# Patient Record
Sex: Female | Born: 1960 | Race: White | Hispanic: No | Marital: Married | Smoking: Never smoker
Health system: Southern US, Community
[De-identification: ages and names within clinical notes are randomized; demographics above are authoritative.]

## PROBLEM LIST (undated history)

## (undated) DIAGNOSIS — J349 Unspecified disorder of nose and nasal sinuses: Secondary | ICD-10-CM

## (undated) DIAGNOSIS — N6019 Diffuse cystic mastopathy of unspecified breast: Secondary | ICD-10-CM

## (undated) DIAGNOSIS — C801 Malignant (primary) neoplasm, unspecified: Secondary | ICD-10-CM

## (undated) DIAGNOSIS — E119 Type 2 diabetes mellitus without complications: Secondary | ICD-10-CM

## (undated) DIAGNOSIS — J45909 Unspecified asthma, uncomplicated: Secondary | ICD-10-CM

## (undated) DIAGNOSIS — Z9221 Personal history of antineoplastic chemotherapy: Secondary | ICD-10-CM

## (undated) DIAGNOSIS — Z464 Encounter for fitting and adjustment of orthodontic device: Secondary | ICD-10-CM

## (undated) DIAGNOSIS — Z803 Family history of malignant neoplasm of breast: Secondary | ICD-10-CM

## (undated) DIAGNOSIS — J9819 Other pulmonary collapse: Secondary | ICD-10-CM

## (undated) DIAGNOSIS — Z800 Family history of malignant neoplasm of digestive organs: Secondary | ICD-10-CM

## (undated) DIAGNOSIS — K7460 Unspecified cirrhosis of liver: Secondary | ICD-10-CM

## (undated) DIAGNOSIS — M1990 Unspecified osteoarthritis, unspecified site: Secondary | ICD-10-CM

## (undated) DIAGNOSIS — C50919 Malignant neoplasm of unspecified site of unspecified female breast: Secondary | ICD-10-CM

## (undated) DIAGNOSIS — E8801 Alpha-1-antitrypsin deficiency: Secondary | ICD-10-CM

## (undated) DIAGNOSIS — IMO0001 Reserved for inherently not codable concepts without codable children: Secondary | ICD-10-CM

## (undated) DIAGNOSIS — T753XXA Motion sickness, initial encounter: Secondary | ICD-10-CM

## (undated) HISTORY — PX: INCISION AND DRAINAGE: SHX5863

## (undated) HISTORY — DX: Malignant (primary) neoplasm, unspecified: C80.1

## (undated) HISTORY — PX: OTHER SURGICAL HISTORY: SHX169

## (undated) HISTORY — PX: BREAST SURGERY: SHX581

## (undated) HISTORY — DX: Diffuse cystic mastopathy of unspecified breast: N60.19

## (undated) HISTORY — DX: Unspecified asthma, uncomplicated: J45.909

## (undated) HISTORY — DX: Family history of malignant neoplasm of digestive organs: Z80.0

## (undated) HISTORY — DX: Other pulmonary collapse: J98.19

## (undated) HISTORY — DX: Type 2 diabetes mellitus without complications: E11.9

## (undated) HISTORY — DX: Family history of malignant neoplasm of breast: Z80.3

## (undated) HISTORY — DX: Malignant neoplasm of unspecified site of unspecified female breast: C50.919

## (undated) HISTORY — DX: Alpha-1-antitrypsin deficiency: E88.01

## (undated) HISTORY — DX: Unspecified osteoarthritis, unspecified site: M19.90

## (undated) HISTORY — DX: Unspecified disorder of nose and nasal sinuses: J34.9

## (undated) HISTORY — PX: CRYOABLATION: SHX1415

---

## 1988-05-12 HISTORY — PX: CHOLECYSTECTOMY: SHX55

## 2000-08-11 ENCOUNTER — Ambulatory Visit (HOSPITAL_COMMUNITY): Admission: RE | Admit: 2000-08-11 | Discharge: 2000-08-11 | Payer: Self-pay | Admitting: Neurology

## 2000-08-11 ENCOUNTER — Encounter: Payer: Self-pay | Admitting: Neurology

## 2001-05-12 HISTORY — PX: BREAST SURGERY: SHX581

## 2002-07-11 HISTORY — PX: BREAST BIOPSY: SHX20

## 2003-05-13 HISTORY — PX: BREAST BIOPSY: SHX20

## 2004-04-19 ENCOUNTER — Ambulatory Visit: Payer: Self-pay | Admitting: Unknown Physician Specialty

## 2004-04-22 ENCOUNTER — Ambulatory Visit: Payer: Self-pay

## 2004-05-02 ENCOUNTER — Ambulatory Visit: Payer: Self-pay | Admitting: Urology

## 2004-05-12 DIAGNOSIS — E119 Type 2 diabetes mellitus without complications: Secondary | ICD-10-CM

## 2004-05-12 DIAGNOSIS — C801 Malignant (primary) neoplasm, unspecified: Secondary | ICD-10-CM

## 2004-05-12 DIAGNOSIS — M1990 Unspecified osteoarthritis, unspecified site: Secondary | ICD-10-CM

## 2004-05-12 HISTORY — DX: Type 2 diabetes mellitus without complications: E11.9

## 2004-05-12 HISTORY — DX: Malignant (primary) neoplasm, unspecified: C80.1

## 2004-05-12 HISTORY — DX: Unspecified osteoarthritis, unspecified site: M19.90

## 2004-05-17 ENCOUNTER — Other Ambulatory Visit: Payer: Self-pay

## 2004-06-11 ENCOUNTER — Inpatient Hospital Stay: Payer: Self-pay | Admitting: Urology

## 2004-07-02 ENCOUNTER — Ambulatory Visit: Payer: Self-pay | Admitting: Urology

## 2004-08-16 ENCOUNTER — Ambulatory Visit: Payer: Self-pay | Admitting: Urology

## 2004-11-20 ENCOUNTER — Ambulatory Visit: Payer: Self-pay | Admitting: Unknown Physician Specialty

## 2005-05-12 DIAGNOSIS — J9819 Other pulmonary collapse: Secondary | ICD-10-CM

## 2005-05-12 HISTORY — DX: Other pulmonary collapse: J98.19

## 2005-06-09 ENCOUNTER — Ambulatory Visit: Payer: Self-pay | Admitting: Urology

## 2005-11-10 ENCOUNTER — Ambulatory Visit: Payer: Self-pay | Admitting: Urology

## 2005-12-04 ENCOUNTER — Ambulatory Visit: Payer: Self-pay

## 2006-04-17 ENCOUNTER — Ambulatory Visit: Payer: Self-pay | Admitting: Urology

## 2006-06-30 ENCOUNTER — Ambulatory Visit: Payer: Self-pay | Admitting: Internal Medicine

## 2006-10-16 ENCOUNTER — Ambulatory Visit: Payer: Self-pay | Admitting: Urology

## 2007-04-19 ENCOUNTER — Ambulatory Visit: Payer: Self-pay | Admitting: Urology

## 2007-05-13 HISTORY — PX: BREAST SURGERY: SHX581

## 2007-06-24 ENCOUNTER — Ambulatory Visit: Payer: Self-pay | Admitting: Internal Medicine

## 2007-07-20 ENCOUNTER — Ambulatory Visit: Payer: Self-pay

## 2007-07-22 ENCOUNTER — Ambulatory Visit: Payer: Self-pay

## 2007-08-23 HISTORY — PX: BREAST BIOPSY: SHX20

## 2008-02-18 ENCOUNTER — Ambulatory Visit: Payer: Self-pay | Admitting: Urology

## 2008-07-19 ENCOUNTER — Ambulatory Visit: Payer: Self-pay | Admitting: Family Medicine

## 2008-08-02 ENCOUNTER — Ambulatory Visit: Payer: Self-pay | Admitting: Family Medicine

## 2008-08-21 ENCOUNTER — Ambulatory Visit: Payer: Self-pay | Admitting: Urology

## 2009-02-21 ENCOUNTER — Ambulatory Visit: Payer: Self-pay | Admitting: Urology

## 2009-02-28 ENCOUNTER — Ambulatory Visit: Payer: Self-pay | Admitting: Urology

## 2009-04-03 ENCOUNTER — Ambulatory Visit: Payer: Self-pay | Admitting: Urology

## 2009-05-24 ENCOUNTER — Ambulatory Visit: Payer: Self-pay | Admitting: Urology

## 2009-07-25 ENCOUNTER — Ambulatory Visit: Payer: Self-pay | Admitting: Internal Medicine

## 2009-09-24 ENCOUNTER — Ambulatory Visit: Payer: Self-pay | Admitting: Urology

## 2010-04-10 ENCOUNTER — Ambulatory Visit: Payer: Self-pay | Admitting: Urology

## 2010-07-29 ENCOUNTER — Ambulatory Visit: Payer: Self-pay | Admitting: Internal Medicine

## 2010-10-14 ENCOUNTER — Ambulatory Visit: Payer: Self-pay | Admitting: Urology

## 2011-04-23 ENCOUNTER — Ambulatory Visit: Payer: Self-pay | Admitting: Urology

## 2011-10-11 HISTORY — PX: BREAST SURGERY: SHX581

## 2011-10-11 HISTORY — PX: CYST REMOVAL NECK: SHX6281

## 2011-10-21 ENCOUNTER — Ambulatory Visit: Payer: Self-pay

## 2011-10-23 ENCOUNTER — Ambulatory Visit: Payer: Self-pay

## 2011-10-29 ENCOUNTER — Ambulatory Visit: Payer: Self-pay | Admitting: Anesthesiology

## 2011-10-29 DIAGNOSIS — I10 Essential (primary) hypertension: Secondary | ICD-10-CM

## 2011-10-31 ENCOUNTER — Ambulatory Visit: Payer: Self-pay | Admitting: Unknown Physician Specialty

## 2011-11-05 ENCOUNTER — Ambulatory Visit: Payer: Self-pay | Admitting: Urology

## 2012-04-27 ENCOUNTER — Ambulatory Visit: Payer: Self-pay | Admitting: General Surgery

## 2012-06-25 DIAGNOSIS — C649 Malignant neoplasm of unspecified kidney, except renal pelvis: Secondary | ICD-10-CM | POA: Insufficient documentation

## 2012-06-25 DIAGNOSIS — N393 Stress incontinence (female) (male): Secondary | ICD-10-CM | POA: Insufficient documentation

## 2012-07-02 ENCOUNTER — Ambulatory Visit: Payer: Self-pay | Admitting: Urology

## 2012-12-29 ENCOUNTER — Ambulatory Visit: Payer: Self-pay | Admitting: Urology

## 2013-08-26 ENCOUNTER — Ambulatory Visit: Payer: Self-pay | Admitting: Gastroenterology

## 2013-08-26 HISTORY — PX: COLONOSCOPY: SHX174

## 2013-09-29 ENCOUNTER — Telehealth: Payer: Self-pay | Admitting: *Deleted

## 2013-09-29 NOTE — Telephone Encounter (Signed)
Pt called and had an appt at [facility] for a mammo and when she got there for her appt she was told she needs to have an order from her dr to do a diagnositc- she stated that Dr.Byrnett had ordered a mammo in the past (2013) she has had many cyst in the past and now has another one that has came up, and she is really worried about it. She was wondering can we order her another mammo and then come in to see Dr.Byrnett.

## 2013-10-11 ENCOUNTER — Encounter: Payer: Self-pay | Admitting: General Surgery

## 2013-10-11 ENCOUNTER — Ambulatory Visit: Payer: Self-pay | Admitting: General Surgery

## 2013-10-12 ENCOUNTER — Encounter: Payer: Self-pay | Admitting: *Deleted

## 2013-10-13 ENCOUNTER — Ambulatory Visit (INDEPENDENT_AMBULATORY_CARE_PROVIDER_SITE_OTHER): Payer: BC Managed Care – PPO | Admitting: General Surgery

## 2013-10-13 ENCOUNTER — Other Ambulatory Visit: Payer: Self-pay | Admitting: General Surgery

## 2013-10-13 ENCOUNTER — Encounter: Payer: Self-pay | Admitting: General Surgery

## 2013-10-13 ENCOUNTER — Ambulatory Visit: Payer: BC Managed Care – PPO

## 2013-10-13 VITALS — BP 130/78 | HR 76 | Resp 12 | Ht 66.0 in | Wt 198.0 lb

## 2013-10-13 DIAGNOSIS — N63 Unspecified lump in unspecified breast: Secondary | ICD-10-CM

## 2013-10-13 NOTE — Progress Notes (Signed)
Patient ID: Danielle Wallace, female   DOB: [birth year], 53 y.o.   MRN: [id number]  Chief Complaint  Patient presents with  . Other    mammogram    HPI Danielle Wallace is a 53 y.o. female who presents for a breast evaluation. The most recent mammogram was done on 10/11/13. Patient states she feels lump in her left breast.She states the she noticed the area about two months ago and thinks it is getting bigger. There area is tender to touch but no pain.Patient does perform regular self breast checks and does not  gets regular mammograms done.   The patient was last seen in this office in January 2014 having undergone aspiration of large symptomatic cyst of the left breast in June of the following year. Cytology of those samples have been negative.    HPI  Past Medical History  Diagnosis Date  . Asthma   . Collapsed lung 2007  . Diffuse cystic mastopathy   . Arthritis 2006  . Diabetes mellitus without complication 6440  . Sinus problem   . Cancer 2006    Renal cell carcinoma; cryosurgery treatment    Past Surgical History  Procedure Laterality Date  . Cesarean section  1991  . Cyst removal neck  10/2011    Dr. Tami Ribas  . Cholecystectomy  1990  . Cryoablation  2005, 2010  . Breast surgery Left 10/2011    Cyst Aspirationapocrine metaplasia, ductal cells and bone cells, hypo-cellular  . Breast surgery Left 2009    ADH on stereotactic biopsy.  . Breast surgery Left 2003    fibrocystic changes with ductal hyperplasia without atypia.    Family History  Problem Relation Age of Onset  . Breast cancer Paternal Aunt   . Ovarian cancer Maternal Grandmother   . Liver cancer Father   . Diabetes Mother   . Hypertension Mother     Social History History  Substance Use Topics  . Smoking status: Never Smoker   . Smokeless tobacco: Never Used  . Alcohol Use: Yes     Comment: occasionally    Allergies  Allergen Reactions  . Floxin [Ofloxacin] Other (See Comments)    systemic  .  Hydrocodone Itching  . Sulfa Antibiotics Other (See Comments)    blisters  . Penicillins Rash    Current Outpatient Prescriptions  Medication Sig Dispense Refill  . albuterol (PROVENTIL HFA;VENTOLIN HFA) 108 (90 BASE) MCG/ACT inhaler Inhale 2 puffs into the lungs every 6 (six) hours as needed for wheezing or shortness of breath.      . Canagliflozin (INVOKANA) 300 MG TABS Take 1 tablet by mouth daily.      . fluticasone (VERAMYST) 27.5 MCG/SPRAY nasal spray Place 2 sprays into the nose daily.      Marland Kitchen imipramine (TOFRANIL) 25 MG tablet Take 25 mg by mouth at bedtime.      . meloxicam (MOBIC) 7.5 MG tablet Take 7.5 mg by mouth 2 (two) times daily.      . sitaGLIPtin (JANUVIA) 50 MG tablet Take 50 mg by mouth daily.      . valsartan-hydrochlorothiazide (DIOVAN-HCT) 80-12.5 MG per tablet Take 1 tablet by mouth daily.       No current facility-administered medications for this visit.    Review of Systems Review of Systems  Constitutional: Negative.   Respiratory: Negative.   Cardiovascular: Negative.     Blood pressure 130/78, pulse 76, resp. rate 12, height 5' 6"  (1.676 m), weight 198 lb (89.812 kg).  Physical  Exam Physical Exam  Constitutional: She is oriented to person, place, and time. She appears well-developed and well-nourished.  Eyes: Conjunctivae are normal. No scleral icterus.  Neck: Neck supple.  Cardiovascular: Normal rate, regular rhythm and normal heart sounds.   Pulmonary/Chest: Breath sounds normal. Right breast exhibits no inverted nipple, no mass, no nipple discharge, no skin change and no tenderness. Left breast exhibits mass ( left breast 2 o'clcock 9 cm from nipple 1cm smooth nodular, 12 o'clcok 4cm thickening ). Left breast exhibits no inverted nipple, no nipple discharge, no skin change and no tenderness. Breasts are asymmetrical (left breast half cup sizes smaller than right ).  Lymphadenopathy:    She has no cervical adenopathy.    She has no axillary  adenopathy.  Neurological: She is alert and oriented to person, place, and time.  Skin: Skin is warm and dry.    Data Reviewed REASON FOR EXAM:    LT BR CYST AND YRLY COMMENTS:     PROCEDURE: MAM - MAM [place] LIMITED LEFT BREAST  - Oct 11 2013  3:35PM   CLINICAL DATA:  The patient questions palpable masses left breast 12 o'clock location and upper-outer quadrant, respectively. Previous left cyst aspiration.  EXAM: DIGITAL DIAGNOSTIC  bilateral MAMMOGRAM WITH CAD  ULTRASOUND left BREAST  COMPARISON:  Prior exams ACR Breast Density Category b: There are scattered areas of fibroglandular density.  FINDINGS: No new suspicious abnormality is identified in the left breast. The previous identified cyst in the left upper outer quadrant is no longer identified, there is new masslike distortion in the left breast 2 o'clock location at the site of the more posterior questioned palpable finding.  Mammographic images were processed with CAD.  On physical exam, I palpate masslike thickening in the left breast 2 o'clock location. I palpate a probable ridge of tissue in the left breast 12 o'clock location middle depth. Ultrasound is performed, showing :  Irregular shadowing hypoechoic mass left breast 2 o'clock location 6 cm measuring 1.1 x 1.1 x 1.0 cm.  In the left breast 12 o'clock location, no sonographic abnormality is identified at the site of the questioned palpable finding.  No left axillary lymphadenopathy is identified.   IMPRESSION: Suspicious mass left breast 2 o'clock location at the site of the patient's questioned palpable finding. The primary differential consideration includes invasive mammary carcinoma, although scar tissue could conceivably appear similar but is less likely in the setting of needle aspiration of a simple appearing cyst previously.  No corresponding suspicious abnormality in the left breast 12 o'clock location. Any further management of this area  should be dictated by clinical assessment.  No evidence for malignancy in the right breast.  RECOMMENDATION: Left ultrasound-guided core biopsy, 2 o'clock location.  I discussed these findings with the patient and provided them in written form and also called these findings to Dr. Bary Castilla on 10/11/2013 at 3:35 p.m.. His office will contact the patient to arrange for further followup. I have discussed the findings and recommendations with the patient. Results were also provided in writing at the conclusion of the visit. If applicable, a reminder letter will be sent to the patient regarding the next appointment.  BI-RADS CATEGORY  4: Suspicious.   Electronically Signed   By: Conchita Paris M.D.   On: 10/11/2013 16:24     Assessment     New left breast mass.     Plan    Considering the marked interval change both on mammogram and ultrasound vacuum-assisted biopsy  was recommended.  Ultrasound of the right breast in the 2 o'clock position 8 cm from the nipple confirmed a 1.0 x 1.04 x 1.11 cm hypoechoic mass with dense acoustic shadowing at the area of palpable thickening. 10 cc of 0.5% Xylocaine with 0.25% Marcaine with 1-200,000 units of epinephrine was instilled well-tolerated. ChloraPrep was applied to the skin. A 10-gauge Encor device was used with 9 core samples obtained. Scant bleeding was noted. A postbiopsy clip was placed. Skin defect was closed with benzoin and Steri-Strips followed by Telfa and Tegaderm dressing.  Written instructions were provided for postbiopsy wound care.  The patient will be contacted when biopsy results are available and further plans made from that point.     PCP: Clayborn Bigness Kirke Shaggy Kiara Mcdowell 10/14/2013, 8:34 PM

## 2013-10-13 NOTE — Patient Instructions (Addendum)
CARE AFTER BREAST BIOPSY  1. Leave the dressing on that your doctor applied after surgery. It is waterproof. You may bathe, shower and/or swim. The dressing will probably remain intact until your return office visit. If the dressing comes off, you will see small strips of tape against your skin on the incision. Do not remove these strips.  2. You may want to use a gauze,cloth or similar protection in your bra to prevent rubbing against your dressing and incision. This is not necessary, but you may feel more comfortable doing so.  3. It is recommended that you wear a bra day and night to give support to the breast. This will prevent the weight of the breast from pulling on the incision.  4. Your breast will feel hard and lumpy under the incision. Do not be alarmed. This is the underlying stitching of tissue. Softening of this tissue will occur in time.  5. Make sure you call the office and schedule an appointment in one week after your surgery. The office phone number is [phone]. The nurses at [facility] may have already done this for you.  6. You will notice about a week after your office visit that the strips of the tape on your incision will begin to loosen. These may then be removed.  7. Report to your doctor any of the following:  * Severe pain not relieved by your pain medication  *Redness of the incision  * Drainage from the incision  *Fever greater than 101 degrees 1 week nurse

## 2013-10-14 ENCOUNTER — Encounter: Payer: Self-pay | Admitting: General Surgery

## 2013-10-14 ENCOUNTER — Telehealth: Payer: Self-pay | Admitting: General Surgery

## 2013-10-14 ENCOUNTER — Other Ambulatory Visit: Payer: Self-pay

## 2013-10-14 DIAGNOSIS — C50919 Malignant neoplasm of unspecified site of unspecified female breast: Secondary | ICD-10-CM

## 2013-10-14 DIAGNOSIS — C50412 Malignant neoplasm of upper-outer quadrant of left female breast: Secondary | ICD-10-CM | POA: Insufficient documentation

## 2013-10-14 DIAGNOSIS — Z170 Estrogen receptor positive status [ER+]: Secondary | ICD-10-CM

## 2013-10-14 DIAGNOSIS — N63 Unspecified lump in unspecified breast: Secondary | ICD-10-CM | POA: Insufficient documentation

## 2013-10-14 HISTORY — DX: Malignant neoplasm of unspecified site of unspecified female breast: C50.919

## 2013-10-14 NOTE — Progress Notes (Signed)
Patient ID: Danielle Wallace, female   DOB: [birth year], 53 y.o.   MRN: [id number]   The patient returns today accompanied by her husband to review results of her biopsy completed yesterday. She had been notified by phone that the cancer had been identified.  She reports minimal discomfort from the biopsy site and no significant bruising.  She brought with her today copy of her recent laboratory studies. These were completed Sep 20, 2013.  Renal function is preserved with a creatinine of 0.7. Liver function studies are notable for a mild elevation of the SGPT at 45 (0-32). Remaining liver function studies are normal. Hemoglobin A1c was mildly elevated at 6.2%. Normal vitamin D, normal TSH. Normal cholesterol. CBC showed a hemoglobin of 15.7 with MCV of 89 and white blood cell count 7300.  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 Review of her medical record shows multiple previous cyst aspirations and in 2009 and isolated biopsy showing ADH. At that time the estimated wrist reduction with the use of chemoprevention was 1.4% which was not felt to be an adequate improvement to initiate tamoxifen.  Breast conservation and mastectomy were presented is equivalent procedures in terms of local control. The indications for adjuvant chemotherapy would be based on final tumor size, lymph node status and likely Oncotype DX testing.  The patient expressed an interest in mastectomy with immediate reconstruction, based on the number of previous cyst aspiration she is undergone and a concern that each time she felt a new lesion she will be worried about recurrent cancer.  Informational brochures were provided regarding breast reconstruction and she was encouraged to sit down with the plastic surgeon to review options that white be particularly appropriate for her in light of her diabetes and modest obesity.  A general  informational brochure regarding breast cancer treatment was provided.  The patient has a planned trip to [place] scheduled for June 21-24 of this year. I think is very reasonable for her to plan on taking this trip. Ideally, arrangements will made for plastic surgery evaluation before her travels and then surgical therapy shortly after her return.  She will obtain a CEA and CA 27-29 today.  She was encouraged to call with any questions.  She was offered the opportunity for second surgical opinion and to meet with medical oncology/radiation oncology if she felt this would be appropriate to her situation. At this time, she declines.  We'll work to arrange for plastic surgery consultation in the near future.

## 2013-10-14 NOTE — Telephone Encounter (Signed)
She was notified to yesterday's biopsy confirmed a breast cancer. Arrangements are in place to meet at 3 PM today to review options.

## 2013-10-15 LAB — CEA: CEA: 1.8 ng/mL (ref 0.0–4.7)

## 2013-10-15 LAB — CANCER ANTIGEN 27.29: CA 27.29: 29.7 U/mL (ref 0.0–38.6)

## 2013-10-16 ENCOUNTER — Encounter: Payer: Self-pay | Admitting: General Surgery

## 2013-10-16 ENCOUNTER — Telehealth: Payer: Self-pay | Admitting: General Surgery

## 2013-10-16 NOTE — Telephone Encounter (Signed)
Message left that CEA and CA 27-29 were normal.

## 2013-10-20 ENCOUNTER — Ambulatory Visit (INDEPENDENT_AMBULATORY_CARE_PROVIDER_SITE_OTHER): Payer: BC Managed Care – PPO | Admitting: *Deleted

## 2013-10-20 ENCOUNTER — Ambulatory Visit: Payer: BC Managed Care – PPO

## 2013-10-20 DIAGNOSIS — C50419 Malignant neoplasm of upper-outer quadrant of unspecified female breast: Secondary | ICD-10-CM

## 2013-10-20 DIAGNOSIS — C50412 Malignant neoplasm of upper-outer quadrant of left female breast: Secondary | ICD-10-CM

## 2013-10-20 NOTE — Patient Instructions (Signed)
The patient is aware to call back for any questions or concerns.  

## 2013-10-20 NOTE — Progress Notes (Signed)
Patient here today for follow up post left breast biopsy.  No dressing or steristrip.  Minimal bruising noted.  The patient is aware that a heating pad may be used for comfort as needed.  She had her appointment with Dr. Tula Nakayama today and wants to talk to Dr. Bary Castilla about surgical options (? bilateral mastectomy with reconstruction). She is possibly wanting to have surgery after her trip to [place] (21-24) so this would tentatively be the first part of July. I told her we would be in touch with her regarding an appointment with Dr Bary Castilla next week.

## 2013-10-21 LAB — PATHOLOGY

## 2013-10-24 ENCOUNTER — Telehealth: Payer: Self-pay | Admitting: General Surgery

## 2013-10-24 HISTORY — PX: BREAST BIOPSY: SHX20

## 2013-10-24 NOTE — Telephone Encounter (Signed)
Reviewed discussion at this evenings tumor board meeting. Because of her 2 + tumor, patient will likely benefit from adjuvant chemo.  She does not qualify for protocol treatment for her 2 + tumor as it is less than 2 cm, but she might be considered for treatment off protocol.  Will arrange an appt w/ Dr. Oliva Bustard from Med Onc this week, and an OV after her return from [facility] to discuss chemo and roll of prophylactic mastectomy (discouraged based on current data).

## 2013-10-25 ENCOUNTER — Telehealth: Payer: Self-pay

## 2013-10-25 NOTE — Telephone Encounter (Signed)
Message copied by Lesly Rubenstein on Tue Oct 25, 2013  8:37 AM ------      Message from: [place], Forest Gleason      Created: Mon Oct 24, 2013  7:07 PM       Please make arrangements for the patient to meet with Dr. Oliva Bustard this week re: HER 2 + breast cancer, and with me next week to discuss options for treatment and bilateral mastectomy. Thanks ------

## 2013-10-25 NOTE — Telephone Encounter (Signed)
Spoke with patient about seeing Dr Oliva Bustard this week, patient is amendable to this. Patient is schedule to see Dr Oliva Bustard at [facility] on 10/27/13 at 8:00 am. Patient is aware of date and time.

## 2013-10-27 ENCOUNTER — Ambulatory Visit: Payer: Self-pay | Admitting: Oncology

## 2013-10-27 ENCOUNTER — Telehealth: Payer: Self-pay | Admitting: *Deleted

## 2013-10-27 ENCOUNTER — Encounter: Payer: Self-pay | Admitting: *Deleted

## 2013-10-27 LAB — COMPREHENSIVE METABOLIC PANEL
ALBUMIN: 3.8 g/dL (ref 3.4–5.0)
ALT: 43 U/L (ref 12–78)
ANION GAP: 6 — AB (ref 7–16)
Alkaline Phosphatase: 78 U/L
BILIRUBIN TOTAL: 0.5 mg/dL (ref 0.2–1.0)
BUN: 21 mg/dL — AB (ref 7–18)
CHLORIDE: 101 mmol/L (ref 98–107)
Calcium, Total: 9.6 mg/dL (ref 8.5–10.1)
Co2: 35 mmol/L — ABNORMAL HIGH (ref 21–32)
Creatinine: 0.65 mg/dL (ref 0.60–1.30)
EGFR (Non-African Amer.): >60
GLUCOSE: 115 mg/dL — AB (ref 65–99)
Osmolality: 287 (ref 275–301)
Potassium: 4.1 mmol/L (ref 3.5–5.1)
SGOT(AST): 26 U/L (ref 15–37)
Sodium: 142 mmol/L (ref 136–145)
Total Protein: 7.6 g/dL (ref 6.4–8.2)

## 2013-10-27 LAB — CBC CANCER CENTER
Basophil #: 0 x10 3/mm (ref 0.0–0.1)
Basophil %: 0.6 %
EOS PCT: 2.2 %
Eosinophil #: 0.1 x10 3/mm (ref 0.0–0.7)
HCT: 46.3 % (ref 35.0–47.0)
HGB: 15.4 g/dL (ref 12.0–16.0)
LYMPHS ABS: 2 x10 3/mm (ref 1.0–3.6)
Lymphocyte %: 30.7 %
MCH: 29.7 pg (ref 26.0–34.0)
MCHC: 33.2 g/dL (ref 32.0–36.0)
MCV: 89 fL (ref 80–100)
Monocyte #: 0.5 x10 3/mm (ref 0.2–0.9)
Monocyte %: 7.9 %
NEUTROS ABS: 3.8 x10 3/mm (ref 1.4–6.5)
NEUTROS PCT: 58.6 %
PLATELETS: 256 x10 3/mm (ref 150–440)
RBC: 5.18 10*6/uL (ref 3.80–5.20)
RDW: 13.1 % (ref 11.5–14.5)
WBC: 6.5 x10 3/mm (ref 3.6–11.0)

## 2013-10-27 NOTE — Progress Notes (Signed)
Patient ID: Danielle Wallace, female   DOB: 1961/01/17, 53 y.o.   MRN: [id number] Patient came by today to let Dr. Bary Castilla known her surgery wishes/plans. She has talked with Dr Oliva Bustard and Dr Tula Nakayama and she wants a simple mastectomy. She states she will need a port placed as well. Her chemotherapy class is 11-03-13. She will need chemotherapy post surgery. Dr Tula Nakayama plans reconstructive surgery at time of mastectomy. Caryl-lyn will call Dr Tula Nakayama office to tentative set date for 11-24-13. Patient wishes it was sooner. Follow up appointment with Dr Bary Castilla is as scheduled 11-08-13.

## 2013-10-27 NOTE — Telephone Encounter (Signed)
She called wanting to follow up on patient status and if biopsy had been completed. Aware it had been completed on 10-13-13.

## 2013-11-02 ENCOUNTER — Encounter: Payer: Self-pay | Admitting: General Surgery

## 2013-11-03 ENCOUNTER — Telehealth: Payer: Self-pay | Admitting: *Deleted

## 2013-11-03 NOTE — Telephone Encounter (Signed)
Patient's left breast mastectomy with SLN biopsy and right power port placement has been scheduled for 11-24-13 at [facility].  Instructions will need to be reviewed with the patient at time of pre-op visit on Tuesday, 11-08-13.   Patient is aware of surgery date per phone conversation earlier today.   Message has been left for Roselyn Reef at Dr. Edwin Dada office to call back regarding surgery scheduling. We are coordinating breast reconstruction at time of mastectomy and port placement. Roselyn Reef is on vacation and will return next Wednesday, 11-09-13. The patient is okay for phone interview per Dr. Bary Castilla and we need to make sure this is okay with Dr. Tula Nakayama as well. Phone interview presently scheduled for 11-16-13.  Per Santiago Glad in scheduling, patient will require a Beta HCG serum pregnancy test per Nuc Med protocol even though patient reports last menstrual period was February 2014. This will need to be on Dr. Dwyane Luo surgical orders.

## 2013-11-08 ENCOUNTER — Encounter: Payer: Self-pay | Admitting: General Surgery

## 2013-11-08 ENCOUNTER — Ambulatory Visit (INDEPENDENT_AMBULATORY_CARE_PROVIDER_SITE_OTHER): Payer: BC Managed Care – PPO | Admitting: General Surgery

## 2013-11-08 VITALS — BP 120/68 | HR 70 | Resp 12 | Ht 66.0 in | Wt 196.0 lb

## 2013-11-08 DIAGNOSIS — C50419 Malignant neoplasm of upper-outer quadrant of unspecified female breast: Secondary | ICD-10-CM

## 2013-11-08 DIAGNOSIS — C50412 Malignant neoplasm of upper-outer quadrant of left female breast: Secondary | ICD-10-CM

## 2013-11-08 NOTE — Patient Instructions (Signed)
Patient's surgery has been scheduled for 11-24-13 at [facility].

## 2013-11-08 NOTE — Progress Notes (Signed)
Patient ID: Danielle Wallace, female   DOB: 02/11/1961, 53 y.o.   MRN: [id number]  Chief Complaint  Patient presents with  . Other    dicuss breast care treatment    HPI Danielle Wallace is a 53 y.o. female here today to discuss breast care treatment. The patient has had an opportunity to meet with both the plastic surgery service and medical oncology. She has elected to proceed with immediate reconstruction after a planned left nipple sparing mastectomy. As she has been identified as a HER-2 positive cancer, adjuvant chemotherapy post surgery has been recommended necessitating power port placement. The patient is accompanied today by her husband. HPI  Past Medical History  Diagnosis Date  . Asthma   . Collapsed lung 2007  . Diffuse cystic mastopathy   . Arthritis 2006  . Diabetes mellitus without complication 1025  . Sinus problem   . Cancer 2006    Renal cell carcinoma; cryosurgery treatment    Past Surgical History  Procedure Laterality Date  . Cesarean section  1991  . Cyst removal neck  10/2011    Dr. Tami Ribas  . Cholecystectomy  1990  . Cryoablation  2005, 2010  . Breast surgery Left 10/2011    Cyst Aspirationapocrine metaplasia, ductal cells and bone cells, hypo-cellular  . Breast surgery Left 2009    ADH on stereotactic biopsy, 1.5 mm focus.  . Breast surgery Left 2003    fibrocystic changes with ductal hyperplasia without atypia.    Family History  Problem Relation Age of Onset  . Breast cancer Paternal Aunt   . Ovarian cancer Maternal Grandmother   . Liver cancer Father   . Diabetes Mother   . Hypertension Mother     Social History History  Substance Use Topics  . Smoking status: Never Smoker   . Smokeless tobacco: Never Used  . Alcohol Use: Yes     Comment: occasionally    Allergies  Allergen Reactions  . Floxin [Ofloxacin] Other (See Comments)    systemic  . Hydrocodone Itching  . Sulfa Antibiotics Other (See Comments)    blisters  . Penicillins  Rash    Current Outpatient Prescriptions  Medication Sig Dispense Refill  . albuterol (PROVENTIL HFA;VENTOLIN HFA) 108 (90 BASE) MCG/ACT inhaler Inhale 2 puffs into the lungs every 6 (six) hours as needed for wheezing or shortness of breath.      . Canagliflozin (INVOKANA) 300 MG TABS Take 1 tablet by mouth daily.      . fluticasone (VERAMYST) 27.5 MCG/SPRAY nasal spray Place 2 sprays into the nose daily.      Marland Kitchen imipramine (TOFRANIL) 25 MG tablet Take 25 mg by mouth at bedtime.      Marland Kitchen levocetirizine (XYZAL) 5 MG tablet Take 5 mg by mouth every evening.      . meloxicam (MOBIC) 7.5 MG tablet Take 7.5 mg by mouth 2 (two) times daily.      . metFORMIN (GLUCOPHAGE) 500 MG tablet Take 500 mg by mouth QID.      Marland Kitchen omeprazole (PRILOSEC) 40 MG capsule Take 40 mg by mouth as needed.      . sitaGLIPtin (JANUVIA) 50 MG tablet Take 50 mg by mouth daily.      . valsartan-hydrochlorothiazide (DIOVAN-HCT) 80-12.5 MG per tablet Take 1 tablet by mouth daily.       No current facility-administered medications for this visit.    Review of Systems Review of Systems  Constitutional: Negative.   Respiratory: Negative.   Cardiovascular:  Negative.     Blood pressure 120/68, pulse 70, resp. rate 12, height 5' 6"  (1.676 m), weight 196 lb (88.905 kg).  Physical Exam Physical Exam  Constitutional: She is oriented to person, place, and time. She appears well-developed and well-nourished.  Eyes: Conjunctivae are normal. No scleral icterus.  Neck: Neck supple.  Cardiovascular: Normal rate, regular rhythm and normal heart sounds.   Pulmonary/Chest: Effort normal and breath sounds normal. Right breast exhibits no inverted nipple, no mass, no nipple discharge, no skin change and no tenderness. Left breast exhibits mass. Left breast exhibits no inverted nipple, no nipple discharge, no skin change and no tenderness.  Left breast 3 o'clock 2 cm mass  Abdominal: Soft. Bowel sounds are normal. There is no tenderness.   Neurological: She is alert and oriented to person, place, and time.  Skin: Skin is warm and dry.    Data Reviewed Medical oncology and plastic surgery notes were reviewed.  Assessment    Left breast cancer.    Plan    The plans for surgical intervention have been reviewed. The patient comfortable with the present treatment outlook. The risks associated with power port placement including those of bleeding and lung injury were reviewed.    Patient's surgery has been scheduled for 11-24-13 at [facility]. Dr. Nicholaus Bloom will be completing reconstruction at the time of left breast mastectomy and power port placement. Please see separate orders from Dr. Tula Nakayama for his part of the surgery.  PCP: Lonna Duval 11/09/2013, 7:32 AM

## 2013-11-09 ENCOUNTER — Other Ambulatory Visit: Payer: Self-pay | Admitting: General Surgery

## 2013-11-09 ENCOUNTER — Ambulatory Visit: Payer: Self-pay | Admitting: Oncology

## 2013-11-09 DIAGNOSIS — C50412 Malignant neoplasm of upper-outer quadrant of left female breast: Secondary | ICD-10-CM

## 2013-11-10 LAB — HER-2 / NEU, FISH
AVG NUM HER-2 SIGNALS/NUCLEUS: 3.8
Avg Num CEP17 probes/nucleus:: 1.7
HER-2/CEP17 RATIO: 2.19
Number of Observers:: 2
Number of Tumor Cells Counted:: 40

## 2013-11-10 LAB — IMMUNOHISTOCHEMICAL STAIN(S)

## 2013-11-14 ENCOUNTER — Encounter: Payer: Self-pay | Admitting: General Surgery

## 2013-11-24 ENCOUNTER — Encounter: Payer: Self-pay | Admitting: General Surgery

## 2013-11-24 ENCOUNTER — Ambulatory Visit: Payer: Self-pay | Admitting: General Surgery

## 2013-11-24 DIAGNOSIS — C50419 Malignant neoplasm of upper-outer quadrant of unspecified female breast: Secondary | ICD-10-CM

## 2013-11-24 HISTORY — PX: PORTACATH PLACEMENT: SHX2246

## 2013-11-24 HISTORY — PX: MASTECTOMY: SHX3

## 2013-11-24 LAB — URINALYSIS, COMPLETE
BLOOD: NEGATIVE
Bacteria: NONE SEEN
Bilirubin,UR: NEGATIVE
Glucose,UR: >=500 mg/dL (ref 0–75)
Ketone: NEGATIVE
Leukocyte Esterase: NEGATIVE
NITRITE: NEGATIVE
Ph: 5 (ref 4.5–8.0)
Protein: NEGATIVE
RBC,UR: 1 /HPF (ref 0–5)
Specific Gravity: 1.031 (ref 1.003–1.030)
Squamous Epithelial: 1

## 2013-11-25 LAB — PATHOLOGY REPORT

## 2013-11-28 ENCOUNTER — Telehealth: Payer: Self-pay | Admitting: General Surgery

## 2013-11-28 NOTE — Telephone Encounter (Signed)
Notified path report was fine. All margins clear. Sentinel node negative. Retroareolar tissue negative. Except for being tired she reports doing well.

## 2013-11-29 ENCOUNTER — Encounter: Payer: Self-pay | Admitting: General Surgery

## 2013-12-06 LAB — COMPREHENSIVE METABOLIC PANEL
ALK PHOS: 88 U/L
ANION GAP: 6 — AB (ref 7–16)
AST: 27 U/L (ref 15–37)
Albumin: 3.5 g/dL (ref 3.4–5.0)
BUN: 20 mg/dL — AB (ref 7–18)
Bilirubin,Total: 0.3 mg/dL (ref 0.2–1.0)
CALCIUM: 9.4 mg/dL (ref 8.5–10.1)
Chloride: 102 mmol/L (ref 98–107)
Co2: 31 mmol/L (ref 21–32)
Creatinine: 0.7 mg/dL (ref 0.60–1.30)
EGFR (Non-African Amer.): >60
GLUCOSE: 148 mg/dL — AB (ref 65–99)
Osmolality: 283 (ref 275–301)
Potassium: 3.7 mmol/L (ref 3.5–5.1)
SGPT (ALT): 42 U/L
Sodium: 139 mmol/L (ref 136–145)
TOTAL PROTEIN: 7.7 g/dL (ref 6.4–8.2)

## 2013-12-06 LAB — CBC CANCER CENTER
BASOS ABS: 0 x10 3/mm (ref 0.0–0.1)
Basophil %: 0.4 %
Eosinophil #: 0.4 x10 3/mm (ref 0.0–0.7)
Eosinophil %: 4 %
HCT: 44.2 % (ref 35.0–47.0)
HGB: 14.6 g/dL (ref 12.0–16.0)
LYMPHS PCT: 25.3 %
Lymphocyte #: 2.3 x10 3/mm (ref 1.0–3.6)
MCH: 30 pg (ref 26.0–34.0)
MCHC: 33 g/dL (ref 32.0–36.0)
MCV: 91 fL (ref 80–100)
Monocyte #: 0.7 x10 3/mm (ref 0.2–0.9)
Monocyte %: 7.4 %
Neutrophil #: 5.7 x10 3/mm (ref 1.4–6.5)
Neutrophil %: 62.9 %
Platelet: 309 x10 3/mm (ref 150–440)
RBC: 4.86 10*6/uL (ref 3.80–5.20)
RDW: 13.2 % (ref 11.5–14.5)
WBC: 9.1 x10 3/mm (ref 3.6–11.0)

## 2013-12-10 ENCOUNTER — Ambulatory Visit: Payer: Self-pay | Admitting: Oncology

## 2013-12-14 ENCOUNTER — Other Ambulatory Visit (INDEPENDENT_AMBULATORY_CARE_PROVIDER_SITE_OTHER): Payer: BC Managed Care – PPO

## 2013-12-14 ENCOUNTER — Encounter: Payer: Self-pay | Admitting: General Surgery

## 2013-12-14 ENCOUNTER — Ambulatory Visit: Payer: BC Managed Care – PPO | Admitting: General Surgery

## 2013-12-14 VITALS — BP 124/70 | HR 76 | Resp 12 | Ht 66.0 in | Wt 194.0 lb

## 2013-12-14 DIAGNOSIS — C50419 Malignant neoplasm of upper-outer quadrant of unspecified female breast: Secondary | ICD-10-CM

## 2013-12-14 DIAGNOSIS — C50412 Malignant neoplasm of upper-outer quadrant of left female breast: Secondary | ICD-10-CM

## 2013-12-14 NOTE — Progress Notes (Signed)
Patient ID: Danielle Wallace, female   DOB: 1961/04/04, 53 y.o.   MRN: [id number]  Chief Complaint  Patient presents with  . Follow-up    port placement    HPI Danielle Wallace is a 53 y.o. female here today for her follow up port placement and  Left sparing mastectomy done on 11/24/13. Patient states she is doing well. Patient starts chemo on 12/20/13. HPI  Past Medical History  Diagnosis Date  . Asthma   . Collapsed lung 2007  . Diffuse cystic mastopathy   . Arthritis 2006  . Diabetes mellitus without complication 3532  . Sinus problem   . Cancer 2006    Renal cell carcinoma; cryosurgery treatment    Past Surgical History  Procedure Laterality Date  . Cesarean section  1991  . Cyst removal neck  10/2011    Dr. Tami Ribas  . Cholecystectomy  1990  . Cryoablation  2005, 2010  . Portacath placement  11/24/13  . Breast surgery Left 10/2011    Cyst Aspirationapocrine metaplasia, ductal cells and bone cells, hypo-cellular  . Breast surgery Left 2009    ADH on stereotactic biopsy, 1.5 mm focus.  . Breast surgery Left 2003    fibrocystic changes with ductal hyperplasia without atypia.  . Breast surgery Left     mastectomy    Family History  Problem Relation Age of Onset  . Breast cancer Paternal Aunt   . Ovarian cancer Maternal Grandmother   . Liver cancer Father   . Diabetes Mother   . Hypertension Mother     Social History History  Substance Use Topics  . Smoking status: Never Smoker   . Smokeless tobacco: Never Used  . Alcohol Use: Yes     Comment: occasionally    Allergies  Allergen Reactions  . Floxin [Ofloxacin] Other (See Comments)    systemic  . Hydrocodone Itching  . Sulfa Antibiotics Other (See Comments)    blisters  . Penicillins Rash    Current Outpatient Prescriptions  Medication Sig Dispense Refill  . albuterol (PROVENTIL HFA;VENTOLIN HFA) 108 (90 BASE) MCG/ACT inhaler Inhale 2 puffs into the lungs every 6 (six) hours as needed for wheezing or  shortness of breath.      . Canagliflozin (INVOKANA) 300 MG TABS Take 1 tablet by mouth daily.      . fluticasone (VERAMYST) 27.5 MCG/SPRAY nasal spray Place 2 sprays into the nose daily.      Marland Kitchen imipramine (TOFRANIL) 25 MG tablet Take 25 mg by mouth at bedtime.      Marland Kitchen levocetirizine (XYZAL) 5 MG tablet Take 5 mg by mouth every evening.      . meloxicam (MOBIC) 7.5 MG tablet Take 7.5 mg by mouth 2 (two) times daily.      . metFORMIN (GLUCOPHAGE) 500 MG tablet Take 500 mg by mouth QID.      Marland Kitchen omeprazole (PRILOSEC) 40 MG capsule Take 40 mg by mouth as needed.      . sitaGLIPtin (JANUVIA) 50 MG tablet Take 50 mg by mouth daily.      . valsartan-hydrochlorothiazide (DIOVAN-HCT) 80-12.5 MG per tablet Take 1 tablet by mouth daily.       No current facility-administered medications for this visit.    Review of Systems Review of Systems  Constitutional: Negative.   Respiratory: Negative.   Cardiovascular: Negative.     Blood pressure 124/70, pulse 76, resp. rate 12, height 5' 6"  (1.676 m), weight 194 lb (87.998 kg).  Physical Exam Physical  Exam  Constitutional: She is oriented to person, place, and time. She appears well-developed and well-nourished.  Pulmonary/Chest:    Drainage 1 oz of fluid  Neurological: She is alert and oriented to person, place, and time.  Skin: Skin is warm and dry.    Data Reviewed Ultrasound examination showed a 6 mm deep but I 6 mm wide area of fluid between the breast flap and the underlying pectoralis muscle. It was elected to complete aspiration to improve flap adherence and to prevent further compromise of the nipple/areola blood flow. ChloraPrep was applied followed by 1 cc of 1% plain Xylocaine. 32 cc was aspirated. The procedure was well tolerated.  Assessment    Small seroma post skin sparing mastectomy with immediate reconstruction.    Plan    The patient will follow up as scheduled next week with plastic surgery. Followup here  in 3 months.      PCP: Lonna Duval 12/16/2013, 10:18 AM

## 2013-12-14 NOTE — Patient Instructions (Signed)
Patient to return in 2 months.

## 2013-12-23 LAB — CBC CANCER CENTER
Basophil #: 0 x10 3/mm (ref 0.0–0.1)
Basophil %: 0.2 %
Eosinophil #: 0.2 x10 3/mm (ref 0.0–0.7)
Eosinophil %: 0.9 %
HCT: 44.8 % (ref 35.0–47.0)
HGB: 14.8 g/dL (ref 12.0–16.0)
LYMPHS ABS: 1.9 x10 3/mm (ref 1.0–3.6)
Lymphocyte %: 10 %
MCH: 29.7 pg (ref 26.0–34.0)
MCHC: 33 g/dL (ref 32.0–36.0)
MCV: 90 fL (ref 80–100)
MONO ABS: 0.3 x10 3/mm (ref 0.2–0.9)
Monocyte %: 1.6 %
Neutrophil #: 16.5 x10 3/mm — ABNORMAL HIGH (ref 1.4–6.5)
Neutrophil %: 87.3 %
Platelet: 182 x10 3/mm (ref 150–440)
RBC: 4.98 10*6/uL (ref 3.80–5.20)
RDW: 12.9 % (ref 11.5–14.5)
WBC: 18.9 x10 3/mm — ABNORMAL HIGH (ref 3.6–11.0)

## 2013-12-23 LAB — COMPREHENSIVE METABOLIC PANEL
Albumin: 3.5 g/dL (ref 3.4–5.0)
Alkaline Phosphatase: 106 U/L
Anion Gap: 5 — ABNORMAL LOW (ref 7–16)
BUN: 14 mg/dL (ref 7–18)
Bilirubin,Total: 0.6 mg/dL (ref 0.2–1.0)
CALCIUM: 8.9 mg/dL (ref 8.5–10.1)
CREATININE: 0.66 mg/dL (ref 0.60–1.30)
Chloride: 98 mmol/L (ref 98–107)
Co2: 34 mmol/L — ABNORMAL HIGH (ref 21–32)
EGFR (African American): >60
Glucose: 146 mg/dL — ABNORMAL HIGH (ref 65–99)
Osmolality: 277 (ref 275–301)
Potassium: 4 mmol/L (ref 3.5–5.1)
SGOT(AST): 29 U/L (ref 15–37)
SGPT (ALT): 58 U/L
Sodium: 137 mmol/L (ref 136–145)
Total Protein: 7.6 g/dL (ref 6.4–8.2)

## 2013-12-23 LAB — MAGNESIUM: MAGNESIUM: 1.7 mg/dL — AB

## 2013-12-26 LAB — COMPREHENSIVE METABOLIC PANEL
ALT: 77 U/L — AB
ANION GAP: 9 (ref 7–16)
AST: 44 U/L — AB (ref 15–37)
Albumin: 3.8 g/dL (ref 3.4–5.0)
Alkaline Phosphatase: 123 U/L — ABNORMAL HIGH
BUN: 11 mg/dL (ref 7–18)
Bilirubin,Total: 0.4 mg/dL (ref 0.2–1.0)
CALCIUM: 8.9 mg/dL (ref 8.5–10.1)
CHLORIDE: 95 mmol/L — AB (ref 98–107)
Co2: 30 mmol/L (ref 21–32)
Creatinine: 0.72 mg/dL (ref 0.60–1.30)
EGFR (African American): >60
GLUCOSE: 157 mg/dL — AB (ref 65–99)
OSMOLALITY: 271 (ref 275–301)
Potassium: 3.5 mmol/L (ref 3.5–5.1)
Sodium: 134 mmol/L — ABNORMAL LOW (ref 136–145)
TOTAL PROTEIN: 7.9 g/dL (ref 6.4–8.2)

## 2013-12-26 LAB — CBC CANCER CENTER
BASOS PCT: 0.6 %
Basophil #: 0 x10 3/mm (ref 0.0–0.1)
EOS PCT: 2.8 %
Eosinophil #: 0.2 x10 3/mm (ref 0.0–0.7)
HCT: 47.6 % — AB (ref 35.0–47.0)
HGB: 15.8 g/dL (ref 12.0–16.0)
LYMPHS ABS: 1.9 x10 3/mm (ref 1.0–3.6)
LYMPHS PCT: 31.6 %
MCH: 29.7 pg (ref 26.0–34.0)
MCHC: 33.1 g/dL (ref 32.0–36.0)
MCV: 90 fL (ref 80–100)
Monocyte #: 0.5 x10 3/mm (ref 0.2–0.9)
Monocyte %: 7.6 %
NEUTROS ABS: 3.5 x10 3/mm (ref 1.4–6.5)
NEUTROS PCT: 57.4 %
Platelet: 189 x10 3/mm (ref 150–440)
RBC: 5.3 10*6/uL — ABNORMAL HIGH (ref 3.80–5.20)
RDW: 12.9 % (ref 11.5–14.5)
WBC: 6.1 x10 3/mm (ref 3.6–11.0)

## 2013-12-26 LAB — MAGNESIUM: Magnesium: 1.6 mg/dL — ABNORMAL LOW

## 2013-12-27 LAB — CLOSTRIDIUM DIFFICILE(ARMC)

## 2014-01-03 LAB — CBC CANCER CENTER
BASOS ABS: 0 x10 3/mm (ref 0.0–0.1)
Basophil %: 0.5 %
Eosinophil #: 0 x10 3/mm (ref 0.0–0.7)
Eosinophil %: 0.4 %
HCT: 42.7 % (ref 35.0–47.0)
HGB: 14 g/dL (ref 12.0–16.0)
Lymphocyte #: 1.8 x10 3/mm (ref 1.0–3.6)
Lymphocyte %: 19.1 %
MCH: 29.7 pg (ref 26.0–34.0)
MCHC: 32.8 g/dL (ref 32.0–36.0)
MCV: 91 fL (ref 80–100)
MONO ABS: 0.7 x10 3/mm (ref 0.2–0.9)
Monocyte %: 7.3 %
NEUTROS ABS: 6.8 x10 3/mm — AB (ref 1.4–6.5)
Neutrophil %: 72.7 %
Platelet: 162 x10 3/mm (ref 150–440)
RBC: 4.71 10*6/uL (ref 3.80–5.20)
RDW: 13.4 % (ref 11.5–14.5)
WBC: 9.4 x10 3/mm (ref 3.6–11.0)

## 2014-01-03 LAB — COMPREHENSIVE METABOLIC PANEL
ALK PHOS: 102 U/L
AST: 29 U/L (ref 15–37)
Albumin: 3.5 g/dL (ref 3.4–5.0)
Anion Gap: 9 (ref 7–16)
BUN: 14 mg/dL (ref 7–18)
Bilirubin,Total: 0.4 mg/dL (ref 0.2–1.0)
CHLORIDE: 100 mmol/L (ref 98–107)
CO2: 29 mmol/L (ref 21–32)
CREATININE: 0.8 mg/dL (ref 0.60–1.30)
Calcium, Total: 8.8 mg/dL (ref 8.5–10.1)
Glucose: 147 mg/dL — ABNORMAL HIGH (ref 65–99)
Osmolality: 279 (ref 275–301)
Potassium: 4.2 mmol/L (ref 3.5–5.1)
SGPT (ALT): 58 U/L
Sodium: 138 mmol/L (ref 136–145)
TOTAL PROTEIN: 7.3 g/dL (ref 6.4–8.2)

## 2014-01-04 ENCOUNTER — Inpatient Hospital Stay: Payer: Self-pay | Admitting: Oncology

## 2014-01-05 LAB — CBC WITH DIFFERENTIAL/PLATELET
Basophil #: 0.1 10*3/uL (ref 0.0–0.1)
Basophil %: 1 %
EOS ABS: 0 10*3/uL (ref 0.0–0.7)
EOS PCT: 0.3 %
HCT: 39.9 % (ref 35.0–47.0)
HGB: 13 g/dL (ref 12.0–16.0)
Lymphocyte #: 2.5 10*3/uL (ref 1.0–3.6)
Lymphocyte %: 29.2 %
MCH: 29.7 pg (ref 26.0–34.0)
MCHC: 32.4 g/dL (ref 32.0–36.0)
MCV: 92 fL (ref 80–100)
Monocyte #: 0.7 x10 3/mm (ref 0.2–0.9)
Monocyte %: 8.4 %
NEUTROS ABS: 5.3 10*3/uL (ref 1.4–6.5)
Neutrophil %: 61.1 %
PLATELETS: 185 10*3/uL (ref 150–440)
RBC: 4.36 10*6/uL (ref 3.80–5.20)
RDW: 13.4 % (ref 11.5–14.5)
WBC: 8.7 10*3/uL (ref 3.6–11.0)

## 2014-01-05 LAB — COMPREHENSIVE METABOLIC PANEL
ALBUMIN: 2.9 g/dL — AB (ref 3.4–5.0)
ALK PHOS: 76 U/L
ANION GAP: 7 (ref 7–16)
AST: 29 U/L (ref 15–37)
BILIRUBIN TOTAL: 0.3 mg/dL (ref 0.2–1.0)
BUN: 18 mg/dL (ref 7–18)
CALCIUM: 7.8 mg/dL — AB (ref 8.5–10.1)
CO2: 29 mmol/L (ref 21–32)
Chloride: 106 mmol/L (ref 98–107)
Creatinine: 0.55 mg/dL — ABNORMAL LOW (ref 0.60–1.30)
EGFR (African American): >60
EGFR (Non-African Amer.): >60
Glucose: 112 mg/dL — ABNORMAL HIGH (ref 65–99)
OSMOLALITY: 286 (ref 275–301)
POTASSIUM: 3.7 mmol/L (ref 3.5–5.1)
SGPT (ALT): 37 U/L
Sodium: 142 mmol/L (ref 136–145)
TOTAL PROTEIN: 5.9 g/dL — AB (ref 6.4–8.2)

## 2014-01-06 LAB — CREATININE, SERUM
Creatinine: 0.62 mg/dL (ref 0.60–1.30)
EGFR (African American): >60
EGFR (Non-African Amer.): >60

## 2014-01-06 LAB — VANCOMYCIN, TROUGH: VANCOMYCIN, TROUGH: 7 ug/mL — AB (ref 10–20)

## 2014-01-10 ENCOUNTER — Ambulatory Visit: Payer: Self-pay | Admitting: Oncology

## 2014-01-10 LAB — WOUND CULTURE

## 2014-01-24 LAB — COMPREHENSIVE METABOLIC PANEL
ALBUMIN: 3.8 g/dL (ref 3.4–5.0)
AST: 31 U/L (ref 15–37)
Alkaline Phosphatase: 91 U/L
Anion Gap: 9 (ref 7–16)
BILIRUBIN TOTAL: 0.4 mg/dL (ref 0.2–1.0)
BUN: 17 mg/dL (ref 7–18)
Calcium, Total: 8.8 mg/dL (ref 8.5–10.1)
Chloride: 101 mmol/L (ref 98–107)
Co2: 28 mmol/L (ref 21–32)
Creatinine: 0.8 mg/dL (ref 0.60–1.30)
EGFR (African American): >60
Glucose: 142 mg/dL — ABNORMAL HIGH (ref 65–99)
Osmolality: 280 (ref 275–301)
Potassium: 3.7 mmol/L (ref 3.5–5.1)
SGPT (ALT): 56 U/L
Sodium: 138 mmol/L (ref 136–145)
Total Protein: 7.4 g/dL (ref 6.4–8.2)

## 2014-01-24 LAB — CBC CANCER CENTER
BASOS ABS: 0.1 x10 3/mm (ref 0.0–0.1)
Basophil %: 1.1 %
EOS ABS: 0.7 x10 3/mm (ref 0.0–0.7)
Eosinophil %: 9.5 %
HCT: 43.9 % (ref 35.0–47.0)
HGB: 14.5 g/dL (ref 12.0–16.0)
LYMPHS ABS: 2.1 x10 3/mm (ref 1.0–3.6)
LYMPHS PCT: 29.4 %
MCH: 30.2 pg (ref 26.0–34.0)
MCHC: 32.9 g/dL (ref 32.0–36.0)
MCV: 92 fL (ref 80–100)
MONO ABS: 0.6 x10 3/mm (ref 0.2–0.9)
MONOS PCT: 8.3 %
NEUTROS PCT: 51.7 %
Neutrophil #: 3.7 x10 3/mm (ref 1.4–6.5)
Platelet: 215 x10 3/mm (ref 150–440)
RBC: 4.79 10*6/uL (ref 3.80–5.20)
RDW: 14.2 % (ref 11.5–14.5)
WBC: 7.2 x10 3/mm (ref 3.6–11.0)

## 2014-01-31 LAB — CBC CANCER CENTER
BASOS PCT: 0.7 %
Basophil #: 0.1 x10 3/mm (ref 0.0–0.1)
EOS ABS: 0.3 x10 3/mm (ref 0.0–0.7)
Eosinophil %: 3.3 %
HCT: 44.5 % (ref 35.0–47.0)
HGB: 14.7 g/dL (ref 12.0–16.0)
LYMPHS PCT: 26 %
Lymphocyte #: 2 x10 3/mm (ref 1.0–3.6)
MCH: 29.8 pg (ref 26.0–34.0)
MCHC: 33 g/dL (ref 32.0–36.0)
MCV: 90 fL (ref 80–100)
MONO ABS: 1 x10 3/mm — AB (ref 0.2–0.9)
MONOS PCT: 12.8 %
NEUTROS PCT: 57.2 %
Neutrophil #: 4.5 x10 3/mm (ref 1.4–6.5)
PLATELETS: 161 x10 3/mm (ref 150–440)
RBC: 4.94 10*6/uL (ref 3.80–5.20)
RDW: 14.2 % (ref 11.5–14.5)
WBC: 7.8 x10 3/mm (ref 3.6–11.0)

## 2014-02-07 LAB — CBC CANCER CENTER
BASOS ABS: 0 x10 3/mm (ref 0.0–0.1)
Basophil %: 0.6 %
EOS ABS: 0 x10 3/mm (ref 0.0–0.7)
Eosinophil %: 0.2 %
HCT: 43.9 % (ref 35.0–47.0)
HGB: 14.4 g/dL (ref 12.0–16.0)
Lymphocyte #: 2.2 x10 3/mm (ref 1.0–3.6)
Lymphocyte %: 25.9 %
MCH: 30 pg (ref 26.0–34.0)
MCHC: 32.7 g/dL (ref 32.0–36.0)
MCV: 92 fL (ref 80–100)
Monocyte #: 0.5 x10 3/mm (ref 0.2–0.9)
Monocyte %: 6.5 %
NEUTROS PCT: 66.8 %
Neutrophil #: 5.6 x10 3/mm (ref 1.4–6.5)
Platelet: 186 x10 3/mm (ref 150–440)
RBC: 4.8 10*6/uL (ref 3.80–5.20)
RDW: 14.5 % (ref 11.5–14.5)
WBC: 8.4 x10 3/mm (ref 3.6–11.0)

## 2014-02-09 ENCOUNTER — Ambulatory Visit: Payer: Self-pay | Admitting: Oncology

## 2014-02-14 ENCOUNTER — Ambulatory Visit: Payer: BC Managed Care – PPO | Admitting: General Surgery

## 2014-02-14 LAB — CBC CANCER CENTER
BASOS ABS: 0 x10 3/mm (ref 0.0–0.1)
BASOS PCT: 0.8 %
Eosinophil #: 0 x10 3/mm (ref 0.0–0.7)
Eosinophil %: 0.3 %
HCT: 44.4 % (ref 35.0–47.0)
HGB: 14.2 g/dL (ref 12.0–16.0)
Lymphocyte #: 2 x10 3/mm (ref 1.0–3.6)
Lymphocyte %: 31.2 %
MCH: 29.4 pg (ref 26.0–34.0)
MCHC: 32 g/dL (ref 32.0–36.0)
MCV: 92 fL (ref 80–100)
MONO ABS: 0.6 x10 3/mm (ref 0.2–0.9)
Monocyte %: 9.8 %
NEUTROS ABS: 3.8 x10 3/mm (ref 1.4–6.5)
Neutrophil %: 57.9 %
Platelet: 258 x10 3/mm (ref 150–440)
RBC: 4.83 10*6/uL (ref 3.80–5.20)
RDW: 14.5 % (ref 11.5–14.5)
WBC: 6.5 x10 3/mm (ref 3.6–11.0)

## 2014-02-14 LAB — COMPREHENSIVE METABOLIC PANEL
ALK PHOS: 101 U/L
Albumin: 3.9 g/dL (ref 3.4–5.0)
Anion Gap: 7 (ref 7–16)
BILIRUBIN TOTAL: 0.5 mg/dL (ref 0.2–1.0)
BUN: 17 mg/dL (ref 7–18)
CALCIUM: 9.1 mg/dL (ref 8.5–10.1)
Chloride: 102 mmol/L (ref 98–107)
Co2: 28 mmol/L (ref 21–32)
Creatinine: 0.76 mg/dL (ref 0.60–1.30)
EGFR (African American): >60
EGFR (Non-African Amer.): >60
GLUCOSE: 178 mg/dL — AB (ref 65–99)
Osmolality: 280 (ref 275–301)
Potassium: 4 mmol/L (ref 3.5–5.1)
SGOT(AST): 37 U/L (ref 15–37)
SGPT (ALT): 68 U/L — ABNORMAL HIGH
Sodium: 137 mmol/L (ref 136–145)
Total Protein: 7.2 g/dL (ref 6.4–8.2)

## 2014-02-20 ENCOUNTER — Ambulatory Visit: Payer: Self-pay | Admitting: Oncology

## 2014-02-20 DIAGNOSIS — F411 Generalized anxiety disorder: Secondary | ICD-10-CM

## 2014-02-20 LAB — COMPREHENSIVE METABOLIC PANEL
ALBUMIN: 3.8 g/dL (ref 3.4–5.0)
ALK PHOS: 144 U/L — AB
Anion Gap: 9 (ref 7–16)
BUN: 15 mg/dL (ref 7–18)
Bilirubin,Total: 0.5 mg/dL (ref 0.2–1.0)
CALCIUM: 9.2 mg/dL (ref 8.5–10.1)
CHLORIDE: 99 mmol/L (ref 98–107)
CREATININE: 0.79 mg/dL (ref 0.60–1.30)
Co2: 28 mmol/L (ref 21–32)
EGFR (African American): >60
Glucose: 196 mg/dL — ABNORMAL HIGH (ref 65–99)
Osmolality: 278 (ref 275–301)
Potassium: 3.2 mmol/L — ABNORMAL LOW (ref 3.5–5.1)
SGOT(AST): 47 U/L — ABNORMAL HIGH (ref 15–37)
SGPT (ALT): 82 U/L — ABNORMAL HIGH
Sodium: 136 mmol/L (ref 136–145)
Total Protein: 7.2 g/dL (ref 6.4–8.2)

## 2014-02-20 LAB — CBC CANCER CENTER
Basophil #: 0.1 x10 3/mm (ref 0.0–0.1)
Basophil %: 0.9 %
EOS ABS: 0 x10 3/mm (ref 0.0–0.7)
Eosinophil %: 0.5 %
HCT: 42.7 % (ref 35.0–47.0)
HGB: 14.1 g/dL (ref 12.0–16.0)
Lymphocyte #: 1.7 x10 3/mm (ref 1.0–3.6)
Lymphocyte %: 29.7 %
MCH: 30.2 pg (ref 26.0–34.0)
MCHC: 33 g/dL (ref 32.0–36.0)
MCV: 92 fL (ref 80–100)
Monocyte #: 0.4 x10 3/mm (ref 0.2–0.9)
Monocyte %: 6.4 %
NEUTROS PCT: 62.5 %
Neutrophil #: 3.7 x10 3/mm (ref 1.4–6.5)
Platelet: 163 x10 3/mm (ref 150–440)
RBC: 4.67 10*6/uL (ref 3.80–5.20)
RDW: 14.8 % — ABNORMAL HIGH (ref 11.5–14.5)
WBC: 5.8 x10 3/mm (ref 3.6–11.0)

## 2014-02-20 LAB — MAGNESIUM: Magnesium: 1.5 mg/dL — ABNORMAL LOW

## 2014-02-21 LAB — CBC CANCER CENTER
Basophil #: 0 x10 3/mm (ref 0.0–0.1)
Basophil %: 0.5 %
Eosinophil #: 0.1 x10 3/mm (ref 0.0–0.7)
Eosinophil %: 0.8 %
HCT: 40.6 % (ref 35.0–47.0)
HGB: 13.6 g/dL (ref 12.0–16.0)
LYMPHS ABS: 1 x10 3/mm (ref 1.0–3.6)
Lymphocyte %: 14.2 %
MCH: 30.5 pg (ref 26.0–34.0)
MCHC: 33.5 g/dL (ref 32.0–36.0)
MCV: 91 fL (ref 80–100)
MONOS PCT: 4.3 %
Monocyte #: 0.3 x10 3/mm (ref 0.2–0.9)
Neutrophil #: 5.7 x10 3/mm (ref 1.4–6.5)
Neutrophil %: 80.2 %
Platelet: 177 x10 3/mm (ref 150–440)
RBC: 4.46 10*6/uL (ref 3.80–5.20)
RDW: 15 % — ABNORMAL HIGH (ref 11.5–14.5)
WBC: 7.1 x10 3/mm (ref 3.6–11.0)

## 2014-02-22 ENCOUNTER — Encounter: Payer: Self-pay | Admitting: General Surgery

## 2014-02-22 ENCOUNTER — Ambulatory Visit (INDEPENDENT_AMBULATORY_CARE_PROVIDER_SITE_OTHER): Payer: BC Managed Care – PPO | Admitting: General Surgery

## 2014-02-22 VITALS — BP 130/76 | HR 78 | Resp 12 | Ht 66.0 in | Wt 192.0 lb

## 2014-02-22 DIAGNOSIS — C50412 Malignant neoplasm of upper-outer quadrant of left female breast: Secondary | ICD-10-CM

## 2014-02-22 NOTE — Progress Notes (Signed)
Patient ID: Danielle Wallace, female   DOB: 05/01/1961, 53 y.o.   MRN: [id number]  Chief Complaint  Patient presents with  . Follow-up    breast cancer    HPI Danielle Wallace is a 53 y.o. female here today for her follow up breast cancer. Patient states she is doing okay but having a lot of trouble with chemo drugs. By report she developed chest pain and shortness of breath. CT negative for pulmonary embolus. 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  The patient has been making use of antibiotic ointment to the scabs on the left reconstructed breast. She found that the area was sticky and the scabs were bleeding. Since this continually antibiotic cream the areas have dried completely and are asymptomatic. HPI  Past Medical History  Diagnosis Date  . Asthma   . Collapsed lung 2007  . Diffuse cystic mastopathy   . Arthritis 2006  . Diabetes mellitus without complication 9833  . Sinus problem   . Cancer 2006    Renal cell carcinoma; cryosurgery treatment    Past Surgical History  Procedure Laterality Date  . Cesarean section  1991  . Cyst removal neck  10/2011    Dr. Tami Ribas  . Cholecystectomy  1990  . Cryoablation  2005, 2010  . Portacath placement  11/24/13  . Breast surgery Left 10/2011    Cyst Aspirationapocrine metaplasia, ductal cells and bone cells, hypo-cellular  . Breast surgery Left 2009    ADH on stereotactic biopsy, 1.5 mm focus.  . Breast surgery Left 2003    fibrocystic changes with ductal hyperplasia without atypia.  . Breast surgery Left     mastectomy    Family History  Problem Relation Age of Onset  . Breast cancer Paternal Aunt   . Ovarian cancer Maternal Grandmother   . Liver cancer Father   . Diabetes Mother   . Hypertension Mother     Social History History  Substance Use Topics  . Smoking status: Never Smoker   . Smokeless tobacco: Never Used  . Alcohol Use: Yes     Comment: occasionally    Allergies  Allergen  Reactions  . Floxin [Ofloxacin] Other (See Comments)    systemic  . Hydrocodone Itching  . Sulfa Antibiotics Other (See Comments)    blisters  . Penicillins Rash    Current Outpatient Prescriptions  Medication Sig Dispense Refill  . albuterol (PROVENTIL HFA;VENTOLIN HFA) 108 (90 BASE) MCG/ACT inhaler Inhale 2 puffs into the lungs every 6 (six) hours as needed for wheezing or shortness of breath.      . Canagliflozin (INVOKANA) 300 MG TABS Take 1 tablet by mouth daily.      . fluticasone (VERAMYST) 27.5 MCG/SPRAY nasal spray Place 2 sprays into the nose daily.      Marland Kitchen imipramine (TOFRANIL) 25 MG tablet Take 25 mg by mouth at bedtime.      Marland Kitchen levocetirizine (XYZAL) 5 MG tablet Take 5 mg by mouth every evening.      . meloxicam (MOBIC) 7.5 MG tablet Take 7.5 mg by mouth 2 (two) times daily.      . metFORMIN (GLUCOPHAGE) 500 MG tablet Take 500 mg by mouth QID.      Marland Kitchen omeprazole (PRILOSEC) 40 MG capsule Take 40 mg by mouth as needed.      . sitaGLIPtin (JANUVIA) 50 MG tablet Take 50 mg by mouth daily.      . valsartan-hydrochlorothiazide (DIOVAN-HCT) 80-12.5 MG per tablet Take 1 tablet  by mouth daily.       No current facility-administered medications for this visit.    Review of Systems Review of Systems  Constitutional: Negative.   Respiratory: Negative.   Cardiovascular: Negative.     Blood pressure 130/76, pulse 78, resp. rate 12, height 5' 6"  (1.676 m), weight 192 lb (87.091 kg).  Physical Exam Physical Exam  Constitutional: She is oriented to person, place, and time. She appears well-developed and well-nourished.  Eyes: Conjunctivae are normal. No scleral icterus.  Neck: Neck supple.  Cardiovascular: Normal rate, regular rhythm and normal heart sounds.   Pulmonary/Chest: Effort normal and breath sounds normal. Right breast exhibits no inverted nipple, no mass, no nipple discharge, no skin change and no tenderness.    Left   Neurological: She is alert and oriented to person,  place, and time.  Skin: Skin is warm and dry.    Data Reviewed None.  Assessment    Doing well status post and sparing mastectomy with immediate reconstruction. Ongoing issues with adjuvant chemotherapy.     Plan    Patient will be asked to return to the office in eight months with a right diagnotic mammogram.    PCP: Vanetta Shawl 02/23/2014, 6:15 AM

## 2014-02-22 NOTE — Patient Instructions (Addendum)
Patient will be asked to return to the office in eight months with a right diagnotic mammogram.

## 2014-02-28 LAB — CBC CANCER CENTER
Basophil #: 0.1 x10 3/mm (ref 0.0–0.1)
Basophil %: 0.7 %
EOS ABS: 0 x10 3/mm (ref 0.0–0.7)
EOS PCT: 0.1 %
HCT: 42.8 % (ref 35.0–47.0)
HGB: 13.9 g/dL (ref 12.0–16.0)
Lymphocyte #: 2.7 x10 3/mm (ref 1.0–3.6)
Lymphocyte %: 24.5 %
MCH: 30.2 pg (ref 26.0–34.0)
MCHC: 32.5 g/dL (ref 32.0–36.0)
MCV: 93 fL (ref 80–100)
Monocyte #: 0.7 x10 3/mm (ref 0.2–0.9)
Monocyte %: 6.2 %
Neutrophil #: 7.7 x10 3/mm — ABNORMAL HIGH (ref 1.4–6.5)
Neutrophil %: 68.5 %
Platelet: 189 x10 3/mm (ref 150–440)
RBC: 4.61 10*6/uL (ref 3.80–5.20)
RDW: 15.7 % — ABNORMAL HIGH (ref 11.5–14.5)
WBC: 11.2 x10 3/mm — AB (ref 3.6–11.0)

## 2014-03-07 LAB — COMPREHENSIVE METABOLIC PANEL
ALBUMIN: 3.5 g/dL (ref 3.4–5.0)
Alkaline Phosphatase: 93 U/L
Anion Gap: 7 (ref 7–16)
BUN: 13 mg/dL (ref 7–18)
Bilirubin,Total: 0.4 mg/dL (ref 0.2–1.0)
CALCIUM: 8.9 mg/dL (ref 8.5–10.1)
CREATININE: 0.69 mg/dL (ref 0.60–1.30)
Chloride: 102 mmol/L (ref 98–107)
Co2: 30 mmol/L (ref 21–32)
EGFR (African American): >60
EGFR (Non-African Amer.): >60
Glucose: 213 mg/dL — ABNORMAL HIGH (ref 65–99)
Osmolality: 284 (ref 275–301)
POTASSIUM: 3.6 mmol/L (ref 3.5–5.1)
SGOT(AST): 36 U/L (ref 15–37)
SGPT (ALT): 81 U/L — ABNORMAL HIGH
Sodium: 139 mmol/L (ref 136–145)
Total Protein: 6.9 g/dL (ref 6.4–8.2)

## 2014-03-07 LAB — CBC CANCER CENTER
BASOS PCT: 0.6 %
Basophil #: 0 x10 3/mm (ref 0.0–0.1)
EOS ABS: 0 x10 3/mm (ref 0.0–0.7)
EOS PCT: 0.2 %
HCT: 40.9 % (ref 35.0–47.0)
HGB: 13.6 g/dL (ref 12.0–16.0)
Lymphocyte #: 1.8 x10 3/mm (ref 1.0–3.6)
Lymphocyte %: 29.4 %
MCH: 30.8 pg (ref 26.0–34.0)
MCHC: 33.2 g/dL (ref 32.0–36.0)
MCV: 93 fL (ref 80–100)
MONO ABS: 0.6 x10 3/mm (ref 0.2–0.9)
Monocyte %: 9.6 %
NEUTROS ABS: 3.8 x10 3/mm (ref 1.4–6.5)
Neutrophil %: 60.2 %
Platelet: 215 x10 3/mm (ref 150–440)
RBC: 4.4 10*6/uL (ref 3.80–5.20)
RDW: 16.2 % — ABNORMAL HIGH (ref 11.5–14.5)
WBC: 6.3 x10 3/mm (ref 3.6–11.0)

## 2014-03-12 ENCOUNTER — Ambulatory Visit: Payer: Self-pay | Admitting: Oncology

## 2014-03-13 ENCOUNTER — Encounter: Payer: Self-pay | Admitting: General Surgery

## 2014-04-10 LAB — COMPREHENSIVE METABOLIC PANEL
ALK PHOS: 110 U/L
ANION GAP: 7 (ref 7–16)
AST: 31 U/L (ref 15–37)
Albumin: 3.3 g/dL — ABNORMAL LOW (ref 3.4–5.0)
BUN: 9 mg/dL (ref 7–18)
Bilirubin,Total: 0.4 mg/dL (ref 0.2–1.0)
CALCIUM: 9.1 mg/dL (ref 8.5–10.1)
CHLORIDE: 100 mmol/L (ref 98–107)
CREATININE: 0.65 mg/dL (ref 0.60–1.30)
Co2: 31 mmol/L (ref 21–32)
EGFR (African American): >60
Glucose: 126 mg/dL — ABNORMAL HIGH (ref 65–99)
OSMOLALITY: 276 (ref 275–301)
POTASSIUM: 4 mmol/L (ref 3.5–5.1)
SGPT (ALT): 38 U/L
SODIUM: 138 mmol/L (ref 136–145)
Total Protein: 7.8 g/dL (ref 6.4–8.2)

## 2014-04-10 LAB — MAGNESIUM: MAGNESIUM: 1.8 mg/dL

## 2014-04-11 ENCOUNTER — Ambulatory Visit: Payer: Self-pay | Admitting: Oncology

## 2014-04-11 ENCOUNTER — Ambulatory Visit: Payer: Self-pay

## 2014-04-11 HISTORY — PX: BREAST SURGERY: SHX581

## 2014-04-14 LAB — VANCOMYCIN, TROUGH: VANCOMYCIN, TROUGH: 4 ug/mL — AB (ref 10–20)

## 2014-04-15 LAB — CULTURE, BLOOD (SINGLE)

## 2014-04-19 ENCOUNTER — Inpatient Hospital Stay: Payer: Self-pay | Admitting: Oncology

## 2014-04-19 LAB — COMPREHENSIVE METABOLIC PANEL
ALT: 97 U/L — AB
Albumin: 3.8 g/dL (ref 3.4–5.0)
Alkaline Phosphatase: 117 U/L — ABNORMAL HIGH
Anion Gap: 8 (ref 7–16)
BILIRUBIN TOTAL: 0.4 mg/dL (ref 0.2–1.0)
BUN: 13 mg/dL (ref 7–18)
Calcium, Total: 10 mg/dL (ref 8.5–10.1)
Chloride: 98 mmol/L (ref 98–107)
Co2: 31 mmol/L (ref 21–32)
Creatinine: 0.84 mg/dL (ref 0.60–1.30)
EGFR (Non-African Amer.): >60
Glucose: 137 mg/dL — ABNORMAL HIGH (ref 65–99)
Osmolality: 276 (ref 275–301)
Potassium: 4 mmol/L (ref 3.5–5.1)
SGOT(AST): 45 U/L — ABNORMAL HIGH (ref 15–37)
Sodium: 137 mmol/L (ref 136–145)
Total Protein: 8 g/dL (ref 6.4–8.2)

## 2014-04-19 LAB — CBC CANCER CENTER
BASOS PCT: 0.7 %
Basophil #: 0.1 x10 3/mm (ref 0.0–0.1)
Eosinophil #: 0 x10 3/mm (ref 0.0–0.7)
Eosinophil %: 0.4 %
HCT: 45.7 % (ref 35.0–47.0)
HGB: 15.1 g/dL (ref 12.0–16.0)
Lymphocyte #: 3 x10 3/mm (ref 1.0–3.6)
Lymphocyte %: 27.3 %
MCH: 30.4 pg (ref 26.0–34.0)
MCHC: 33 g/dL (ref 32.0–36.0)
MCV: 92 fL (ref 80–100)
Monocyte #: 0.7 x10 3/mm (ref 0.2–0.9)
Monocyte %: 6.4 %
Neutrophil #: 7.2 x10 3/mm — ABNORMAL HIGH (ref 1.4–6.5)
Neutrophil %: 65.2 %
Platelet: 444 x10 3/mm — ABNORMAL HIGH (ref 150–440)
RBC: 4.97 10*6/uL (ref 3.80–5.20)
RDW: 14.3 % (ref 11.5–14.5)
WBC: 11.1 x10 3/mm — ABNORMAL HIGH (ref 3.6–11.0)

## 2014-04-21 LAB — CREATININE, SERUM
CREATININE: 0.66 mg/dL (ref 0.60–1.30)
EGFR (Non-African Amer.): >60

## 2014-04-22 LAB — VANCOMYCIN, TROUGH: Vancomycin, Trough: 7 ug/mL — ABNORMAL LOW (ref 10–20)

## 2014-04-23 LAB — WOUND CULTURE

## 2014-04-24 LAB — CBC WITH DIFFERENTIAL/PLATELET
Basophil #: 0.1 10*3/uL (ref 0.0–0.1)
Basophil %: 0.6 %
Eosinophil #: 0 10*3/uL (ref 0.0–0.7)
Eosinophil %: 0.3 %
HCT: 42.1 % (ref 35.0–47.0)
HGB: 13.9 g/dL (ref 12.0–16.0)
LYMPHS ABS: 3.2 10*3/uL (ref 1.0–3.6)
LYMPHS PCT: 28.4 %
MCH: 31 pg (ref 26.0–34.0)
MCHC: 33.1 g/dL (ref 32.0–36.0)
MCV: 94 fL (ref 80–100)
MONOS PCT: 3.7 %
Monocyte #: 0.4 x10 3/mm (ref 0.2–0.9)
NEUTROS ABS: 7.5 10*3/uL — AB (ref 1.4–6.5)
NEUTROS PCT: 67 %
Platelet: 315 10*3/uL (ref 150–440)
RBC: 4.5 10*6/uL (ref 3.80–5.20)
RDW: 14.1 % (ref 11.5–14.5)
WBC: 11.2 10*3/uL — AB (ref 3.6–11.0)

## 2014-04-24 LAB — CREATININE, SERUM
Creatinine: 0.77 mg/dL (ref 0.60–1.30)
EGFR (African American): >60
EGFR (Non-African Amer.): >60

## 2014-04-24 LAB — CULTURE, BLOOD (SINGLE)

## 2014-04-24 LAB — WOUND CULTURE

## 2014-04-24 LAB — VANCOMYCIN, TROUGH: VANCOMYCIN, TROUGH: 10 ug/mL (ref 10–20)

## 2014-05-03 LAB — CBC CANCER CENTER
BASOS ABS: 0 x10 3/mm (ref 0.0–0.1)
Basophil %: 0.3 %
EOS ABS: 0 x10 3/mm (ref 0.0–0.7)
EOS PCT: 0.4 %
HCT: 45.8 % (ref 35.0–47.0)
HGB: 15.1 g/dL (ref 12.0–16.0)
LYMPHS PCT: 19 %
Lymphocyte #: 1.7 x10 3/mm (ref 1.0–3.6)
MCH: 30.4 pg (ref 26.0–34.0)
MCHC: 32.9 g/dL (ref 32.0–36.0)
MCV: 93 fL (ref 80–100)
MONO ABS: 0.3 x10 3/mm (ref 0.2–0.9)
MONOS PCT: 3 %
NEUTROS ABS: 6.8 x10 3/mm — AB (ref 1.4–6.5)
Neutrophil %: 77.3 %
PLATELETS: 256 x10 3/mm (ref 150–440)
RBC: 4.95 10*6/uL (ref 3.80–5.20)
RDW: 14.1 % (ref 11.5–14.5)
WBC: 8.8 x10 3/mm (ref 3.6–11.0)

## 2014-05-03 LAB — COMPREHENSIVE METABOLIC PANEL
ALBUMIN: 3.8 g/dL (ref 3.4–5.0)
ALK PHOS: 92 U/L
AST: 23 U/L (ref 15–37)
Anion Gap: 11 (ref 7–16)
BUN: 12 mg/dL (ref 7–18)
Bilirubin,Total: 0.4 mg/dL (ref 0.2–1.0)
Calcium, Total: 9.3 mg/dL (ref 8.5–10.1)
Chloride: 101 mmol/L (ref 98–107)
Co2: 28 mmol/L (ref 21–32)
Creatinine: 0.73 mg/dL (ref 0.60–1.30)
EGFR (African American): >60
EGFR (Non-African Amer.): >60
Glucose: 183 mg/dL — ABNORMAL HIGH (ref 65–99)
OSMOLALITY: 284 (ref 275–301)
Potassium: 3.9 mmol/L (ref 3.5–5.1)
SGPT (ALT): 65 U/L — ABNORMAL HIGH
Sodium: 140 mmol/L (ref 136–145)
TOTAL PROTEIN: 7.6 g/dL (ref 6.4–8.2)

## 2014-05-12 ENCOUNTER — Ambulatory Visit: Payer: Self-pay | Admitting: Oncology

## 2014-05-22 LAB — COMPREHENSIVE METABOLIC PANEL
ALT: 45 U/L
AST: 22 U/L (ref 15–37)
Albumin: 3.5 g/dL (ref 3.4–5.0)
Alkaline Phosphatase: 97 U/L
Anion Gap: 6 — ABNORMAL LOW (ref 7–16)
BUN: 21 mg/dL — AB (ref 7–18)
Bilirubin,Total: 0.3 mg/dL (ref 0.2–1.0)
CALCIUM: 9.1 mg/dL (ref 8.5–10.1)
CO2: 31 mmol/L (ref 21–32)
Chloride: 102 mmol/L (ref 98–107)
Creatinine: 0.69 mg/dL (ref 0.60–1.30)
Glucose: 157 mg/dL — ABNORMAL HIGH (ref 65–99)
Osmolality: 284 (ref 275–301)
POTASSIUM: 4 mmol/L (ref 3.5–5.1)
Sodium: 139 mmol/L (ref 136–145)
Total Protein: 7.2 g/dL (ref 6.4–8.2)

## 2014-05-22 LAB — CBC CANCER CENTER
BASOS PCT: 0.8 %
Basophil #: 0 x10 3/mm (ref 0.0–0.1)
EOS ABS: 0.1 x10 3/mm (ref 0.0–0.7)
Eosinophil %: 2.1 %
HCT: 44.4 % (ref 35.0–47.0)
HGB: 14.8 g/dL (ref 12.0–16.0)
Lymphocyte #: 2.4 x10 3/mm (ref 1.0–3.6)
Lymphocyte %: 37.8 %
MCH: 30.4 pg (ref 26.0–34.0)
MCHC: 33.2 g/dL (ref 32.0–36.0)
MCV: 92 fL (ref 80–100)
MONO ABS: 0.6 x10 3/mm (ref 0.2–0.9)
Monocyte %: 9.3 %
Neutrophil #: 3.2 x10 3/mm (ref 1.4–6.5)
Neutrophil %: 50 %
Platelet: 222 x10 3/mm (ref 150–440)
RBC: 4.85 10*6/uL (ref 3.80–5.20)
RDW: 13.8 % (ref 11.5–14.5)
WBC: 6.4 x10 3/mm (ref 3.6–11.0)

## 2014-05-23 DIAGNOSIS — Z85528 Personal history of other malignant neoplasm of kidney: Secondary | ICD-10-CM | POA: Insufficient documentation

## 2014-05-23 DIAGNOSIS — R109 Unspecified abdominal pain: Secondary | ICD-10-CM | POA: Insufficient documentation

## 2014-06-05 DIAGNOSIS — A319 Mycobacterial infection, unspecified: Secondary | ICD-10-CM | POA: Insufficient documentation

## 2014-06-12 ENCOUNTER — Ambulatory Visit: Payer: Self-pay | Admitting: Oncology

## 2014-06-12 LAB — CBC CANCER CENTER
BASOS ABS: 0 x10 3/mm (ref 0.0–0.1)
Basophil %: 0.7 %
EOS ABS: 0.2 x10 3/mm (ref 0.0–0.7)
Eosinophil %: 3.9 %
HCT: 43.2 % (ref 35.0–47.0)
HGB: 14.6 g/dL (ref 12.0–16.0)
Lymphocyte #: 2.3 x10 3/mm (ref 1.0–3.6)
Lymphocyte %: 36.2 %
MCH: 30.3 pg (ref 26.0–34.0)
MCHC: 33.8 g/dL (ref 32.0–36.0)
MCV: 90 fL (ref 80–100)
Monocyte #: 0.5 x10 3/mm (ref 0.2–0.9)
Monocyte %: 7.4 %
NEUTROS ABS: 3.2 x10 3/mm (ref 1.4–6.5)
Neutrophil %: 51.8 %
Platelet: 197 x10 3/mm (ref 150–440)
RBC: 4.82 10*6/uL (ref 3.80–5.20)
RDW: 13.2 % (ref 11.5–14.5)
WBC: 6.3 x10 3/mm (ref 3.6–11.0)

## 2014-06-12 LAB — COMPREHENSIVE METABOLIC PANEL
ANION GAP: 10 (ref 7–16)
Albumin: 3.6 g/dL (ref 3.4–5.0)
Alkaline Phosphatase: 83 U/L (ref 46–116)
BUN: 16 mg/dL (ref 7–18)
Bilirubin,Total: 0.4 mg/dL (ref 0.2–1.0)
CO2: 29 mmol/L (ref 21–32)
Calcium, Total: 8.9 mg/dL (ref 8.5–10.1)
Chloride: 101 mmol/L (ref 98–107)
Creatinine: 0.69 mg/dL (ref 0.60–1.30)
Glucose: 117 mg/dL — ABNORMAL HIGH (ref 65–99)
OSMOLALITY: 282 (ref 275–301)
Potassium: 4 mmol/L (ref 3.5–5.1)
SGOT(AST): 29 U/L (ref 15–37)
SGPT (ALT): 45 U/L (ref 14–63)
Sodium: 140 mmol/L (ref 136–145)
TOTAL PROTEIN: 7.1 g/dL (ref 6.4–8.2)

## 2014-07-11 ENCOUNTER — Ambulatory Visit
Admit: 2014-07-11 | Disposition: A | Discharge status: Home / Self Care | Payer: Self-pay | Attending: Oncology | Admitting: Oncology

## 2014-07-24 LAB — CREATININE, SERUM: Creatine, Serum: 0.61

## 2014-07-26 ENCOUNTER — Ambulatory Visit: Payer: Self-pay | Admitting: Infectious Diseases

## 2014-07-26 DIAGNOSIS — L52 Erythema nodosum: Secondary | ICD-10-CM | POA: Insufficient documentation

## 2014-08-09 ENCOUNTER — Encounter: Payer: Self-pay | Admitting: General Surgery

## 2014-08-09 ENCOUNTER — Ambulatory Visit (INDEPENDENT_AMBULATORY_CARE_PROVIDER_SITE_OTHER): Payer: BLUE CROSS/BLUE SHIELD | Admitting: General Surgery

## 2014-08-09 VITALS — BP 122/88 | HR 98 | Resp 16 | Ht 66.0 in | Wt 197.0 lb

## 2014-08-09 DIAGNOSIS — G609 Hereditary and idiopathic neuropathy, unspecified: Secondary | ICD-10-CM

## 2014-08-09 DIAGNOSIS — R2231 Localized swelling, mass and lump, right upper limb: Secondary | ICD-10-CM

## 2014-08-09 MED ORDER — PREGABALIN 75 MG PO CAPS
75.0000 mg | ORAL_CAPSULE | Freq: Two times a day (BID) | ORAL | Status: DC
Start: 2014-08-09 — End: 2014-08-10

## 2014-08-09 NOTE — Progress Notes (Signed)
Patient ID: Danielle Wallace, female   DOB: 06/05/1960, 54 y.o.   MRN: [id number]  Chief Complaint  Patient presents with  . Procedure    nodiule biopsy    HPI Danielle Wallace is a 54 y.o. female.  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 Or months of IV antibiotics are planned.   She has since then developed nodules at various locations on her body about 6-8 weeks ago. The nodules cause swelling and discomfort at times. The worst was 3 weeks ago in the right elbow and right wrist. She has seen infectious disease as well as rheumatology. A biopsy had been requested to help evaluate whether this was possible cutaneous gout or less likely cutaneous infection from the mycobacterium.   HPI  Past Medical History  Diagnosis Date  . Asthma   . Collapsed lung 2007  . Diffuse cystic mastopathy   . Arthritis 2006  . Diabetes mellitus without complication 7846  . Sinus problem   . Cancer 2006    Renal cell carcinoma; cryosurgery treatment    Past Surgical History  Procedure Laterality Date  . Cesarean section  1991  . Cyst removal neck  10/2011    Dr. Tami Ribas  . Cholecystectomy  1990  . Cryoablation  2005, 2010  . Portacath placement  11/24/13  . Incision and drainage Left Dec 2015, Feb 2016    Dr Tula Nakayama  . Breast surgery Left 10/2011    Cyst Aspirationapocrine metaplasia, ductal cells and bone cells, hypo-cellular  . Breast surgery Left 2009    ADH on stereotactic biopsy, 1.5 mm focus.  . Breast surgery Left 2003    fibrocystic changes with ductal hyperplasia without atypia.  . Breast surgery Left     mastectomy  . Breast surgery Left April 11, 2014    Removal of implant, debridement chest wall Dr.Coan    Family History  Problem Relation Age of Onset  . Breast cancer Paternal Aunt   . Ovarian cancer Maternal  Grandmother   . Liver cancer Father   . Diabetes Mother   . Hypertension Mother     Social History History  Substance Use Topics  . Smoking status: Never Smoker   . Smokeless tobacco: Never Used  . Alcohol Use: Yes     Comment: occasionally    Allergies  Allergen Reactions  . Floxin [Ofloxacin] Other (See Comments)    systemic  . Hydrocodone Itching  . Sulfa Antibiotics Other (See Comments)    blisters  . Codeine Rash  . Penicillins Rash    Current Outpatient Prescriptions  Medication Sig Dispense Refill  . Calcium Carb-Cholecalciferol (CALCIUM + D3 PO) Take by mouth daily.    . Canagliflozin (INVOKANA) 300 MG TABS Take 1 tablet by mouth daily.    . fluticasone (VERAMYST) 27.5 MCG/SPRAY nasal spray Place 2 sprays into the nose daily.    Marland Kitchen imipenem-cilastatin (PRIMAXIN) 500 MG injection Inject 500 mg into the vein 3 (three) times daily.    Marland Kitchen imipramine (TOFRANIL) 25 MG tablet Take 25 mg by mouth at bedtime.    Marland Kitchen letrozole (FEMARA) 2.5 MG tablet Take 2.5 mg by mouth daily.    Marland Kitchen levocetirizine (XYZAL) 5 MG tablet Take 5 mg by mouth every evening.    . meloxicam (MOBIC) 7.5 MG tablet Take 7.5 mg by mouth 2 (two) times  daily.    . metFORMIN (GLUCOPHAGE) 500 MG tablet Take 500 mg by mouth QID.    Marland Kitchen moxifloxacin (AVELOX) 400 MG tablet Take 400 mg by mouth daily at 8 pm.    . Probiotic Product (PROBIOTIC DAILY PO) Take by mouth.    . sitaGLIPtin (JANUVIA) 50 MG tablet Take 50 mg by mouth daily.    . valsartan-hydrochlorothiazide (DIOVAN-HCT) 80-12.5 MG per tablet Take 1 tablet by mouth daily.    Marland Kitchen gabapentin (NEURONTIN) 400 MG capsule Take 1 capsule (400 mg total) by mouth 3 (three) times daily. 90 capsule 4   No current facility-administered medications for this visit.    Review of Systems Review of Systems  Constitutional: Positive for fever and chills.  Respiratory: Negative.   Cardiovascular: Negative.   Gastrointestinal: Positive for nausea.  Neurological: Positive  for headaches.    Blood pressure 122/88, pulse 98, resp. rate 16, height 5' 6"  (1.676 m), weight 197 lb (89.359 kg).  Physical Exam Physical Exam  Constitutional: She is oriented to person, place, and time. She appears well-developed and well-nourished.  Pulmonary/Chest:    Musculoskeletal:       Arms: Neurological: She is alert and oriented to person, place, and time.  Skin: Skin is warm and dry.     The patient identified multiple areas of subdermal thickening. The majority disease had the texture of normal adipose tissue. The area on the right upper lateral arm appeared more distinct and was chosen for biopsy.    Data Reviewed Phone review with Dr. Richarda Overlie.  Assessment    Mycobacterium infection of the chest, new skin nodules.    Plan    The area was marked and prepped with alcohol. 10 mL of 0.5% Xylocaine with 0.25% Marcaine with 1-200,000 of epinephrine was utilized well tolerated. ChloraPrep was applied to the skin. Through a longitudinal incision the subtenon's fat was exposed and a multilobulated mass consistent with a lipoma removed. This extended down to the muscular fascia. He did not transgress the fascia. A small piece was sent fresh for AFB as requested by the referring physician via the patient. The remaining tissue was sent for routine histology. Hemostasis was with 3-0 Vicryl figure-of-eight sutures. The skin was closed with a running 3-0 Vicryl septic suture. Benzoin Steri-Strips followed by Telfa and Tegaderm dressing was applied.  The patient Danielle Wallace procedure well. She'll return in one week for wound evaluation with the staff.     PCP:  Clayborn Bigness  Ref: Dion Body Dr. Tula Nakayama Dr Oliva Bustard   Robert Bellow 08/10/2014, 3:09 PM

## 2014-08-09 NOTE — Patient Instructions (Signed)
Keep area clean

## 2014-08-10 ENCOUNTER — Telehealth: Payer: Self-pay

## 2014-08-10 ENCOUNTER — Encounter: Payer: Self-pay | Admitting: General Surgery

## 2014-08-10 DIAGNOSIS — R2230 Localized swelling, mass and lump, unspecified upper limb: Secondary | ICD-10-CM | POA: Insufficient documentation

## 2014-08-10 MED ORDER — GABAPENTIN 400 MG PO CAPS: 400.0000 mg | ORAL_CAPSULE | Freq: Three times a day (TID) | ORAL | Status: DC

## 2014-08-10 NOTE — Telephone Encounter (Signed)
-----   Message from Robert Bellow, MD sent at 08/10/2014  3:07 PM EDT ----- Please notify the patient that I have sent a prescription for Neurontin, used in place of Lyrica. 1- 400 mg tablet 3 times a day.

## 2014-08-10 NOTE — Telephone Encounter (Signed)
Notified patient as instructed, patient pleased. Discussed follow-up appointment on 08/16/14, patient agrees.

## 2014-08-11 ENCOUNTER — Ambulatory Visit
Admit: 2014-08-11 | Disposition: A | Discharge status: Home / Self Care | Payer: Self-pay | Attending: Oncology | Admitting: Oncology

## 2014-08-11 LAB — PATHOLOGY

## 2014-08-14 LAB — COMPREHENSIVE METABOLIC PANEL
ANION GAP: 8 (ref 7–16)
AST: 32 U/L
Albumin: 4.2 g/dL
Alkaline Phosphatase: 73 U/L
BUN: 16 mg/dL
Bilirubin,Total: 0.8 mg/dL
CALCIUM: 9.3 mg/dL
Chloride: 98 mmol/L — ABNORMAL LOW
Co2: 29 mmol/L
Creatinine: 0.57 mg/dL
Glucose: 189 mg/dL — ABNORMAL HIGH
POTASSIUM: 4 mmol/L
SGPT (ALT): 35 U/L
Sodium: 135 mmol/L
TOTAL PROTEIN: 7.8 g/dL

## 2014-08-14 LAB — CBC CANCER CENTER
BASOS ABS: 0.1 x10 3/mm (ref 0.0–0.1)
BASOS PCT: 0.8 %
EOS ABS: 0.3 x10 3/mm (ref 0.0–0.7)
EOS PCT: 4.7 %
HCT: 42.8 % (ref 35.0–47.0)
HGB: 14.4 g/dL (ref 12.0–16.0)
LYMPHS ABS: 1.9 x10 3/mm (ref 1.0–3.6)
LYMPHS PCT: 32.5 %
MCH: 29.9 pg (ref 26.0–34.0)
MCHC: 33.6 g/dL (ref 32.0–36.0)
MCV: 89 fL (ref 80–100)
MONO ABS: 0.4 x10 3/mm (ref 0.2–0.9)
MONOS PCT: 7.4 %
NEUTROS PCT: 54.6 %
Neutrophil #: 3.3 x10 3/mm (ref 1.4–6.5)
Platelet: 216 x10 3/mm (ref 150–440)
RBC: 4.81 10*6/uL (ref 3.80–5.20)
RDW: 13.2 % (ref 11.5–14.5)
WBC: 6 x10 3/mm (ref 3.6–11.0)

## 2014-08-16 ENCOUNTER — Ambulatory Visit (INDEPENDENT_AMBULATORY_CARE_PROVIDER_SITE_OTHER): Payer: BLUE CROSS/BLUE SHIELD | Admitting: *Deleted

## 2014-08-16 DIAGNOSIS — R2231 Localized swelling, mass and lump, right upper limb: Secondary | ICD-10-CM

## 2014-08-16 NOTE — Patient Instructions (Signed)
The patient is aware to call back for any questions or concerns.  

## 2014-08-16 NOTE — Progress Notes (Signed)
Patient came in today for a wound check right arm excision .  The wound is clean, with no signs of infection noted. Mild irritation and bruising noted from dressing. Aware of pathology, AFB pending. Follow up as scheduled.

## 2014-09-02 NOTE — Op Note (Signed)
PATIENT NAME:  Danielle Wallace, Danielle Wallace MR#:  [id number] DATE OF BIRTH:  1960-07-09  DATE OF PROCEDURE:  11/24/2013  PREOPERATIVE DIAGNOSIS:  Left breast cancer, need for central venous access.   POSTOPERATIVE DIAGNOSIS: Left breast cancer, need for central venous access.   OPERATIVE PROCEDURE: Right subclavian PowerPort placement with ultrasound and fluoroscopic guidance.   SURGEON: Hervey Ard, MD.   ANESTHESIA: General endotracheal    ESTIMATED BLOOD LOSS: Minimal.   CLINICAL NOTE: This 54 year old woman had been identified with left breast cancer and underwent a mastectomy and immediate reconstruction earlier today. At the completion of that procedure, it was planned to place a right subclavian PowerPort for planned adjuvant chemotherapy.   OPERATIVE NOTE: With the patient under adequate general endotracheal anesthesia, the chest was prepped with ChloraPrep and draped. Marcaine 0.5% plain was infiltrated for postoperative analgesia. Ultrasound was used to confirm patency of the subclavian vein. This was cannulated, followed by passage of a guidewire. The guidewire initially had curled upon itself and was positioned into the SVC under fluoroscopic guidance. The tract was dilated and the catheter positioned in the distal SVC just above the level of the right atrium. It was then tunneled to a pocket on the right anterior chest so that the port would overlay an underlying costochondral cartilage. Initially, there was poor aspiration flow and it was found that there was kinking at the puncture site. The catheter was brought back to the original puncture site and re-passed with much better flow. The catheter then easily irrigated and aspirated. The catheter was anchored with 3-point fixation (2 medial, 1 lateral) to minimize twisting. The wound was closed in layers with 3-0 Vicryl to the adipose tissue and a running 4-0 Vicryl subcuticular suture for the skin. Benzoin, Steri-Strips, Telfa and Tegaderm  dressing were then applied.   The patient tolerated the procedure well and was taken to the recovery room in stable condition.     ____________________________ Robert Bellow, MD jwb:ts D: 11/24/2013 15:41:04 ET T: 11/24/2013 16:05:18 ET JOB#: [id number]  cc: Robert Bellow, MD, <Dictator> Martie Lee. Oliva Bustard, MD Lavera Guise, MD Vidit Boissonneault Amedeo Kinsman MD ELECTRONICALLY SIGNED 11/24/2013 16:32

## 2014-09-02 NOTE — Consult Note (Signed)
PATIENT NAME:  Danielle Wallace, Danielle Wallace MR#:  [id number] DATE OF BIRTH:  09-05-1960  DATE OF CONSULTATION:  01/06/2014  REFERRING PHYSICIAN:  Dr. Oliva Bustard - Oncology CONSULTING PHYSICIAN:  Cheral Marker. Ola Spurr, MD  REASON FOR CONSULTATION: Breast cellulitis.   HISTORY OF PRESENT ILLNESS: This is a pleasant 54 year old female with a history of breast cancer who underwent mastectomy and breast reconstruction in July of 2015 by Dr. Patrice Paradise in plastic surgery. The patient had developed a seroma at the site apparently and had drainage of this by surgery several weeks ago. She then developed a scab over the incision site. Dr. Patrice Paradise apparently removed this last week. She was also having some subjective low-grade fever, feeling weak and mild chills. The patient was seen in the [facility] on Tuesday, August 25th, and received a dose of vancomycin to which she had a red man reaction. The patient's dose was slowed down and she has been tolerating it since admission. We are consulted for further evaluation.   PAST MEDICAL HISTORY: 1.  Breast cancer as above.  2.  Arthritis.  3.  Diabetes.  4.  Cholecystectomy.  5.  Hysterectomy.  6.  Kidney cancer.   FAMILY HISTORY: There is a history of hypertension, diabetes and breast cancer in the family.   SOCIAL HISTORY: The patient lives with her husband. Negative for tobacco or alcohol use.   ALLERGIES: The patient is allergic to multiple medicines including CODEINE, FLOXIN, NORCO, PENICILLIN AND SULFA DRUGS.   REVIEW OF SYSTEMS: Eleven systems reviewed and negative except as per HPI.  PHYSICAL EXAMINATION: VITAL SIGNS: Temperature 97.8, pulse 86, blood pressure 119/77, respirations 20, sat 93% on room air.  GENERAL: She is pleasant, interactive, in no acute distress.  HEENT: Pupils equal, round and reactive to light and accommodation. Extraocular movements are intact. Sclerae anicteric. Oropharynx clear.  NECK: Supple.  HEART: Regular.  LUNGS: Clear to  auscultation bilaterally.  ABDOMEN: Soft, nontender, nondistended. No hepatosplenomegaly.  EXTREMITIES: No clubbing, cyanosis or edema.  SKIN AND BREASTS: She has an incision on her left breast. There is an area around the nipple of some mild serosanguineous drainage. There is mild surrounding erythema. There is no direct purulence or drainable abscess noted.   DIAGNOSTIC DATA: Microbiology: C. diff was negative. No other blood cultures or other cultures are available at this time. I did call Dr. Towanda Malkin office and there were no cultures sent. We have swabbed the area for culture. Renal function is normal. White count 9.4, hemoglobin 14, platelets 162,000. Sugars have been well controlled. Vancomycin trough today was 7.   Imaging: None.   IMPRESSION: A 54 year old with breast cancer status post left mastectomy and implant reconstruction admitted with what appears to be a cellulitis of her left incision site. This is most likely staphylococcus or streptococcus infection, although we have no cultures pending. Clinically, she has responded somewhat to vancomycin. She did have a red man reaction but has been tolerating at a slower dose infusion since then.   RECOMMENDATIONS: 1.  I have asked the nurse to culture the lesion to see if there is any other resistant or gram-negative organisms.  2.  Continue vancomycin. Her dose will have to be increased to 1500 q. 12. We will check the trough level with a goal trough of 15 to 20. She will need weekly CBC and C-met as well as vancomycin trough levels.  3.  I will plan on a 10 to 14 day course and recheck her as an outpatient in  1 week. She can also follow up with Dr. Patrice Paradise.  4.  She will receive her infusion through her Port-A-Cath. I will check with Dr. Oliva Bustard regarding care for this as an outpatient.   Thank you for the consult. I will be glad to follow with you.   ____________________________ Cheral Marker. Ola Spurr, MD dpf:sb D: 01/06/2014 13:51:35  ET T: 01/06/2014 14:08:47 ET JOB#: [id number]  cc: Cheral Marker. Ola Spurr, MD, <Dictator> Oktober Glazer Ola Spurr MD ELECTRONICALLY SIGNED 01/08/2014 22:24

## 2014-09-02 NOTE — Discharge Summary (Signed)
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 Progesterone receptor positive.  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   Chief Complaint/History of Present Illness cont'd 54 year old lady with history of carcinoma of breast T1 C. N0 M0 tumor status post astectomy and lymph nodeevaluation followed by reconstructive surgery.    Patient came as an add-on continues to have low-grade fever feeling weak tired increasing shortness of breath cough. Patient had loss chemotherapy with carboplatinum and Taxotere in October 6 Had a implant removed and all the cultures were negative. Vancomycin was discontinued on Sunday is all the cultures were negative.  However patient continues to have low-grade fever and feeling weak and tired. Patient was seen by Dr. Adrian Prows, infectious disease specialist and he felt like patient is to be admitted in the hospital for IV antibiotics   Allergies:  Floxin: Anaphylaxis, Other  PCN: Rash, Other  Codeine: Rash  Hydrocodone/ APAP: Itching, Rash  vancomycin: Rash  Sulfa drugs: Unknown  TDMs:  14-Dec-15 09:19   Vancomycin, Trough LAB 10  Routine Chem:  11-Dec-15 08:10    Creatinine (comp) 0.66  eGFR (African American) >60  eGFR (Non-African American) >60 (eGFR values <70m/min/1.73 m2 may be an indication of chronic kidney disease (CKD). Calculated eGFR, using the MRDR Study equation, is useful in  patients with stable renal function. The eGFR calculation will not be reliable in acutely ill patients when serum creatinine is changing rapidly. It is not useful in patients on dialysis. The eGFR calculation may not be applicable to patients at the low and high extremes of body sizes, pregnant women, and vegetarians.)  Result Comment LABS - This specimen was collected through an   - indwelling catheter or arterial line.  - A minimum of 557m of blood was wasted prior    - to collecting the sample.  Interpret  - results with caution.  Result(s) reported on 21 Apr 2014 at 08:39AM.  14-Dec-15 05:04   Result Comment LABS - This specimen was collected through an   - indwelling catheter or arterial line.  - A minimum of 73m70mof blood was wasted prior    - to collecting the sample.  Interpret  - results with caution.  Result(s) reported on 24 Apr 2014 at 05:44AM.    09:19   Creatinine (comp) 0.77  eGFR (African American) >60  eGFR (Non-African American) >60 (eGFR values <8m33mn/1.73 m2 may be an indication of chronic kidney disease (CKD). Calculated eGFR, using the MRDR Study equation, is useful in  patients with stable renal function. The eGFR calculation will not be reliable in acutely ill patients when serum creatinine is changing rapidly. It is not useful in patients on dialysis. The eGFR calculation may not be applicable to patients at the low and high extremes  of body sizes, pregnant women, and vegetarians.)  Result Comment LABS - This specimen was collected through an   - indwelling catheter or arterial line.  - A minimum of 5ms of blood was wasted prior    - to collecting the sample.  Interpret  - results with caution.  Result(s) reported on  24 Apr 2014 at 10:08AM.  Routine Hem:  14-Dec-15 05:04   WBC (CBC)  11.2  RBC (CBC) 4.50  Hemoglobin (CBC) 13.9  Hematocrit (CBC) 42.1  Platelet Count (CBC) 315  MCV 94  MCH 31.0  MCHC 33.1  RDW 14.1  Neutrophil % 67.0  Lymphocyte % 28.4  Monocyte % 3.7  Eosinophil % 0.3  Basophil % 0.6  Neutrophil #  7.5  Lymphocyte # 3.2  Monocyte # 0.4  Eosinophil # 0.0  Basophil # 0.1   PERTINENT RADIOLOGY STUDIES: LabUnknown:    10-Dec-15 08:34, CT ANGIOGRAPHY Chest with for PE  PACS Image   CT:    02-Nov-15 09:38, CT Chest Without Contrast  CT Chest Without Contrast   REASON FOR EXAM:    Pulmonary Infiltrate  COMMENTS:       PROCEDURE: KCT - KCT CHEST WITHOUT CONTRAST  - Mar 13 2014  9:38AM     CLINICAL DATA:  Followup ground-glass opacities, subsequent  encounter. Fatigue. History of left breast cancer.    EXAM:  CT CHEST WITHOUT CONTRAST    TECHNIQUE:  Multidetector CT imaging of the chest was performed following the  standard protocol without IV contrast..  COMPARISON:  02/20/2014.    FINDINGS:  Right subclavian Port-A-Cath terminates in the high right atrium. No  pathologically enlarged mediastinal or axillary lymph nodes. Hilar  regions are difficult to definitively evaluate without IV contrast.  Heart is at the upper limits of normal in size. No pericardial  effusion.    Lungs are clear.  No pleural fluid.  Airway is unremarkable.    Incidental imaging of the upper abdomen shows the visualized  portions of the liver, adrenal glands, spleen, pancreas, stomach and  bowel to be grossly unremarkable. No upper abdominal adenopathy.  No worrisome lyticor sclerotic lesions. Degenerative changes are  seen in the spine.     IMPRESSION:  Lungs are clear. Pulmonary parenchymal ground-glass/mosaic  attenuation, seen on 02/20/2014, may have been due to expiratory  phase imaging on that study.      Electronically Signed    By: MLorin PicketM.D.    On: 03/13/2014  10:12         Verified By: MLuretha Rued M.D.,   Pertinent Past History:  Pertinent Past History Significant History/PMH: ??  Breast Cancer:  ??  Arthritis:  ??  cryoablation:  ??  kidney cancer:  ??  Diabetes Mellitus, Type II (NIDD):  ??  Cholecystectomy:  ??  Hysterectomy - Partial:   Hospital Course:  Hospital Course During hospital stay patient's has been started on intravenous vancomycin and then Menepenam was added.  All the cultures were negative.  Patient was seen by surgeon and decided to continue drainage.  Gradually patient's condition improved.  Dr. FOla Spurrdecided to continue antibiotic as outpatient and arrangements were made with home health services to deliver vancomycin and all other antibiotic as outpatient.  Patient would have Benadryl and steroid to prevent reaction to vancomycin.   Condition on Discharge Guarded   Code Status:  Code Status Full Code   DISCHARGE INSTRUCTIONS HOME MEDS:  Medication Reconciliation: Patient's Home Medications at Discharge:  Medication Instructions  invokana 300 mg oral tablet  1 tab(s) orally once a day   hydrochlorothiazide-valsartan 12.5 mg-80 mg oral tablet  1 tab(s) orally once a day   imipramine 25 mg oral tablet  1 tab(s) orally once a day (at bedtime)   metformin  2000 milligram(s) orally once a day at dinner   januvia 50 mg oral tablet  1 tab(s) orally once a day in am   hydrocortisone  25 milligram(s) injectable every 12 hours   acetaminophen 325 mg oral tablet  2 tab(s) orally every 4 hours, As needed, mild pain (1-3/10)   diphenhydramine  12.5 milligram(s) injectable every 12 hours    STOP TAKING THE FOLLOWING MEDICATION(S):    albuterol cfc free 90 mcg/inh inhalation aerosol:  inhaled  alprazolam 0.25 mg oral tablet: 1 tab(s) orally every 8 hours, As Needed - for Anxiety, Nervousness  Physician's Instructions:  Home Health? Yes   Home Oxygen? No   Diet Regular   Dietary Supplements None    Diet Consistency Regular Consistency   Activity Limitations None   Return to Work after follow up visit with MD   Time frame for Follow Up Appointment 1-2 weeks   Electronic Signatures: Madaleine Simmon, Martie Lee (MD)  (Signed 25-Dec-15 22:26)  Authored: ADMISSION DATE AND DIAGNOSIS, CHIEF COMPLAINT/HPI, Allergies, PERTINENT LABS, PERTINENT RADIOLOGY STUDIES, PERTINENT PAST [place] MEDS, PATIENT INSTRUCTIONS   Last Updated: 25-Dec-15 22:26 by Jobe Gibbon (MD)

## 2014-09-02 NOTE — Discharge Summary (Signed)
Dates of Admission and Diagnosis:  Date of Admission 04-Jan-2014   Date of Discharge 06-Jan-2014   Admitting Diagnosis cellulitis of left breast.   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 Progesterone receptor positive.  HER-2/neu amplified by FISH (3.8)  status pos  tnipple sparing mastectomy on the left side with reconstruction (July, 2016) Final staging is  yP1C yNOsn M0  stage 1 c  HPI:  I received a phone call from Dr. Mardene Celeste and that patient will had reconstructive surgery of the left breast has done well redness around the scar area.  MRSA was suspected and I was asked to consider patient for vancomycin.  Particularly because patient had   breast implant. .  Patient has low-grade fever.  Feeling weak and tired. 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   Allergies:  Floxin: Anaphylaxis, Other  PCN: Rash, Other  Codeine: Rash  Hydrocodone/ APAP: Itching, Rash  vancomycin: Rash  Sulfa drugs: Unknown  Hepatic:  27-Aug-15 04:18   Bilirubin, Total 0.3  Alkaline Phosphatase 76 (46-116 NOTE: New Reference Range 11/29/13)  SGPT (ALT) 37 (14-63 NOTE: New Reference Range 11/29/13)  SGOT (AST) 29  Total Protein, Serum  5.9  Albumin, Serum  2.9  Routine Chem:  27-Aug-15 04:18   Creatinine (comp)  0.55  eGFR (African American) >60  eGFR (Non-African American) >60 (eGFR values <52m/min/1.73 m2 may be an indication  of chronic kidney disease (CKD). Calculated eGFR is useful in patients with stable renal function. The eGFR calculation will not be reliable in acutely ill patients when serum creatinine is changing rapidly. It is not useful in  patients on dialysis. The eGFR calculation may not be applicable to patients at the low and high extremes of body sizes, pregnant women, and vegetarians.)  Glucose, Serum  112  BUN 18  Sodium, Serum 142  Potassium, Serum 3.7  Chloride, Serum 106  CO2, Serum 29  Calcium (Total), Serum  7.8  Osmolality (calc) 286  Anion Gap 7  Routine Hem:  27-Aug-15 04:18   WBC (CBC) 8.7  RBC (CBC) 4.36  Hemoglobin (CBC) 13.0  Hematocrit (CBC) 39.9  Platelet Count (CBC) 185  MCV 92  MCH 29.7  MCHC 32.4  RDW 13.4  Neutrophil % 61.1  Lymphocyte % 29.2  Monocyte % 8.4  Eosinophil % 0.3  Basophil % 1.0  Neutrophil # 5.3  Lymphocyte # 2.5  Monocyte # 0.7  Eosinophil # 0.0  Basophil # 0.1 (Result(s) reported on 05 Jan 2014 at 05:06AM.)   Pertinent Past History:  Pertinent Past History Significant History/PMH: ??  Breast Cancer:  ??  Arthritis:  ??  cryoablation:  ??  kidney cancer:  ??  Diabetes Mellitus, Type II (NIDD):  ??  Cholecystectomy:  ??  Hysterectomy - Partial:   Hospital Course:  Hospital Course Patient was started vancomycin 1250 mg intravenously twice a day.  Infectious disease consult was obtained.  Vancomycin levels were therapeutic Arrangements were made for patient to  receive vancomycin as outpatient by home health services. Patient has an appointment to see plastic surgeon next week   Condition on Discharge Satisfactory   Code Status:  Code Status Full Code   DISCHARGE INSTRUCTIONS HOME MEDS:  Medication Reconciliation: Patient's Home Medications at Discharge:     Medication Instructions  invokana 300 mg oral tablet  1 tab(s) orally once a day   hydrochlorothiazide-valsartan 12.5 mg-80 mg oral tablet  1 tab(s) orally once a day    imipramine 25 mg oral tablet  1 tab(s) orally once a day (at bedtime)   prilosec 40 mg oral delayed release capsule  1 cap(s) orally once a day, As Needed   albuterol cfc free 90 mcg/inh inhalation aerosol   inhaled    metformin  2000 milligram(s) orally once a day at dinner   januvia 50 mg oral tablet  1 tab(s) orally once a day in am   vancomycin  1500 milligram(s) IV every 12 hours    PRESCRIPTIONS: PRINTED AND GIVEN TO PATIENT/FAMILY  STOP TAKING THE FOLLOWING MEDICATION(S):    fluticasone nasal 50 mcg/inh nasal spray: 1 spray(s) nasal once a day, As Needed diphenhydramine 25 mg oral tablet: 1 tab(s) orally every 4 hours, As needed, itching, allergic reaction  Physician's Instructions:  Home Health? Yes   Treatments iV vancomycin   Home Oxygen? No   Diet Regular   Dietary Supplements None   Diet Consistency Regular Consistency   Activity Limitations None   Return to Work 1 week   Time frame for Follow Up Appointment 1-2 weeks   Electronic Signatures: Twania Bujak, Martie Lee (MD)  (Signed 11-Sep-15 12:26)  Authored: ADMISSION DATE AND DIAGNOSIS, CHIEF COMPLAINT/HPI, Allergies, PERTINENT LABS, PERTINENT PAST HISTORY, HOSPITAL COURSE, DISCHARGE INSTRUCTIONS HOME MEDS, PATIENT INSTRUCTIONS   Last Updated: 11-Sep-15 12:26 by Jobe Gibbon (MD)

## 2014-09-02 NOTE — Op Note (Signed)
PATIENT NAME:  Danielle Wallace, Danielle Wallace MR#:  [id number] DATE OF BIRTH:  [birth year]  DATE OF PROCEDURE:  11/24/2013  PREOPERATIVE DIAGNOSIS: Left breast cancer, desire for immediate reconstruction.   POSTOPERATIVE DIAGNOSIS: Left breast cancer, desire for immediate reconstruction.   OPERATIVE PROCEDURE: Left breast skin sparing mastectomy.   SURGEON: Hervey Ard, MD.   ASSISTANT: Nicholaus Bloom, MD.  ANESTHESIA: General endotracheal under Dr. Carolin Sicks.   ESTIMATED BLOOD LOSS: Less than 50 mL.   CLINICAL NOTE: This 54 year old woman was recently diagnosed with left breast cancer and desired to have a mastectomy followed by immediate reconstruction. She received vancomycin prior to the procedure.   OPERATIVE NOTE: After the induction of general endotracheal anesthesia, the breast, chest, and axilla were prepped with chlorhexidine and draped.   The patient had previously undergone injection with technetium sulfur colloid. Two mL of normal saline diluted 2:1 with methylene blue was injected in the subareolar plexus to assist with sentinel node identification. A curvilinear incision in the lower outer quadrant of the left breast was carried down through the skin and subcutaneous tissue with hemostasis achieved by electrocautery. The breast was then dissected free from the overlying skin making use of cautery dissection. Frozen section biopsy of the retroareolar ductal tissue showed no evidence of malignancy, allowing preservation of the nipple/areolar complex. The margins of the dissection were the costochondral margin medially, the inframammary fold inferiorly, the serratus muscle laterally, and an area approximately 3 fingerbreadths below the clavicle superiorly. The breast was orientated and sent to pathology fresh for routine processing.   Examination of the axilla through the mastectomy incision identified a single hot blue node. This was excised with hemostasis achieved by electrocautery and a 3-0  Vicryl tie. Touch print report by Delorse Lek, MD from pathology showed no evidence of macro metastatic disease.   At this point the procedure was turned over to Nicholaus Bloom, M.D., for immediate breast reconstruction.    ____________________________ Robert Bellow, MD jwb:lt D: 11/24/2013 12:19:41 ET T: 11/24/2013 12:51:38 ET JOB#: [id number]  cc: Robert Bellow, MD, <Dictator> Cleda Daub, MD Martie Lee. Oliva Bustard, MD Tiyon Sanor Amedeo Kinsman MD ELECTRONICALLY SIGNED 11/24/2013 16:32

## 2014-09-02 NOTE — Op Note (Signed)
PATIENT NAME:  Danielle Wallace, Danielle Wallace MR#:  [id number] DATE OF BIRTH:  01/03/1961  DATE OF PROCEDURE:  11/24/2013  PREOPERATIVE DIAGNOSIS: Left breast cancer and desire for immediate breast reconstruction.   POSTOPERATIVE DIAGNOSIS: Left breast cancer and desire for immediate breast reconstruction.  PROCEDURE:  1.  Assistant to Dr. Bary Castilla for left breast skin-sparing mastectomy.  2.  Immediate breast reconstruction with acellular dermal matrix and tissue expander.   STAFF SURGEON: Nicholaus Bloom, M.D.   ASSISTANT: Hervey Ard, M.D.   ANESTHESIA: General endotracheal.   ESTIMATED BLOOD LOSS: Less than 10 mL.   COMPLICATIONS: None.   DISPOSITION: Extubated, stable to recovery.   STATEMENT OF NECESSITY: The patient is a 54 year old female with a recent diagnosis of left breast carcinoma. She has sought immediate breast reconstruction and is an appropriate candidate. We have discussed the risks, benefits, and alternatives, including but not limited to, asymmetry, incorrect size, recurrent cancer, and need for secondary procedures. She has heard these and has reviewed the ASPS consent forms with me, and has requested the procedure as described.   DESCRIPTION OF PROCEDURE: The patient was brought to the Operating Room, awake, alert and comfortable, and placed on the OR table in the supine position with both arms outstretched. Endotracheal anesthesia was introduced without apparent complications. A Foley catheter was not needed for this procedure, and she was prepped and draped in the usual sterile fashion.   At this point, I directed my attention towards the left breast, and assisted Dr. Bary Castilla in performing a nipple-sparing mastectomy through a lateral, radial S-shaped incision. At the completion of the procedure, he performed a sentinel node biopsy and then turned the case over to me. The touch prep was negative. At this point, examining the tissue flaps, they had excellent viability. Nipple  dissection and core biopsy had been completed without trauma to the nipple areola complex. Perforators had been preserved as well as the inferior and medial borders of the breast. The wound was irrigated with triple antibiotic irrigation.   Next, the pectoralis major was elevated up off of the costal margin. The patient had a very short nipple-to-IMF distance and, as such, the inframammary fold was released. A small amount of dissection superiorly released the pectoralis major from the chest wall, and the lateral pennate fibers were released as well. Next, a piece of shaped AlloDerm was irrigated and washed for 2 minutes, and then secured to the inframammary fold using interrupted 2-0 PDS sutures. The new IMF was re-created 1.5 cm below the previous IMF. The AlloDerm was tucked underneath the pectoralis and then, with a continuous 2-0 Vicryl, the medial portion was placed underneath the pectoralis. This was done in running continuous fashion.   A 15 [place] Blake drain was passed through a stab incision posteriorly, and then this went into the deep space and then anteriorly on the lateral chest wall, into the superficial space over the pectoralis major. These were secured. The lateral chest border was then re-created, using several interrupted mattress sutures. Finally, the implant was selected as a style 133MX-13, 500 mL implant. Air was removed and 2 mL of methylene blue were placed internal to this. It was then placed in the subpectoral space and snugged up against the inframammary fold and the medial breast border.   The completion of the closure between pectoralis major and the AlloDerm was then performed with a second 2-0 Vicryl suture. The skin was then loosely redraped over top of the implant, and it was accessed percutaneously and  filled with 350 mL of saline. There was no tissue compromise and no tension on the overlying skin. Next, the incision was closed with buried 3-0 Vicryl and 4-0 Monocryl. The  tissue envelope was still fairly loose over top of the underlying implant. As such, the tissue was redraped, with the nipple in a medialized position. This was taped with Medifoam tape. The nipple was pink and viable at the completion of the procedure. There were no apparent complications. At this point, the procedure was turned back over to Dr. Bary Castilla, who reprepped and draped for the Mediport, and this will be dictated as a separate note.   ____________________________ Cleda Daub, MD bsc:cg D: 11/28/2013 18:44:57 ET T: 11/29/2013 02:53:16 ET JOB#: [id number]  cc: Cleda Daub, MD, <Dictator> Cleda Daub MD ELECTRONICALLY SIGNED 12/31/2013 15:19

## 2014-09-02 NOTE — Consult Note (Signed)
Brief Consult Note: Diagnosis: Breast cellulitis, s/p mastectomy and reconstruction.   Patient was seen by consultant.   Recommend further assessment or treatment.   Orders entered.   Discussed with Attending MD.   Comments: I called Dr Avelina Laine office and unfortunately no cultures were sent from her recent drainage of a seroma and removal of a scab Per her report she has clinically improved with the vancomycin and is tolerating it. I would rec 10-14 days of vancomycin at 1250 bid with weekly cbc cmet and vanco trough.  Goal is trough 15-20 I have asked RN to culture site in case there is any Gram negative involvement or resistant organisms.  Electronic Signatures: Angelena Form (MD)  (Signed 28-Aug-15 11:54)  Authored: Brief Consult Note   Last Updated: 28-Aug-15 11:54 by Angelena Form (MD)

## 2014-09-04 LAB — COMPREHENSIVE METABOLIC PANEL
ALK PHOS: 73 U/L
ALT: 33 U/L
ANION GAP: 8 (ref 7–16)
Albumin: 4.1 g/dL
BUN: 18 mg/dL
Bilirubin,Total: 0.6 mg/dL
CREATININE: 0.53 mg/dL
Calcium, Total: 9.7 mg/dL
Chloride: 99 mmol/L — ABNORMAL LOW
Co2: 31 mmol/L
EGFR (African American): >60
EGFR (Non-African Amer.): >60
Glucose: 136 mg/dL — ABNORMAL HIGH
POTASSIUM: 4 mmol/L
SGOT(AST): 32 U/L
Sodium: 138 mmol/L
Total Protein: 7.8 g/dL

## 2014-09-04 LAB — CBC CANCER CENTER
BASOS PCT: 0.8 %
Basophil #: 0.1 x10 3/mm (ref 0.0–0.1)
EOS ABS: 0.2 x10 3/mm (ref 0.0–0.7)
EOS PCT: 3.5 %
HCT: 41.2 % (ref 35.0–47.0)
HGB: 14 g/dL (ref 12.0–16.0)
LYMPHS ABS: 2.2 x10 3/mm (ref 1.0–3.6)
Lymphocyte %: 32.8 %
MCH: 29.9 pg (ref 26.0–34.0)
MCHC: 33.9 g/dL (ref 32.0–36.0)
MCV: 88 fL (ref 80–100)
MONO ABS: 0.6 x10 3/mm (ref 0.2–0.9)
Monocyte %: 8.3 %
NEUTROS PCT: 54.6 %
Neutrophil #: 3.7 x10 3/mm (ref 1.4–6.5)
PLATELETS: 224 x10 3/mm (ref 150–440)
RBC: 4.67 10*6/uL (ref 3.80–5.20)
RDW: 12.6 % (ref 11.5–14.5)
WBC: 6.7 x10 3/mm (ref 3.6–11.0)

## 2014-09-18 ENCOUNTER — Other Ambulatory Visit: Payer: Self-pay

## 2014-09-18 DIAGNOSIS — Z1231 Encounter for screening mammogram for malignant neoplasm of breast: Secondary | ICD-10-CM

## 2014-09-21 LAB — AFB CULTURE WITH SMEAR (NOT AT ARMC)
ACID FAST CULTURE: NEGATIVE
ACID FAST SMEAR: NEGATIVE

## 2014-09-22 DIAGNOSIS — R110 Nausea: Secondary | ICD-10-CM | POA: Insufficient documentation

## 2014-09-25 ENCOUNTER — Inpatient Hospital Stay: Payer: BLUE CROSS/BLUE SHIELD

## 2014-09-25 ENCOUNTER — Inpatient Hospital Stay (HOSPITAL_BASED_OUTPATIENT_CLINIC_OR_DEPARTMENT_OTHER): Payer: BLUE CROSS/BLUE SHIELD | Admitting: Oncology

## 2014-09-25 ENCOUNTER — Other Ambulatory Visit: Payer: Self-pay | Admitting: *Deleted

## 2014-09-25 ENCOUNTER — Inpatient Hospital Stay: Payer: BLUE CROSS/BLUE SHIELD | Attending: Oncology

## 2014-09-25 VITALS — BP 100/67 | HR 90 | Temp 96.1°F | Wt 194.9 lb

## 2014-09-25 DIAGNOSIS — R21 Rash and other nonspecific skin eruption: Secondary | ICD-10-CM

## 2014-09-25 DIAGNOSIS — A4902 Methicillin resistant Staphylococcus aureus infection, unspecified site: Secondary | ICD-10-CM | POA: Diagnosis not present

## 2014-09-25 DIAGNOSIS — Z79811 Long term (current) use of aromatase inhibitors: Secondary | ICD-10-CM

## 2014-09-25 DIAGNOSIS — R197 Diarrhea, unspecified: Secondary | ICD-10-CM

## 2014-09-25 DIAGNOSIS — Z170 Estrogen receptor positive status [ER+]: Secondary | ICD-10-CM | POA: Diagnosis not present

## 2014-09-25 DIAGNOSIS — E119 Type 2 diabetes mellitus without complications: Secondary | ICD-10-CM | POA: Diagnosis not present

## 2014-09-25 DIAGNOSIS — Z9012 Acquired absence of left breast and nipple: Secondary | ICD-10-CM

## 2014-09-25 DIAGNOSIS — R6883 Chills (without fever): Secondary | ICD-10-CM | POA: Insufficient documentation

## 2014-09-25 DIAGNOSIS — Z9221 Personal history of antineoplastic chemotherapy: Secondary | ICD-10-CM | POA: Diagnosis not present

## 2014-09-25 DIAGNOSIS — C50412 Malignant neoplasm of upper-outer quadrant of left female breast: Secondary | ICD-10-CM

## 2014-09-25 DIAGNOSIS — C50912 Malignant neoplasm of unspecified site of left female breast: Secondary | ICD-10-CM

## 2014-09-25 DIAGNOSIS — L02213 Cutaneous abscess of chest wall: Secondary | ICD-10-CM | POA: Diagnosis not present

## 2014-09-25 DIAGNOSIS — R630 Anorexia: Secondary | ICD-10-CM

## 2014-09-25 LAB — CBC WITH DIFFERENTIAL/PLATELET
BASOS ABS: 0 10*3/uL (ref 0–0.1)
Basophils Relative: 1 %
Eosinophils Absolute: 0.3 10*3/uL (ref 0–0.7)
Eosinophils Relative: 4 %
HCT: 41.8 % (ref 35.0–47.0)
Hemoglobin: 13.8 g/dL (ref 12.0–16.0)
LYMPHS ABS: 2.9 10*3/uL (ref 1.0–3.6)
LYMPHS PCT: 34 %
MCH: 29.2 pg (ref 26.0–34.0)
MCHC: 32.9 g/dL (ref 32.0–36.0)
MCV: 88.6 fL (ref 80.0–100.0)
Monocytes Absolute: 0.7 10*3/uL (ref 0.2–0.9)
Monocytes Relative: 8 %
Neutro Abs: 4.7 10*3/uL (ref 1.4–6.5)
Neutrophils Relative %: 53 %
PLATELETS: 150 10*3/uL (ref 150–440)
RBC: 4.72 MIL/uL (ref 3.80–5.20)
RDW: 12.8 % (ref 11.5–14.5)
WBC: 8.7 10*3/uL (ref 3.6–11.0)

## 2014-09-25 LAB — COMPREHENSIVE METABOLIC PANEL
ALBUMIN: 4.4 g/dL (ref 3.5–5.0)
ALK PHOS: 58 U/L (ref 38–126)
ALT: 38 U/L (ref 14–54)
AST: 30 U/L (ref 15–41)
Anion gap: 8 (ref 5–15)
BUN: 18 mg/dL (ref 6–20)
CHLORIDE: 100 mmol/L — AB (ref 101–111)
CO2: 30 mmol/L (ref 22–32)
CREATININE: 0.61 mg/dL (ref 0.44–1.00)
Calcium: 9.3 mg/dL (ref 8.9–10.3)
Glucose, Bld: 103 mg/dL — ABNORMAL HIGH (ref 65–99)
Potassium: 4 mmol/L (ref 3.5–5.1)
Sodium: 138 mmol/L (ref 135–145)
Total Bilirubin: 0.6 mg/dL (ref 0.3–1.2)
Total Protein: 7.5 g/dL (ref 6.5–8.1)

## 2014-09-25 LAB — C DIFFICILE QUICK SCREEN W PCR REFLEX
C DIFFICILE (CDIFF) INTERP: NEGATIVE
C Diff antigen: NEGATIVE
C Diff toxin: NEGATIVE

## 2014-09-25 MED ORDER — HEPARIN SOD (PORK) LOCK FLUSH 100 UNIT/ML IV SOLN
INTRAVENOUS | Status: AC
  Filled 2014-09-25: qty 5

## 2014-09-25 MED ORDER — HEPARIN SOD (PORK) LOCK FLUSH 100 UNIT/ML IV SOLN
500.0000 [IU] | Freq: Once | INTRAVENOUS | Status: AC
  Administered 2014-09-25: 500 [IU] via INTRAVENOUS

## 2014-09-25 MED ORDER — SODIUM CHLORIDE 0.9 % IJ SOLN
10.0000 mL | INTRAMUSCULAR | Status: DC | PRN
  Administered 2014-09-25: 10 mL via INTRAVENOUS
  Filled 2014-09-25: qty 10

## 2014-09-25 NOTE — Progress Notes (Signed)
Pt c/o vaginal yeast infection due to antibiotic therapy.

## 2014-09-30 ENCOUNTER — Encounter: Payer: Self-pay | Admitting: Oncology

## 2014-09-30 DIAGNOSIS — C50919 Malignant neoplasm of unspecified site of unspecified female breast: Secondary | ICD-10-CM | POA: Insufficient documentation

## 2014-09-30 NOTE — Progress Notes (Signed)
[place] @ [facility] [phone]  [phone]     TIONNE CARELLI OB: 12/12/1960  MR#: [id number]  JQB#:[id number]  Patient Care Team: Lavera Guise, MD as PCP - General (Internal Medicine) Robert Bellow, MD (General Surgery) Dion Body, MD as Referring Physician (Family Medicine)  CHIEF COMPLAINT:  Chief Complaint  Patient presents with  . Follow-up    Oncology History   1.carcinoma of breast(left) T1 C. N0 M0 status post ultrasound-guided biopsy Estrogen receptor positive. Progesterone receptor positive.  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 chemotherapy on hold since November because of cellulitis in the chest wall. 4.started on Herceptin May 22, 2014.  Cause of recurrent chest wall  infection chemotherapy was put on hold. 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  Patient is on multiple antibiotic and as per infectious disease specialist similar to stay on antibiotic for 4 months. So chemotherapy has been discontinued. Maintenance Herceptin as well as patient was started on letrozole from June 12, 2014     Breast cancer of upper-outer quadrant of left female breast   10/14/2013 Initial Diagnosis Breast cancer of upper-outer quadrant of left female breast    No flowsheet data found.  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  On chronic antibiotic therapy.  Recently patient has developed diarrhea for last few days.  Rash and feeling poorly.  Patient has contacted down Dr. Adrian Prows   ID specialist monitoring  patient's IV antibiotic. no chills.  No fever    REVIEW OF SYSTEMS: And general status: Patient is feeling weak and tired.  GI: Has frequent diarrhea.  Poor appetite.  Patient does not have any fever but feels chills.  HEENT: Face is flushed.  No soreness in the mouth.  Lungs: No shortness of breath.  Cardiac: Palpitations.  No chest pain.  No shortness of breath.  GI: Diarrhea as mentioned above.  Skin: Rash.  Lower extremity no swelling.  Neurological system no headache no dizziness.  Musculoskeletal system no bony pain and GU no hematuria or dysuria As per HPI. Otherwise, a complete review of systems is negatve.  PAST MEDICAL HISTORY: Past Medical History  Diagnosis Date  . Asthma   . Collapsed lung 2007  . Diffuse cystic mastopathy   . Arthritis 2006  . Diabetes mellitus without complication 4097  . Sinus problem   . Cancer 2006    Renal cell carcinoma; cryosurgery treatment  . Breast cancer     PAST SURGICAL HISTORY: Past Surgical History  Procedure Laterality Date  . Cesarean section  1991  . Cyst removal neck  10/2011    Dr. Tami Ribas  . Cholecystectomy  1990  . Cryoablation  2005, 2010  . Portacath placement  11/24/13  . Incision and drainage Left Dec 2015, Feb 2016    Dr Tula Nakayama  . Breast surgery Left 10/2011    Cyst Aspirationapocrine metaplasia, ductal cells and bone cells, hypo-cellular  . Breast surgery Left 2009    ADH on stereotactic biopsy, 1.5 mm focus.  . Breast surgery Left 2003    fibrocystic changes with ductal hyperplasia without atypia.  Marland Kitchen  Breast surgery Left     mastectomy  . Breast surgery Left April 11, 2014    Removal of implant, debridement chest wall Dr.Coan    FAMILY HISTORY Family History  Problem Relation Age of Onset  . Breast cancer Paternal Aunt   . Ovarian cancer Maternal Grandmother   . Liver cancer Father   . Diabetes Mother   . Hypertension Mother          ADVANCED DIRECTIVES:  patient does have advance care directive     HEALTH MAINTENANCE: History  Substance Use Topics  . Smoking status: Never Smoker   . Smokeless tobacco: Never Used  . Alcohol Use: Yes     Comment: occasionally    :  Allergies  Allergen Reactions  . Linezolid Anaphylaxis  . Taxotere [Docetaxel] Anaphylaxis  . Floxin [Ofloxacin] Other (See Comments)    systemic  . Hydrocodone Itching  . Sulfa Antibiotics Other (See Comments)    blisters  . Codeine Rash  . Penicillins Rash  . Vancomycin Rash    Current Outpatient Prescriptions  Medication Sig Dispense Refill  . acetaminophen (TYLENOL) 325 MG tablet Take 650 mg by mouth every 6 (six) hours as needed for mild pain.    . Calcium Carb-Cholecalciferol (CALCIUM + D3 PO) Take by mouth daily.    . Canagliflozin (INVOKANA) 300 MG TABS Take 1 tablet by mouth daily.    . fluticasone (VERAMYST) 27.5 MCG/SPRAY nasal spray Place 2 sprays into the nose daily.    Marland Kitchen gabapentin (NEURONTIN) 400 MG capsule Take 1 capsule (400 mg total) by mouth 3 (three) times daily. 90 capsule 4  . imipramine (TOFRANIL) 25 MG tablet Take 25 mg by mouth at bedtime.    Marland Kitchen letrozole (FEMARA) 2.5 MG tablet Take 2.5 mg by mouth daily.    Marland Kitchen levocetirizine (XYZAL) 5 MG tablet Take 5 mg by mouth every evening.    . meloxicam (MOBIC) 7.5 MG tablet Take 7.5 mg by mouth 2 (two) times daily.    . metFORMIN (GLUCOPHAGE) 500 MG tablet Take 500 mg by mouth QID.    Marland Kitchen moxifloxacin (AVELOX) 400 MG tablet Take 400 mg by mouth daily at 8 pm.    . Probiotic Product (PROBIOTIC DAILY PO) Take by mouth.    . sitaGLIPtin (JANUVIA) 50 MG tablet Take 50 mg by mouth daily.    . valsartan-hydrochlorothiazide (DIOVAN-HCT) 80-12.5 MG per tablet Take 1 tablet by mouth daily.    . hydrochlorothiazide (MICROZIDE) 12.5 MG capsule Take 12.5 mg by mouth daily.    Marland Kitchen imipenem-cilastatin (PRIMAXIN) 500 MG injection Inject 500 mg into the vein 3 (three) times daily.     No current facility-administered medications for this visit.     OBJECTIVE:  Filed Vitals:   09/25/14 1523  BP: 100/67  Pulse: 90  Temp: 96.1 F (35.6 C)     Body mass index is 31.47 kg/(m^2).    ECOG FS:1 - Symptomatic but completely ambulatory  PHYSICAL EXAM: general status: Patient is alert oriented feeling weak and tired.  Marland Kitchen HEENT: Face is flushed.  No evidence of stomatitis   lungs: At entry on both sides.  No crepitations. Cardiac: Tachycardia Examination of breasts: Right breast free of masses.  left breast wound is healed well.  No redness. Abdomen: Soft liver and spleen not palpable no ascites Skin: Rash on the face. Neurological system: Higher functions within normal limit.  No other localizing sign All other systems have been examined   LAB RESULTS:  Infusion  on 09/25/2014  Component Date Value Ref Range Status  . WBC 09/25/2014 8.7  3.6 - 11.0 K/uL Final   A-LINE DRAW  . RBC 09/25/2014 4.72  3.80 - 5.20 MIL/uL Final  . Hemoglobin 09/25/2014 13.8  12.0 - 16.0 g/dL Final  . HCT 09/25/2014 41.8  35.0 - 47.0 % Final  . MCV 09/25/2014 88.6  80.0 - 100.0 fL Final  . MCH 09/25/2014 29.2  26.0 - 34.0 pg Final  . MCHC 09/25/2014 32.9  32.0 - 36.0 g/dL Final  . RDW 09/25/2014 12.8  11.5 - 14.5 % Final  . Platelets 09/25/2014 150  150 - 440 K/uL Final  . Neutrophils Relative % 09/25/2014 53   Final  . Neutro Abs 09/25/2014 4.7  1.4 - 6.5 K/uL Final  . Lymphocytes Relative 09/25/2014 34   Final  . Lymphs Abs 09/25/2014 2.9  1.0 - 3.6 K/uL Final  . Monocytes Relative 09/25/2014 8   Final  . Monocytes Absolute 09/25/2014 0.7  0.2 - 0.9 K/uL Final  . Eosinophils Relative 09/25/2014 4   Final  . Eosinophils Absolute 09/25/2014 0.3  0 - 0.7 K/uL Final  . Basophils Relative 09/25/2014 1   Final  . Basophils Absolute 09/25/2014 0.0  0 - 0.1 K/uL Final  . Sodium 09/25/2014 138  135 - 145 mmol/L Final  . Potassium 09/25/2014 4.0  3.5 - 5.1 mmol/L Final  . Chloride 09/25/2014 100* 101 - 111 mmol/L Final  . CO2 09/25/2014 30  22 - 32  mmol/L Final  . Glucose, Bld 09/25/2014 103* 65 - 99 mg/dL Final  . BUN 09/25/2014 18  6 - 20 mg/dL Final  . Creatinine, Ser 09/25/2014 0.61  0.44 - 1.00 mg/dL Final  . Calcium 09/25/2014 9.3  8.9 - 10.3 mg/dL Final  . Total Protein 09/25/2014 7.5  6.5 - 8.1 g/dL Final  . Albumin 09/25/2014 4.4  3.5 - 5.0 g/dL Final  . AST 09/25/2014 30  15 - 41 U/L Final  . ALT 09/25/2014 38  14 - 54 U/L Final  . Alkaline Phosphatase 09/25/2014 58  38 - 126 U/L Final  . Total Bilirubin 09/25/2014 0.6  0.3 - 1.2 mg/dL Final  . GFR calc non Af Amer 09/25/2014 >60  >60 mL/min Final  . GFR calc Af Amer 09/25/2014 >60  >60 mL/min Final   Comment: (NOTE) The eGFR has been calculated using the CKD EPI equation. This calculation has not been validated in all clinical situations. eGFR's persistently <60 mL/min signify possible Chronic Kidney Disease.   . Anion gap 09/25/2014 8  5 - 15 Final  Office Visit on 09/25/2014  Component Date Value Ref Range Status  . C Diff antigen 09/25/2014 NEGATIVE   Final  . C Diff toxin 09/25/2014 NEGATIVE   Final  . C Diff interpretation 09/25/2014 Negative for C. difficile   Final    Lab Results  Component Value Date   LABCA2 29.7 10/14/2013   No results found for: CA199 Lab Results  Component Value Date   CEA 1.8 10/14/2013   No results found for: PSA No results found for: CA125   STUDIES: No results found.  ASSESSMENTcarcinoma breast patient is presently on maintenance Herceptin    Diarrhea which is negative for C. difficile most likely secondary 20 body therapy  MEDICAL DECISION MAKING:   HOLD CHEMOTHERAPY WITH HERCEPTIN.  PATIENT WAS ADVISED TO CONTACT ID SPECIALIST REGARDING diarrhea and adjusting  antibiotic therapy. If problem continues to get in touch with me  Continue letrozole Breast cancer of upper-outer quadrant of left female breast   Staging form: Breast, AJCC 7th Edition     Clinical: Stage IA (T1c, N0, M0) - Marni Griffon, MD    09/30/2014 4:04 PM

## 2014-10-11 ENCOUNTER — Inpatient Hospital Stay: Payer: BLUE CROSS/BLUE SHIELD | Attending: Oncology

## 2014-10-11 ENCOUNTER — Other Ambulatory Visit: Payer: Self-pay | Admitting: Oncology

## 2014-10-11 VITALS — BP 98/67 | HR 84 | Temp 97.0°F

## 2014-10-11 DIAGNOSIS — Z85528 Personal history of other malignant neoplasm of kidney: Secondary | ICD-10-CM | POA: Insufficient documentation

## 2014-10-11 DIAGNOSIS — Z791 Long term (current) use of non-steroidal anti-inflammatories (NSAID): Secondary | ICD-10-CM | POA: Insufficient documentation

## 2014-10-11 DIAGNOSIS — M1990 Unspecified osteoarthritis, unspecified site: Secondary | ICD-10-CM | POA: Diagnosis not present

## 2014-10-11 DIAGNOSIS — Z170 Estrogen receptor positive status [ER+]: Secondary | ICD-10-CM | POA: Diagnosis not present

## 2014-10-11 DIAGNOSIS — Z8041 Family history of malignant neoplasm of ovary: Secondary | ICD-10-CM | POA: Diagnosis not present

## 2014-10-11 DIAGNOSIS — R531 Weakness: Secondary | ICD-10-CM | POA: Insufficient documentation

## 2014-10-11 DIAGNOSIS — Z79811 Long term (current) use of aromatase inhibitors: Secondary | ICD-10-CM | POA: Insufficient documentation

## 2014-10-11 DIAGNOSIS — E119 Type 2 diabetes mellitus without complications: Secondary | ICD-10-CM | POA: Diagnosis not present

## 2014-10-11 DIAGNOSIS — R05 Cough: Secondary | ICD-10-CM | POA: Insufficient documentation

## 2014-10-11 DIAGNOSIS — Z9012 Acquired absence of left breast and nipple: Secondary | ICD-10-CM | POA: Diagnosis not present

## 2014-10-11 DIAGNOSIS — Z808 Family history of malignant neoplasm of other organs or systems: Secondary | ICD-10-CM | POA: Insufficient documentation

## 2014-10-11 DIAGNOSIS — R0602 Shortness of breath: Secondary | ICD-10-CM | POA: Diagnosis not present

## 2014-10-11 DIAGNOSIS — R000 Tachycardia, unspecified: Secondary | ICD-10-CM | POA: Insufficient documentation

## 2014-10-11 DIAGNOSIS — R21 Rash and other nonspecific skin eruption: Secondary | ICD-10-CM | POA: Diagnosis not present

## 2014-10-11 DIAGNOSIS — Z79899 Other long term (current) drug therapy: Secondary | ICD-10-CM | POA: Insufficient documentation

## 2014-10-11 DIAGNOSIS — R509 Fever, unspecified: Secondary | ICD-10-CM | POA: Diagnosis not present

## 2014-10-11 DIAGNOSIS — Z8614 Personal history of Methicillin resistant Staphylococcus aureus infection: Secondary | ICD-10-CM | POA: Diagnosis not present

## 2014-10-11 DIAGNOSIS — J45909 Unspecified asthma, uncomplicated: Secondary | ICD-10-CM | POA: Diagnosis not present

## 2014-10-11 DIAGNOSIS — C50412 Malignant neoplasm of upper-outer quadrant of left female breast: Secondary | ICD-10-CM | POA: Diagnosis present

## 2014-10-11 DIAGNOSIS — Z5111 Encounter for antineoplastic chemotherapy: Secondary | ICD-10-CM | POA: Diagnosis not present

## 2014-10-11 MED ORDER — DIPHENHYDRAMINE HCL 25 MG PO CAPS
50.0000 mg | ORAL_CAPSULE | Freq: Once | ORAL | Status: AC
  Administered 2014-10-11: 25 mg via ORAL
  Filled 2014-10-11: qty 2

## 2014-10-11 MED ORDER — SODIUM CHLORIDE 0.9 % IV SOLN
Freq: Once | INTRAVENOUS | Status: AC
  Administered 2014-10-11: 14:00:00 via INTRAVENOUS
  Filled 2014-10-11: qty 1000

## 2014-10-11 MED ORDER — HEPARIN SOD (PORK) LOCK FLUSH 100 UNIT/ML IV SOLN
500.0000 [IU] | Freq: Once | INTRAVENOUS | Status: AC | PRN
  Administered 2014-10-11: 500 [IU]
  Filled 2014-10-11: qty 5

## 2014-10-11 MED ORDER — TRASTUZUMAB CHEMO INJECTION 440 MG
6.0000 mg/kg | Freq: Once | INTRAVENOUS | Status: AC
  Administered 2014-10-11: 525 mg via INTRAVENOUS
  Filled 2014-10-11: qty 25

## 2014-10-11 MED ORDER — ACETAMINOPHEN 325 MG PO TABS
650.0000 mg | ORAL_TABLET | Freq: Once | ORAL | Status: AC
  Administered 2014-10-11: 650 mg via ORAL
  Filled 2014-10-11: qty 2

## 2014-10-13 ENCOUNTER — Ambulatory Visit
Admission: RE | Admit: 2014-10-13 | Discharge: 2014-10-13 | Disposition: A | Discharge status: Home / Self Care | Payer: BLUE CROSS/BLUE SHIELD | Source: Ambulatory Visit | Attending: General Surgery | Admitting: General Surgery

## 2014-10-13 DIAGNOSIS — Z1231 Encounter for screening mammogram for malignant neoplasm of breast: Secondary | ICD-10-CM

## 2014-10-24 ENCOUNTER — Ambulatory Visit (INDEPENDENT_AMBULATORY_CARE_PROVIDER_SITE_OTHER): Payer: BLUE CROSS/BLUE SHIELD | Admitting: General Surgery

## 2014-10-24 ENCOUNTER — Encounter: Payer: Self-pay | Admitting: General Surgery

## 2014-10-24 VITALS — BP 124/68 | HR 64 | Resp 12 | Ht 65.0 in | Wt 196.0 lb

## 2014-10-24 DIAGNOSIS — C50412 Malignant neoplasm of upper-outer quadrant of left female breast: Secondary | ICD-10-CM

## 2014-10-24 NOTE — Progress Notes (Signed)
Patient ID: Danielle Wallace, female   DOB: [birth year], 54 y.o.   MRN: [id number]  No chief complaint on file.   HPI Danielle Wallace is a 54 y.o. female who presents for a breast evaluation. The most recent right breast mammogram was done on 10/13/14.  Patient does perform regular self breast checks and gets regular mammograms done. No breast problems. She has been having some chest tightness, and shortness of breath that has been present for about one month. She has seen her primary care doctor for this tightness. She had an EKG done. She has chemo treatments every 3rd week she has an appointment with Dr. Oliva Bustard next week. She is planned to finish treatments in April 2017.   HPI  Past Medical History  Diagnosis Date  . Asthma   . Collapsed lung 2007  . Diffuse cystic mastopathy   . Arthritis 2006  . Diabetes mellitus without complication 3716  . Sinus problem   . Cancer 2006    Renal cell carcinoma; cryosurgery treatment  . Breast cancer 2015    chemo, herception    Past Surgical History  Procedure Laterality Date  . Cesarean section  1991  . Cyst removal neck  10/2011    Dr. Tami Ribas  . Cholecystectomy  1990  . Cryoablation  2005, 2010  . Portacath placement  11/24/13  . Incision and drainage Left Dec 2015, Feb 2016    Dr Tula Nakayama  . Breast surgery Left 10/2011    Cyst Aspirationapocrine metaplasia, ductal cells and bone cells, hypo-cellular  . Breast surgery Left 2009    ADH on stereotactic biopsy, 1.5 mm focus.  . Breast surgery Left 2003    fibrocystic changes with ductal hyperplasia without atypia.  . Breast surgery Left     mastectomy  . Breast surgery Left April 11, 2014    Removal of implant, debridement chest wall Dr.Coan  . Breast biopsy Right 2005    negative  . Breast biopsy Left 2009    negative  . Breast biopsy Left 2015    positive  . Mastectomy Left 11/24/2013    positive    Family History  Problem Relation Age of Onset  . Breast cancer Paternal Aunt  82  . Ovarian cancer Maternal Grandmother   . Liver cancer Father   . Diabetes Mother   . Hypertension Mother     Social History History  Substance Use Topics  . Smoking status: Never Smoker   . Smokeless tobacco: Never Used  . Alcohol Use: Yes     Comment: occasionally    Allergies  Allergen Reactions  . Linezolid Other (See Comments)  . Taxotere [Docetaxel] Anaphylaxis  . Floxin [Ofloxacin] Other (See Comments)    systemic  . Hydrocodone Itching  . Sulfa Antibiotics Other (See Comments)    blisters  . Codeine Rash  . Penicillins Rash  . Vancomycin Rash    Current Outpatient Prescriptions  Medication Sig Dispense Refill  . acetaminophen (TYLENOL) 325 MG tablet Take 650 mg by mouth every 6 (six) hours as needed for mild pain.    . Calcium Carb-Cholecalciferol (CALCIUM + D3 PO) Take by mouth daily.    . Canagliflozin (INVOKANA) 300 MG TABS Take 1 tablet by mouth daily.    . fluticasone (VERAMYST) 27.5 MCG/SPRAY nasal spray Place 2 sprays into the nose daily.    Marland Kitchen gabapentin (NEURONTIN) 400 MG capsule Take 1 capsule (400 mg total) by mouth 3 (three) times daily. 90 capsule 4  .  imipramine (TOFRANIL) 25 MG tablet Take 25 mg by mouth at bedtime.    Marland Kitchen letrozole (FEMARA) 2.5 MG tablet Take 2.5 mg by mouth daily.    Marland Kitchen levocetirizine (XYZAL) 5 MG tablet Take 5 mg by mouth every evening.    . meloxicam (MOBIC) 7.5 MG tablet Take 7.5 mg by mouth 2 (two) times daily.    . metFORMIN (GLUCOPHAGE) 500 MG tablet Take 500 mg by mouth QID.    Marland Kitchen Probiotic Product (PROBIOTIC DAILY PO) Take by mouth.    . sitaGLIPtin (JANUVIA) 50 MG tablet Take 50 mg by mouth daily.    . valsartan-hydrochlorothiazide (DIOVAN-HCT) 80-12.5 MG per tablet Take 1 tablet by mouth daily.     No current facility-administered medications for this visit.    Review of Systems Review of Systems  Constitutional: Negative.   Respiratory: Positive for shortness of breath.   Cardiovascular: Positive for  palpitations.    Blood pressure 124/68, pulse 64, resp. rate 12, height 5' 5"  (1.651 m), weight 196 lb (88.905 kg).  Physical Exam Physical Exam  Constitutional: She is oriented to person, place, and time. She appears well-developed and well-nourished.  Neck: No thyromegaly present.  Cardiovascular: Normal rate, regular rhythm and normal heart sounds.   No friction rub noted either in the supine, seated or leaning forward position.  Pulmonary/Chest: Effort normal and breath sounds normal. Right breast exhibits no inverted nipple, no mass, no nipple discharge, no skin change and no tenderness.    Left mastectomy site well healed   Lymphadenopathy:    She has no cervical adenopathy.    She has no axillary adenopathy.  Neurological: She is alert and oriented to person, place, and time.  Skin: Skin is warm and dry.    Data Reviewed Right breast mammogram report dated 10/13/2014 and accompanying images reviewed. BIRAD 1.  Assessment    Doing well after a long recovery from extensive infection post breast conservation.  Scheduled to complete her septum therapy in April 2017.  Symptoms suggestive of pericarditis with positional chest discomfort. Presently on anti-inflammatory is with minimal improvement.    Plan    The patient has had an echocardiogram at her PCP office. Those results are not available. She is scheduled for follow-up therefore review the results in the near future. No clinical evidence of recurrent chest wall disease or abnormal cardiopulmonary exam.     Follow up in one year with right diagnostic mammogram.  Robert Bellow 10/24/2014, 2:03 PM

## 2014-10-25 ENCOUNTER — Ambulatory Visit: Payer: BLUE CROSS/BLUE SHIELD | Admitting: Family Medicine

## 2014-10-25 ENCOUNTER — Other Ambulatory Visit: Payer: BLUE CROSS/BLUE SHIELD

## 2014-10-25 ENCOUNTER — Ambulatory Visit: Payer: BLUE CROSS/BLUE SHIELD

## 2014-11-01 ENCOUNTER — Inpatient Hospital Stay: Payer: BLUE CROSS/BLUE SHIELD

## 2014-11-01 ENCOUNTER — Other Ambulatory Visit: Payer: Self-pay | Admitting: Nurse Practitioner

## 2014-11-01 ENCOUNTER — Ambulatory Visit
Admission: RE | Admit: 2014-11-01 | Discharge: 2014-11-01 | Disposition: A | Discharge status: Home / Self Care | Payer: BLUE CROSS/BLUE SHIELD | Source: Ambulatory Visit | Attending: Nurse Practitioner | Admitting: Nurse Practitioner

## 2014-11-01 ENCOUNTER — Inpatient Hospital Stay (HOSPITAL_BASED_OUTPATIENT_CLINIC_OR_DEPARTMENT_OTHER): Payer: BLUE CROSS/BLUE SHIELD | Admitting: Oncology

## 2014-11-01 VITALS — BP 118/79 | HR 92 | Temp 97.7°F | Wt 196.7 lb

## 2014-11-01 DIAGNOSIS — R0602 Shortness of breath: Secondary | ICD-10-CM | POA: Insufficient documentation

## 2014-11-01 DIAGNOSIS — Z170 Estrogen receptor positive status [ER+]: Secondary | ICD-10-CM

## 2014-11-01 DIAGNOSIS — R000 Tachycardia, unspecified: Secondary | ICD-10-CM

## 2014-11-01 DIAGNOSIS — Z85528 Personal history of other malignant neoplasm of kidney: Secondary | ICD-10-CM

## 2014-11-01 DIAGNOSIS — C50912 Malignant neoplasm of unspecified site of left female breast: Secondary | ICD-10-CM

## 2014-11-01 DIAGNOSIS — Z9012 Acquired absence of left breast and nipple: Secondary | ICD-10-CM

## 2014-11-01 DIAGNOSIS — R05 Cough: Secondary | ICD-10-CM | POA: Diagnosis not present

## 2014-11-01 DIAGNOSIS — R21 Rash and other nonspecific skin eruption: Secondary | ICD-10-CM

## 2014-11-01 DIAGNOSIS — C50412 Malignant neoplasm of upper-outer quadrant of left female breast: Secondary | ICD-10-CM

## 2014-11-01 DIAGNOSIS — E119 Type 2 diabetes mellitus without complications: Secondary | ICD-10-CM

## 2014-11-01 DIAGNOSIS — Z79811 Long term (current) use of aromatase inhibitors: Secondary | ICD-10-CM

## 2014-11-01 DIAGNOSIS — M1990 Unspecified osteoarthritis, unspecified site: Secondary | ICD-10-CM

## 2014-11-01 DIAGNOSIS — R509 Fever, unspecified: Secondary | ICD-10-CM

## 2014-11-01 DIAGNOSIS — R531 Weakness: Secondary | ICD-10-CM

## 2014-11-01 LAB — COMPREHENSIVE METABOLIC PANEL
ALT: 52 U/L (ref 14–54)
AST: 40 U/L (ref 15–41)
Albumin: 4.3 g/dL (ref 3.5–5.0)
Alkaline Phosphatase: 70 U/L (ref 38–126)
Anion gap: 6 (ref 5–15)
BUN: 20 mg/dL (ref 6–20)
CO2: 32 mmol/L (ref 22–32)
Calcium: 9.2 mg/dL (ref 8.9–10.3)
Chloride: 97 mmol/L — ABNORMAL LOW (ref 101–111)
Creatinine, Ser: 0.63 mg/dL (ref 0.44–1.00)
Glucose, Bld: 127 mg/dL — ABNORMAL HIGH (ref 65–99)
POTASSIUM: 3.8 mmol/L (ref 3.5–5.1)
SODIUM: 135 mmol/L (ref 135–145)
TOTAL PROTEIN: 7.6 g/dL (ref 6.5–8.1)
Total Bilirubin: 0.7 mg/dL (ref 0.3–1.2)

## 2014-11-01 LAB — CBC WITH DIFFERENTIAL/PLATELET
Basophils Absolute: 0 10*3/uL (ref 0–0.1)
Basophils Relative: 1 %
Eosinophils Absolute: 0.3 10*3/uL (ref 0–0.7)
Eosinophils Relative: 4 %
HEMATOCRIT: 43.8 % (ref 35.0–47.0)
HEMOGLOBIN: 14.3 g/dL (ref 12.0–16.0)
LYMPHS ABS: 2.5 10*3/uL (ref 1.0–3.6)
Lymphocytes Relative: 34 %
MCH: 29 pg (ref 26.0–34.0)
MCHC: 32.6 g/dL (ref 32.0–36.0)
MCV: 89.2 fL (ref 80.0–100.0)
Monocytes Absolute: 0.6 10*3/uL (ref 0.2–0.9)
Monocytes Relative: 8 %
NEUTROS PCT: 53 %
Neutro Abs: 4 10*3/uL (ref 1.4–6.5)
Platelets: 219 10*3/uL (ref 150–440)
RBC: 4.91 MIL/uL (ref 3.80–5.20)
RDW: 14.8 % — ABNORMAL HIGH (ref 11.5–14.5)
WBC: 7.4 10*3/uL (ref 3.6–11.0)

## 2014-11-01 NOTE — Progress Notes (Signed)
Patient does have living will.  Never smoked. About a month ago patient begin having problems with chest pain and being short of breath especially with exertion or bending over.. Followed up with PCP.  Had CXR today. On abx for possible pneumatitis.

## 2014-11-02 ENCOUNTER — Ambulatory Visit
Admission: RE | Admit: 2014-11-02 | Discharge: 2014-11-02 | Disposition: A | Discharge status: Home / Self Care | Payer: BLUE CROSS/BLUE SHIELD | Source: Ambulatory Visit | Attending: Oncology | Admitting: Oncology

## 2014-11-02 DIAGNOSIS — C50412 Malignant neoplasm of upper-outer quadrant of left female breast: Secondary | ICD-10-CM

## 2014-11-02 MED ORDER — IOHEXOL 350 MG/ML SOLN
100.0000 mL | Freq: Once | INTRAVENOUS | Status: AC | PRN
  Administered 2014-11-02: 75 mL via INTRAVENOUS

## 2014-11-03 ENCOUNTER — Ambulatory Visit
Admission: RE | Admit: 2014-11-03 | Discharge: 2014-11-03 | Disposition: A | Discharge status: Home / Self Care | Payer: BLUE CROSS/BLUE SHIELD | Source: Ambulatory Visit | Attending: Nurse Practitioner | Admitting: Nurse Practitioner

## 2014-11-03 DIAGNOSIS — R0602 Shortness of breath: Secondary | ICD-10-CM

## 2014-11-03 MED ORDER — TECHNETIUM TC 99M SESTAMIBI - CARDIOLITE
32.7110 | Freq: Once | INTRAVENOUS | Status: AC | PRN
  Administered 2014-11-03: 09:00:00 32.711 via INTRAVENOUS

## 2014-11-03 MED ORDER — REGADENOSON 0.4 MG/5ML IV SOLN
0.4000 mg | Freq: Once | INTRAVENOUS | Status: AC
  Administered 2014-11-03: 0.4 mg via INTRAVENOUS

## 2014-11-03 MED ORDER — TECHNETIUM TC 99M SESTAMIBI - CARDIOLITE
10.0000 | Freq: Once | INTRAVENOUS | Status: AC | PRN
  Administered 2014-11-03: 08:00:00 13.01 via INTRAVENOUS

## 2014-11-06 LAB — NM MYOCAR MULTI W/SPECT W/WALL MOTION / EF
LV dias vol: 67 mL
LVSYSVOL: 32 mL
NUC STRESS TID: 1.22
SDS: 0
SRS: 10
SSS: 3

## 2014-11-08 ENCOUNTER — Encounter: Payer: Self-pay | Admitting: Oncology

## 2014-11-08 ENCOUNTER — Inpatient Hospital Stay: Payer: BLUE CROSS/BLUE SHIELD

## 2014-11-08 ENCOUNTER — Inpatient Hospital Stay (HOSPITAL_BASED_OUTPATIENT_CLINIC_OR_DEPARTMENT_OTHER): Payer: BLUE CROSS/BLUE SHIELD | Admitting: Oncology

## 2014-11-08 VITALS — BP 120/75 | HR 83 | Temp 97.2°F | Wt 198.6 lb

## 2014-11-08 DIAGNOSIS — C50412 Malignant neoplasm of upper-outer quadrant of left female breast: Secondary | ICD-10-CM

## 2014-11-08 DIAGNOSIS — Z79811 Long term (current) use of aromatase inhibitors: Secondary | ICD-10-CM

## 2014-11-08 DIAGNOSIS — J45909 Unspecified asthma, uncomplicated: Secondary | ICD-10-CM

## 2014-11-08 DIAGNOSIS — Z8041 Family history of malignant neoplasm of ovary: Secondary | ICD-10-CM

## 2014-11-08 DIAGNOSIS — Z791 Long term (current) use of non-steroidal anti-inflammatories (NSAID): Secondary | ICD-10-CM

## 2014-11-08 DIAGNOSIS — Z808 Family history of malignant neoplasm of other organs or systems: Secondary | ICD-10-CM

## 2014-11-08 DIAGNOSIS — Z170 Estrogen receptor positive status [ER+]: Secondary | ICD-10-CM | POA: Diagnosis not present

## 2014-11-08 DIAGNOSIS — Z9012 Acquired absence of left breast and nipple: Secondary | ICD-10-CM

## 2014-11-08 DIAGNOSIS — Z8614 Personal history of Methicillin resistant Staphylococcus aureus infection: Secondary | ICD-10-CM

## 2014-11-08 DIAGNOSIS — Z85528 Personal history of other malignant neoplasm of kidney: Secondary | ICD-10-CM

## 2014-11-08 DIAGNOSIS — M1990 Unspecified osteoarthritis, unspecified site: Secondary | ICD-10-CM

## 2014-11-08 DIAGNOSIS — E119 Type 2 diabetes mellitus without complications: Secondary | ICD-10-CM

## 2014-11-08 LAB — CBC WITH DIFFERENTIAL/PLATELET
BASOS PCT: 0 %
Basophils Absolute: 0 10*3/uL (ref 0–0.1)
Eosinophils Absolute: 0.2 10*3/uL (ref 0–0.7)
Eosinophils Relative: 2 %
HCT: 41.1 % (ref 35.0–47.0)
Hemoglobin: 13.5 g/dL (ref 12.0–16.0)
LYMPHS PCT: 45 %
Lymphs Abs: 4.4 10*3/uL — ABNORMAL HIGH (ref 1.0–3.6)
MCH: 29.6 pg (ref 26.0–34.0)
MCHC: 32.9 g/dL (ref 32.0–36.0)
MCV: 89.8 fL (ref 80.0–100.0)
MONO ABS: 0.8 10*3/uL (ref 0.2–0.9)
Monocytes Relative: 8 %
NEUTROS PCT: 45 %
Neutro Abs: 4.3 10*3/uL (ref 1.4–6.5)
PLATELETS: 246 10*3/uL (ref 150–440)
RBC: 4.58 MIL/uL (ref 3.80–5.20)
RDW: 15.7 % — ABNORMAL HIGH (ref 11.5–14.5)
WBC: 9.7 10*3/uL (ref 3.6–11.0)

## 2014-11-08 LAB — COMPREHENSIVE METABOLIC PANEL
ALT: 39 U/L (ref 14–54)
AST: 27 U/L (ref 15–41)
Albumin: 4.2 g/dL (ref 3.5–5.0)
Alkaline Phosphatase: 72 U/L (ref 38–126)
Anion gap: 9 (ref 5–15)
BUN: 24 mg/dL — ABNORMAL HIGH (ref 6–20)
CHLORIDE: 99 mmol/L — AB (ref 101–111)
CO2: 26 mmol/L (ref 22–32)
Calcium: 8.7 mg/dL — ABNORMAL LOW (ref 8.9–10.3)
Creatinine, Ser: 0.63 mg/dL (ref 0.44–1.00)
GFR calc Af Amer: >60 mL/min (ref >60)
Glucose, Bld: 143 mg/dL — ABNORMAL HIGH (ref 65–99)
Potassium: 3.6 mmol/L (ref 3.5–5.1)
SODIUM: 134 mmol/L — AB (ref 135–145)
Total Bilirubin: 0.7 mg/dL (ref 0.3–1.2)
Total Protein: 7.2 g/dL (ref 6.5–8.1)

## 2014-11-08 MED ORDER — SODIUM CHLORIDE 0.9 % IV SOLN
Freq: Once | INTRAVENOUS | Status: AC
  Administered 2014-11-08: 15:00:00 via INTRAVENOUS
  Filled 2014-11-08: qty 1000

## 2014-11-08 MED ORDER — SODIUM CHLORIDE 0.9 % IJ SOLN
10.0000 mL | INTRAMUSCULAR | Status: DC | PRN
  Administered 2014-11-08: 10 mL via INTRAVENOUS
  Filled 2014-11-08: qty 10

## 2014-11-08 MED ORDER — HEPARIN SOD (PORK) LOCK FLUSH 100 UNIT/ML IV SOLN
500.0000 [IU] | Freq: Once | INTRAVENOUS | Status: AC
Start: 2014-11-08 — End: 2014-11-08
  Administered 2014-11-08: 500 [IU] via INTRAVENOUS
  Filled 2014-11-08: qty 5

## 2014-11-08 MED ORDER — DIPHENHYDRAMINE HCL 50 MG/ML IJ SOLN
25.0000 mg | Freq: Once | INTRAMUSCULAR | Status: AC
  Administered 2014-11-08: 25 mg via INTRAVENOUS
  Filled 2014-11-08: qty 1

## 2014-11-08 MED ORDER — DIPHENHYDRAMINE HCL 25 MG PO CAPS: 50.0000 mg | ORAL_CAPSULE | Freq: Once | ORAL | Status: DC

## 2014-11-08 MED ORDER — TRASTUZUMAB CHEMO INJECTION 440 MG
6.0000 mg/kg | Freq: Once | INTRAVENOUS | Status: AC
  Administered 2014-11-08: 525 mg via INTRAVENOUS
  Filled 2014-11-08: qty 25

## 2014-11-08 MED ORDER — ACETAMINOPHEN 325 MG PO TABS
650.0000 mg | ORAL_TABLET | Freq: Once | ORAL | Status: AC
  Administered 2014-11-08: 650 mg via ORAL
  Filled 2014-11-08: qty 2

## 2014-11-08 NOTE — Progress Notes (Signed)
Patient does have living will.  Never smoked.

## 2014-11-11 ENCOUNTER — Encounter: Payer: Self-pay | Admitting: Oncology

## 2014-11-11 NOTE — Progress Notes (Signed)
[place] @ [facility] [phone]  [phone]     AOIFE BOLD OB: 15-Jan-1961  MR#: [id number]  EVO#:[id number]  Patient Care Team: Lavera Guise, MD as PCP - General (Internal Medicine) Robert Bellow, MD (General Surgery) Dion Body, MD as Referring Physician (Family Medicine)  CHIEF COMPLAINT:  Chief Complaint  Patient presents with  . Follow-up    Oncology History   1.carcinoma of breast(left) T1 C. N0 M0 status post ultrasound-guided biopsy Estrogen receptor positive. Progesterone receptor positive.  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 chemotherapy on hold since November because of cellulitis in the chest wall. 4.started on Herceptin May 22, 2014.  Cause of recurrent chest wall  infection chemotherapy was put on hold. 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  Patient is on multiple antibiotic and as per infectious disease specialist similar to stay on antibiotic for 4 months. So chemotherapy has been discontinued. 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   November 02, 2014 Patient is here for ongoing evaluation and treatment consideration.  Patient  is feeling weak and tired has low-grade fever.  Patient was seen by primary care physician because of increasing shortness of breath stress nuclear medicine echocardiogram is being planned.  Patient is cough shortness of breath patient was started on prednisone.  No chest pain.   REVIEW OF SYSTEMS:  Gen. status: Patient is feeling weak and tired has low-grade fever. HEENT: No headache no dizziness Lungs: Dry hacking cough.  Low-grade fever.  Shortness of breath Cardiac: No chest pain or paroxysmal nocturnal dyspnea GI: No nausea no vomiting no diarrhea GU: No dysuria hematuria Musculoskeletal system no bony pain Skin: No rash Neurological system: No tingling numbness Past Medical History  Diagnosis Date  . Asthma   . Collapsed lung 2007  . Diffuse cystic mastopathy   . Arthritis 2006  . Sinus problem   . Cancer 2006    Renal cell carcinoma; cryosurgery treatment  . Breast cancer 2015    chemo, herception  . Diabetes mellitus without complication 2993    Metformin    PAST SURGICAL HISTORY: Past Surgical History  Procedure Laterality Date  . Cesarean section  1991  . Cyst removal neck  10/2011    Dr. Tami Ribas  . Cholecystectomy  1990  . Cryoablation  2005, 2010  . Portacath placement  11/24/13  . Incision and drainage Left Dec 2015, Feb 2016    Dr Tula Nakayama  . Breast surgery Left 10/2011    Cyst Aspirationapocrine metaplasia, ductal cells and bone cells, hypo-cellular  . Breast surgery Left 2009    ADH on stereotactic biopsy, 1.5 mm focus.  . Breast surgery Left 2003    fibrocystic changes with ductal hyperplasia without atypia.  Marland Kitchen  Breast surgery Left     mastectomy  . Breast surgery Left April 11, 2014    Removal of implant, debridement chest wall Dr.Coan  . Breast biopsy Right 2005    negative  . Breast biopsy Left 2009    negative  . Breast biopsy Left 2015    positive  . Mastectomy Left 11/24/2013    positive    FAMILY HISTORY Family History  Problem Relation  Age of Onset  . Breast cancer Paternal Aunt 4  . Ovarian cancer Maternal Grandmother   . Liver cancer Father   . Diabetes Mother   . Hypertension Mother          ADVANCED DIRECTIVES:  patient does have advance care directive    HEALTH MAINTENANCE: History  Substance Use Topics  . Smoking status: Never Smoker   . Smokeless tobacco: Never Used  . Alcohol Use: Yes     Comment: occasionally    :  Allergies  Allergen Reactions  . Linezolid Other (See Comments)  . Taxotere [Docetaxel] Anaphylaxis  . Floxin [Ofloxacin] Other (See Comments)    systemic  . Hydrocodone Itching  . Sulfa Antibiotics Other (See Comments)    blisters  . Codeine Rash  . Penicillins Rash  . Vancomycin Rash    Current Outpatient Prescriptions  Medication Sig Dispense Refill  . acetaminophen (TYLENOL) 325 MG tablet Take 650 mg by mouth every 6 (six) hours as needed for mild pain.    . Calcium Carb-Cholecalciferol (CALCIUM + D3 PO) Take by mouth daily.    . Canagliflozin (INVOKANA) 300 MG TABS Take 1 tablet by mouth daily.    . fluticasone (VERAMYST) 27.5 MCG/SPRAY nasal spray Place 2 sprays into the nose daily.    Marland Kitchen gabapentin (NEURONTIN) 400 MG capsule Take 1 capsule (400 mg total) by mouth 3 (three) times daily. 90 capsule 4  . letrozole (FEMARA) 2.5 MG tablet Take 2.5 mg by mouth daily.    Marland Kitchen levocetirizine (XYZAL) 5 MG tablet Take 5 mg by mouth every evening.    . meloxicam (MOBIC) 7.5 MG tablet Take 7.5 mg by mouth 2 (two) times daily.    . metFORMIN (GLUCOPHAGE) 500 MG tablet Take 500 mg by mouth QID.    Marland Kitchen Probiotic Product (PROBIOTIC DAILY PO) Take by mouth.    . sitaGLIPtin (JANUVIA) 50 MG tablet Take 50 mg by mouth daily.    . valsartan-hydrochlorothiazide (DIOVAN-HCT) 80-12.5 MG per tablet Take 1 tablet by mouth daily.    Marland Kitchen imipramine (TOFRANIL) 25 MG tablet Take 25 mg by mouth at bedtime.    . predniSONE (STERAPRED UNI-PAK 48 TAB) 10 MG (48) TBPK tablet TAKE BY MOUTH AS DIRECTED FOR 12  DAYS  0   No current facility-administered medications for this visit.    OBJECTIVE:  Filed Vitals:   11/01/14 1349  BP: 118/79  Pulse: 92  Temp: 97.7 F (36.5 C)     Body mass index is 32.72 kg/(m^2).    ECOG FS:1 - Symptomatic but completely ambulatory  PHYSICAL EXAM: general status: Patient is alert oriented feeling weak and tired.  Marland Kitchen HEENT: Face is flushed.  No evidence of stomatitis   lungs: At entry on both sides.  No crepitations. Cardiac: Tachycardia Examination of breasts: Right breast free of masses.  left breast wound is healed well.  No redness. Abdomen: Soft liver and spleen not palpable no ascites Skin: Rash on the face. Neurological system: Higher functions within normal limit.  No other localizing sign All  other systems have been examined   LAB RESULTS:  Appointment on 11/01/2014  Component Date Value Ref Range Status  . WBC 11/01/2014 7.4  3.6 - 11.0 K/uL Final  . RBC 11/01/2014 4.91  3.80 - 5.20 MIL/uL Final  . Hemoglobin 11/01/2014 14.3  12.0 - 16.0 g/dL Final  . HCT 11/01/2014 43.8  35.0 - 47.0 % Final  . MCV 11/01/2014 89.2  80.0 - 100.0 fL Final  . MCH 11/01/2014 29.0  26.0 - 34.0 pg Final  . MCHC 11/01/2014 32.6  32.0 - 36.0 g/dL Final  . RDW 11/01/2014 14.8* 11.5 - 14.5 % Final  . Platelets 11/01/2014 219  150 - 440 K/uL Final  . Neutrophils Relative % 11/01/2014 53   Final  . Neutro Abs 11/01/2014 4.0  1.4 - 6.5 K/uL Final  . Lymphocytes Relative 11/01/2014 34   Final  . Lymphs Abs 11/01/2014 2.5  1.0 - 3.6 K/uL Final  . Monocytes Relative 11/01/2014 8   Final  . Monocytes Absolute 11/01/2014 0.6  0.2 - 0.9 K/uL Final  . Eosinophils Relative 11/01/2014 4   Final  . Eosinophils Absolute 11/01/2014 0.3  0 - 0.7 K/uL Final  . Basophils Relative 11/01/2014 1   Final  . Basophils Absolute 11/01/2014 0.0  0 - 0.1 K/uL Final  . Sodium 11/01/2014 135  135 - 145 mmol/L Final  . Potassium 11/01/2014 3.8  3.5 - 5.1 mmol/L Final  . Chloride 11/01/2014  97* 101 - 111 mmol/L Final  . CO2 11/01/2014 32  22 - 32 mmol/L Final  . Glucose, Bld 11/01/2014 127* 65 - 99 mg/dL Final  . BUN 11/01/2014 20  6 - 20 mg/dL Final  . Creatinine, Ser 11/01/2014 0.63  0.44 - 1.00 mg/dL Final  . Calcium 11/01/2014 9.2  8.9 - 10.3 mg/dL Final  . Total Protein 11/01/2014 7.6  6.5 - 8.1 g/dL Final  . Albumin 11/01/2014 4.3  3.5 - 5.0 g/dL Final  . AST 11/01/2014 40  15 - 41 U/L Final  . ALT 11/01/2014 52  14 - 54 U/L Final  . Alkaline Phosphatase 11/01/2014 70  38 - 126 U/L Final  . Total Bilirubin 11/01/2014 0.7  0.3 - 1.2 mg/dL Final  . GFR calc non Af Amer 11/01/2014 >60  >60 mL/min Final  . GFR calc Af Amer 11/01/2014 >60  >60 mL/min Final   Comment: (NOTE) The eGFR has been calculated using the CKD EPI equation. This calculation has not been validated in all clinical situations. eGFR's persistently <60 mL/min signify possible Chronic Kidney Disease.   . Anion gap 11/01/2014 6  5 - 15 Final    Lab Results  Component Value Date   LABCA2 29.7 10/14/2013   No results found for: CA199 Lab Results  Component Value Date   CEA 1.8 10/14/2013   No results found for: PSA No results found for: CA125   STUDIES: Dg Chest 2 View  11/01/2014   CLINICAL DATA:  Shortness of breath and productive cough for 1 month. Midsternal chest pain.  EXAM: CHEST  2 VIEW  COMPARISON:  Chest x-ray dated 04/19/2014 and CT scan dated 04/20/2014  FINDINGS: Power port in place with the tip in the right atrium, unchanged. Previous left mastectomy.  Heart size and pulmonary vascularity are normal. The lungs are clear. No acute osseous abnormality.  IMPRESSION: No acute abnormalities.   Electronically Signed   By: Lorriane Shire M.D.   On: 11/01/2014 13:39   Ct Angio Chest Pe  W/cm &/or Wo Cm  11/02/2014   CLINICAL DATA:  Short of breath, history of left breast carcinoma, now with some mid sternal chest pain  EXAM: CT ANGIOGRAPHY CHEST WITH CONTRAST  TECHNIQUE: Multidetector CT  imaging of the chest was performed using the standard protocol during bolus administration of intravenous contrast. Multiplanar CT image reconstructions and MIPs were obtained to evaluate the vascular anatomy.  CONTRAST:  7m OMNIPAQUE IOHEXOL 350 MG/ML SOLN  COMPARISON:  Chest x-ray of 11/01/2014 and CT chest of 04/20/2014  FINDINGS: The pulmonary arteries are well opacified. There is no evidence of pulmonary embolism. The thoracic aorta also opacifies with no significant abnormality noted. The origins of the great vessels appear patent. No mediastinal or hilar adenopathy is seen. The heart is mildly enlarged. No abnormality of the upper abdomen is seen. The thyroid gland is unremarkable.  On lung window images, no parenchymal infiltrate is seen. There is no evidence of pleural effusion. No suspicious lung nodule or mass is seen. The central airway is patent. There are degenerative changes in the mid to lower thoracic spine. Changes of a left mastectomy are noted.  Review of the MIP images confirms the above findings.  IMPRESSION: 1. No evidence of pulmonary embolism. 2. No infiltrate, effusion, or adenopathy is seen.   Electronically Signed   By: PIvar DrapeM.D.   On: 11/02/2014 15:29   Nm Myocar Multi W/spect W/wall Motion / Ef  11/06/2014    There was no ST segment deviation noted during stress.   Gated imaging revealed normal left ventricular function. SPECT imaging did  not reveal fixed or reversible defects. No evidence for scar or ischemia.   Mm Digital Screening Unilat R  10/13/2014   CLINICAL DATA:  Screening.  EXAM: DIGITAL SCREENING UNILATERAL RIGHT MAMMOGRAM WITH CAD  COMPARISON:  Previous exam(s).  ACR Breast Density Category c: The breast tissue is heterogeneously dense, which may obscure small masses.  FINDINGS: The patient has had a left mastectomy. There are no findings suspicious for malignancy. <CAD Text>  IMPRESSION: No mammographic evidence of malignancy. A result letter of this  screening mammogram will be mailed directly to the patient.  RECOMMENDATION: Screening mammogram in one year.  ([id number])  BI-RADS CATEGORY  1: Negative.   Electronically Signed   By: GConchita ParisM.D.   On: 10/13/2014 14:10    ASSESSMENT  Fever and shortness of breath Stress echocardiogram pending Because of shortness of breath we will proceed with CT scan of the chest to rule out pulmonary embolism Hold chemotherapy  MEDICAL DECISION MAKING:  All lab data has been reviewed. CT scan of the chest did not show any evidence of pulmonary embolism or pulmonary infiltrate.  CT scan has been reviewed independently We will await stress echocardiogram before starting Herceptin Continue letrozole Breast cancer of upper-outer quadrant of left female breast   Staging form: Breast, AJCC 7th Edition     Clinical: Stage IA (T1c, N0, M0) - UMarni Griffon MD   11/11/2014 8:55 AM

## 2014-11-14 ENCOUNTER — Encounter: Payer: Self-pay | Admitting: Oncology

## 2014-11-14 NOTE — Progress Notes (Signed)
[place] @ [facility] [phone]  Fax:([phone]) [phone]     MAKENLI DERSTINE OB: [birth year]  MR#: [id number]  OFB#:[id number]  Patient Care Team: Lavera Guise, MD as PCP - General (Internal Medicine) Robert Bellow, MD (General Surgery) Dion Body, MD as Referring Physician (Family Medicine)  CHIEF COMPLAINT:  Chief Complaint  Patient presents with  . Follow-up    Oncology History   1.carcinoma of breast(left) T1 C. N0 M0 status post ultrasound-guided biopsy Estrogen receptor positive. Progesterone receptor positive.  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 chemotherapy on hold since November because of cellulitis in the chest wall. 4.started on Herceptin May 22, 2014.  Cause of recurrent chest wall  infection chemotherapy was put on hold. 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  Patient is on multiple antibiotic and as per infectious disease specialist similar to stay on antibiotic for 4 months. So chemotherapy has been discontinued. 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   November 02, 2014 Patient is here for ongoing evaluation and treatment consideration.  Patient  is feeling weak and tired has low-grade fever.  Patient was seen by primary care physician because of increasing shortness of breath stress nuclear medicine echocardiogram is being planned.  Patient is cough shortness of breath patient was started on prednisone.  No chest pain. November 08, 2014 Patient is here for ongoing evaluation and treatment consideration.  Cough and shortness of breath is improved.  Patient had a CT scan of the chest which did not reveal any evidence of pulmonary embolism or pulmonary infiltrate. Nuclear stress MUGA scan also was within acceptable range.  Patient has improved on prednisone which is being tapered off Here for further evaluation and continuation of chemotherapy with maintenance Herceptin    REVIEW OF SYSTEMS:  Gen. status: Patient is feeling weak and tired has low-grade fever. HEENT: No headache no dizziness Lungs: Shortness of breath has improved.  Cough is improved.  No fever.   Cardiac: No chest pain or paroxysmal nocturnal dyspnea GI: No nausea no vomiting no diarrhea GU: No dysuria hematuria Musculoskeletal system no bony pain Skin: No rash Neurological system: No tingling numbness Past Medical History  Diagnosis Date  . Asthma   . Collapsed lung 2007  . Diffuse cystic mastopathy   . Arthritis 2006  . Sinus problem   . Cancer 2006    Renal cell carcinoma; cryosurgery treatment  . Breast cancer 2015    chemo, herception  . Diabetes mellitus without complication 7824    Metformin    PAST SURGICAL HISTORY: Past Surgical History  Procedure Laterality Date  . Cesarean section  1991  . Cyst removal neck  10/2011    Dr. Tami Ribas  . Cholecystectomy  1990  .  Cryoablation  2005, 2010  . Portacath placement  11/24/13  . Incision and drainage Left Dec 2015, Feb 2016    Dr Tula Nakayama  . Breast surgery Left 10/2011    Cyst Aspirationapocrine metaplasia, ductal cells and bone cells, hypo-cellular  . Breast surgery Left 2009    ADH on stereotactic biopsy,  1.5 mm focus.  . Breast surgery Left 2003    fibrocystic changes with ductal hyperplasia without atypia.  . Breast surgery Left     mastectomy  . Breast surgery Left April 11, 2014    Removal of implant, debridement chest wall Dr.Coan  . Breast biopsy Right 2005    negative  . Breast biopsy Left 2009    negative  . Breast biopsy Left 2015    positive  . Mastectomy Left 11/24/2013    positive    FAMILY HISTORY Family History  Problem Relation Age of Onset  . Breast cancer Paternal Aunt 43  . Ovarian cancer Maternal Grandmother   . Liver cancer Father   . Diabetes Mother   . Hypertension Mother          ADVANCED DIRECTIVES:  patient does have advance care directive    HEALTH MAINTENANCE: History  Substance Use Topics  . Smoking status: Never Smoker   . Smokeless tobacco: Never Used  . Alcohol Use: Yes     Comment: occasionally    :  Allergies  Allergen Reactions  . Linezolid Other (See Comments)  . Taxotere [Docetaxel] Anaphylaxis  . Floxin [Ofloxacin] Other (See Comments)    systemic  . Hydrocodone Itching  . Sulfa Antibiotics Other (See Comments)    blisters  . Codeine Rash  . Penicillins Rash  . Vancomycin Rash    Current Outpatient Prescriptions  Medication Sig Dispense Refill  . acetaminophen (TYLENOL) 325 MG tablet Take 650 mg by mouth every 6 (six) hours as needed for mild pain.    . Calcium Carb-Cholecalciferol (CALCIUM + D3 PO) Take by mouth daily.    . Canagliflozin (INVOKANA) 300 MG TABS Take 1 tablet by mouth daily.    . fluticasone (VERAMYST) 27.5 MCG/SPRAY nasal spray Place 2 sprays into the nose daily.    Marland Kitchen gabapentin (NEURONTIN) 400 MG capsule Take 1 capsule (400 mg total) by mouth 3 (three) times daily. 90 capsule 4  . imipramine (TOFRANIL) 25 MG tablet Take 25 mg by mouth at bedtime.    Marland Kitchen letrozole (FEMARA) 2.5 MG tablet Take 2.5 mg by mouth daily.    Marland Kitchen levocetirizine (XYZAL) 5 MG tablet Take 5 mg by mouth every evening.    .  meloxicam (MOBIC) 7.5 MG tablet Take 7.5 mg by mouth 2 (two) times daily.    . metFORMIN (GLUCOPHAGE) 500 MG tablet Take 500 mg by mouth QID.    Marland Kitchen predniSONE (STERAPRED UNI-PAK 48 TAB) 10 MG (48) TBPK tablet TAKE BY MOUTH AS DIRECTED FOR 12 DAYS  0  . Probiotic Product (PROBIOTIC DAILY PO) Take by mouth.    . sitaGLIPtin (JANUVIA) 50 MG tablet Take 50 mg by mouth daily.    . valsartan-hydrochlorothiazide (DIOVAN-HCT) 80-12.5 MG per tablet Take 1 tablet by mouth daily.     No current facility-administered medications for this visit.    OBJECTIVE:  Filed Vitals:   11/08/14 1407  BP: 120/75  Pulse: 83  Temp: 97.2 F (36.2 C)     Body mass index is 33.05 kg/(m^2).    ECOG FS:1 - Symptomatic but completely ambulatory  PHYSICAL EXAM: general status:  Patient is alert oriented feeling weak and tired.  Marland Kitchen HEENT: Face is flushed.  No evidence of stomatitis   lExamination of the chest was unremarkable. There were no bony deformities, no asymmetry, and no other abnormalities.  Cardiac exam revealed the PMI to be normally situated and sized. The rhythm was regular and no extrasystoles were noted during several minutes of auscultation. The first and second heart sounds were normal and physiologic splitting of the second heart sound was noted. There were no murmurs, rubs, clicks, or gallops. Examination of breasts: Right breast free of masses.  left breast wound is healed well.  No redness. Abdomen: Soft liver and spleen not palpable no ascites Examination of the skin revealed no evidence of significant rashes, suspicious appearing nevi or other concerning lesions.. Lymphatic system: Supraclavicular, cervical, axillary, inguinal lymph nodes are not palpable Left chest wall area and no evidence of recurrent disease.  Wound is healed completely.  Right breast free of masses  Neurological system: Higher functions within normal limit.  No other localizing sign All other systems have been examined   LAB  RESULTS:  Infusion on 11/08/2014  Component Date Value Ref Range Status  . WBC 11/08/2014 9.7  3.6 - 11.0 K/uL Final   A-LINE DRAW  . RBC 11/08/2014 4.58  3.80 - 5.20 MIL/uL Final  . Hemoglobin 11/08/2014 13.5  12.0 - 16.0 g/dL Final  . HCT 11/08/2014 41.1  35.0 - 47.0 % Final  . MCV 11/08/2014 89.8  80.0 - 100.0 fL Final  . MCH 11/08/2014 29.6  26.0 - 34.0 pg Final  . MCHC 11/08/2014 32.9  32.0 - 36.0 g/dL Final  . RDW 11/08/2014 15.7* 11.5 - 14.5 % Final  . Platelets 11/08/2014 246  150 - 440 K/uL Final  . Neutrophils Relative % 11/08/2014 45   Final  . Neutro Abs 11/08/2014 4.3  1.4 - 6.5 K/uL Final  . Lymphocytes Relative 11/08/2014 45   Final  . Lymphs Abs 11/08/2014 4.4* 1.0 - 3.6 K/uL Final  . Monocytes Relative 11/08/2014 8   Final  . Monocytes Absolute 11/08/2014 0.8  0.2 - 0.9 K/uL Final  . Eosinophils Relative 11/08/2014 2   Final  . Eosinophils Absolute 11/08/2014 0.2  0 - 0.7 K/uL Final  . Basophils Relative 11/08/2014 0   Final  . Basophils Absolute 11/08/2014 0.0  0 - 0.1 K/uL Final  . Sodium 11/08/2014 134* 135 - 145 mmol/L Final  . Potassium 11/08/2014 3.6  3.5 - 5.1 mmol/L Final  . Chloride 11/08/2014 99* 101 - 111 mmol/L Final  . CO2 11/08/2014 26  22 - 32 mmol/L Final  . Glucose, Bld 11/08/2014 143* 65 - 99 mg/dL Final  . BUN 11/08/2014 24* 6 - 20 mg/dL Final  . Creatinine, Ser 11/08/2014 0.63  0.44 - 1.00 mg/dL Final  . Calcium 11/08/2014 8.7* 8.9 - 10.3 mg/dL Final  . Total Protein 11/08/2014 7.2  6.5 - 8.1 g/dL Final  . Albumin 11/08/2014 4.2  3.5 - 5.0 g/dL Final  . AST 11/08/2014 27  15 - 41 U/L Final  . ALT 11/08/2014 39  14 - 54 U/L Final  . Alkaline Phosphatase 11/08/2014 72  38 - 126 U/L Final  . Total Bilirubin 11/08/2014 0.7  0.3 - 1.2 mg/dL Final  . GFR calc non Af Amer 11/08/2014 >60  >60 mL/min Final  . GFR calc Af Amer 11/08/2014 >60  >60 mL/min Final   Comment: (NOTE) The eGFR has been calculated using the CKD EPI  equation. This  calculation has not been validated in all clinical situations. eGFR's persistently <60 mL/min signify possible Chronic Kidney Disease.   . Anion gap 11/08/2014 9  5 - 15 Final    Lab Results  Component Value Date   LABCA2 29.7 10/14/2013   No results found for: CA199 Lab Results  Component Value Date   CEA 1.8 10/14/2013   No results found for: PSA No results found for: CA125   STUDIES: Dg Chest 2 View  11/01/2014   CLINICAL DATA:  Shortness of breath and productive cough for 1 month. Midsternal chest pain.  EXAM: CHEST  2 VIEW  COMPARISON:  Chest x-ray dated 04/19/2014 and CT scan dated 04/20/2014  FINDINGS: Power port in place with the tip in the right atrium, unchanged. Previous left mastectomy.  Heart size and pulmonary vascularity are normal. The lungs are clear. No acute osseous abnormality.  IMPRESSION: No acute abnormalities.   Electronically Signed   By: Lorriane Shire M.D.   On: 11/01/2014 13:39   Ct Angio Chest Pe W/cm &/or Wo Cm  11/02/2014   CLINICAL DATA:  Short of breath, history of left breast carcinoma, now with some mid sternal chest pain  EXAM: CT ANGIOGRAPHY CHEST WITH CONTRAST  TECHNIQUE: Multidetector CT imaging of the chest was performed using the standard protocol during bolus administration of intravenous contrast. Multiplanar CT image reconstructions and MIPs were obtained to evaluate the vascular anatomy.  CONTRAST:  45m OMNIPAQUE IOHEXOL 350 MG/ML SOLN  COMPARISON:  Chest x-ray of 11/01/2014 and CT chest of 04/20/2014  FINDINGS: The pulmonary arteries are well opacified. There is no evidence of pulmonary embolism. The thoracic aorta also opacifies with no significant abnormality noted. The origins of the great vessels appear patent. No mediastinal or hilar adenopathy is seen. The heart is mildly enlarged. No abnormality of the upper abdomen is seen. The thyroid gland is unremarkable.  On lung window images, no parenchymal infiltrate is seen. There is no evidence  of pleural effusion. No suspicious lung nodule or mass is seen. The central airway is patent. There are degenerative changes in the mid to lower thoracic spine. Changes of a left mastectomy are noted.  Review of the MIP images confirms the above findings.  IMPRESSION: 1. No evidence of pulmonary embolism. 2. No infiltrate, effusion, or adenopathy is seen.   Electronically Signed   By: PIvar DrapeM.D.   On: 11/02/2014 15:29   Nm Myocar Multi W/spect W/wall Motion / Ef  11/06/2014    There was no ST segment deviation noted during stress.   Gated imaging revealed normal left ventricular function. SPECT imaging did  not reveal fixed or reversible defects. No evidence for scar or ischemia.    ASSESSMENT  Cancer of breast stage IC Continue Herceptin therapy  MEDICAL DECISION MAKING:  All lab data has been reviewed. CT scan of the chest did not show any evidence of pulmonary embolism or pulmonary infiltrate.  CT scan has been reviewed independently Stress echocardiogram was reported to be normal.  Continue Herceptin  Continue letrozole Breast cancer of upper-outer quadrant of left female breast   Staging form: Breast, AJCC 7th Edition     Clinical: Stage IA (T1c, N0, M0) - UMarni Griffon MD   11/14/2014 8:28 PM

## 2014-11-29 ENCOUNTER — Inpatient Hospital Stay: Payer: BLUE CROSS/BLUE SHIELD

## 2014-11-29 ENCOUNTER — Inpatient Hospital Stay: Payer: BLUE CROSS/BLUE SHIELD | Attending: Oncology | Admitting: Oncology

## 2014-11-29 VITALS — BP 127/80 | HR 92 | Temp 97.3°F | Wt 198.4 lb

## 2014-11-29 DIAGNOSIS — Z9012 Acquired absence of left breast and nipple: Secondary | ICD-10-CM | POA: Diagnosis not present

## 2014-11-29 DIAGNOSIS — B9689 Other specified bacterial agents as the cause of diseases classified elsewhere: Secondary | ICD-10-CM | POA: Insufficient documentation

## 2014-11-29 DIAGNOSIS — M1990 Unspecified osteoarthritis, unspecified site: Secondary | ICD-10-CM | POA: Diagnosis not present

## 2014-11-29 DIAGNOSIS — L03313 Cellulitis of chest wall: Secondary | ICD-10-CM | POA: Insufficient documentation

## 2014-11-29 DIAGNOSIS — Z85528 Personal history of other malignant neoplasm of kidney: Secondary | ICD-10-CM | POA: Diagnosis not present

## 2014-11-29 DIAGNOSIS — Z79811 Long term (current) use of aromatase inhibitors: Secondary | ICD-10-CM | POA: Diagnosis not present

## 2014-11-29 DIAGNOSIS — Z170 Estrogen receptor positive status [ER+]: Secondary | ICD-10-CM | POA: Diagnosis not present

## 2014-11-29 DIAGNOSIS — C50412 Malignant neoplasm of upper-outer quadrant of left female breast: Secondary | ICD-10-CM

## 2014-11-29 DIAGNOSIS — Z5111 Encounter for antineoplastic chemotherapy: Secondary | ICD-10-CM | POA: Diagnosis not present

## 2014-11-29 DIAGNOSIS — Z79899 Other long term (current) drug therapy: Secondary | ICD-10-CM

## 2014-11-29 DIAGNOSIS — E119 Type 2 diabetes mellitus without complications: Secondary | ICD-10-CM | POA: Diagnosis not present

## 2014-11-29 LAB — COMPREHENSIVE METABOLIC PANEL
ALBUMIN: 4.1 g/dL (ref 3.5–5.0)
ALT: 46 U/L (ref 14–54)
AST: 38 U/L (ref 15–41)
Alkaline Phosphatase: 68 U/L (ref 38–126)
Anion gap: 6 (ref 5–15)
BILIRUBIN TOTAL: 0.7 mg/dL (ref 0.3–1.2)
BUN: 13 mg/dL (ref 6–20)
CHLORIDE: 101 mmol/L (ref 101–111)
CO2: 29 mmol/L (ref 22–32)
Calcium: 8.9 mg/dL (ref 8.9–10.3)
Creatinine, Ser: 0.61 mg/dL (ref 0.44–1.00)
GFR calc non Af Amer: >60 mL/min (ref >60)
GLUCOSE: 176 mg/dL — AB (ref 65–99)
Potassium: 3.5 mmol/L (ref 3.5–5.1)
SODIUM: 136 mmol/L (ref 135–145)
Total Protein: 7.3 g/dL (ref 6.5–8.1)

## 2014-11-29 LAB — CBC WITH DIFFERENTIAL/PLATELET
BASOS ABS: 0 10*3/uL (ref 0–0.1)
Basophils Relative: 0 %
EOS PCT: 3 %
Eosinophils Absolute: 0.2 10*3/uL (ref 0–0.7)
HCT: 43.2 % (ref 35.0–47.0)
Hemoglobin: 14.2 g/dL (ref 12.0–16.0)
LYMPHS PCT: 38 %
Lymphs Abs: 2.2 10*3/uL (ref 1.0–3.6)
MCH: 29.8 pg (ref 26.0–34.0)
MCHC: 32.9 g/dL (ref 32.0–36.0)
MCV: 90.5 fL (ref 80.0–100.0)
Monocytes Absolute: 0.5 10*3/uL (ref 0.2–0.9)
Monocytes Relative: 8 %
Neutro Abs: 3 10*3/uL (ref 1.4–6.5)
Neutrophils Relative %: 51 %
Platelets: 208 10*3/uL (ref 150–440)
RBC: 4.78 MIL/uL (ref 3.80–5.20)
RDW: 14.5 % (ref 11.5–14.5)
WBC: 5.9 10*3/uL (ref 3.6–11.0)

## 2014-11-29 MED ORDER — DIPHENHYDRAMINE HCL 25 MG PO CAPS
50.0000 mg | ORAL_CAPSULE | Freq: Once | ORAL | Status: AC
  Administered 2014-11-29: 25 mg via ORAL
  Filled 2014-11-29: qty 2

## 2014-11-29 MED ORDER — SODIUM CHLORIDE 0.9 % IV SOLN
6.0000 mg/kg | Freq: Once | INTRAVENOUS | Status: AC
  Administered 2014-11-29: 525 mg via INTRAVENOUS
  Filled 2014-11-29: qty 25

## 2014-11-29 MED ORDER — ACETAMINOPHEN 325 MG PO TABS
650.0000 mg | ORAL_TABLET | Freq: Once | ORAL | Status: AC
  Administered 2014-11-29: 650 mg via ORAL
  Filled 2014-11-29: qty 2

## 2014-11-29 MED ORDER — SODIUM CHLORIDE 0.9 % IV SOLN
Freq: Once | INTRAVENOUS | Status: AC
  Administered 2014-11-29: 15:00:00 via INTRAVENOUS
  Filled 2014-11-29: qty 1000

## 2014-11-29 MED ORDER — HEPARIN SOD (PORK) LOCK FLUSH 100 UNIT/ML IV SOLN
500.0000 [IU] | Freq: Once | INTRAVENOUS | Status: AC | PRN
  Administered 2014-11-29: 500 [IU]
  Filled 2014-11-29: qty 5

## 2014-11-29 MED ORDER — SODIUM CHLORIDE 0.9 % IJ SOLN
10.0000 mL | INTRAMUSCULAR | Status: DC | PRN
  Administered 2014-11-29: 10 mL
  Filled 2014-11-29: qty 10

## 2014-11-29 NOTE — Progress Notes (Signed)
Patient does have living will. Never smoked.

## 2014-11-30 ENCOUNTER — Encounter: Payer: Self-pay | Admitting: Oncology

## 2014-11-30 NOTE — Progress Notes (Signed)
[place] @ [facility] Telephone:([phone]) [phone]  [phone]) [phone]     SOLENNE MANWARREN OB: 01-06-1961  MR#: [id number]  VZD#:[id number]  Patient Care Team: Lavera Guise, MD as PCP - General (Internal Medicine) Robert Bellow, MD (General Surgery) Dion Body, MD as Referring Physician (Family Medicine)  CHIEF COMPLAINT:  Chief Complaint  Patient presents with  . Follow-up    Oncology History   1.carcinoma of breast(left) T1 C. N0 M0 status post ultrasound-guided biopsy Estrogen receptor positive. Progesterone receptor positive.  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 chemotherapy on hold since November because of cellulitis in the chest wall. 4.started on Herceptin May 22, 2014.  Cause of recurrent chest wall  infection chemotherapy was put on hold. 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  Patient is on multiple antibiotic and as per infectious disease specialist similar to stay on antibiotic for 4 months. So chemotherapy has been discontinued. 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   November 02, 2014 Patient is here for ongoing evaluation and treatment  consideration.  Patient is feeling weak and tired has low-grade fever.  Patient was seen by primary care physician because of increasing shortness of breath stress nuclear medicine echocardiogram is being planned.  Patient is cough shortness of breath patient was started on prednisone.  No chest pain. November 08, 2014 Patient is here for ongoing evaluation and treatment consideration.  Cough and shortness of breath is improved.  Patient had a CT scan of the chest which did not reveal any evidence of pulmonary embolism or pulmonary infiltrate. Nuclear stress MUGA scan also was within acceptable range.  Patient has improved on prednisone which is being tapered off Here for further evaluation and continuation of chemotherapy with maintenance Herceptin  November 29, 2014 Patient is here for ongoing evaluation and continuation of Herceptin therapy. No chills or fever.  Appetite has been stable.  No shortness of breath.  Patient is off all prednisone therapy no cough.  Here for continuation of treatment   REVIEW OF SYSTEMS:  Gen. status: General status is improved.  No chills or fever. HEENT: No headache no dizziness Lungs: Shortness of breath has improved.  Cough is improved.  No fever.   Cardiac: No chest pain or paroxysmal nocturnal dyspnea GI: No nausea no vomiting no diarrhea GU: No dysuria hematuria Musculoskeletal system no bony pain Skin: No rash Neurological system: No tingling numbness Past Medical History  Diagnosis Date  . Asthma   . Collapsed lung 2007  . Diffuse cystic mastopathy   . Arthritis 2006  . Sinus problem   . Cancer 2006    Renal cell carcinoma; cryosurgery treatment  . Breast cancer 2015    chemo,  herception  . Diabetes mellitus without complication 3532    Metformin    PAST SURGICAL HISTORY: Past Surgical History  Procedure Laterality Date  . Cesarean section  1991  . Cyst removal neck  10/2011    Dr. Tami Ribas  . Cholecystectomy  1990  . Cryoablation  2005, 2010    . Portacath placement  11/24/13  . Incision and drainage Left Dec 2015, Feb 2016    Dr Tula Nakayama  . Breast surgery Left 10/2011    Cyst Aspirationapocrine metaplasia, ductal cells and bone cells, hypo-cellular  . Breast surgery Left 2009    ADH on stereotactic biopsy, 1.5 mm focus.  . Breast surgery Left 2003    fibrocystic changes with ductal hyperplasia without atypia.  . Breast surgery Left     mastectomy  . Breast surgery Left April 11, 2014    Removal of implant, debridement chest wall Dr.Coan  . Breast biopsy Right 2005    negative  . Breast biopsy Left 2009    negative  . Breast biopsy Left 2015    positive  . Mastectomy Left 11/24/2013    positive    FAMILY HISTORY Family History  Problem Relation Age of Onset  . Breast cancer Paternal Aunt 71  . Ovarian cancer Maternal Grandmother   . Liver cancer Father   . Diabetes Mother   . Hypertension Mother          ADVANCED DIRECTIVES:  patient does have advance care directive    HEALTH MAINTENANCE: History  Substance Use Topics  . Smoking status: Never Smoker   . Smokeless tobacco: Never Used  . Alcohol Use: Yes     Comment: occasionally    :  Allergies  Allergen Reactions  . Linezolid Other (See Comments)  . Taxotere [Docetaxel] Anaphylaxis  . Floxin [Ofloxacin] Other (See Comments)    systemic  . Hydrocodone Itching  . Sulfa Antibiotics Other (See Comments)    blisters  . Codeine Rash  . Penicillins Rash  . Vancomycin Rash    Current Outpatient Prescriptions  Medication Sig Dispense Refill  . acetaminophen (TYLENOL) 325 MG tablet Take 650 mg by mouth every 6 (six) hours as needed for mild pain.    . Calcium Carb-Cholecalciferol (CALCIUM + D3 PO) Take by mouth daily.    . Canagliflozin (INVOKANA) 300 MG TABS Take 1 tablet by mouth daily.    . fluticasone (VERAMYST) 27.5 MCG/SPRAY nasal spray Place 2 sprays into the nose daily.    Marland Kitchen gabapentin (NEURONTIN) 400 MG capsule Take 1 capsule (400 mg total)  by mouth 3 (three) times daily. 90 capsule 4  . imipramine (TOFRANIL) 25 MG tablet Take 25 mg by mouth at bedtime.    Marland Kitchen letrozole (FEMARA) 2.5 MG tablet Take 2.5 mg by mouth daily.    Marland Kitchen levocetirizine (XYZAL) 5 MG tablet Take 5 mg by mouth every evening.    . meloxicam (MOBIC) 7.5 MG tablet Take 7.5 mg by mouth 2 (two) times daily.    . metFORMIN (GLUCOPHAGE) 500 MG tablet Take 500 mg by mouth QID.    Marland Kitchen Probiotic Product (PROBIOTIC DAILY PO) Take by mouth.    . sitaGLIPtin (JANUVIA) 50 MG tablet Take 50 mg by mouth daily.    . valsartan-hydrochlorothiazide (DIOVAN-HCT) 80-12.5 MG per tablet Take 1 tablet by mouth daily.    . canagliflozin (INVOKANA) 300 MG TABS tablet TAKE 1 TABLET BY MOUTH EVERY DAY    . predniSONE (STERAPRED UNI-PAK 48 TAB) 10 MG (48) TBPK  tablet TAKE BY MOUTH AS DIRECTED FOR 12 DAYS  0   No current facility-administered medications for this visit.    OBJECTIVE:  Filed Vitals:   11/29/14 1417  BP: 127/80  Pulse: 92  Temp: 97.3 F (36.3 C)     Body mass index is 33.02 kg/(m^2).    ECOG FS:1 - Symptomatic but completely ambulatory  PHYSICAL EXAM:general status: Patient is feeling stronger.  Fully active.  Not any acute distress HEENT:.  No evidence of stomatitis   lExamination of the chest was unremarkable. There were no bony deformities, no asymmetry, and no other abnormalities.  Cardiac exam revealed the PMI to be normally situated and sized. The rhythm was regular and no extrasystoles were noted during several minutes of auscultation. The first and second heart sounds were normal and physiologic splitting of the second heart sound was noted. There were no murmurs, rubs, clicks, or gallops. Examination of breasts: Right breast free of masses.  left breast wound is healed well.  No redness. Abdomen: Soft liver and spleen not palpable no ascites Examination of the skin revealed no evidence of significant rashes, suspicious appearing nevi or other concerning  lesions.. Lymphatic system: Supraclavicular, cervical, axillary, inguinal lymph nodes are not palpable Left chest wall area and no evidence of recurrent disease.  Wound is healed completely.  Right breast free of masses  Neurological system: Higher functions within normal limit.  No other localizing sign All other systems have been examined   LAB RESULTS:  Infusion on 11/29/2014  Component Date Value Ref Range Status  . WBC 11/29/2014 5.9  3.6 - 11.0 K/uL Final   A-LINE DRAW  . RBC 11/29/2014 4.78  3.80 - 5.20 MIL/uL Final  . Hemoglobin 11/29/2014 14.2  12.0 - 16.0 g/dL Final  . HCT 11/29/2014 43.2  35.0 - 47.0 % Final  . MCV 11/29/2014 90.5  80.0 - 100.0 fL Final  . MCH 11/29/2014 29.8  26.0 - 34.0 pg Final  . MCHC 11/29/2014 32.9  32.0 - 36.0 g/dL Final  . RDW 11/29/2014 14.5  11.5 - 14.5 % Final  . Platelets 11/29/2014 208  150 - 440 K/uL Final  . Neutrophils Relative % 11/29/2014 51   Final  . Neutro Abs 11/29/2014 3.0  1.4 - 6.5 K/uL Final  . Lymphocytes Relative 11/29/2014 38   Final  . Lymphs Abs 11/29/2014 2.2  1.0 - 3.6 K/uL Final  . Monocytes Relative 11/29/2014 8   Final  . Monocytes Absolute 11/29/2014 0.5  0.2 - 0.9 K/uL Final  . Eosinophils Relative 11/29/2014 3   Final  . Eosinophils Absolute 11/29/2014 0.2  0 - 0.7 K/uL Final  . Basophils Relative 11/29/2014 0   Final  . Basophils Absolute 11/29/2014 0.0  0 - 0.1 K/uL Final  . Sodium 11/29/2014 136  135 - 145 mmol/L Final  . Potassium 11/29/2014 3.5  3.5 - 5.1 mmol/L Final  . Chloride 11/29/2014 101  101 - 111 mmol/L Final  . CO2 11/29/2014 29  22 - 32 mmol/L Final  . Glucose, Bld 11/29/2014 176* 65 - 99 mg/dL Final  . BUN 11/29/2014 13  6 - 20 mg/dL Final  . Creatinine, Ser 11/29/2014 0.61  0.44 - 1.00 mg/dL Final  . Calcium 11/29/2014 8.9  8.9 - 10.3 mg/dL Final  . Total Protein 11/29/2014 7.3  6.5 - 8.1 g/dL Final  . Albumin 11/29/2014 4.1  3.5 - 5.0 g/dL Final  . AST 11/29/2014 38  15 - 41 U/L Final  .  ALT 11/29/2014 46  14 - 54 U/L Final  . Alkaline Phosphatase 11/29/2014 68  38 - 126 U/L Final  . Total Bilirubin 11/29/2014 0.7  0.3 - 1.2 mg/dL Final  . GFR calc non Af Amer 11/29/2014 >60  >60 mL/min Final  . GFR calc Af Amer 11/29/2014 >60  >60 mL/min Final   Comment: (NOTE) The eGFR has been calculated using the CKD EPI equation. This calculation has not been validated in all clinical situations. eGFR's persistently <60 mL/min signify possible Chronic Kidney Disease.   . Anion gap 11/29/2014 6  5 - 15 Final    Lab Results  Component Value Date   LABCA2 29.7 10/14/2013   No results found for: CA199 Lab Results  Component Value Date   CEA 1.8 10/14/2013     STUDIES: Dg Chest 2 View  11/01/2014   CLINICAL DATA:  Shortness of breath and productive cough for 1 month. Midsternal chest pain.  EXAM: CHEST  2 VIEW  COMPARISON:  Chest x-ray dated 04/19/2014 and CT scan dated 04/20/2014  FINDINGS: Power port in place with the tip in the right atrium, unchanged. Previous left mastectomy.  Heart size and pulmonary vascularity are normal. The lungs are clear. No acute osseous abnormality.  IMPRESSION: No acute abnormalities.   Electronically Signed   By: Lorriane Shire M.D.   On: 11/01/2014 13:39   Ct Angio Chest Pe W/cm &/or Wo Cm  11/02/2014   CLINICAL DATA:  Short of breath, history of left breast carcinoma, now with some mid sternal chest pain  EXAM: CT ANGIOGRAPHY CHEST WITH CONTRAST  TECHNIQUE: Multidetector CT imaging of the chest was performed using the standard protocol during bolus administration of intravenous contrast. Multiplanar CT image reconstructions and MIPs were obtained to evaluate the vascular anatomy.  CONTRAST:  77m OMNIPAQUE IOHEXOL 350 MG/ML SOLN  COMPARISON:  Chest x-ray of 11/01/2014 and CT chest of 04/20/2014  FINDINGS: The pulmonary arteries are well opacified. There is no evidence of pulmonary embolism. The thoracic aorta also opacifies with no significant  abnormality noted. The origins of the great vessels appear patent. No mediastinal or hilar adenopathy is seen. The heart is mildly enlarged. No abnormality of the upper abdomen is seen. The thyroid gland is unremarkable.  On lung window images, no parenchymal infiltrate is seen. There is no evidence of pleural effusion. No suspicious lung nodule or mass is seen. The central airway is patent. There are degenerative changes in the mid to lower thoracic spine. Changes of a left mastectomy are noted.  Review of the MIP images confirms the above findings.  IMPRESSION: 1. No evidence of pulmonary embolism. 2. No infiltrate, effusion, or adenopathy is seen.   Electronically Signed   By: PIvar DrapeM.D.   On: 11/02/2014 15:29   Nm Myocar Multi W/spect W/wall Motion / Ef  11/06/2014    There was no ST segment deviation noted during stress.   Gated imaging revealed normal left ventricular function. SPECT imaging did  not reveal fixed or reversible defects. No evidence for scar or ischemia.    ASSESSMENT  Cancer of breast stage IC Continue Herceptin therapy Next treatment would be the last Herceptin therapy  MEDICA [place] DECISION MAKING:  Last Herceptin therapy would be the next treatment.  Patient will continue letrozole for total [place]  guidelines for follow-up in breast cancer patient History and physical 1-4 times per year as clinically indicated for first 5 years Periodic screening for changes in the  family history and referable to genetic counseling is necessary. Mammographic every 12 months Educated monitor and refer patient for lymphedema management if needed Routine imaging of reconstructed breasts is not indicated In absence of clinical signs and symptoms suggestive of recurrent disease there is no indication for lab or imaging studies for metastatic screening Recommend on tamoxifen needs annual GYN evaluation if uterus is present.  Patient on aromatase inhibitor needs bone density study  for evaluation regarding osteoporosis Evidence suggests that active lifestyle healthy diet limited alcohol intake and achieving i and maintaining ideal body weight may lead to optimal breast cancer outcome Breast cancer of upper-outer quadrant of left female breast   Staging form: Breast, AJCC 7th Edition     Clinical: Stage IA (T1c, N0, M0) - Marni Griffon, MD   11/30/2014 7:59 AM

## 2014-12-02 ENCOUNTER — Other Ambulatory Visit: Payer: Self-pay | Admitting: Oncology

## 2014-12-07 ENCOUNTER — Telehealth: Payer: Self-pay | Admitting: *Deleted

## 2014-12-07 ENCOUNTER — Inpatient Hospital Stay (HOSPITAL_BASED_OUTPATIENT_CLINIC_OR_DEPARTMENT_OTHER): Payer: BLUE CROSS/BLUE SHIELD | Admitting: Oncology

## 2014-12-07 VITALS — BP 124/78 | HR 98 | Temp 97.4°F | Wt 196.9 lb

## 2014-12-07 DIAGNOSIS — B9689 Other specified bacterial agents as the cause of diseases classified elsewhere: Secondary | ICD-10-CM | POA: Diagnosis not present

## 2014-12-07 DIAGNOSIS — Z79899 Other long term (current) drug therapy: Secondary | ICD-10-CM

## 2014-12-07 DIAGNOSIS — Z170 Estrogen receptor positive status [ER+]: Secondary | ICD-10-CM

## 2014-12-07 DIAGNOSIS — Z9012 Acquired absence of left breast and nipple: Secondary | ICD-10-CM

## 2014-12-07 DIAGNOSIS — C50412 Malignant neoplasm of upper-outer quadrant of left female breast: Secondary | ICD-10-CM | POA: Diagnosis not present

## 2014-12-07 DIAGNOSIS — L03313 Cellulitis of chest wall: Secondary | ICD-10-CM | POA: Diagnosis not present

## 2014-12-07 DIAGNOSIS — Z85528 Personal history of other malignant neoplasm of kidney: Secondary | ICD-10-CM

## 2014-12-07 DIAGNOSIS — E119 Type 2 diabetes mellitus without complications: Secondary | ICD-10-CM

## 2014-12-07 DIAGNOSIS — Z79811 Long term (current) use of aromatase inhibitors: Secondary | ICD-10-CM

## 2014-12-07 DIAGNOSIS — M1990 Unspecified osteoarthritis, unspecified site: Secondary | ICD-10-CM

## 2014-12-07 MED ORDER — CEPHALEXIN 500 MG PO CAPS: 500.0000 mg | ORAL_CAPSULE | Freq: Three times a day (TID) | ORAL | Status: DC

## 2014-12-07 NOTE — Telephone Encounter (Signed)
Pt on her way for appt

## 2014-12-07 NOTE — Progress Notes (Signed)
Patient does  have living will.  Never smoked.  Patient acute add on for knot above port site and tenderness.  Noticed it a week ago.  Today it looked red to her and states it has become more tender.

## 2014-12-07 NOTE — Telephone Encounter (Signed)
Called to report that she has developed a blister on the incision line of her port site, She has had the port for a year and her last chemo was on the 20th. After discussing with Dr Oliva Bustard, I called patient back and left VM that he wants to see her this afternoon and to call back to schedule an appt

## 2014-12-08 ENCOUNTER — Encounter: Payer: Self-pay | Admitting: Oncology

## 2014-12-08 NOTE — Progress Notes (Signed)
[place] @ [facility] Telephone:(336) [phone]  Fax:(336) [phone]     MALAKA RUFFNER OB: July 31, 1960  MR#: [id number]  QIH#:[id number]  Patient Care Team: Lavera Guise, MD as PCP - General (Internal Medicine) Robert Bellow, MD (General Surgery) Dion Body, MD as Referring Physician (Family Medicine)  CHIEF COMPLAINT:  Chief Complaint  Patient presents with  . Acute Visit    Oncology History   1.carcinoma of breast(left) T1 C. N0 M0 status post ultrasound-guided biopsy Estrogen receptor positive. Progesterone receptor positive.  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 chemotherapy on hold since November because of cellulitis in the chest wall. 4.started on Herceptin May 22, 2014.  Cause of recurrent chest wall  infection chemotherapy was put on hold. 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  Patient is on multiple antibiotic and as per infectious disease specialist similar to stay on antibiotic for 4 months. So chemotherapy has been discontinued. 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   November 02, 2014 Patient is here for ongoing evaluation and treatment  consideration.  Patient is feeling weak and tired has low-grade fever.  Patient was seen by primary care physician because of increasing shortness of breath stress nuclear medicine echocardiogram is being planned.  Patient is cough shortness of breath patient was started on prednisone.  No chest pain. November 08, 2014 Patient is here for ongoing evaluation and treatment consideration.  Cough and shortness of breath is improved.  Patient had a CT scan of the chest which did not reveal any evidence of pulmonary embolism or pulmonary infiltrate. Nuclear stress MUGA scan also was within acceptable range.  Patient has improved on prednisone which is being tapered off Here for further evaluation and continuation of chemotherapy with maintenance Herceptin  November 29, 2014 Patient is here for ongoing evaluation and continuation of Herceptin therapy. No chills or fever.  Appetite has been stable.  No shortness of breath.  Patient is off all prednisone therapy no cough.  Here for continuation of treatment  December 07, 2014 Patient came today as an acute abdominal complaining of left upper chest wall tenderness.  According to her tenderness started about the port site.  No chills.  No fever.  REVIEW OF SYSTEMS:  Gen. status: General status is improved.  No chills or fever. HEENT: No headache no dizziness Lungs: Shortness of breath has improved.  Cough is improved.  No fever.   Cardiac: No chest pain or paroxysmal nocturnal dyspnea GI: No nausea no vomiting no diarrhea GU: No dysuria hematuria Musculoskeletal system no bony pain  Skin: No rash Neurological system: No tingling numbness Chest wall tenderness at the port site about port site.  Slight redness at the previous  incision site Past Medical History  Diagnosis Date  . Asthma   . Collapsed lung 2007  . Diffuse cystic mastopathy   . Arthritis 2006  . Sinus problem   . Cancer 2006    Renal cell carcinoma; cryosurgery treatment  . Breast cancer 2015      chemo, herception  . Diabetes mellitus without complication 6270    Metformin    PAST SURGICAL HISTORY: Past Surgical History  Procedure Laterality Date  . Cesarean section  1991  . Cyst removal neck  10/2011    Dr. Tami Ribas  . Cholecystectomy  1990  . Cryoablation  2005, 2010  . Portacath placement  11/24/13  . Incision and drainage Left Dec 2015, Feb 2016    Dr Tula Nakayama  . Breast surgery Left 10/2011    Cyst Aspirationapocrine metaplasia, ductal cells and bone cells, hypo-cellular  . Breast surgery Left 2009    ADH on stereotactic biopsy, 1.5 mm focus.  . Breast surgery Left 2003    fibrocystic changes with ductal hyperplasia without atypia.  . Breast surgery Left     mastectomy  . Breast surgery Left April 11, 2014    Removal of implant, debridement chest wall Dr.Coan  . Breast biopsy Right 2005    negative  . Breast biopsy Left 2009    negative  . Breast biopsy Left 2015    positive  . Mastectomy Left 11/24/2013    positive    FAMILY HISTORY Family History  Problem Relation Age of Onset  . Breast cancer Paternal Aunt 77  . Ovarian cancer Maternal Grandmother   . Liver cancer Father   . Diabetes Mother   . Hypertension Mother          ADVANCED DIRECTIVES:  patient does have advance care directive    HEALTH MAINTENANCE: History  Substance Use Topics  . Smoking status: Never Smoker   . Smokeless tobacco: Never Used  . Alcohol Use: Yes     Comment: occasionally    :  Allergies  Allergen Reactions  . Linezolid Other (See Comments)  . Taxotere [Docetaxel] Anaphylaxis  . Floxin [Ofloxacin] Other (See Comments)    systemic  . Hydrocodone Itching  . Sulfa Antibiotics Other (See Comments)    blisters  . Codeine Rash  . Penicillins Rash  . Vancomycin Rash    Current Outpatient Prescriptions  Medication Sig Dispense Refill  . acetaminophen (TYLENOL) 325 MG tablet Take 650 mg by mouth every 6 (six) hours as needed for mild pain.    . Calcium  Carb-Cholecalciferol (CALCIUM + D3 PO) Take by mouth daily.    . Calcium Carb-Cholecalciferol (CALCIUM + D3) 600-200 MG-UNIT TABS TAKE 1 TABLET BY MOUTH TWICE A DAY 60 tablet 5  . canagliflozin (INVOKANA) 300 MG TABS tablet TAKE 1 TABLET BY MOUTH EVERY DAY    . Canagliflozin (INVOKANA) 300 MG TABS Take 1 tablet by mouth daily.    . fluticasone (VERAMYST) 27.5 MCG/SPRAY nasal spray Place 2 sprays into the nose daily.    Marland Kitchen gabapentin (NEURONTIN) 400 MG capsule Take 1 capsule (400 mg total) by mouth 3 (three) times daily. 90 capsule 4  . imipramine (TOFRANIL) 25 MG tablet Take 25 mg by mouth at bedtime.    Marland Kitchen letrozole (FEMARA) 2.5 MG tablet TAKE 1 TABLET BY MOUTH EVERY DAY 30 tablet 5  . levocetirizine (XYZAL) 5 MG tablet Take 5 mg by mouth every evening.    . meloxicam (MOBIC) 7.5 MG tablet Take 7.5 mg by  mouth 2 (two) times daily.    . metFORMIN (GLUCOPHAGE) 500 MG tablet Take 500 mg by mouth QID.    Marland Kitchen Probiotic Product (PROBIOTIC DAILY PO) Take by mouth.    . sitaGLIPtin (JANUVIA) 50 MG tablet Take 50 mg by mouth daily.    . valsartan-hydrochlorothiazide (DIOVAN-HCT) 80-12.5 MG per tablet Take 1 tablet by mouth daily.    . cephALEXin (KEFLEX) 500 MG capsule Take 1 capsule (500 mg total) by mouth 3 (three) times daily. 21 capsule 0   No current facility-administered medications for this visit.    OBJECTIVE:  Filed Vitals:   12/07/14 1505  BP: 124/78  Pulse: 98  Temp: 97.4 F (36.3 C)     Body mass index is 32.76 kg/(m^2).    ECOG FS:1 - Symptomatic but completely ambulatory  PHYSICAL EXAM:general status: Patient is feeling stronger.  Fully active.  Not any acute distress HEENT:.  No evidence of stomatitis   lExamination of the chest was unremarkable. There were no bony deformities, no asymmetry, and no other abnormalities.  Cardiac exam revealed the PMI to be normally situated and sized. The rhythm was regular and no extrasystoles were noted during several minutes of auscultation. The  first and second heart sounds were normal and physiologic splitting of the second heart sound was noted. There were no murmurs, rubs, clicks, or gallops. Examination of breasts: Right breast free of masses.  left breast wound is healed well.  No redness. Abdomen: Soft liver and spleen not palpable no ascites Examination of the skin revealed no evidence of significant rashes, suspicious appearing nevi or other concerning lesions.. Lymphatic system: Supraclavicular, cervical, axillary, inguinal lymph nodes are not palpable Left chest wall area and no evidence of recurrent disease.  Wound is healed completely.  Right breast free of masses  Redness and tenderness in the chest wall area Neurological system: Higher functions within normal limit.  No other localizing sign All other systems have been examined   LAB RESULTS:  No visits with results within 3 Day(s) from this visit. Latest known visit with results is:  Infusion on 11/29/2014  Component Date Value Ref Range Status  . WBC 11/29/2014 5.9  3.6 - 11.0 K/uL Final   A-LINE DRAW  . RBC 11/29/2014 4.78  3.80 - 5.20 MIL/uL Final  . Hemoglobin 11/29/2014 14.2  12.0 - 16.0 g/dL Final  . HCT 11/29/2014 43.2  35.0 - 47.0 % Final  . MCV 11/29/2014 90.5  80.0 - 100.0 fL Final  . MCH 11/29/2014 29.8  26.0 - 34.0 pg Final  . MCHC 11/29/2014 32.9  32.0 - 36.0 g/dL Final  . RDW 11/29/2014 14.5  11.5 - 14.5 % Final  . Platelets 11/29/2014 208  150 - 440 K/uL Final  . Neutrophils Relative % 11/29/2014 51   Final  . Neutro Abs 11/29/2014 3.0  1.4 - 6.5 K/uL Final  . Lymphocytes Relative 11/29/2014 38   Final  . Lymphs Abs 11/29/2014 2.2  1.0 - 3.6 K/uL Final  . Monocytes Relative 11/29/2014 8   Final  . Monocytes Absolute 11/29/2014 0.5  0.2 - 0.9 K/uL Final  . Eosinophils Relative 11/29/2014 3   Final  . Eosinophils Absolute 11/29/2014 0.2  0 - 0.7 K/uL Final  . Basophils Relative 11/29/2014 0   Final  . Basophils Absolute 11/29/2014 0.0  0 -  0.1 K/uL Final  . Sodium 11/29/2014 136  135 - 145 mmol/L Final  . Potassium 11/29/2014 3.5  3.5 - 5.1 mmol/L Final  .  Chloride 11/29/2014 101  101 - 111 mmol/L Final  . CO2 11/29/2014 29  22 - 32 mmol/L Final  . Glucose, Bld 11/29/2014 176* 65 - 99 mg/dL Final  . BUN 11/29/2014 13  6 - 20 mg/dL Final  . Creatinine, Ser 11/29/2014 0.61  0.44 - 1.00 mg/dL Final  . Calcium 11/29/2014 8.9  8.9 - 10.3 mg/dL Final  . Total Protein 11/29/2014 7.3  6.5 - 8.1 g/dL Final  . Albumin 11/29/2014 4.1  3.5 - 5.0 g/dL Final  . AST 11/29/2014 38  15 - 41 U/L Final  . ALT 11/29/2014 46  14 - 54 U/L Final  . Alkaline Phosphatase 11/29/2014 68  38 - 126 U/L Final  . Total Bilirubin 11/29/2014 0.7  0.3 - 1.2 mg/dL Final  . GFR calc non Af Amer 11/29/2014 >60  >60 mL/min Final  . GFR calc Af Amer 11/29/2014 >60  >60 mL/min Final   Comment: (NOTE) The eGFR has been calculated using the CKD EPI equation. This calculation has not been validated in all clinical situations. eGFR's persistently <60 mL/min signify possible Chronic Kidney Disease.   . Anion gap 11/29/2014 6  5 - 15 Final    Lab Results  Component Value Date   LABCA2 29.7 10/14/2013   No results found for: CA199 Lab Results  Component Value Date   CEA 1.8 10/14/2013     STUDIES: No results found.  ASSESSMENT December 07, 2014 Most likely infection at the incision site at the previous port placement.  Flex 500 mg 3 times a day for 7 days if that is no improvement or patient's start spiking fever port could be removed.  Cancer of breast stage IC Continue Herceptin therapy Next treatment would be the last Herceptin therapy  MEDICA L DECISION MAKING:  Last Herceptin therapy would be the next treatment.  Patient will continue letrozole for total 5 years Daisy N  guidelines for follow-up in breast cancer patient History and physical 1-4 times per year as clinically indicated for first 5 years Periodic screening for changes in the family  history and referable to genetic counseling is necessary. Mammographic every 12 months Educated monitor and refer patient for lymphedema management if needed Routine imaging of reconstructed breasts is not indicated In absence of clinical signs and symptoms suggestive of recurrent disease there is no indication for lab or imaging studies for metastatic screening Recommend on tamoxifen needs annual GYN evaluation if uterus is present.  Patient on aromatase inhibitor needs bone density study for evaluation regarding osteoporosis Evidence suggests that active lifestyle healthy diet limited alcohol intake and achieving i and maintaining ideal body weight may lead to optimal breast cancer outcome Breast cancer of upper-outer quadrant of left female breast   Staging form: Breast, AJCC 7th Edition     Clinical: Stage IA (T1c, N0, M0) - Marni Griffon, MD   12/08/2014 5:09 PM

## 2014-12-11 ENCOUNTER — Telehealth: Payer: Self-pay | Admitting: *Deleted

## 2014-12-11 NOTE — Telephone Encounter (Signed)
reports she is to have port removed wed, but thinks it needs to come on out now, as she is "feverish" She reports she has a subnormal temp and it has been ruuning 98.5 when her norm is 96. She then told me not to worry about it because she is seeing Dr Ola Spurr in the morning

## 2014-12-12 ENCOUNTER — Telehealth: Payer: Self-pay | Admitting: *Deleted

## 2014-12-12 DIAGNOSIS — T80219A Unspecified infection due to central venous catheter, initial encounter: Secondary | ICD-10-CM | POA: Insufficient documentation

## 2014-12-12 NOTE — Telephone Encounter (Signed)
Dr Bary Castilla office called about needs order to remove port for pt.  Faxed request to their office.

## 2014-12-13 ENCOUNTER — Ambulatory Visit (INDEPENDENT_AMBULATORY_CARE_PROVIDER_SITE_OTHER): Payer: BLUE CROSS/BLUE SHIELD | Admitting: General Surgery

## 2014-12-13 ENCOUNTER — Encounter: Payer: Self-pay | Admitting: General Surgery

## 2014-12-13 DIAGNOSIS — T80219A Unspecified infection due to central venous catheter, initial encounter: Secondary | ICD-10-CM

## 2014-12-13 DIAGNOSIS — C50412 Malignant neoplasm of upper-outer quadrant of left female breast: Secondary | ICD-10-CM | POA: Diagnosis not present

## 2014-12-13 NOTE — Patient Instructions (Addendum)
The patient is aware to call back for any questions or concerns. Keep area clean

## 2014-12-13 NOTE — Progress Notes (Signed)
Patient ID: Danielle Wallace, female   DOB: [birth year], 54 y.o.   MRN: [id number]  Chief Complaint  Patient presents with  . Other    eval infected port    HPI Danielle Wallace is a 54 y.o. female here today for a port removal. The patient reports that she noted some faint changes of coloration around the port proximally a week ago. She was placed on Keflex by Dr. Oliva Bustard. This weekend she had fever and chills in spite of the antibiotic.   She did see Dr. Ola Spurr from infectious disease yesterday and she started doxycycline after he had obtained blood cultures as well as culture from the port site. She states it started turning red last week, and subsequently developed an opening along the original incision.   HPI  Past Medical History  Diagnosis Date  . Asthma   . Collapsed lung 2007  . Diffuse cystic mastopathy   . Arthritis 2006  . Sinus problem   . Cancer 2006    Renal cell carcinoma; cryosurgery treatment  . Breast cancer 2015    chemo, herception  . Diabetes mellitus without complication 0947    Metformin    Past Surgical History  Procedure Laterality Date  . Cesarean section  1991  . Cyst removal neck  10/2011    Dr. Tami Ribas  . Cholecystectomy  1990  . Cryoablation  2005, 2010  . Portacath placement  11/24/13  . Incision and drainage Left Dec 2015, Feb 2016    Dr Tula Nakayama  . Breast surgery Left 10/2011    Cyst Aspirationapocrine metaplasia, ductal cells and bone cells, hypo-cellular  . Breast surgery Left 2009    ADH on stereotactic biopsy, 1.5 mm focus.  . Breast surgery Left 2003    fibrocystic changes with ductal hyperplasia without atypia.  . Breast surgery Left     mastectomy  . Breast surgery Left April 11, 2014    Removal of implant, debridement chest wall Dr.Coan  . Breast biopsy Right 2005    negative  . Breast biopsy Left 2009    negative  . Breast biopsy Left 2015    positive  . Mastectomy Left 11/24/2013    positive    Family History  Problem  Relation Age of Onset  . Breast cancer Paternal Aunt 45  . Ovarian cancer Maternal Grandmother   . Liver cancer Father   . Diabetes Mother   . Hypertension Mother     Social History History  Substance Use Topics  . Smoking status: Never Smoker   . Smokeless tobacco: Never Used  . Alcohol Use: Yes     Comment: occasionally    Allergies  Allergen Reactions  . Linezolid Other (See Comments)  . Taxotere [Docetaxel] Anaphylaxis  . Floxin [Ofloxacin] Other (See Comments)    systemic  . Hydrocodone Itching  . Sulfa Antibiotics Other (See Comments)    blisters  . Codeine Rash  . Penicillins Rash  . Vancomycin Rash    Current Outpatient Prescriptions  Medication Sig Dispense Refill  . doxycycline (VIBRAMYCIN) 100 MG capsule Take by mouth 2 (two) times daily.    . Trastuzumab (HERCEPTIN IV) Inject into the vein.    Marland Kitchen acetaminophen (TYLENOL) 325 MG tablet Take 650 mg by mouth every 6 (six) hours as needed for mild pain.    . Calcium Carb-Cholecalciferol (CALCIUM + D3) 600-200 MG-UNIT TABS TAKE 1 TABLET BY MOUTH TWICE A DAY 60 tablet 5  . canagliflozin (INVOKANA) 300 MG TABS tablet  TAKE 1 TABLET BY MOUTH EVERY DAY    . cephALEXin (KEFLEX) 500 MG capsule Take 1 capsule (500 mg total) by mouth 3 (three) times daily. 21 capsule 0  . fluticasone (VERAMYST) 27.5 MCG/SPRAY nasal spray Place 2 sprays into the nose daily.    Marland Kitchen gabapentin (NEURONTIN) 400 MG capsule Take 1 capsule (400 mg total) by mouth 3 (three) times daily. 90 capsule 4  . imipramine (TOFRANIL) 25 MG tablet Take 25 mg by mouth at bedtime.    Marland Kitchen letrozole (FEMARA) 2.5 MG tablet TAKE 1 TABLET BY MOUTH EVERY DAY 30 tablet 5  . levocetirizine (XYZAL) 5 MG tablet Take 5 mg by mouth every evening.    . meloxicam (MOBIC) 7.5 MG tablet Take 7.5 mg by mouth 2 (two) times daily.    . metFORMIN (GLUCOPHAGE) 500 MG tablet Take 500 mg by mouth QID.    Marland Kitchen Probiotic Product (PROBIOTIC DAILY PO) Take by mouth.    . sitaGLIPtin (JANUVIA) 50  MG tablet Take 50 mg by mouth daily.    . valsartan-hydrochlorothiazide (DIOVAN-HCT) 80-12.5 MG per tablet Take 1 tablet by mouth daily.     No current facility-administered medications for this visit.    Review of Systems Review of Systems  Constitutional: Positive for fever.  Respiratory: Negative.   Cardiovascular: Negative.     Blood pressure 142/88, pulse 86, temperature 97.1 F (36.2 C), temperature source Oral, resp. rate 14, height 5' 5"  (1.651 m), weight 198 lb (89.812 kg).  Physical Exam Physical Exam  Constitutional: She is oriented to person, place, and time. She appears well-developed and well-nourished.  Pulmonary/Chest:    Neurological: She is alert and oriented to person, place, and time.  Skin: Skin is warm and dry.  Psychiatric: Her behavior is normal.      Assessment    Exposed venous access port.    Plan    It was elected to remove the port. The area was cleansed with Betadine solution. 10 mL of 0.5% Xylocaine with 0.25% Marcaine with 1-200,000 units of epinephrine was utilized well tolerated. The area was reprepped with Betadine solution. The original incision was opened and the port extracted without incident. Cultures were obtained from the cavity. Gauze debridement was undertaken. The entire catheter tip was removed intact. No bleeding was noted. The skin defect was approximated medially and laterally with 4-0 nylon horizontal mattress sutures. The central aspect of the wound was left open. Telfa followed by a Tegaderm dressing was applied.  The patient will leave the present dressing on until Friday when she'll return for dressing change at the office. She may shower. She'll continue her previously prescribed antibiotic. Physician exam in one week.      PCP:  Vanetta Shawl 12/14/2014, 6:09 AM

## 2014-12-14 DIAGNOSIS — T80219A Unspecified infection due to central venous catheter, initial encounter: Secondary | ICD-10-CM | POA: Insufficient documentation

## 2014-12-15 ENCOUNTER — Ambulatory Visit (INDEPENDENT_AMBULATORY_CARE_PROVIDER_SITE_OTHER): Payer: BLUE CROSS/BLUE SHIELD

## 2014-12-15 DIAGNOSIS — T80219A Unspecified infection due to central venous catheter, initial encounter: Secondary | ICD-10-CM

## 2014-12-15 NOTE — Patient Instructions (Signed)
You may shower. Call with any questions. Return on 12/19/14 for MD follow up.

## 2014-12-15 NOTE — Progress Notes (Signed)
Patient ID: Danielle Wallace, female   DOB: [birth year], 54 y.o.   MRN: [id number] Patient here for a check of her port removal site. Port removal completed on 12/13/14. Dressing removed and moderate amount of dried blood noted on underlying Telfa. No active bleeding or drainage noted. Wound edges clear. Dressing applied. Patient to return on 12/19/14 for MD follow up.

## 2014-12-19 ENCOUNTER — Ambulatory Visit: Payer: BLUE CROSS/BLUE SHIELD | Admitting: General Surgery

## 2014-12-19 ENCOUNTER — Encounter: Payer: Self-pay | Admitting: General Surgery

## 2014-12-19 VITALS — BP 128/72 | HR 88 | Temp 97.9°F | Resp 14 | Ht 65.0 in

## 2014-12-19 DIAGNOSIS — T80219A Unspecified infection due to central venous catheter, initial encounter: Secondary | ICD-10-CM

## 2014-12-19 LAB — ANAEROBIC AND AEROBIC CULTURE

## 2014-12-19 NOTE — Progress Notes (Signed)
.Patient ID: Danielle Wallace, female   DOB: 01-Mar-1961, 54 y.o.   MRN: [id number]  Chief Complaint  Patient presents with  . Follow-up    port removal    HPI Danielle Wallace is a 54 y.o. female here today for her follow up port removal done on 12/13/14. She is now in Cipro per Dr Ola Spurr, the culture showed Pseudomonas aeruginosa. She states she has still had a few chills. Presently on Cipro under the care of Dr. Ola Spurr from infectious disease.  HPI  Past Medical History  Diagnosis Date  . Asthma   . Collapsed lung 2007  . Diffuse cystic mastopathy   . Arthritis 2006  . Sinus problem   . Cancer 2006    Renal cell carcinoma; cryosurgery treatment  . Breast cancer 2015    chemo, herception  . Diabetes mellitus without complication 5681    Metformin    Past Surgical History  Procedure Laterality Date  . Cesarean section  1991  . Cyst removal neck  10/2011    Dr. Tami Ribas  . Cholecystectomy  1990  . Cryoablation  2005, 2010  . Portacath placement  11/24/13  . Incision and drainage Left Dec 2015, Feb 2016    Dr Tula Nakayama  . Breast surgery Left 10/2011    Cyst Aspirationapocrine metaplasia, ductal cells and bone cells, hypo-cellular  . Breast surgery Left 2009    ADH on stereotactic biopsy, 1.5 mm focus.  . Breast surgery Left 2003    fibrocystic changes with ductal hyperplasia without atypia.  . Breast surgery Left     mastectomy  . Breast surgery Left April 11, 2014    Removal of implant, debridement chest wall Dr.Coan  . Breast biopsy Right 2005    negative  . Breast biopsy Left 2009    negative  . Breast biopsy Left 2015    positive  . Mastectomy Left 11/24/2013    positive    Family History  Problem Relation Age of Onset  . Breast cancer Paternal Aunt 20  . Ovarian cancer Maternal Grandmother   . Liver cancer Father   . Diabetes Mother   . Hypertension Mother     Social History Social History  Substance Use Topics  . Smoking status: Never Smoker   .  Smokeless tobacco: Never Used  . Alcohol Use: Yes     Comment: occasionally    Allergies  Allergen Reactions  . Linezolid Other (See Comments)  . Taxotere [Docetaxel] Anaphylaxis  . Floxin [Ofloxacin] Other (See Comments)    systemic  . Hydrocodone Itching  . Sulfa Antibiotics Other (See Comments)    blisters  . Codeine Rash  . Penicillins Rash  . Vancomycin Rash    Current Outpatient Prescriptions  Medication Sig Dispense Refill  . acetaminophen (TYLENOL) 325 MG tablet Take 650 mg by mouth every 6 (six) hours as needed for mild pain.    . Calcium Carb-Cholecalciferol (CALCIUM + D3) 600-200 MG-UNIT TABS TAKE 1 TABLET BY MOUTH TWICE A DAY 60 tablet 5  . canagliflozin (INVOKANA) 300 MG TABS tablet TAKE 1 TABLET BY MOUTH EVERY DAY    . ciprofloxacin (CIPRO) 500 MG tablet Take 500 mg by mouth 2 (two) times daily.    . fluticasone (VERAMYST) 27.5 MCG/SPRAY nasal spray Place 2 sprays into the nose daily.    Marland Kitchen gabapentin (NEURONTIN) 400 MG capsule Take 1 capsule (400 mg total) by mouth 3 (three) times daily. 90 capsule 4  . imipramine (TOFRANIL) 25 MG tablet  Take 25 mg by mouth at bedtime.    Marland Kitchen letrozole (FEMARA) 2.5 MG tablet TAKE 1 TABLET BY MOUTH EVERY DAY 30 tablet 5  . levocetirizine (XYZAL) 5 MG tablet Take 5 mg by mouth every evening.    . meloxicam (MOBIC) 7.5 MG tablet Take 7.5 mg by mouth 2 (two) times daily.    . metFORMIN (GLUCOPHAGE) 500 MG tablet Take 500 mg by mouth QID.    Marland Kitchen Probiotic Product (PROBIOTIC DAILY PO) Take by mouth.    . sitaGLIPtin (JANUVIA) 50 MG tablet Take 50 mg by mouth daily.    . Trastuzumab (HERCEPTIN IV) Inject into the vein.    . valsartan-hydrochlorothiazide (DIOVAN-HCT) 80-12.5 MG per tablet Take 1 tablet by mouth daily.     No current facility-administered medications for this visit.    Review of Systems Review of Systems  Constitutional: Negative.   Respiratory: Negative.   Cardiovascular: Negative.   Neurological: Positive for  headaches.    Blood pressure 128/72, pulse 88, temperature 97.9 F (36.6 C), temperature source Oral, resp. rate 14, height 5' 5"  (1.651 m).  Physical Exam Physical Exam  Constitutional: She appears well-developed.  Neurological: She is alert.  Skin: Skin is warm.  Port site sutures was removed and 2cc  old blood removed.  No significant erythema or tenderness. The central portion of the port excision site wound remains open for drainage. Data Reviewed Notes Recorded by Robert Bellow, MD on 12/20/2014 at 12:23 PM Patient is on Cipro under Dr. Ola Spurr.     9d ago    Anaerobic Culture Final report   Result 1 Comment   Comments: No anaerobic growth in 72 hours.   Aerobic Culture Final report (A)   Result 1 Comment (A)   Comments: Pseudomonas aeruginosa  Heavy growth     Result 2 Comment (A)   Comments: Pseudomonas aeruginosa  Heavy growth     Antimicrobial Susceptibility Comment   Comments:   ** S = Susceptible; I = Intermediate; R = Resistant **           P = Positive; N = Negative        MICS are expressed in micrograms per mL   Antibiotic         RSLT#1  RSLT#2  RSLT#3  RSLT#4  Amikacin            S     S  Cefepime            S     S  Ceftazidime          S     S  Ciprofloxacin         S     S            Assessment    Improvement post port removal.    Plan    Reassessment in one week.    Patient to return in one week. PCP:  Vanetta Shawl 12/22/2014, 6:26 AM

## 2014-12-19 NOTE — Patient Instructions (Signed)
Patient to return in one week. 

## 2014-12-20 ENCOUNTER — Inpatient Hospital Stay: Payer: BLUE CROSS/BLUE SHIELD | Attending: Oncology

## 2014-12-20 ENCOUNTER — Inpatient Hospital Stay (HOSPITAL_BASED_OUTPATIENT_CLINIC_OR_DEPARTMENT_OTHER): Payer: BLUE CROSS/BLUE SHIELD | Admitting: Oncology

## 2014-12-20 ENCOUNTER — Inpatient Hospital Stay: Payer: BLUE CROSS/BLUE SHIELD

## 2014-12-20 ENCOUNTER — Encounter: Payer: Self-pay | Admitting: Oncology

## 2014-12-20 VITALS — BP 128/82 | HR 91 | Temp 97.1°F | Wt 197.0 lb

## 2014-12-20 DIAGNOSIS — T814XXS Infection following a procedure, sequela: Secondary | ICD-10-CM

## 2014-12-20 DIAGNOSIS — C50412 Malignant neoplasm of upper-outer quadrant of left female breast: Secondary | ICD-10-CM | POA: Diagnosis not present

## 2014-12-20 DIAGNOSIS — Z170 Estrogen receptor positive status [ER+]: Secondary | ICD-10-CM | POA: Insufficient documentation

## 2014-12-20 DIAGNOSIS — L03313 Cellulitis of chest wall: Secondary | ICD-10-CM

## 2014-12-20 DIAGNOSIS — B965 Pseudomonas (aeruginosa) (mallei) (pseudomallei) as the cause of diseases classified elsewhere: Secondary | ICD-10-CM | POA: Insufficient documentation

## 2014-12-20 DIAGNOSIS — Z79811 Long term (current) use of aromatase inhibitors: Secondary | ICD-10-CM | POA: Diagnosis not present

## 2014-12-20 DIAGNOSIS — Z85528 Personal history of other malignant neoplasm of kidney: Secondary | ICD-10-CM | POA: Diagnosis not present

## 2014-12-20 DIAGNOSIS — Z79899 Other long term (current) drug therapy: Secondary | ICD-10-CM | POA: Diagnosis not present

## 2014-12-20 DIAGNOSIS — Z5111 Encounter for antineoplastic chemotherapy: Secondary | ICD-10-CM | POA: Diagnosis not present

## 2014-12-20 DIAGNOSIS — Z9012 Acquired absence of left breast and nipple: Secondary | ICD-10-CM | POA: Insufficient documentation

## 2014-12-20 LAB — CBC WITH DIFFERENTIAL/PLATELET
BASOS ABS: 0 10*3/uL (ref 0–0.1)
BASOS PCT: 1 %
EOS ABS: 0.1 10*3/uL (ref 0–0.7)
Eosinophils Relative: 2 %
HCT: 43.4 % (ref 35.0–47.0)
HEMOGLOBIN: 14.8 g/dL (ref 12.0–16.0)
Lymphocytes Relative: 35 %
Lymphs Abs: 2.3 10*3/uL (ref 1.0–3.6)
MCH: 30.3 pg (ref 26.0–34.0)
MCHC: 34.1 g/dL (ref 32.0–36.0)
MCV: 88.9 fL (ref 80.0–100.0)
Monocytes Absolute: 0.5 10*3/uL (ref 0.2–0.9)
Monocytes Relative: 8 %
Neutro Abs: 3.5 10*3/uL (ref 1.4–6.5)
Neutrophils Relative %: 54 %
Platelets: 236 10*3/uL (ref 150–440)
RBC: 4.89 MIL/uL (ref 3.80–5.20)
RDW: 14 % (ref 11.5–14.5)
WBC: 6.5 10*3/uL (ref 3.6–11.0)

## 2014-12-20 LAB — COMPREHENSIVE METABOLIC PANEL
ALT: 41 U/L (ref 14–54)
AST: 35 U/L (ref 15–41)
Albumin: 4 g/dL (ref 3.5–5.0)
Alkaline Phosphatase: 83 U/L (ref 38–126)
Anion gap: 5 (ref 5–15)
BILIRUBIN TOTAL: 0.6 mg/dL (ref 0.3–1.2)
BUN: 14 mg/dL (ref 6–20)
CO2: 30 mmol/L (ref 22–32)
CREATININE: 0.67 mg/dL (ref 0.44–1.00)
Calcium: 9 mg/dL (ref 8.9–10.3)
Chloride: 101 mmol/L (ref 101–111)
GFR calc Af Amer: >60 mL/min (ref >60)
Glucose, Bld: 139 mg/dL — ABNORMAL HIGH (ref 65–99)
Potassium: 3.7 mmol/L (ref 3.5–5.1)
Sodium: 136 mmol/L (ref 135–145)
Total Protein: 7.3 g/dL (ref 6.5–8.1)

## 2014-12-20 MED ORDER — SODIUM CHLORIDE 0.9 % IV SOLN
6.0000 mg/kg | Freq: Once | INTRAVENOUS | Status: AC
  Administered 2014-12-20: 525 mg via INTRAVENOUS
  Filled 2014-12-20: qty 25

## 2014-12-20 MED ORDER — SODIUM CHLORIDE 0.9 % IJ SOLN
10.0000 mL | INTRAMUSCULAR | Status: DC | PRN
  Filled 2014-12-20: qty 10

## 2014-12-20 MED ORDER — DIPHENHYDRAMINE HCL 25 MG PO CAPS
50.0000 mg | ORAL_CAPSULE | Freq: Once | ORAL | Status: AC
  Administered 2014-12-20: 25 mg via ORAL
  Filled 2014-12-20: qty 2

## 2014-12-20 MED ORDER — SODIUM CHLORIDE 0.9 % IV SOLN
Freq: Once | INTRAVENOUS | Status: DC
Start: 2014-12-20 — End: 2014-12-20
  Filled 2014-12-20: qty 1000

## 2014-12-20 MED ORDER — ACETAMINOPHEN 325 MG PO TABS
650.0000 mg | ORAL_TABLET | Freq: Once | ORAL | Status: AC
Start: 2014-12-20 — End: 2014-12-20
  Administered 2014-12-20: 650 mg via ORAL
  Filled 2014-12-20: qty 2

## 2014-12-20 NOTE — Progress Notes (Signed)
[place] @ [facility] [phone]  Fax:([phone]     Danielle Wallace OB: 11-Dec-1960  MR#: [id number]  VZD#:[id number]  Patient Care Team: Lavera Guise, MD as PCP - General (Internal Medicine) Robert Bellow, MD (General Surgery) Dion Body, MD as Referring Physician (Family Medicine) Forest Gleason, MD (Oncology)  CHIEF COMPLAINT:  Chief Complaint  Patient presents with  . Follow-up    Oncology History   1.carcinoma of breast(left) T1 C. N0 M0 status post ultrasound-guided biopsy Estrogen receptor positive. Progesterone receptor positive.  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 chemotherapy on hold since November because of cellulitis in the chest wall. 4.started on Herceptin May 22, 2014.  Cause of recurrent chest wall  infection chemotherapy was put on hold. 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  Patient is on multiple antibiotic and as per infectious disease specialist similar to stay on antibiotic for 4 months. So chemotherapy has been discontinued. Maintenance Herceptin as well as patient was started on letrozole from June 12, 2014 6.  Port was removed because of Pseudomonas infection of the pocket (August, 2016) 7.  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   November 02, 2014 Patient is here for ongoing evaluation and treatment consideration.  Patient is feeling weak and tired has low-grade fever.  Patient was seen by primary care physician because of increasing shortness of breath stress nuclear medicine echocardiogram is being planned.  Patient is cough shortness of breath patient was started on prednisone.  No chest pain. November 08, 2014 Patient is here for ongoing evaluation and treatment consideration.  Cough and shortness of breath is improved.  Patient had a CT scan of the chest which did not reveal any evidence of pulmonary embolism or pulmonary infiltrate. Nuclear stress MUGA scan also was within acceptable range.  Patient has improved on prednisone which is being tapered off Here for further evaluation and continuation of chemotherapy with maintenance Herceptin  November 29, 2014 Patient is here for ongoing evaluation and continuation of Herceptin therapy. No chills or fever.  Appetite has been stable.  No shortness of breath.  Patient is off all prednisone therapy no cough.  Here for continuation of treatment  December 07, 2014 Patient came today as an acute abdominal complaining of left upper chest wall tenderness.  According to her tenderness started about the port site.  No chills.  No fever. December 20, 2014 Report has been removed because of pocket infection with Pseudomonas on Cipro at present time. No chills fever.  No nausea no vomiting here for continuation of Herceptin therapy today the last Herceptin treatment  REVIEW OF SYSTEMS:  Gen. status: General status is improved.  No chills or fever. HEENT:  No headache no dizziness Lungs: Shortness of breath has improved.  Cough is improved.  No fever.   Cardiac: No chest pain or paroxysmal nocturnal dyspnea GI: No nausea no vomiting no diarrhea GU: No dysuria  hematuria Musculoskeletal system no bony pain  Skin: No rash Neurological system: No tingling numbness Chest wall tenderness at the port site about port site.  Slight redness at the previous incision site Past Medical History  Diagnosis Date  . Asthma   . Collapsed lung 2007  . Diffuse cystic mastopathy   . Arthritis 2006  . Sinus problem   . Cancer 2006    Renal cell carcinoma; cryosurgery treatment  . Breast cancer 2015    chemo, herception  . Diabetes mellitus without complication 4034    Metformin    PAST SURGICAL HISTORY: Past Surgical History  Procedure Laterality Date  . Cesarean section  1991  . Cyst removal neck  10/2011    Dr. Tami Ribas  . Cholecystectomy  1990  . Cryoablation  2005, 2010  . Portacath placement  11/24/13  . Incision and drainage Left Dec 2015, Feb 2016    Dr Tula Nakayama  . Breast surgery Left 10/2011    Cyst Aspirationapocrine metaplasia, ductal cells and bone cells, hypo-cellular  . Breast surgery Left 2009    ADH on stereotactic biopsy, 1.5 mm focus.  . Breast surgery Left 2003    fibrocystic changes with ductal hyperplasia without atypia.  . Breast surgery Left     mastectomy  . Breast surgery Left April 11, 2014    Removal of implant, debridement chest wall Dr.Coan  . Breast biopsy Right 2005    negative  . Breast biopsy Left 2009    negative  . Breast biopsy Left 2015    positive  . Mastectomy Left 11/24/2013    positive    FAMILY HISTORY Family History  Problem Relation Age of Onset  . Breast cancer Paternal Aunt 52  . Ovarian cancer Maternal Grandmother   . Liver cancer Father   . Diabetes Mother   . Hypertension Mother          ADVANCED DIRECTIVES:  patient does have advance care directive    HEALTH MAINTENANCE: Social History  Substance Use Topics  . Smoking status: Never Smoker   . Smokeless tobacco: Never Used  . Alcohol Use: Yes     Comment: occasionally    :  Allergies  Allergen Reactions  . Linezolid Other  (See Comments)  . Taxotere [Docetaxel] Anaphylaxis  . Floxin [Ofloxacin] Other (See Comments)    systemic  . Hydrocodone Itching  . Sulfa Antibiotics Other (See Comments)    blisters  . Codeine Rash  . Penicillins Rash  . Vancomycin Rash    Current Outpatient Prescriptions  Medication Sig Dispense Refill  . acetaminophen (TYLENOL) 325 MG tablet Take 650 mg by mouth every 6 (six) hours as needed for mild pain.    . Calcium Carb-Cholecalciferol (CALCIUM + D3) 600-200 MG-UNIT TABS TAKE 1 TABLET BY MOUTH TWICE A DAY 60 tablet 5  . canagliflozin (INVOKANA) 300 MG TABS tablet TAKE 1 TABLET BY MOUTH EVERY DAY    . ciprofloxacin (CIPRO) 500 MG tablet Take 500 mg by mouth 2 (two) times daily.    . fluticasone (VERAMYST) 27.5 MCG/SPRAY nasal spray Place 2 sprays into the nose daily.    Marland Kitchen gabapentin (NEURONTIN) 400 MG capsule Take 1 capsule (400 mg total) by mouth 3 (three) times daily. 90 capsule 4  . imipramine (  TOFRANIL) 25 MG tablet Take 25 mg by mouth at bedtime.    Marland Kitchen letrozole (FEMARA) 2.5 MG tablet TAKE 1 TABLET BY MOUTH EVERY DAY 30 tablet 5  . levocetirizine (XYZAL) 5 MG tablet Take 5 mg by mouth every evening.    . meloxicam (MOBIC) 7.5 MG tablet Take 7.5 mg by mouth 2 (two) times daily.    . metFORMIN (GLUCOPHAGE) 500 MG tablet Take 500 mg by mouth QID.    Marland Kitchen Probiotic Product (PROBIOTIC DAILY PO) Take by mouth.    . sitaGLIPtin (JANUVIA) 50 MG tablet Take 50 mg by mouth daily.    . Trastuzumab (HERCEPTIN IV) Inject into the vein.    . valsartan-hydrochlorothiazide (DIOVAN-HCT) 80-12.5 MG per tablet Take 1 tablet by mouth daily.     No current facility-administered medications for this visit.    OBJECTIVE:  Filed Vitals:   12/20/14 1349  BP: 128/82  Pulse: 91  Temp: 97.1 F (36.2 C)     Body mass index is 32.78 kg/(m^2).    ECOG FS:1 - Symptomatic but completely ambulatory  PHYSICAL EXAM:general status: Patient is feeling stronger.  Fully active.  Not any acute  distress HEENT:.  No evidence of stomatitis   lExamination of the chest was unremarkable. There were no bony deformities, no asymmetry, and no other abnormalities.  Cardiac exam revealed the PMI to be normally situated and sized. The rhythm was regular and no extrasystoles were noted during several minutes of auscultation. The first and second heart sounds were normal and physiologic splitting of the second heart sound was noted. There were no murmurs, rubs, clicks, or gallops. Examination of breasts: Right breast free of masses.  left breast wound is healed well.  No redness. Abdomen: Soft liver and spleen not palpable no ascites Examination of the skin revealed no evidence of significant rashes, suspicious appearing nevi or other concerning lesions.. Lymphatic system: Supraclavicular, cervical, axillary, inguinal lymph nodes are not palpable Left chest wall area and no evidence of recurrent disease.  Wound is healed completely.  Right breast free of masses  Redness and tenderness in the chest wall area Neurological system: Higher functions within normal limit.  No other localizing sign All other systems have been examined   LAB RESULTS:  Appointment on 12/20/2014  Component Date Value Ref Range Status  . Sodium 12/20/2014 136  135 - 145 mmol/L Final  . Potassium 12/20/2014 3.7  3.5 - 5.1 mmol/L Final  . Chloride 12/20/2014 101  101 - 111 mmol/L Final  . CO2 12/20/2014 30  22 - 32 mmol/L Final  . Glucose, Bld 12/20/2014 139* 65 - 99 mg/dL Final  . BUN 12/20/2014 14  6 - 20 mg/dL Final  . Creatinine, Ser 12/20/2014 0.67  0.44 - 1.00 mg/dL Final  . Calcium 12/20/2014 9.0  8.9 - 10.3 mg/dL Final  . Total Protein 12/20/2014 7.3  6.5 - 8.1 g/dL Final  . Albumin 12/20/2014 4.0  3.5 - 5.0 g/dL Final  . AST 12/20/2014 35  15 - 41 U/L Final  . ALT 12/20/2014 41  14 - 54 U/L Final  . Alkaline Phosphatase 12/20/2014 83  38 - 126 U/L Final  . Total Bilirubin 12/20/2014 0.6  0.3 - 1.2 mg/dL Final   . GFR calc non Af Amer 12/20/2014 >60  >60 mL/min Final  . GFR calc Af Amer 12/20/2014 >60  >60 mL/min Final   Comment: (NOTE) The eGFR has been calculated using the CKD EPI equation. This calculation has not been  validated in all clinical situations. eGFR's persistently <60 mL/min signify possible Chronic Kidney Disease.   . Anion gap 12/20/2014 5  5 - 15 Final  . WBC 12/20/2014 6.5  3.6 - 11.0 K/uL Final  . RBC 12/20/2014 4.89  3.80 - 5.20 MIL/uL Final  . Hemoglobin 12/20/2014 14.8  12.0 - 16.0 g/dL Final  . HCT 12/20/2014 43.4  35.0 - 47.0 % Final  . MCV 12/20/2014 88.9  80.0 - 100.0 fL Final  . MCH 12/20/2014 30.3  26.0 - 34.0 pg Final  . MCHC 12/20/2014 34.1  32.0 - 36.0 g/dL Final  . RDW 12/20/2014 14.0  11.5 - 14.5 % Final  . Platelets 12/20/2014 236  150 - 440 K/uL Final  . Neutrophils Relative % 12/20/2014 54   Final  . Neutro Abs 12/20/2014 3.5  1.4 - 6.5 K/uL Final  . Lymphocytes Relative 12/20/2014 35   Final  . Lymphs Abs 12/20/2014 2.3  1.0 - 3.6 K/uL Final  . Monocytes Relative 12/20/2014 8   Final  . Monocytes Absolute 12/20/2014 0.5  0.2 - 0.9 K/uL Final  . Eosinophils Relative 12/20/2014 2   Final  . Eosinophils Absolute 12/20/2014 0.1  0 - 0.7 K/uL Final  . Basophils Relative 12/20/2014 1   Final  . Basophils Absolute 12/20/2014 0.0  0 - 0.1 K/uL Final    Lab Results  Component Value Date   LABCA2 29.7 10/14/2013   No results found for: CA199 Lab Results  Component Value Date   CEA 1.8 10/14/2013     STUDIES: No results found.  ASSESSMENT   Cancer of breast stage IC will finish up Herceptin treatment today. Port has been removed there was a Pseudomonas infection Ace and is on Cipro being monitored by Dr. Ola Spurr, infectious disease specialist  [place]iss:  Last Herceptin therapy would be the next treatment.  Patient will continue letrozole for total 5 years [place]  guidelines for follow-up in breast cancer patient History and  physical 1-4 times per year as clinically indicated for first 5 years Periodic screening for changes in the family history and referable to genetic counseling is necessary. Mammographic every 12 months Educated monitor and refer patient for lymphedema management if needed Routine imaging of reconstructed breasts is not indicated In absence of clinical signs and symptoms suggestive of recurrent disease there is no indication for lab or imaging studies for metastatic screening Recommend on tamoxifen needs annual GYN evaluation if uterus is present.  Patient on aromatase inhibitor needs bone density study for evaluation regarding osteoporosis Evidence suggests that active lifestyle healthy diet limited alcohol intake and achieving i and maintaining ideal body weight may lead to optimal breast cancer outcome Breast cancer of upper-outer quadrant of left female breast   Staging form: Breast, AJCC 7th Edition     Clinical: Stage IA (T1c, N0, M0) - Marni Griffon, MD   12/20/2014 2:08 PM

## 2014-12-20 NOTE — Progress Notes (Signed)
Patient does have living will.  Never smoked.

## 2014-12-26 ENCOUNTER — Ambulatory Visit: Payer: BLUE CROSS/BLUE SHIELD | Admitting: General Surgery

## 2014-12-26 ENCOUNTER — Encounter: Payer: Self-pay | Admitting: General Surgery

## 2014-12-26 VITALS — BP 110/60 | HR 88 | Resp 14 | Ht 65.0 in | Wt 198.0 lb

## 2014-12-26 DIAGNOSIS — T80219D Unspecified infection due to central venous catheter, subsequent encounter: Secondary | ICD-10-CM

## 2014-12-26 NOTE — Patient Instructions (Signed)
Follow up as scheduled in June 2017. The patient is aware to call back for any questions or concerns.

## 2014-12-26 NOTE — Progress Notes (Signed)
Patient ID: Danielle Wallace, female   DOB: [birth year], 54 y.o.   MRN: [id number]  Chief Complaint  Patient presents with  . Routine Post Op    port removal    HPI Danielle Wallace is a 54 y.o. female here for follow up from port removal and port site infection. She reports that the area is doing much better. She takes her last dose of antibiotics tomorrow.   She thinks she had her colonoscopy within the past 2 -3 years.   HPI  Past Medical History  Diagnosis Date  . Asthma   . Collapsed lung 2007  . Diffuse cystic mastopathy   . Arthritis 2006  . Sinus problem   . Cancer 2006    Renal cell carcinoma; cryosurgery treatment  . Breast cancer 2015    chemo, herception  . Diabetes mellitus without complication 8185    Metformin    Past Surgical History  Procedure Laterality Date  . Cesarean section  1991  . Cyst removal neck  10/2011    Dr. Tami Ribas  . Cholecystectomy  1990  . Cryoablation  2005, 2010  . Portacath placement  11/24/13  . Incision and drainage Left Dec 2015, Feb 2016    Dr Tula Nakayama  . Breast surgery Left 10/2011    Cyst Aspirationapocrine metaplasia, ductal cells and bone cells, hypo-cellular  . Breast surgery Left 2009    ADH on stereotactic biopsy, 1.5 mm focus.  . Breast surgery Left 2003    fibrocystic changes with ductal hyperplasia without atypia.  . Breast surgery Left     mastectomy  . Breast surgery Left April 11, 2014    Removal of implant, debridement chest wall Dr.Coan  . Breast biopsy Right 2005    negative  . Breast biopsy Left 2009    negative  . Breast biopsy Left 2015    positive  . Mastectomy Left 11/24/2013    positive  . Colonoscopy  08/26/2013    Verdie Shire, M.D. normal.    Family History  Problem Relation Age of Onset  . Breast cancer Paternal Aunt 33  . Ovarian cancer Maternal Grandmother   . Liver cancer Father   . Diabetes Mother   . Hypertension Mother     Social History Social History  Substance Use Topics  . Smoking  status: Never Smoker   . Smokeless tobacco: Never Used  . Alcohol Use: Yes     Comment: occasionally    Allergies  Allergen Reactions  . Linezolid Other (See Comments)  . Taxotere [Docetaxel] Anaphylaxis  . Floxin [Ofloxacin] Other (See Comments)    systemic  . Hydrocodone Itching  . Sulfa Antibiotics Other (See Comments)    blisters  . Codeine Rash  . Penicillins Rash  . Vancomycin Rash    Current Outpatient Prescriptions  Medication Sig Dispense Refill  . acetaminophen (TYLENOL) 325 MG tablet Take 650 mg by mouth every 6 (six) hours as needed for mild pain.    . Calcium Carb-Cholecalciferol (CALCIUM + D3) 600-200 MG-UNIT TABS TAKE 1 TABLET BY MOUTH TWICE A DAY 60 tablet 5  . canagliflozin (INVOKANA) 300 MG TABS tablet TAKE 1 TABLET BY MOUTH EVERY DAY    . ciprofloxacin (CIPRO) 500 MG tablet Take 500 mg by mouth 2 (two) times daily.    . fluticasone (VERAMYST) 27.5 MCG/SPRAY nasal spray Place 2 sprays into the nose daily.    Marland Kitchen gabapentin (NEURONTIN) 400 MG capsule Take 1 capsule (400 mg total) by mouth 3 (three)  times daily. 90 capsule 4  . imipramine (TOFRANIL) 25 MG tablet Take 25 mg by mouth at bedtime.    Marland Kitchen letrozole (FEMARA) 2.5 MG tablet TAKE 1 TABLET BY MOUTH EVERY DAY 30 tablet 5  . levocetirizine (XYZAL) 5 MG tablet Take 5 mg by mouth every evening.    . meloxicam (MOBIC) 7.5 MG tablet Take 7.5 mg by mouth 2 (two) times daily.    . metFORMIN (GLUCOPHAGE) 500 MG tablet Take 500 mg by mouth QID.    Marland Kitchen Probiotic Product (PROBIOTIC DAILY PO) Take by mouth.    . sitaGLIPtin (JANUVIA) 50 MG tablet Take 50 mg by mouth daily.    . Trastuzumab (HERCEPTIN IV) Inject into the vein.    . valsartan-hydrochlorothiazide (DIOVAN-HCT) 80-12.5 MG per tablet Take 1 tablet by mouth daily.     No current facility-administered medications for this visit.    Review of Systems Review of Systems  Constitutional: Negative.   Respiratory: Negative.   Cardiovascular: Negative.     Blood  pressure 110/60, pulse 88, resp. rate 14, height 5' 5"  (1.651 m), weight 198 lb (89.812 kg).  Physical Exam Physical Exam  Constitutional: She is oriented to person, place, and time. She appears well-developed and well-nourished.  HENT:  Mouth/Throat: Oropharynx is clear and moist.  Pulmonary/Chest:  Right chest incision well healed.  Neurological: She is alert and oriented to person, place, and time.  Skin: Skin is warm and dry.  Psychiatric: Her behavior is normal.    Data Reviewed April 2015 colonoscopy report was unremarkable. Five-year follow-up recommended.  Assessment    Doing well status post explantation of right subclavian PowerPort.    Plan   Follow up as scheduled in June 2017 with a right breast mammogram. (Screening). .  PCP: Clayborn Bigness    Robert Bellow 12/26/2014, 9:55 PM

## 2015-01-05 ENCOUNTER — Other Ambulatory Visit: Payer: Self-pay | Admitting: General Surgery

## 2015-03-08 ENCOUNTER — Ambulatory Visit
Admission: RE | Admit: 2015-03-08 | Discharge: 2015-03-08 | Disposition: A | Discharge status: Home / Self Care | Payer: BLUE CROSS/BLUE SHIELD | Source: Ambulatory Visit | Attending: Nurse Practitioner | Admitting: Nurse Practitioner

## 2015-03-08 ENCOUNTER — Other Ambulatory Visit: Payer: Self-pay | Admitting: Nurse Practitioner

## 2015-03-08 DIAGNOSIS — M25532 Pain in left wrist: Secondary | ICD-10-CM | POA: Insufficient documentation

## 2015-03-22 ENCOUNTER — Inpatient Hospital Stay: Payer: BLUE CROSS/BLUE SHIELD | Attending: Oncology | Admitting: Oncology

## 2015-03-22 ENCOUNTER — Inpatient Hospital Stay: Payer: BLUE CROSS/BLUE SHIELD

## 2015-03-22 ENCOUNTER — Encounter: Payer: Self-pay | Admitting: Oncology

## 2015-03-22 VITALS — BP 122/79 | HR 96 | Temp 97.0°F | Wt 201.5 lb

## 2015-03-22 DIAGNOSIS — C50412 Malignant neoplasm of upper-outer quadrant of left female breast: Secondary | ICD-10-CM

## 2015-03-22 DIAGNOSIS — Z7984 Long term (current) use of oral hypoglycemic drugs: Secondary | ICD-10-CM

## 2015-03-22 DIAGNOSIS — Z170 Estrogen receptor positive status [ER+]: Secondary | ICD-10-CM | POA: Diagnosis not present

## 2015-03-22 DIAGNOSIS — Z79899 Other long term (current) drug therapy: Secondary | ICD-10-CM | POA: Insufficient documentation

## 2015-03-22 DIAGNOSIS — Z9012 Acquired absence of left breast and nipple: Secondary | ICD-10-CM | POA: Diagnosis not present

## 2015-03-22 DIAGNOSIS — C50919 Malignant neoplasm of unspecified site of unspecified female breast: Secondary | ICD-10-CM | POA: Insufficient documentation

## 2015-03-22 DIAGNOSIS — Z9221 Personal history of antineoplastic chemotherapy: Secondary | ICD-10-CM | POA: Insufficient documentation

## 2015-03-22 DIAGNOSIS — E119 Type 2 diabetes mellitus without complications: Secondary | ICD-10-CM | POA: Diagnosis not present

## 2015-03-22 DIAGNOSIS — Z85528 Personal history of other malignant neoplasm of kidney: Secondary | ICD-10-CM | POA: Diagnosis not present

## 2015-03-22 DIAGNOSIS — L03313 Cellulitis of chest wall: Secondary | ICD-10-CM

## 2015-03-22 DIAGNOSIS — Z79811 Long term (current) use of aromatase inhibitors: Secondary | ICD-10-CM | POA: Diagnosis not present

## 2015-03-22 LAB — COMPREHENSIVE METABOLIC PANEL
ALT: 39 U/L (ref 14–54)
ANION GAP: 8 (ref 5–15)
AST: 38 U/L (ref 15–41)
Albumin: 3.9 g/dL (ref 3.5–5.0)
Alkaline Phosphatase: 78 U/L (ref 38–126)
BUN: 19 mg/dL (ref 6–20)
CALCIUM: 9.3 mg/dL (ref 8.9–10.3)
CO2: 32 mmol/L (ref 22–32)
CREATININE: 0.79 mg/dL (ref 0.44–1.00)
Chloride: 98 mmol/L — ABNORMAL LOW (ref 101–111)
GFR calc non Af Amer: >60 mL/min (ref >60)
GLUCOSE: 131 mg/dL — AB (ref 65–99)
Potassium: 3.9 mmol/L (ref 3.5–5.1)
SODIUM: 138 mmol/L (ref 135–145)
TOTAL PROTEIN: 6.9 g/dL (ref 6.5–8.1)
Total Bilirubin: 0.8 mg/dL (ref 0.3–1.2)

## 2015-03-22 LAB — CBC WITH DIFFERENTIAL/PLATELET
Basophils Absolute: 0 10*3/uL (ref 0–0.1)
Basophils Relative: 1 %
EOS ABS: 0.2 10*3/uL (ref 0–0.7)
EOS PCT: 4 %
HCT: 42.7 % (ref 35.0–47.0)
Hemoglobin: 14.2 g/dL (ref 12.0–16.0)
LYMPHS ABS: 1.9 10*3/uL (ref 1.0–3.6)
Lymphocytes Relative: 34 %
MCH: 29.9 pg (ref 26.0–34.0)
MCHC: 33.3 g/dL (ref 32.0–36.0)
MCV: 89.8 fL (ref 80.0–100.0)
Monocytes Absolute: 0.5 10*3/uL (ref 0.2–0.9)
Monocytes Relative: 8 %
Neutro Abs: 3 10*3/uL (ref 1.4–6.5)
Neutrophils Relative %: 53 %
PLATELETS: 216 10*3/uL (ref 150–440)
RBC: 4.75 MIL/uL (ref 3.80–5.20)
RDW: 13.7 % (ref 11.5–14.5)
WBC: 5.6 10*3/uL (ref 3.6–11.0)

## 2015-03-22 NOTE — Progress Notes (Signed)
Danielle @ [facility] [phone]  [phone]     Danielle Wallace OB: December 26, 1960  MR#: [id number]  MCN#:[id number]  Patient Care Team: Lavera Guise, MD as PCP - General (Internal Medicine) Robert Bellow, MD (General Surgery) Dion Body, MD as Referring Physician (Family Medicine) Forest Gleason, MD (Oncology)  CHIEF COMPLAINT:  Chief Complaint  Patient presents with  . OTHER    Oncology History   1.carcinoma of breast(left) T1 C. N0 M0 status post ultrasound-guided biopsy Estrogen receptor positive. Progesterone receptor positive.  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 chemotherapy on hold since November because of cellulitis in the chest wall. 4.started on Herceptin May 22, 2014.  Cause of recurrent chest wall  infection chemotherapy was put on hold. 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  Patient is on multiple antibiotic and as per infectious disease specialist similar to stay on antibiotic for 4 months. So chemotherapy has been discontinued. Maintenance Herceptin as well as patient was started on letrozole from June 12, 2014 6.  Port was removed because of Pseudomonas infection of the pocket (August, 2016) 7.  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    He should not is here for further follow-up regarding carcinoma breast.  Patient has been off antibiotics.  Taking letrozole had a mammogram done and reported to be negative was seen by Dr. Bary Castilla. Patient complains of joint pain mainly in both hands.  And some other joints.  Here for further follow-up and treatment consideration   REVIEW OF SYSTEMS:  Gen. status: General status is improved.  No chills or fever. HEENT: No headache no dizziness Lungs: Shortness of breath has improved.  Cough is improved.  No fever.   Cardiac: No chest pain or paroxysmal nocturnal dyspnea GI: No nausea no vomiting no diarrhea GU: No dysuria hematuria Musculoskeletal system no bony pain  Skin: No rash Neurological system: No tingling numbness Chest wall tenderness at the port site about port site.  Slight redness at the previous incision site Past Medical History  Diagnosis Date  . Asthma   . Collapsed lung 2007  . Diffuse cystic mastopathy   . Arthritis 2006  . Sinus problem   . Cancer [facility]) 2006    Renal cell carcinoma; cryosurgery treatment  . Breast cancer ([place]) 2015    chemo, herception  . Diabetes mellitus without complication ([place]) 6294    Metformin    PAST SURGICAL HISTORY: Past Surgical History  Procedure Laterality Date  . Cesarean section  1991  . Cyst removal neck  10/2011    Dr. Tami Ribas  . Cholecystectomy  1990  . Cryoablation  2005, 2010  . Portacath placement  11/24/13  . Incision and drainage Left Dec 2015, Feb 2016    Dr Tula Nakayama  . Breast surgery Left 10/2011    Cyst Aspirationapocrine metaplasia, ductal cells and bone cells, hypo-cellular  . Breast surgery Left 2009    ADH on stereotactic biopsy, 1.5 mm focus.  . Breast surgery Left 2003    fibrocystic changes with ductal hyperplasia without atypia.  . Breast surgery Left      mastectomy  . Breast surgery Left April 11, 2014    Removal of implant, debridement chest wall Dr.Coan  . Breast biopsy Right 2005    negative  . Breast biopsy Left 2009    negative  . Breast biopsy Left 2015    positive  . Mastectomy Left 11/24/2013    positive  . Colonoscopy  08/26/2013    Verdie Shire, M.D. normal.    FAMILY HISTORY Family History  Problem Relation Age of Onset  . Breast cancer Paternal Aunt 31  . Ovarian cancer Maternal Grandmother   . Liver cancer Father   . Diabetes Mother   . Hypertension Mother          ADVANCED DIRECTIVES:  patient does have advance care directive    HEALTH MAINTENANCE: Social History  Substance Use Topics  . Smoking status: Never Smoker   . Smokeless tobacco: Never Used  . Alcohol Use: Yes     Comment: occasionally    :  Allergies  Allergen Reactions  . Linezolid Other (See Comments)  . Taxotere [Docetaxel] Anaphylaxis  . Floxin [Ofloxacin] Other (See Comments)    systemic  . Hydrocodone Itching  . Sulfa Antibiotics Other (See Comments)    blisters  . Codeine Rash  . Penicillins Rash  . Vancomycin Rash    Current Outpatient Prescriptions  Medication Sig Dispense Refill  . acetaminophen (TYLENOL) 325 MG tablet Take 650 mg by mouth every 6 (six) hours as needed for mild pain.    . Calcium Carb-Cholecalciferol (CALCIUM + D3) 600-200 MG-UNIT TABS TAKE 1 TABLET BY MOUTH TWICE A DAY 60 tablet 5  . canagliflozin (INVOKANA) 300 MG TABS tablet TAKE 1 TABLET BY MOUTH EVERY DAY    . doxepin (SINEQUAN) 10 MG capsule TAKE ONE CAPSULE BY MOUTH EVERY EVENING FOR INSOMNIA    . etodolac (LODINE) 400 MG tablet TAKE 1 TABLET BY MOUTH 3 TIMES A DAY AS NEEDED FOR PAIN    . gabapentin (NEURONTIN) 600 MG tablet Take 600 mg by mouth 3 (three) times daily.  2  . imipramine (TOFRANIL) 25 MG tablet Take 25 mg by mouth at bedtime.    Marland Kitchen letrozole (FEMARA) 2.5 MG tablet TAKE 1 TABLET BY MOUTH EVERY DAY 30 tablet 5  . levocetirizine (XYZAL) 5  MG tablet Take 5 mg by mouth every evening.    . metFORMIN (GLUCOPHAGE) 500 MG tablet Take 500 mg by mouth QID.    Marland Kitchen Probiotic Product (PROBIOTIC DAILY PO) Take by mouth.    . sitaGLIPtin (JANUVIA) 50 MG tablet Take 50 mg by mouth daily.    . valsartan-hydrochlorothiazide (DIOVAN-HCT) 80-12.5 MG per tablet Take 1 tablet by mouth daily.     No current facility-administered medications for this visit.    OBJECTIVE:  Filed Vitals:   03/22/15 1439  BP: 122/79  Pulse: 96  Temp: 97 F (36.1 C)     Body mass index is 33.53 kg/(m^2).    ECOG FS:1 - Symptomatic but completely ambulatory  PHYSICAL EXAM:general status: Patient is feeling stronger.  Fully active.  Not any acute distress HEENT:.  No evidence of stomatitis   lExamination of the chest was unremarkable. There were no bony deformities, no asymmetry, and no other abnormalities.  Cardiac exam revealed the PMI to be normally situated and sized. The rhythm was regular and no extrasystoles were noted during several minutes of auscultation. The first and second heart sounds were normal and physiologic splitting of the second heart sound was noted. There were no murmurs, rubs, clicks, or gallops. Examination of breasts: Right breast free of masses.  left breast wound is healed well.  No redness. Abdomen: Soft liver and spleen not palpable no ascites Examination of the skin revealed no evidence of significant rashes, suspicious appearing nevi or other concerning lesions.. Lymphatic system: Supraclavicular, cervical, axillary, inguinal lymph nodes are not palpable Left chest wall area no evidence of redness or recurrent disease right breast free of masses.  Joint pains may be secondary to letrozole and patient was instructed to stop for a few weeks and report to [place]  LAB RESULTS:  Appointment on 03/22/2015  Component Date Value Ref Range Status  . WBC 03/22/2015 5.6  3.6 - 11.0 K/uL Final  . RBC 03/22/2015 4.75  3.80 - 5.20 MIL/uL Final  .  Hemoglobin 03/22/2015 14.2  12.0 - 16.0 g/dL Final  . HCT 03/22/2015 42.7  35.0 - 47.0 % Final  . MCV 03/22/2015 89.8  80.0 - 100.0 fL Final  . MCH 03/22/2015 29.9  26.0 - 34.0 pg Final  . MCHC 03/22/2015 33.3  32.0 - 36.0 g/dL Final  . RDW 03/22/2015 13.7  11.5 - 14.5 % Final  . Platelets 03/22/2015 216  150 - 440 K/uL Final  . Neutrophils Relative % 03/22/2015 53   Final  . Neutro Abs 03/22/2015 3.0  1.4 - 6.5 K/uL Final  . Lymphocytes Relative 03/22/2015 34   Final  . Lymphs Abs 03/22/2015 1.9  1.0 - 3.6 K/uL Final  . Monocytes Relative 03/22/2015 8   Final  . Monocytes Absolute 03/22/2015 0.5  0.2 - 0.9 K/uL Final  . Eosinophils Relative 03/22/2015 4   Final  . Eosinophils Absolute 03/22/2015 0.2  0 - 0.7 K/uL Final  . Basophils Relative 03/22/2015 1   Final  . Basophils Absolute 03/22/2015 0.0  0 - 0.1 K/uL Final  . Sodium 03/22/2015 138  135 - 145 mmol/L Final  . Potassium 03/22/2015 3.9  3.5 - 5.1 mmol/L Final  . Chloride 03/22/2015 98* 101 - 111 mmol/L Final  . CO2 03/22/2015 32  22 - 32 mmol/L Final  . Glucose, Bld 03/22/2015 131* 65 - 99 mg/dL Final  . BUN 03/22/2015 19  6 - 20 mg/dL Final  . Creatinine, Ser 03/22/2015 0.79  0.44 - 1.00 mg/dL Final  . Calcium 03/22/2015 9.3  8.9 - 10.3 mg/dL Final  . Total Protein 03/22/2015 6.9  6.5 - 8.1 g/dL Final  . Albumin 03/22/2015 3.9  3.5 - 5.0 g/dL Final  . AST 03/22/2015 38  15 - 41 U/L Final  . ALT 03/22/2015 39  14 - 54 U/L Final  . Alkaline Phosphatase 03/22/2015 78  38 - 126 U/L Final  . Total Bilirubin 03/22/2015 0.8  0.3 - 1.2 mg/dL Final  . GFR calc non Af Amer 03/22/2015 >60  >60 mL/min Final  . GFR calc Af Amer 03/22/2015 >60  >60 mL/min Final   Comment: (NOTE) The eGFR has been calculated using the CKD EPI equation. This calculation has not been validated in all clinical situations.  eGFR's persistently <60 mL/min signify possible Chronic Kidney Disease.   . Anion gap 03/22/2015 8  5 - 15 Final    Lab Results    Component Value Date   LABCA2 29.7 10/14/2013   No results found for: CA199 Lab Results  Component Value Date   CEA 1.8 10/14/2013     STUDIES: Dg Wrist Complete Left  19-Mar-2015  CLINICAL DATA:  Left wrist pain centered over the radial styloid EXAM: LEFT WRIST - COMPLETE 3+ VIEW COMPARISON:  None in PACs FINDINGS: The bones of the wrist are adequately mineralized. There is a tiny bony density adjacent to the base of the radial styloid. This could reflect a tiny avulsion. The radiocarpal and ulnocarpal joints are unremarkable. The intercarpal joints are normal. 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 The other carpometacarpal joints appear normal. The carpal bones and metacarpals appear intact. The soft tissues exhibit no significant swelling. IMPRESSION: There is an approximately 1 mm bony density adjacent to the base of the radial styloid. In the absence of trauma this is likely due to arthritic change. 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 There is no acute bony abnormality of the left wrist. Electronically Signed   By: David  Martinique M.D.   On: 03/19/2015 16:03    ASSESSMENT   Cancer of breast stage IC On clinical ground there is no evidence of recurrent disease. Patient has joint pain which very well could be secondary to letrozole.  X-rays of the wrist has been reviewed. She was advised to stop letrozole for 3 weeks if pain is better  then   switched over to Aromasin A bone density study has been planned. Evaluate patient in 6 month or before if patient continues to have problem      Clinical: Stage IA (T1c, N0, M0) - Marni Griffon, MD   03/22/2015 2:48 PM

## 2015-04-23 ENCOUNTER — Telehealth: Payer: Self-pay | Admitting: *Deleted

## 2015-04-23 MED ORDER — EXEMESTANE 25 MG PO TABS: 25.0000 mg | ORAL_TABLET | Freq: Every day | ORAL | Status: DC

## 2015-04-23 MED ORDER — CALCIUM + D3 600-200 MG-UNIT PO TABS: 1.0000 | ORAL_TABLET | Freq: Two times a day (BID) | ORAL | Status: DC

## 2015-04-23 NOTE — Telephone Encounter (Signed)
Per Dr Oliva Bustard Aromasin 25 mg daily and Ca+ + Vit D. Pt informed of med being e scribed

## 2015-04-23 NOTE — Telephone Encounter (Signed)
Called to report that her joint aches have improved being off the Letrozole, so she requests to be put on something different. Also asking if she needs to continue taking Vitamin C and D and if so needs that called in as well

## 2015-05-09 ENCOUNTER — Other Ambulatory Visit: Payer: Self-pay | Admitting: Nurse Practitioner

## 2015-05-31 ENCOUNTER — Other Ambulatory Visit: Payer: Self-pay | Admitting: Family Medicine

## 2015-05-31 DIAGNOSIS — R1010 Upper abdominal pain, unspecified: Secondary | ICD-10-CM

## 2015-05-31 DIAGNOSIS — R110 Nausea: Secondary | ICD-10-CM

## 2015-06-05 ENCOUNTER — Ambulatory Visit
Admission: RE | Admit: 2015-06-05 | Discharge: 2015-06-05 | Disposition: A | Discharge status: Home / Self Care | Payer: BLUE CROSS/BLUE SHIELD | Source: Ambulatory Visit | Attending: Family Medicine | Admitting: Family Medicine

## 2015-06-05 DIAGNOSIS — R935 Abnormal findings on diagnostic imaging of other abdominal regions, including retroperitoneum: Secondary | ICD-10-CM | POA: Diagnosis not present

## 2015-06-05 DIAGNOSIS — R1010 Upper abdominal pain, unspecified: Secondary | ICD-10-CM | POA: Insufficient documentation

## 2015-06-05 DIAGNOSIS — Z9049 Acquired absence of other specified parts of digestive tract: Secondary | ICD-10-CM | POA: Insufficient documentation

## 2015-06-05 DIAGNOSIS — R110 Nausea: Secondary | ICD-10-CM | POA: Diagnosis not present

## 2015-06-05 DIAGNOSIS — K760 Fatty (change of) liver, not elsewhere classified: Secondary | ICD-10-CM | POA: Insufficient documentation

## 2015-06-07 ENCOUNTER — Other Ambulatory Visit: Payer: Self-pay | Admitting: Nurse Practitioner

## 2015-06-07 DIAGNOSIS — R55 Syncope and collapse: Secondary | ICD-10-CM

## 2015-06-07 DIAGNOSIS — R10817 Generalized abdominal tenderness: Secondary | ICD-10-CM

## 2015-06-11 ENCOUNTER — Encounter: Payer: Self-pay | Admitting: Oncology

## 2015-06-11 DIAGNOSIS — C50412 Malignant neoplasm of upper-outer quadrant of left female breast: Secondary | ICD-10-CM

## 2015-06-15 ENCOUNTER — Other Ambulatory Visit: Payer: BLUE CROSS/BLUE SHIELD

## 2015-06-15 ENCOUNTER — Ambulatory Visit: Payer: BLUE CROSS/BLUE SHIELD

## 2015-06-18 ENCOUNTER — Other Ambulatory Visit: Payer: Self-pay | Admitting: Nurse Practitioner

## 2015-06-18 ENCOUNTER — Ambulatory Visit
Admission: RE | Admit: 2015-06-18 | Discharge: 2015-06-18 | Disposition: A | Discharge status: Home / Self Care | Payer: BLUE CROSS/BLUE SHIELD | Source: Ambulatory Visit | Attending: Nurse Practitioner | Admitting: Nurse Practitioner

## 2015-06-18 DIAGNOSIS — R10817 Generalized abdominal tenderness: Secondary | ICD-10-CM

## 2015-06-18 DIAGNOSIS — Z8553 Personal history of malignant neoplasm of renal pelvis: Secondary | ICD-10-CM | POA: Insufficient documentation

## 2015-06-18 DIAGNOSIS — R1084 Generalized abdominal pain: Secondary | ICD-10-CM | POA: Insufficient documentation

## 2015-06-18 DIAGNOSIS — I700 Atherosclerosis of aorta: Secondary | ICD-10-CM | POA: Diagnosis not present

## 2015-06-18 LAB — POCT I-STAT CREATININE: Creatinine, Ser: 0.6 mg/dL (ref 0.44–1.00)

## 2015-06-18 MED ORDER — IOHEXOL 300 MG/ML  SOLN
85.0000 mL | Freq: Once | INTRAMUSCULAR | Status: AC | PRN
  Administered 2015-06-18: 85 mL via INTRAVENOUS

## 2015-06-19 ENCOUNTER — Ambulatory Visit
Admission: RE | Admit: 2015-06-19 | Discharge: 2015-06-19 | Disposition: A | Discharge status: Home / Self Care | Payer: BLUE CROSS/BLUE SHIELD | Source: Ambulatory Visit | Attending: Oncology | Admitting: Oncology

## 2015-06-19 DIAGNOSIS — C50412 Malignant neoplasm of upper-outer quadrant of left female breast: Secondary | ICD-10-CM

## 2015-06-20 ENCOUNTER — Encounter: Payer: Self-pay | Admitting: Oncology

## 2015-06-20 ENCOUNTER — Inpatient Hospital Stay: Payer: BLUE CROSS/BLUE SHIELD | Attending: Oncology

## 2015-06-20 ENCOUNTER — Inpatient Hospital Stay (HOSPITAL_BASED_OUTPATIENT_CLINIC_OR_DEPARTMENT_OTHER): Payer: BLUE CROSS/BLUE SHIELD | Admitting: Oncology

## 2015-06-20 VITALS — BP 126/84 | HR 98 | Temp 96.7°F | Resp 18 | Wt 201.1 lb

## 2015-06-20 DIAGNOSIS — Z170 Estrogen receptor positive status [ER+]: Secondary | ICD-10-CM | POA: Insufficient documentation

## 2015-06-20 DIAGNOSIS — Z8614 Personal history of Methicillin resistant Staphylococcus aureus infection: Secondary | ICD-10-CM | POA: Insufficient documentation

## 2015-06-20 DIAGNOSIS — E119 Type 2 diabetes mellitus without complications: Secondary | ICD-10-CM | POA: Insufficient documentation

## 2015-06-20 DIAGNOSIS — R509 Fever, unspecified: Secondary | ICD-10-CM | POA: Diagnosis not present

## 2015-06-20 DIAGNOSIS — Z7984 Long term (current) use of oral hypoglycemic drugs: Secondary | ICD-10-CM | POA: Insufficient documentation

## 2015-06-20 DIAGNOSIS — Z9012 Acquired absence of left breast and nipple: Secondary | ICD-10-CM | POA: Insufficient documentation

## 2015-06-20 DIAGNOSIS — R109 Unspecified abdominal pain: Secondary | ICD-10-CM

## 2015-06-20 DIAGNOSIS — Z9221 Personal history of antineoplastic chemotherapy: Secondary | ICD-10-CM

## 2015-06-20 DIAGNOSIS — C50412 Malignant neoplasm of upper-outer quadrant of left female breast: Secondary | ICD-10-CM

## 2015-06-20 DIAGNOSIS — Z9882 Breast implant status: Secondary | ICD-10-CM | POA: Insufficient documentation

## 2015-06-20 DIAGNOSIS — R5383 Other fatigue: Secondary | ICD-10-CM | POA: Insufficient documentation

## 2015-06-20 DIAGNOSIS — R200 Anesthesia of skin: Secondary | ICD-10-CM | POA: Insufficient documentation

## 2015-06-20 DIAGNOSIS — Z79811 Long term (current) use of aromatase inhibitors: Secondary | ICD-10-CM | POA: Diagnosis not present

## 2015-06-20 DIAGNOSIS — R531 Weakness: Secondary | ICD-10-CM | POA: Diagnosis not present

## 2015-06-20 DIAGNOSIS — Z8041 Family history of malignant neoplasm of ovary: Secondary | ICD-10-CM | POA: Diagnosis not present

## 2015-06-20 DIAGNOSIS — M25541 Pain in joints of right hand: Secondary | ICD-10-CM | POA: Diagnosis not present

## 2015-06-20 DIAGNOSIS — R51 Headache: Secondary | ICD-10-CM | POA: Diagnosis not present

## 2015-06-20 DIAGNOSIS — M25542 Pain in joints of left hand: Secondary | ICD-10-CM

## 2015-06-20 DIAGNOSIS — K5900 Constipation, unspecified: Secondary | ICD-10-CM | POA: Insufficient documentation

## 2015-06-20 LAB — COMPREHENSIVE METABOLIC PANEL
ALT: 56 U/L — ABNORMAL HIGH (ref 14–54)
ANION GAP: 10 (ref 5–15)
AST: 41 U/L (ref 15–41)
Albumin: 4.2 g/dL (ref 3.5–5.0)
Alkaline Phosphatase: 107 U/L (ref 38–126)
BUN: 23 mg/dL — ABNORMAL HIGH (ref 6–20)
CHLORIDE: 100 mmol/L — AB (ref 101–111)
CO2: 24 mmol/L (ref 22–32)
Calcium: 9.4 mg/dL (ref 8.9–10.3)
Creatinine, Ser: 0.56 mg/dL (ref 0.44–1.00)
Glucose, Bld: 149 mg/dL — ABNORMAL HIGH (ref 65–99)
Potassium: 3.8 mmol/L (ref 3.5–5.1)
SODIUM: 134 mmol/L — AB (ref 135–145)
Total Bilirubin: 0.7 mg/dL (ref 0.3–1.2)
Total Protein: 7.6 g/dL (ref 6.5–8.1)

## 2015-06-20 LAB — CBC WITH DIFFERENTIAL/PLATELET
Basophils Absolute: 0 10*3/uL (ref 0–0.1)
Basophils Relative: 0 %
EOS ABS: 0.1 10*3/uL (ref 0–0.7)
EOS PCT: 2 %
HCT: 47.5 % — ABNORMAL HIGH (ref 35.0–47.0)
Hemoglobin: 16.4 g/dL — ABNORMAL HIGH (ref 12.0–16.0)
LYMPHS ABS: 2.2 10*3/uL (ref 1.0–3.6)
Lymphocytes Relative: 31 %
MCH: 30.9 pg (ref 26.0–34.0)
MCHC: 34.4 g/dL (ref 32.0–36.0)
MCV: 89.7 fL (ref 80.0–100.0)
MONO ABS: 0.4 10*3/uL (ref 0.2–0.9)
MONOS PCT: 6 %
Neutro Abs: 4.3 10*3/uL (ref 1.4–6.5)
Neutrophils Relative %: 61 %
PLATELETS: 216 10*3/uL (ref 150–440)
RBC: 5.3 MIL/uL — ABNORMAL HIGH (ref 3.80–5.20)
RDW: 12.6 % (ref 11.5–14.5)
WBC: 7.1 10*3/uL (ref 3.6–11.0)

## 2015-06-20 NOTE — Progress Notes (Signed)
Patient here today for CT results.  State she is also having pain in right flank area that radiates into right upper quadrant.  States when the pain hits she gets nauseated.  States she had a reaction to her Aromosin.  Patient brought list of symptoms with her today.

## 2015-06-20 NOTE — Progress Notes (Signed)
[place] @ [facility] Telephone:([phone]) [phone]  Fax:(336) [phone]     NINI CAVAN OB: [birth year]  MR#: [id number]  BSJ#:[id number]  Patient Care Team: Lavera Guise, MD as PCP - General (Internal Medicine) Robert Bellow, MD (General Surgery) Dion Body, MD as Referring Physician (Family Medicine) Forest Gleason, MD (Oncology)  CHIEF COMPLAINT:  Chief Complaint  Patient presents with  . Breast Cancer    Oncology History   1.carcinoma of breast(left) T1 C. N0 M0 status post ultrasound-guided biopsy Estrogen receptor positive. Progesterone receptor positive.  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 chemotherapy on hold since November because of cellulitis in the chest wall. 4.started on Herceptin May 22, 2014.  Cause of recurrent chest wall  infection chemotherapy was put on hold. 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  Patient is on multiple antibiotic and as per infectious disease specialist similar to stay on antibiotic for 4 months. So chemotherapy has been discontinued. Maintenance Herceptin as well as patient was started on letrozole from June 12, 2014 6.  Port was removed because of Pseudomonas infection of the pocket (August, 2016) 7.  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    He should not is here for further follow-up regarding carcinoma breast.  Patient has been off antibiotics.  Taking letrozole had a mammogram done and reported to be negative was seen by Dr. Bary Castilla. Patient complains of joint pain mainly in both hands.  And some other joints.  Here for further follow-up and treatment consideration Patient is having increasing headache abdominal discomfort right and left side.  Bloating.  Feeling weak tired.  Low-grade fever. He should not add a CT scan of abdomen done also MRI scan of brain has been ordered by primary care physician. Patient had some numbness around the lips and patient has been taken off from Aromasin following that according to her numbness improved. She is here with her husband to discuss all the symptoms and further planning of treatment   REVIEW OF SYSTEMS:  Gen. status: General status is improved.  No chills or fever. HEENT: No headache no dizziness Lungs: Shortness of breath has improved.  Cough is improved.  No fever.   Cardiac: No chest pain or paroxysmal nocturnal dyspnea GI: No nausea no vomiting no diarrhea As described about persistent pain and bloating GU: No dysuria hematuria Musculoskeletal system no bony pain  Skin: No rash Neurological system: : As described about numbness surrounding mouth which is intermittent Chest wall tenderness at the port site about port site.  Slight redness at the previous incision site  Multiple medical complaint as described about Past Medical History  Diagnosis Date  . Asthma   .  Collapsed lung 2007  . Diffuse cystic mastopathy   . Arthritis 2006  . Sinus problem   . Cancer [facility]) 2006    Renal cell carcinoma; cryosurgery treatment  . Breast cancer ([place]) 2015    chemo, herception  . Diabetes mellitus without complication ([place])  0093    Metformin    PAST SURGICAL HISTORY: Past Surgical History  Procedure Laterality Date  . Cesarean section  1991  . Cyst removal neck  10/2011    Dr. Tami Ribas  . Cholecystectomy  1990  . Cryoablation  2005, 2010  . Portacath placement  11/24/13  . Incision and drainage Left Dec 2015, Feb 2016    Dr Tula Nakayama  . Breast surgery Left 10/2011    Cyst Aspirationapocrine metaplasia, ductal cells and bone cells, hypo-cellular  . Breast surgery Left 2009    ADH on stereotactic biopsy, 1.5 mm focus.  . Breast surgery Left 2003    fibrocystic changes with ductal hyperplasia without atypia.  . Breast surgery Left     mastectomy  . Breast surgery Left April 11, 2014    Removal of implant, debridement chest wall Dr.Coan  . Breast biopsy Right 2005    negative  . Breast biopsy Left 2009    negative  . Breast biopsy Left 2015    positive  . Mastectomy Left 11/24/2013    positive  . Colonoscopy  08/26/2013    Verdie Shire, M.D. normal.    FAMILY HISTORY Family History  Problem Relation Age of Onset  . Breast cancer Paternal Aunt 22  . Ovarian cancer Maternal Grandmother   . Liver cancer Father   . Diabetes Mother   . Hypertension Mother          ADVANCED DIRECTIVES:  patient does have advance care directive    HEALTH MAINTENANCE: Social History  Substance Use Topics  . Smoking status: Never Smoker   . Smokeless tobacco: Never Used  . Alcohol Use: Yes     Comment: occasionally    :  Allergies  Allergen Reactions  . Exemestane Anaphylaxis  . Linezolid Other (See Comments)  . Taxotere [Docetaxel] Anaphylaxis  . Floxin [Ofloxacin] Other (See Comments)    systemic  . Hydrocodone Itching  . Sulfa Antibiotics Other (See Comments)    blisters  . Codeine Rash  . Penicillins Rash  . Vancomycin Rash    Current Outpatient Prescriptions  Medication Sig Dispense Refill  . acetaminophen (TYLENOL) 325 MG tablet Take 650 mg by mouth every 6 (six) hours as needed for mild  pain.    . Calcium Carb-Cholecalciferol (CALCIUM + D3) 600-200 MG-UNIT TABS Take 1 tablet by mouth 2 (two) times daily. 180 tablet 3  . canagliflozin (INVOKANA) 300 MG TABS tablet TAKE 1 TABLET BY MOUTH EVERY DAY    . doxepin (SINEQUAN) 10 MG capsule TAKE ONE CAPSULE BY MOUTH EVERY EVENING FOR INSOMNIA    . etodolac (LODINE) 400 MG tablet TAKE 1 TABLET BY MOUTH 3 TIMES A DAY AS NEEDED FOR PAIN    . gabapentin (NEURONTIN) 600 MG tablet Take 600 mg by mouth 3 (three) times daily.  2  . imipramine (TOFRANIL) 25 MG tablet Take 25 mg by mouth at bedtime.    Marland Kitchen letrozole (FEMARA) 2.5 MG tablet TAKE 1 TABLET BY MOUTH EVERY DAY 30 tablet 5  . levocetirizine (XYZAL) 5 MG tablet Take 5 mg by mouth every evening.    . metFORMIN (GLUCOPHAGE) 500 MG tablet Take 500 mg by mouth QID.    Marland Kitchen  Probiotic Product (PROBIOTIC DAILY PO) Take by mouth.    . sitaGLIPtin (JANUVIA) 50 MG tablet Take 50 mg by mouth daily.    . valsartan-hydrochlorothiazide (DIOVAN-HCT) 80-12.5 MG per tablet Take 1 tablet by mouth daily.     No current facility-administered medications for this visit.    OBJECTIVE:  Filed Vitals:   06/20/15 1006  BP: 126/84  Pulse: 98  Temp: 96.7 F (35.9 C)  Resp: 18     Body mass index is 33.46 kg/(m^2).    ECOG FS:1 - Symptomatic but completely ambulatory  PHYSICAL EXAM:general status: Patient is feeling stronger.  Fully active.  Not any acute distress HEENT:.  No evidence of stomatitis   lExamination of the chest was unremarkable. There were no bony deformities, no asymmetry, and no other abnormalities.  Cardiac exam revealed the PMI to be normally situated and sized. The rhythm was regular and no extrasystoles were noted during several minutes of auscultation. The first and second heart sounds were normal and physiologic splitting of the second heart sound was noted. There were no murmurs, rubs, clicks, or gallops. Examination of breasts: Right breast free of masses.  left breast wound is healed  well.  No redness. Abdomen: Soft liver and spleen not palpable no ascites Examination of the skin revealed no evidence of significant rashes, suspicious appearing nevi or other concerning lesions.. Lymphatic system: Supraclavicular, cervical, axillary, inguinal lymph nodes are not palpable Left chest wall area no evidence of redness or recurrent disease right breast free of masses.  Joint pains may be secondary to letrozole and patient was instructed to stop for a few weeks and report to [place]  LAB RESULTS:  Appointment on 06/20/2015  Component Date Value Ref Range Status  . WBC 06/20/2015 7.1  3.6 - 11.0 K/uL Final  . RBC 06/20/2015 5.30* 3.80 - 5.20 MIL/uL Final  . Hemoglobin 06/20/2015 16.4* 12.0 - 16.0 g/dL Final  . HCT 06/20/2015 47.5* 35.0 - 47.0 % Final  . MCV 06/20/2015 89.7  80.0 - 100.0 fL Final  . MCH 06/20/2015 30.9  26.0 - 34.0 pg Final  . MCHC 06/20/2015 34.4  32.0 - 36.0 g/dL Final  . RDW 06/20/2015 12.6  11.5 - 14.5 % Final  . Platelets 06/20/2015 216  150 - 440 K/uL Final  . Neutrophils Relative % 06/20/2015 61   Final  . Neutro Abs 06/20/2015 4.3  1.4 - 6.5 K/uL Final  . Lymphocytes Relative 06/20/2015 31   Final  . Lymphs Abs 06/20/2015 2.2  1.0 - 3.6 K/uL Final  . Monocytes Relative 06/20/2015 6   Final  . Monocytes Absolute 06/20/2015 0.4  0.2 - 0.9 K/uL Final  . Eosinophils Relative 06/20/2015 2   Final  . Eosinophils Absolute 06/20/2015 0.1  0 - 0.7 K/uL Final  . Basophils Relative 06/20/2015 0   Final  . Basophils Absolute 06/20/2015 0.0  0 - 0.1 K/uL Final  . Sodium 06/20/2015 134* 135 - 145 mmol/L Final  . Potassium 06/20/2015 3.8  3.5 - 5.1 mmol/L Final  . Chloride 06/20/2015 100* 101 - 111 mmol/L Final  . CO2 06/20/2015 24  22 - 32 mmol/L Final  . Glucose, Bld 06/20/2015 149* 65 - 99 mg/dL Final  . BUN 06/20/2015 23* 6 - 20 mg/dL Final  . Creatinine, Ser 06/20/2015 0.56  0.44 - 1.00 mg/dL Final  . Calcium 06/20/2015 9.4  8.9 - 10.3 mg/dL Final  . Total  Protein 06/20/2015 7.6  6.5 - 8.1 g/dL Final  .  Albumin 06/20/2015 4.2  3.5 - 5.0 g/dL Final  . AST 06/20/2015 41  15 - 41 U/L Final  . ALT 06/20/2015 56* 14 - 54 U/L Final  . Alkaline Phosphatase 06/20/2015 107  38 - 126 U/L Final  . Total Bilirubin 06/20/2015 0.7  0.3 - 1.2 mg/dL Final  . GFR calc non Af Amer 06/20/2015 >60  >60 mL/min Final  . GFR calc Af Amer 06/20/2015 >60  >60 mL/min Final   Comment: (NOTE) The eGFR has been calculated using the CKD EPI equation. This calculation has not been validated in all clinical situations. eGFR's persistently <60 mL/min signify possible Chronic Kidney Disease.   Georgiann Hahn gap 06/20/2015 10  5 - 15 Final  Hospital Outpatient Visit on 06/18/2015  Component Date Value Ref Range Status  . Creatinine, Ser 06/18/2015 0.60  0.44 - 1.00 mg/dL Final    Lab Results  Component Value Date   LABCA2 29.7 10/14/2013   No results found for: CA199 Lab Results  Component Value Date   CEA 1.8 10/14/2013     STUDIES: US Abdomen Complete  06/05/2015  CLINICAL DATA:  Upper abdominal pain and nausea. Prior cholecystectomy. EXAM: ABDOMEN ULTRASOUND COMPLETE COMPARISON:  01/27/2014 FINDINGS: Gallbladder: Surgically absent Common bile duct: Diameter: 2.2 mm Liver: Diffuse increased echogenicity compatible with hepatic steatosis or fatty infiltration. No biliary dilatation. Patent portal vein with normal hepatopetal flow. No definite focal hepatic abnormality by ultrasound. IVC: No abnormality visualized. Pancreas: Visualized portion unremarkable. Spleen: Size and appearance within normal limits. Right Kidney: Length: 12.4 cm. No hydronephrosis. Calcified echogenic and shadowing right kidney lower pole area measures 3.1 x 2.5 x 4.1 cm. This correlates with a previous ablation defect by comparison CT exams. Left Kidney: Length: 14.3 cm. Echogenicity within normal limits. No mass or hydronephrosis visualized. Abdominal aorta: No aneurysm visualized. Other findings:  None. IMPRESSION: Prior cholecystectomy Hepatic steatosis Calcified echogenic and shadowing right lower pole exophytic 4.1 cm defect, better characterized by several prior comparison contrast CTs. Electronically Signed   By: Jerilynn Mages.  Shick M.D.   On: 06/05/2015 08:43   Ct Abdomen Pelvis W Contrast  06/18/2015  CLINICAL DATA:  Generalized abdominal pain. EXAM: CT ABDOMEN AND PELVIS WITH CONTRAST TECHNIQUE: Multidetector CT imaging of the abdomen and pelvis was performed using the standard protocol following bolus administration of intravenous contrast. CONTRAST:  12m OMNIPAQUE IOHEXOL 300 MG/ML  SOLN COMPARISON:  CT scan of December 29, 2012. FINDINGS: Multilevel degenerative disc disease is noted in the lower lumbar spine. Visualized lung bases are unremarkable. Status post cholecystectomy. No focal abnormality is noted in the liver, spleen or pancreas. Adrenal glands appear normal. No hydronephrosis or renal obstruction is noted. Left kidney and ureter appear normal. Stable ablation site is seen in lower pole of right kidney. Atherosclerosis of abdominal aorta is noted without aneurysm formation. The appendix appears normal. There is no evidence of bowel obstruction. Large amount of stool is seen in the descending and sigmoid colon suggesting constipation. Urinary bladder and uterus appear normal. Ovaries appear normal. No abnormal fluid collection is noted. No significant adenopathy is noted. There are noted dilated venous collateral veins posterior to the gastric cardia which run from the splenic vein to left renal vein. These are unchanged compared to prior exam. Etiology is unknown. IMPRESSION: Stable ablation site seen involving lower pole of right kidney. No definite evidence of recurrent mass or neoplasm is noted. Large amount of stool is seen in the descending and sigmoid colon suggesting constipation. Atherosclerosis of  abdominal aorta is noted without aneurysm formation. 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 Electronically Signed   By: Marijo Conception, M.D.   On: 06/18/2015 09:48    ASSESSMENT   Cancer of breast stage IC Patient has stopped taking Aromasin and reported some improvement in numbness. Ct  scan of abdomen has been reviewed patient has significant constipation. CT SCAN has been reviewed independently. MRI scan of the brain is pending which will be reviewed. Some of the abdominal complaints.   May be related to constipation explain her to take Metamucil and stool softener If MRI scan of brain is negative possibility of anti-hormonal therapy with tamoxifen can be considered. Some of the patient's symptoms may be postmenopausal and this has been discussed with the patient and family. Reevaluate patient in 4 weeks for consideration of starting tamoxifen       Clinical: Stage IA (T1c, N0, M0) - Unsigned   Forest Gleason, MD   06/20/2015 1:22 PM

## 2015-06-21 ENCOUNTER — Ambulatory Visit
Admission: RE | Admit: 2015-06-21 | Discharge: 2015-06-21 | Disposition: A | Discharge status: Home / Self Care | Payer: BLUE CROSS/BLUE SHIELD | Source: Ambulatory Visit | Attending: Nurse Practitioner | Admitting: Nurse Practitioner

## 2015-06-21 DIAGNOSIS — Z85528 Personal history of other malignant neoplasm of kidney: Secondary | ICD-10-CM | POA: Insufficient documentation

## 2015-06-21 DIAGNOSIS — R10817 Generalized abdominal tenderness: Secondary | ICD-10-CM

## 2015-06-21 DIAGNOSIS — R55 Syncope and collapse: Secondary | ICD-10-CM | POA: Insufficient documentation

## 2015-06-21 DIAGNOSIS — R42 Dizziness and giddiness: Secondary | ICD-10-CM | POA: Diagnosis not present

## 2015-06-21 DIAGNOSIS — R410 Disorientation, unspecified: Secondary | ICD-10-CM | POA: Insufficient documentation

## 2015-06-21 LAB — CANCER ANTIGEN 27.29: CA 27.29: 27.2 U/mL (ref 0.0–38.6)

## 2015-06-26 ENCOUNTER — Ambulatory Visit: Payer: BLUE CROSS/BLUE SHIELD

## 2015-06-27 ENCOUNTER — Ambulatory Visit
Admission: RE | Admit: 2015-06-27 | Discharge: 2015-06-27 | Disposition: A | Discharge status: Home / Self Care | Payer: BLUE CROSS/BLUE SHIELD | Source: Ambulatory Visit | Attending: Oncology | Admitting: Oncology

## 2015-06-27 DIAGNOSIS — Z13820 Encounter for screening for osteoporosis: Secondary | ICD-10-CM | POA: Insufficient documentation

## 2015-07-02 ENCOUNTER — Other Ambulatory Visit: Payer: Self-pay | Admitting: Internal Medicine

## 2015-07-26 ENCOUNTER — Ambulatory Visit: Payer: BLUE CROSS/BLUE SHIELD | Admitting: Oncology

## 2015-08-03 ENCOUNTER — Inpatient Hospital Stay: Payer: BLUE CROSS/BLUE SHIELD | Attending: Oncology | Admitting: Oncology

## 2015-08-03 VITALS — BP 142/90 | HR 88 | Temp 96.0°F | Resp 18 | Wt 205.5 lb

## 2015-08-03 DIAGNOSIS — Z9012 Acquired absence of left breast and nipple: Secondary | ICD-10-CM | POA: Diagnosis not present

## 2015-08-03 DIAGNOSIS — Z7984 Long term (current) use of oral hypoglycemic drugs: Secondary | ICD-10-CM | POA: Diagnosis not present

## 2015-08-03 DIAGNOSIS — Z888 Allergy status to other drugs, medicaments and biological substances status: Secondary | ICD-10-CM | POA: Diagnosis not present

## 2015-08-03 DIAGNOSIS — Z9221 Personal history of antineoplastic chemotherapy: Secondary | ICD-10-CM | POA: Diagnosis not present

## 2015-08-03 DIAGNOSIS — R200 Anesthesia of skin: Secondary | ICD-10-CM | POA: Diagnosis not present

## 2015-08-03 DIAGNOSIS — M1990 Unspecified osteoarthritis, unspecified site: Secondary | ICD-10-CM | POA: Insufficient documentation

## 2015-08-03 DIAGNOSIS — E119 Type 2 diabetes mellitus without complications: Secondary | ICD-10-CM

## 2015-08-03 DIAGNOSIS — M25541 Pain in joints of right hand: Secondary | ICD-10-CM | POA: Insufficient documentation

## 2015-08-03 DIAGNOSIS — Z9882 Breast implant status: Secondary | ICD-10-CM | POA: Diagnosis not present

## 2015-08-03 DIAGNOSIS — Z170 Estrogen receptor positive status [ER+]: Secondary | ICD-10-CM

## 2015-08-03 DIAGNOSIS — M25542 Pain in joints of left hand: Secondary | ICD-10-CM | POA: Insufficient documentation

## 2015-08-03 DIAGNOSIS — Z79810 Long term (current) use of selective estrogen receptor modulators (SERMs): Secondary | ICD-10-CM

## 2015-08-03 DIAGNOSIS — C50412 Malignant neoplasm of upper-outer quadrant of left female breast: Secondary | ICD-10-CM | POA: Diagnosis not present

## 2015-08-03 DIAGNOSIS — Z79899 Other long term (current) drug therapy: Secondary | ICD-10-CM | POA: Diagnosis not present

## 2015-08-03 DIAGNOSIS — Z8041 Family history of malignant neoplasm of ovary: Secondary | ICD-10-CM | POA: Insufficient documentation

## 2015-08-03 DIAGNOSIS — Z803 Family history of malignant neoplasm of breast: Secondary | ICD-10-CM | POA: Insufficient documentation

## 2015-08-03 DIAGNOSIS — Z8614 Personal history of Methicillin resistant Staphylococcus aureus infection: Secondary | ICD-10-CM | POA: Insufficient documentation

## 2015-08-03 MED ORDER — TAMOXIFEN CITRATE 20 MG PO TABS: 20.0000 mg | ORAL_TABLET | Freq: Every day | ORAL | Status: DC

## 2015-08-03 NOTE — Progress Notes (Signed)
[place] @ [facility] [phone]  [phone]     Danielle Wallace OB: [birth year]  MR#: [id number]  MWN#:[id number]  Patient Care Team: Lavera Guise, MD as PCP - General (Internal Medicine) Robert Bellow, MD (General Surgery) Dion Body, MD as Referring Physician (Family Medicine) Forest Gleason, MD (Oncology)  CHIEF COMPLAINT:  Chief Complaint  Patient presents with  . Breast Cancer     Oncology History   1.carcinoma of breast(left) T1 C. N0 M0 status post ultrasound-guided biopsy Estrogen receptor positive. Progesterone receptor positive.  HER-2/neu amplified by FISH (3.8)  status pos  tnipple sparing mastectomy on the left side with reconstruction (July, 2016) Final staging is  yP1C yNOsn M0  stage 1 c  2.MRSA infection associated with left breast implant (August, 2015) 3.patient started chemotherapy with [facility] from 11th August, 2015. chemotherapy on hold since November because of cellulitis in the chest wall. 4.started on Herceptin May 22, 2014.  Cause of recurrent chest wall  infection chemotherapy was put on hold. 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  Patient is on multiple antibiotic and as per infectious disease specialist similar to stay on antibiotic for 4 months. So chemotherapy has been discontinued. Maintenance Herceptin as well as patient was started on letrozole from June 12, 2014 6.  Port was removed because of Pseudomonas infection of the pocket (August, 2016) 7.  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    He should not is here for  further follow-up regarding carcinoma breast.  Patient has been off antibiotics.  Taking letrozole had a mammogram done and reported to be negative was seen by Dr. Bary Castilla. Patient complains of joint pain mainly in both hands.  And some other joints. PATIENT  is here for ongoing evaluation and treatment consideration.  Had a sore throat patient has been started on antibiotic with some relief [place] reaction to Aromasin so which has been discontinued.  Now being started on tamoxifen.  Sore throat has improved.  Mammogram being done by Dr. Bary Castilla  REVIEW OF SYSTEMS:  Gen. status: General status is improved.  No chills or fever. HEENT: No headache no dizziness Lungs: Shortness of breath has improved.  Cough is improved.  No fever.   Cardiac: No chest pain or paroxysmal nocturnal dyspnea GI: No nausea no vomiting no diarrhea As described about persistent pain and bloating GU: No dysuria hematuria Musculoskeletal system no bony pain  Skin: No rash Neurological system: : As described about numbness surrounding mouth which is intermittent Chest wall tenderness at the port site about port site.  Slight redness at the previous incision site  Multiple medical complaint as described about Past Medical History  Diagnosis Date  . Asthma   . Collapsed lung 2007  . Diffuse cystic mastopathy   . Arthritis 2006  . Sinus problem   . Cancer [facility]) 2006    Renal cell carcinoma; cryosurgery treatment  . Breast cancer ([place]) 2015    chemo, herception  . Diabetes mellitus without complication ([place]) 4034    Metformin    PAST SURGICAL HISTORY: Past Surgical History  Procedure Laterality Date  .  Cesarean section  1991  . Cyst removal neck  10/2011    Dr. Tami Ribas  . Cholecystectomy  1990  . Cryoablation  2005, 2010  . Portacath placement  11/24/13  . Incision and drainage Left Dec 2015, Feb 2016    Dr Tula Nakayama  . Breast surgery Left 10/2011    Cyst Aspirationapocrine metaplasia, ductal cells and  bone cells, hypo-cellular  . Breast surgery Left 2009    ADH on stereotactic biopsy, 1.5 mm focus.  . Breast surgery Left 2003    fibrocystic changes with ductal hyperplasia without atypia.  . Breast surgery Left     mastectomy  . Breast surgery Left April 11, 2014    Removal of implant, debridement chest wall Dr.Coan  . Breast biopsy Right 2005    negative  . Breast biopsy Left 2009    negative  . Breast biopsy Left 2015    positive  . Mastectomy Left 11/24/2013    positive  . Colonoscopy  08/26/2013    Verdie Shire, M.D. normal.    FAMILY HISTORY Family History  Problem Relation Age of Onset  . Breast cancer Paternal Aunt 36  . Ovarian cancer Maternal Grandmother   . Liver cancer Father   . Diabetes Mother   . Hypertension Mother          ADVANCED DIRECTIVES:  patient does have advance care directive    HEALTH MAINTENANCE: Social History  Substance Use Topics  . Smoking status: Never Smoker   . Smokeless tobacco: Never Used  . Alcohol Use: Yes     Comment: occasionally    :  Allergies  Allergen Reactions  . Exemestane Anaphylaxis  . Linezolid Other (See Comments)  . Taxotere [Docetaxel] Anaphylaxis  . Floxin [Ofloxacin] Other (See Comments)    systemic  . Hydrocodone Itching  . Sulfa Antibiotics Other (See Comments)    blisters  . Codeine Rash  . Penicillins Rash  . Vancomycin Rash    Current Outpatient Prescriptions  Medication Sig Dispense Refill  . acetaminophen (TYLENOL) 325 MG tablet Take 650 mg by mouth every 6 (six) hours as needed for mild pain.    Marland Kitchen azithromycin (ZITHROMAX) 250 MG tablet Take by mouth daily.    . Calcium Carb-Cholecalciferol (CALCIUM + D3) 600-200 MG-UNIT TABS Take 1 tablet by mouth 2 (two) times daily. 180 tablet 3  . canagliflozin (INVOKANA) 300 MG TABS tablet TAKE 1 TABLET BY MOUTH EVERY DAY    . doxepin (SINEQUAN) 10 MG capsule TAKE ONE CAPSULE BY MOUTH EVERY EVENING FOR INSOMNIA    . etodolac (LODINE) 400 MG tablet  TAKE 1 TABLET BY MOUTH 3 TIMES A DAY AS NEEDED FOR PAIN    . gabapentin (NEURONTIN) 600 MG tablet Take 600 mg by mouth 3 (three) times daily.  2  . imipramine (TOFRANIL) 25 MG tablet Take 25 mg by mouth at bedtime.    Marland Kitchen letrozole (FEMARA) 2.5 MG tablet TAKE 1 TABLET BY MOUTH EVERY DAY 30 tablet 5  . levocetirizine (XYZAL) 5 MG tablet Take 5 mg by mouth every evening.    . metFORMIN (GLUCOPHAGE) 500 MG tablet Take 500 mg by mouth QID.    Marland Kitchen Probiotic Product (PROBIOTIC DAILY PO) Take by mouth.    . sitaGLIPtin (JANUVIA) 50 MG tablet Take 50 mg by mouth daily.    . valsartan-hydrochlorothiazide (DIOVAN-HCT) 80-12.5 MG per tablet Take 1 tablet by mouth daily.     No current facility-administered medications for this visit.    OBJECTIVE:  Filed Vitals:   08/03/15 1400  BP: 142/90  Pulse: 88  Temp: 96 F (35.6 C)  Resp: 18     Body mass index is 34.19 kg/(m^2).    ECOG FS:1 - Symptomatic but completely ambulatory  PHYSICAL EXAM:general status: Patient is feeling stronger.  Fully active.  Not any acute distress HEENT:.  No evidence of stomatitis   lExamination of the chest was unremarkable. There were no bony deformities, no asymmetry, and no other abnormalities.  Cardiac exam revealed the PMI to be normally situated and sized. The rhythm was regular and no extrasystoles were noted during several minutes of auscultation. The first and second heart sounds were normal and physiologic splitting of the second heart sound was noted. There were no murmurs, rubs, clicks, or gallops. Examination of breasts: Right breast free of masses.  left breast wound is healed well.  No redness. Abdomen: Soft liver and spleen not palpable no ascites Examination of the skin revealed no evidence of significant rashes, suspicious appearing nevi or other concerning lesions.. Lymphatic system: Supraclavicular, cervical, axillary, inguinal lymph nodes are not palpable Left chest wall area no evidence of redness or  recurrent disease right breast free of masses.    LAB RESULTS:  No visits with results within 3 Day(s) from this visit. Latest known visit with results is:  Appointment on 06/20/2015  Component Date Value Ref Range Status  . WBC 06/20/2015 7.1  3.6 - 11.0 K/uL Final  . RBC 06/20/2015 5.30* 3.80 - 5.20 MIL/uL Final  . Hemoglobin 06/20/2015 16.4* 12.0 - 16.0 g/dL Final  . HCT 06/20/2015 47.5* 35.0 - 47.0 % Final  . MCV 06/20/2015 89.7  80.0 - 100.0 fL Final  . MCH 06/20/2015 30.9  26.0 - 34.0 pg Final  . MCHC 06/20/2015 34.4  32.0 - 36.0 g/dL Final  . RDW 06/20/2015 12.6  11.5 - 14.5 % Final  . Platelets 06/20/2015 216  150 - 440 K/uL Final  . Neutrophils Relative % 06/20/2015 61   Final  . Neutro Abs 06/20/2015 4.3  1.4 - 6.5 K/uL Final  . Lymphocytes Relative 06/20/2015 31   Final  . Lymphs Abs 06/20/2015 2.2  1.0 - 3.6 K/uL Final  . Monocytes Relative 06/20/2015 6   Final  . Monocytes Absolute 06/20/2015 0.4  0.2 - 0.9 K/uL Final  . Eosinophils Relative 06/20/2015 2   Final  . Eosinophils Absolute 06/20/2015 0.1  0 - 0.7 K/uL Final  . Basophils Relative 06/20/2015 0   Final  . Basophils Absolute 06/20/2015 0.0  0 - 0.1 K/uL Final  . Sodium 06/20/2015 134* 135 - 145 mmol/L Final  . Potassium 06/20/2015 3.8  3.5 - 5.1 mmol/L Final  . Chloride 06/20/2015 100* 101 - 111 mmol/L Final  . CO2 06/20/2015 24  22 - 32 mmol/L Final  . Glucose, Bld 06/20/2015 149* 65 - 99 mg/dL Final  . BUN 06/20/2015 23* 6 - 20 mg/dL Final  . Creatinine, Ser 06/20/2015 0.56  0.44 - 1.00 mg/dL Final  . Calcium 06/20/2015 9.4  8.9 - 10.3 mg/dL Final  . Total Protein 06/20/2015 7.6  6.5 - 8.1 g/dL Final  . Albumin 06/20/2015 4.2  3.5 - 5.0 g/dL Final  . AST 06/20/2015 41  15 - 41 U/L Final  . ALT 06/20/2015 56* 14 - 54 U/L Final  . Alkaline Phosphatase 06/20/2015 107  38 - 126 U/L Final  . Total Bilirubin 06/20/2015 0.7  0.3 - 1.2 mg/dL Final  . GFR calc non  Af Amer 06/20/2015 >60  >60 mL/min Final  .  GFR calc Af Amer 06/20/2015 >60  >60 mL/min Final   Comment: (NOTE) The eGFR has been calculated using the CKD EPI equation. This calculation has not been validated in all clinical situations. eGFR's persistently <60 mL/min signify possible Chronic Kidney Disease.   . Anion gap 06/20/2015 10  5 - 15 Final  . CA 27.29 06/20/2015 27.2  0.0 - 38.6 U/mL Final   Comment: (NOTE) [employer]/ACS methodology Performed At: [facility] [place], [place] [id number] Lindon Romp MD QM:[id number]     Lab Results  Component Value Date   LABCA2 27.2 06/20/2015   No results found for: CA199 Lab Results  Component Value Date   CEA 1.8 10/14/2013     STUDIES: No results found.  ASSESSMENT   Cancer of breast stage IC Patient has stopped taking Aromasin and reported some improvement in numbness. Patient will be started on tamoxifen 20 mg by mouth daily.  Risk of tamoxifen for deep vein thrombosis as well as increased incidence of carcinoma of endometrium has been discussed. Patient is being followed regularly by GYN physician ([facility]) Laboratory data has been reviewed.  Reevaluation of patient in the 6 month.  .  The cause of my plan retirement my associatE will take care of the patient     Clinical: Stage IA (T1c, N0, M0) - Marni Griffon, MD   08/03/2015 2:39 PM

## 2015-08-04 ENCOUNTER — Encounter: Payer: Self-pay | Admitting: Oncology

## 2015-08-09 ENCOUNTER — Other Ambulatory Visit: Payer: Self-pay | Admitting: *Deleted

## 2015-08-09 DIAGNOSIS — C50412 Malignant neoplasm of upper-outer quadrant of left female breast: Secondary | ICD-10-CM

## 2015-08-14 ENCOUNTER — Encounter: Payer: Self-pay | Admitting: *Deleted

## 2015-08-16 DIAGNOSIS — Z6834 Body mass index (BMI) 34.0-34.9, adult: Secondary | ICD-10-CM | POA: Diagnosis not present

## 2015-08-16 DIAGNOSIS — N393 Stress incontinence (female) (male): Secondary | ICD-10-CM | POA: Diagnosis not present

## 2015-08-16 DIAGNOSIS — Z885 Allergy status to narcotic agent status: Secondary | ICD-10-CM | POA: Diagnosis not present

## 2015-08-16 DIAGNOSIS — N23 Unspecified renal colic: Secondary | ICD-10-CM | POA: Diagnosis not present

## 2015-08-16 DIAGNOSIS — H9319 Tinnitus, unspecified ear: Secondary | ICD-10-CM | POA: Diagnosis not present

## 2015-08-16 DIAGNOSIS — C50919 Malignant neoplasm of unspecified site of unspecified female breast: Secondary | ICD-10-CM | POA: Diagnosis not present

## 2015-08-16 DIAGNOSIS — Z5181 Encounter for therapeutic drug level monitoring: Secondary | ICD-10-CM | POA: Diagnosis not present

## 2015-08-16 DIAGNOSIS — Z79810 Long term (current) use of selective estrogen receptor modulators (SERMs): Secondary | ICD-10-CM | POA: Diagnosis not present

## 2015-08-16 DIAGNOSIS — Z880 Allergy status to penicillin: Secondary | ICD-10-CM | POA: Diagnosis not present

## 2015-08-16 DIAGNOSIS — Z79811 Long term (current) use of aromatase inhibitors: Secondary | ICD-10-CM | POA: Diagnosis not present

## 2015-08-16 DIAGNOSIS — Z85528 Personal history of other malignant neoplasm of kidney: Secondary | ICD-10-CM | POA: Diagnosis not present

## 2015-08-16 DIAGNOSIS — M1990 Unspecified osteoarthritis, unspecified site: Secondary | ICD-10-CM | POA: Diagnosis not present

## 2015-08-16 DIAGNOSIS — C641 Malignant neoplasm of right kidney, except renal pelvis: Secondary | ICD-10-CM | POA: Diagnosis not present

## 2015-08-16 DIAGNOSIS — R109 Unspecified abdominal pain: Secondary | ICD-10-CM | POA: Diagnosis not present

## 2015-08-16 DIAGNOSIS — E119 Type 2 diabetes mellitus without complications: Secondary | ICD-10-CM | POA: Diagnosis not present

## 2015-08-16 DIAGNOSIS — N6019 Diffuse cystic mastopathy of unspecified breast: Secondary | ICD-10-CM | POA: Diagnosis not present

## 2015-09-19 ENCOUNTER — Inpatient Hospital Stay: Payer: BLUE CROSS/BLUE SHIELD | Admitting: Oncology

## 2015-09-19 ENCOUNTER — Inpatient Hospital Stay: Payer: BLUE CROSS/BLUE SHIELD

## 2015-10-15 ENCOUNTER — Ambulatory Visit
Admission: RE | Admit: 2015-10-15 | Discharge: 2015-10-15 | Disposition: A | Discharge status: Home / Self Care | Payer: BLUE CROSS/BLUE SHIELD | Source: Ambulatory Visit | Attending: General Surgery | Admitting: General Surgery

## 2015-10-15 ENCOUNTER — Other Ambulatory Visit: Payer: Self-pay | Admitting: General Surgery

## 2015-10-15 DIAGNOSIS — C50412 Malignant neoplasm of upper-outer quadrant of left female breast: Secondary | ICD-10-CM

## 2015-10-15 DIAGNOSIS — Z1231 Encounter for screening mammogram for malignant neoplasm of breast: Secondary | ICD-10-CM | POA: Insufficient documentation

## 2015-10-24 ENCOUNTER — Ambulatory Visit: Payer: BLUE CROSS/BLUE SHIELD | Admitting: General Surgery

## 2015-10-26 DIAGNOSIS — E119 Type 2 diabetes mellitus without complications: Secondary | ICD-10-CM | POA: Diagnosis not present

## 2015-11-01 ENCOUNTER — Ambulatory Visit (INDEPENDENT_AMBULATORY_CARE_PROVIDER_SITE_OTHER): Payer: BLUE CROSS/BLUE SHIELD | Admitting: General Surgery

## 2015-11-01 ENCOUNTER — Encounter: Payer: Self-pay | Admitting: General Surgery

## 2015-11-01 VITALS — BP 118/72 | HR 78 | Resp 14 | Ht 65.0 in | Wt 205.0 lb

## 2015-11-01 DIAGNOSIS — C50412 Malignant neoplasm of upper-outer quadrant of left female breast: Secondary | ICD-10-CM

## 2015-11-01 NOTE — Patient Instructions (Addendum)
Labs today Take a Baby Aspirin daily with your Tamoxifen. Follow up in one year with mammogram and office visit.

## 2015-11-01 NOTE — Progress Notes (Signed)
Patient ID: Danielle Wallace, female   DOB: December 25, 1960, 55 y.o.   MRN: [id number]  Chief Complaint  Patient presents with  . Follow-up    mammogram    HPI Danielle Wallace is a 55 y.o. female who presents for a breast evaluation. The most recent mammogram was done on 10/15/15. Patient does perform regular self breast checks and gets regular mammograms done. She reports that she has some itching with the right breast. She reports some fullness in the left axilla.   I personally reviewed the patient's history.   HPI  Past Medical History  Diagnosis Date  . Asthma   . Collapsed lung 2007  . Diffuse cystic mastopathy   . Arthritis 2006  . Sinus problem   . Cancer [facility]) 2006    Renal cell carcinoma; cryosurgery treatment  . Diabetes mellitus without complication ([place]) 5573    Metformin  . Breast cancer ([place]) 10/14/2013    chemo, herception    Past Surgical History  Procedure Laterality Date  . Cesarean section  1991  . Cyst removal neck  10/2011    Dr. Tami Ribas  . Cholecystectomy  1990  . Cryoablation  2005, 2010  . Portacath placement  11/24/13  . Incision and drainage Left Dec 2015, Feb 2016    Dr Tula Nakayama  . Breast surgery Left 10/2011    Cyst Aspirationapocrine metaplasia, ductal cells and bone cells, hypo-cellular  . Breast surgery Left 2009    ADH on stereotactic biopsy, 1.5 mm focus.  . Breast surgery Left 2003    fibrocystic changes with ductal hyperplasia without atypia.  . Breast surgery Left     mastectomy  . Breast surgery Left April 11, 2014    Removal of implant, debridement chest wall Dr.Coan  . Colonoscopy  08/26/2013    Verdie Shire, M.D. normal.  . Breast biopsy Right 2005    negative  . Breast biopsy Left 08/23/2007    negative  . Breast biopsy Left 10/24/2013    positive  . Breast biopsy Left 07/2002    neg  . Breast biopsy Left 08/23/2007    neg  . Mastectomy Left 11/24/2013    positive    Family History  Problem Relation Age of Onset  . Breast cancer  Paternal Aunt 30  . Ovarian cancer Maternal Grandmother   . Liver cancer Father   . Diabetes Mother   . Hypertension Mother     Social History Social History  Substance Use Topics  . Smoking status: Never Smoker   . Smokeless tobacco: Never Used  . Alcohol Use: Yes     Comment: occasionally    Allergies  Allergen Reactions  . Exemestane Anaphylaxis  . Linezolid Other (See Comments)  . Taxotere [Docetaxel] Anaphylaxis  . Floxin [Ofloxacin] Other (See Comments)    systemic  . Hydrocodone Itching  . Sulfa Antibiotics Other (See Comments)    blisters  . Codeine Rash  . Penicillins Rash  . Vancomycin Rash    Current Outpatient Prescriptions  Medication Sig Dispense Refill  . Calcium Carb-Cholecalciferol (CALCIUM + D3) 600-200 MG-UNIT TABS Take 1 tablet by mouth 2 (two) times daily. 180 tablet 3  . canagliflozin (INVOKANA) 300 MG TABS tablet TAKE 1 TABLET BY MOUTH EVERY DAY    . etodolac (LODINE) 400 MG tablet TAKE 1 TABLET BY MOUTH 3 TIMES A DAY AS NEEDED FOR PAIN    . gabapentin (NEURONTIN) 600 MG tablet Take 600 mg by mouth 3 (three) times daily.  2  . glipiZIDE (GLUCOTROL XL) 5 MG 24 hr tablet Take 5 mg by mouth daily.    Marland Kitchen imipramine (TOFRANIL) 25 MG tablet Take 25 mg by mouth at bedtime.    Marland Kitchen levocetirizine (XYZAL) 5 MG tablet Take 5 mg by mouth every evening.    . metFORMIN (GLUCOPHAGE) 500 MG tablet Take 500 mg by mouth QID.    Marland Kitchen Probiotic Product (PROBIOTIC DAILY PO) Take by mouth.    . tamoxifen (NOLVADEX) 20 MG tablet Take 1 tablet (20 mg total) by mouth daily. 30 tablet 6  . valsartan-hydrochlorothiazide (DIOVAN-HCT) 80-12.5 MG per tablet Take 1 tablet by mouth daily.     No current facility-administered medications for this visit.    Review of Systems Review of Systems  Constitutional: Negative.   Respiratory: Negative.   Cardiovascular: Negative.     Blood pressure 118/72, pulse 78, resp. rate 14, height 5' 5"  (1.651 m), weight 205 lb (92.987  kg).  Physical Exam Physical Exam  Constitutional: She is oriented to person, place, and time. She appears well-developed and well-nourished.  Eyes: Conjunctivae are normal. No scleral icterus.  Neck: Neck supple.  Cardiovascular: Normal rate, regular rhythm and normal heart sounds.   Pulmonary/Chest: Effort normal and breath sounds normal. Right breast exhibits no inverted nipple, no mass, no nipple discharge, no skin change and no tenderness.    Left mastectomy site well healed.   Musculoskeletal:       Arms: Lymphadenopathy:    She has no cervical adenopathy.    She has no axillary adenopathy.       Left: No supraclavicular adenopathy present.  Neurological: She is alert and oriented to person, place, and time.  Skin: Skin is warm and dry.  Psychiatric: She has a normal mood and affect.    Data Reviewed Right breast screening mammogram dated 10/15/2015 was reviewed. BI-RADS-1.  LFTs in the system were reviewed. Minimal elevation of the serum transaminases rounding the change from an aromatase inhibitor to tamoxifen in March.   Assessment    Benign breast exam.    Plan    We'll arrange for a follow-up right screening mammogram in 1 year.  The scant elevation in the Epic records would make it unlikely that she had significant hepatic capsule swelling to account for the vague right upper quadrant pain she experienced earlier this spring. Considering there was a concern that the aromatase inhibitor was responsible for some of her clinical symptoms it was recommended that a liver panel be obtained today to be sure that a similar process was not underway since the switch to tamoxifen.     PCP: Clayborn Bigness This has been scribed by Lesly Rubenstein LPN    Robert Bellow 11/01/2015, 8:37 PM

## 2015-11-02 LAB — HEPATIC FUNCTION PANEL
ALT: 53 IU/L — ABNORMAL HIGH (ref 0–32)
AST: 36 IU/L (ref 0–40)
Albumin: 4.2 g/dL (ref 3.5–5.5)
Alkaline Phosphatase: 110 IU/L (ref 39–117)
Bilirubin Total: 0.3 mg/dL (ref 0.0–1.2)
Bilirubin, Direct: 0.14 mg/dL (ref 0.00–0.40)
Total Protein: 7.2 g/dL (ref 6.0–8.5)

## 2015-11-05 ENCOUNTER — Telehealth: Payer: Self-pay

## 2015-11-05 NOTE — Telephone Encounter (Signed)
-----   Message from Robert Bellow, MD sent at 11/05/2015  4:46 PM EDT ----- Please notify the patient that the liver function studies are stable. No additional follow-up is required at this time. Continue her present medications. ----- Message -----    From: [facility] Lab Results In Interface    Sent: 11/02/2015   5:38 AM      To: Robert Bellow, MD

## 2015-11-05 NOTE — Telephone Encounter (Signed)
Notified patient as instructed, patient pleased. Discussed follow-up appointments, patient agrees  

## 2015-11-08 DIAGNOSIS — I10 Essential (primary) hypertension: Secondary | ICD-10-CM | POA: Diagnosis not present

## 2015-11-08 DIAGNOSIS — K5900 Constipation, unspecified: Secondary | ICD-10-CM | POA: Diagnosis not present

## 2015-11-08 DIAGNOSIS — R10817 Generalized abdominal tenderness: Secondary | ICD-10-CM | POA: Diagnosis not present

## 2015-11-08 DIAGNOSIS — C50112 Malignant neoplasm of central portion of left female breast: Secondary | ICD-10-CM | POA: Diagnosis not present

## 2016-01-16 DIAGNOSIS — S93432A Sprain of tibiofibular ligament of left ankle, initial encounter: Secondary | ICD-10-CM | POA: Diagnosis not present

## 2016-01-16 DIAGNOSIS — M25572 Pain in left ankle and joints of left foot: Secondary | ICD-10-CM | POA: Diagnosis not present

## 2016-01-21 ENCOUNTER — Other Ambulatory Visit: Payer: Self-pay

## 2016-01-21 DIAGNOSIS — C50412 Malignant neoplasm of upper-outer quadrant of left female breast: Secondary | ICD-10-CM

## 2016-01-23 ENCOUNTER — Inpatient Hospital Stay: Payer: BLUE CROSS/BLUE SHIELD | Attending: Internal Medicine

## 2016-01-23 ENCOUNTER — Inpatient Hospital Stay (HOSPITAL_BASED_OUTPATIENT_CLINIC_OR_DEPARTMENT_OTHER): Payer: BLUE CROSS/BLUE SHIELD | Admitting: Internal Medicine

## 2016-01-23 VITALS — BP 139/85 | HR 90 | Temp 98.2°F | Resp 20 | Ht 65.0 in | Wt 208.0 lb

## 2016-01-23 DIAGNOSIS — Z9221 Personal history of antineoplastic chemotherapy: Secondary | ICD-10-CM | POA: Insufficient documentation

## 2016-01-23 DIAGNOSIS — E119 Type 2 diabetes mellitus without complications: Secondary | ICD-10-CM | POA: Diagnosis not present

## 2016-01-23 DIAGNOSIS — Z9882 Breast implant status: Secondary | ICD-10-CM | POA: Diagnosis not present

## 2016-01-23 DIAGNOSIS — R140 Abdominal distension (gaseous): Secondary | ICD-10-CM | POA: Diagnosis not present

## 2016-01-23 DIAGNOSIS — Z170 Estrogen receptor positive status [ER+]: Secondary | ICD-10-CM | POA: Diagnosis not present

## 2016-01-23 DIAGNOSIS — Z9012 Acquired absence of left breast and nipple: Secondary | ICD-10-CM | POA: Insufficient documentation

## 2016-01-23 DIAGNOSIS — R232 Flushing: Secondary | ICD-10-CM

## 2016-01-23 DIAGNOSIS — Z79810 Long term (current) use of selective estrogen receptor modulators (SERMs): Secondary | ICD-10-CM | POA: Diagnosis not present

## 2016-01-23 DIAGNOSIS — Z85528 Personal history of other malignant neoplasm of kidney: Secondary | ICD-10-CM | POA: Diagnosis not present

## 2016-01-23 DIAGNOSIS — Z880 Allergy status to penicillin: Secondary | ICD-10-CM

## 2016-01-23 DIAGNOSIS — M1990 Unspecified osteoarthritis, unspecified site: Secondary | ICD-10-CM | POA: Diagnosis not present

## 2016-01-23 DIAGNOSIS — Z8614 Personal history of Methicillin resistant Staphylococcus aureus infection: Secondary | ICD-10-CM | POA: Insufficient documentation

## 2016-01-23 DIAGNOSIS — Z79899 Other long term (current) drug therapy: Secondary | ICD-10-CM | POA: Diagnosis not present

## 2016-01-23 DIAGNOSIS — Z888 Allergy status to other drugs, medicaments and biological substances status: Secondary | ICD-10-CM | POA: Diagnosis not present

## 2016-01-23 DIAGNOSIS — R21 Rash and other nonspecific skin eruption: Secondary | ICD-10-CM | POA: Diagnosis not present

## 2016-01-23 DIAGNOSIS — Z803 Family history of malignant neoplasm of breast: Secondary | ICD-10-CM | POA: Diagnosis not present

## 2016-01-23 DIAGNOSIS — Z8041 Family history of malignant neoplasm of ovary: Secondary | ICD-10-CM | POA: Insufficient documentation

## 2016-01-23 DIAGNOSIS — Z808 Family history of malignant neoplasm of other organs or systems: Secondary | ICD-10-CM | POA: Insufficient documentation

## 2016-01-23 DIAGNOSIS — C50412 Malignant neoplasm of upper-outer quadrant of left female breast: Secondary | ICD-10-CM | POA: Diagnosis not present

## 2016-01-23 LAB — CBC WITH DIFFERENTIAL/PLATELET
BASOS PCT: 0 %
Basophils Absolute: 0 10*3/uL (ref 0–0.1)
EOS ABS: 0.2 10*3/uL (ref 0–0.7)
EOS PCT: 3 %
HCT: 43.4 % (ref 35.0–47.0)
Hemoglobin: 15 g/dL (ref 12.0–16.0)
LYMPHS ABS: 2.5 10*3/uL (ref 1.0–3.6)
Lymphocytes Relative: 38 %
MCH: 31.4 pg (ref 26.0–34.0)
MCHC: 34.7 g/dL (ref 32.0–36.0)
MCV: 90.5 fL (ref 80.0–100.0)
MONOS PCT: 8 %
Monocytes Absolute: 0.5 10*3/uL (ref 0.2–0.9)
Neutro Abs: 3.4 10*3/uL (ref 1.4–6.5)
Neutrophils Relative %: 51 %
PLATELETS: 195 10*3/uL (ref 150–440)
RBC: 4.79 MIL/uL (ref 3.80–5.20)
RDW: 13.3 % (ref 11.5–14.5)
WBC: 6.6 10*3/uL (ref 3.6–11.0)

## 2016-01-23 LAB — COMPREHENSIVE METABOLIC PANEL
ALT: 75 U/L — ABNORMAL HIGH (ref 14–54)
ANION GAP: 7 (ref 5–15)
AST: 55 U/L — ABNORMAL HIGH (ref 15–41)
Albumin: 4 g/dL (ref 3.5–5.0)
Alkaline Phosphatase: 75 U/L (ref 38–126)
BUN: 21 mg/dL — ABNORMAL HIGH (ref 6–20)
CALCIUM: 9 mg/dL (ref 8.9–10.3)
CHLORIDE: 102 mmol/L (ref 101–111)
CO2: 27 mmol/L (ref 22–32)
Creatinine, Ser: 0.65 mg/dL (ref 0.44–1.00)
GFR calc non Af Amer: >60 mL/min (ref >60)
Glucose, Bld: 98 mg/dL (ref 65–99)
POTASSIUM: 4.4 mmol/L (ref 3.5–5.1)
SODIUM: 136 mmol/L (ref 135–145)
Total Bilirubin: 0.5 mg/dL (ref 0.3–1.2)
Total Protein: 7.3 g/dL (ref 6.5–8.1)

## 2016-01-23 LAB — MAGNESIUM: Magnesium: 1.9 mg/dL (ref 1.7–2.4)

## 2016-01-23 MED ORDER — VENLAFAXINE HCL ER 37.5 MG PO CP24: 37.5000 mg | ORAL_CAPSULE | Freq: Every day | ORAL | 3 refills | Status: DC

## 2016-01-23 NOTE — Progress Notes (Signed)
[place] OFFICE PROGRESS NOTE  Patient Care Team: Lavera Guise, MD as PCP - General (Internal Medicine) Robert Bellow, MD (General Surgery) Dion Body, MD as Referring Physician (Family Medicine) Forest Gleason, MD (Oncology)  Breast cancer of upper-outer quadrant of left female breast [facility])   Staging form: Breast, AJCC 7th Edition   - Clinical: Stage IA (T1c, N0, M0) - Unsigned   Oncology History   1.carcinoma of breast(left) T1 C. N0 M0 status post ultrasound-guided biopsy Estrogen receptor positive. Progesterone receptor positive.  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 chemotherapy on hold since November because of cellulitis in the chest wall. 4.started on Herceptin May 22, 2014.  Cause of recurrent chest wall  infection chemotherapy was put on hold. 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  Patient is on multiple antibiotic and as per infectious disease specialist similar to stay on antibiotic for 4 months. So chemotherapy has been discontinued. Maintenance Herceptin as well as patient was started on letrozole from June 12, 2014 6.  Port was removed because of Pseudomonas infection of the pocket (August, 2016) 7.  Patient is finishing up Herceptin in August of 2016 now on letrozole;   # FEB 2017- TAMOXIFEN      Breast cancer of upper-outer quadrant of left female breast ([place])   10/14/2013 Initial Diagnosis    Breast cancer of upper-outer quadrant of left female breast        This is my first interaction with the patient as patient's primary oncologist has been Dr.Choksi. I reviewed the patient's prior charts/pertinent labs/imaging in  detail; findings are summarized above.     INTERVAL HISTORY:  Danielle Wallace 55 y.o.  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  Patient complains of significant hot flashes; with cold sweats. Denies any bone pain or headaches. Denies any chest pain or shortness of breath or cough.  She does complain of abdominal distention. Denies any significant weight gain. She complains of skin rash between the abdominal folds.  REVIEW OF SYSTEMS:  A complete 10 point review of system is done which is negative except mentioned above/history of present illness.   PAST MEDICAL HISTORY :  Past Medical History:  Diagnosis Date  . Arthritis 2006  . Asthma   . Breast cancer ([place]) 10/14/2013   chemo, herception  . Cancer [facility]) 2006   Renal cell carcinoma; cryosurgery treatment  . Collapsed lung 2007  . Diabetes mellitus without complication ([place]) 5456   Metformin  . Diffuse cystic mastopathy   . Sinus problem     PAST SURGICAL HISTORY :   Past Surgical History:  Procedure Laterality Date  . BREAST BIOPSY Right 2005   negative  . BREAST BIOPSY Left 08/23/2007   negative  . BREAST BIOPSY Left 10/24/2013   positive  . BREAST BIOPSY Left 07/2002   neg  . BREAST BIOPSY Left 08/23/2007   neg  . BREAST SURGERY Left 10/2011   Cyst Aspirationapocrine metaplasia, ductal cells and bone cells, hypo-cellular  . BREAST SURGERY Left 2009   ADH on stereotactic biopsy, 1.5 mm focus.  Marland Kitchen BREAST SURGERY Left  2003   fibrocystic changes with ductal hyperplasia without atypia.  Marland Kitchen BREAST SURGERY Left    mastectomy  . BREAST SURGERY Left April 11, 2014   Removal of implant, debridement chest wall Dr.Coan  . CESAREAN SECTION  1991  . CHOLECYSTECTOMY  1990  . COLONOSCOPY  08/26/2013   Verdie Shire, M.D. normal.  . CRYOABLATION  2005, 2010  . CYST REMOVAL NECK  10/2011   Dr. Tami Ribas  . INCISION AND DRAINAGE Left  Dec 2015, Feb 2016   Dr Tula Nakayama  . MASTECTOMY Left 11/24/2013   positive  . PORTACATH PLACEMENT  11/24/13    FAMILY HISTORY :   Family History  Problem Relation Age of Onset  . Breast cancer Paternal Aunt 19  . Ovarian cancer Maternal Grandmother   . Liver cancer Father   . Diabetes Mother   . Hypertension Mother     SOCIAL HISTORY:   Social History  Substance Use Topics  . Smoking status: Never Smoker  . Smokeless tobacco: Never Used  . Alcohol use Yes     Comment: occasionally    ALLERGIES:  is allergic to exemestane; linezolid; taxotere [docetaxel]; floxin [ofloxacin]; hydrocodone; sulfa antibiotics; codeine; penicillins; and vancomycin.  MEDICATIONS:  Current Outpatient Prescriptions  Medication Sig Dispense Refill  . Calcium Carb-Cholecalciferol (CALCIUM + D3) 600-200 MG-UNIT TABS Take 1 tablet by mouth 2 (two) times daily. 180 tablet 3  . canagliflozin (INVOKANA) 300 MG TABS tablet TAKE 1 TABLET BY MOUTH EVERY DAY    . etodolac (LODINE) 400 MG tablet TAKE 1 TABLET BY MOUTH 3 TIMES A DAY AS NEEDED FOR PAIN    . gabapentin (NEURONTIN) 600 MG tablet Take 600 mg by mouth 3 (three) times daily.  2  . glipiZIDE (GLUCOTROL XL) 5 MG 24 hr tablet Take 5 mg by mouth daily.    Marland Kitchen imipramine (TOFRANIL) 25 MG tablet Take 25 mg by mouth at bedtime.    Marland Kitchen levocetirizine (XYZAL) 5 MG tablet Take 5 mg by mouth every evening.    . metFORMIN (GLUCOPHAGE) 500 MG tablet Take 2,000 mg by mouth every evening.     . Probiotic Product (PROBIOTIC DAILY PO) Take by mouth.    . tamoxifen (NOLVADEX) 20 MG tablet Take 1 tablet (20 mg total) by mouth daily. 30 tablet 6  . valsartan-hydrochlorothiazide (DIOVAN-HCT) 80-12.5 MG per tablet Take 1 tablet by mouth daily.    Marland Kitchen venlafaxine XR (EFFEXOR-XR) 37.5 MG 24 hr capsule Take 1 capsule (37.5 mg total) by mouth daily with breakfast. 30 capsule 3   No current facility-administered medications for this visit.     PHYSICAL EXAMINATION: ECOG PERFORMANCE  STATUS: 0 - Asymptomatic  BP 139/85 (BP Location: Right Leg, Patient Position: Sitting)   Pulse 90   Temp 98.2 F (36.8 C) (Tympanic)   Resp 20   Ht 5' 5" (1.651 m)   Wt 208 lb (94.3 kg)   BMI 34.61 kg/m   Filed Weights   01/23/16 1119  Weight: 208 lb (94.3 kg)    GENERAL: Well-nourished well-developed; Alert, no distress and comfortable.   Alone.  EYES: no pallor or icterus OROPHARYNX: no thrush or ulceration; good dentition  NECK: supple, no masses felt LYMPH:  no palpable lymphadenopathy in the cervical, axillary or inguinal regions LUNGS: clear to auscultation and  No wheeze or crackles HEART/CVS: regular rate & rhythm and no murmurs; No lower extremity edema ABDOMEN:abdomen soft, non-tender and normal bowel sounds; Mild abdominal distention noted. Musculoskeletal:no cyanosis of digits and no  clubbing  PSYCH: alert & oriented x 3 with fluent speech NEURO: no focal motor/sensory deficits SKIN: Erythematous rash lower abdominal fold Right and left BREAST exam [in the presence of nurse]- no unusual skin changes or dominant masses felt. Surgical scars noted. Left mastectomy noted   LABORATORY DATA:  I have reviewed the data as listed    Component Value Date/Time   NA 136 01/23/2016 1106   NA 138 09/04/2014 1358   K 4.4 01/23/2016 1106   K 4.0 09/04/2014 1358   CL 102 01/23/2016 1106   CL 99 (L) 09/04/2014 1358   CO2 27 01/23/2016 1106   CO2 31 09/04/2014 1358   GLUCOSE 98 01/23/2016 1106   GLUCOSE 136 (H) 09/04/2014 1358   BUN 21 (H) 01/23/2016 1106   BUN 18 09/04/2014 1358   CREATININE 0.65 01/23/2016 1106   CREATININE 0.53 09/04/2014 1358   CALCIUM 9.0 01/23/2016 1106   CALCIUM 9.7 09/04/2014 1358   PROT 7.3 01/23/2016 1106   PROT 7.2 11/01/2015 1640   PROT 7.8 09/04/2014 1358   ALBUMIN 4.0 01/23/2016 1106   ALBUMIN 4.2 11/01/2015 1640   ALBUMIN 4.1 09/04/2014 1358   AST 55 (H) 01/23/2016 1106   AST 32 09/04/2014 1358   ALT 75 (H) 01/23/2016 1106   ALT  33 09/04/2014 1358   ALKPHOS 75 01/23/2016 1106   ALKPHOS 73 09/04/2014 1358   BILITOT 0.5 01/23/2016 1106   BILITOT 0.3 11/01/2015 1640   BILITOT 0.6 09/04/2014 1358   GFRNONAA >60 01/23/2016 1106   GFRNONAA >60 09/04/2014 1358   GFRAA >60 01/23/2016 1106   GFRAA >60 09/04/2014 1358    No results found for: SPEP, UPEP  Lab Results  Component Value Date   WBC 6.6 01/23/2016   NEUTROABS 3.4 01/23/2016   HGB 15.0 01/23/2016   HCT 43.4 01/23/2016   MCV 90.5 01/23/2016   PLT 195 01/23/2016      Chemistry      Component Value Date/Time   NA 136 01/23/2016 1106   NA 138 09/04/2014 1358   K 4.4 01/23/2016 1106   K 4.0 09/04/2014 1358   CL 102 01/23/2016 1106   CL 99 (L) 09/04/2014 1358   CO2 27 01/23/2016 1106   CO2 31 09/04/2014 1358   BUN 21 (H) 01/23/2016 1106   BUN 18 09/04/2014 1358   CREATININE 0.65 01/23/2016 1106   CREATININE 0.53 09/04/2014 1358      Component Value Date/Time   CALCIUM 9.0 01/23/2016 1106   CALCIUM 9.7 09/04/2014 1358   ALKPHOS 75 01/23/2016 1106   ALKPHOS 73 09/04/2014 1358   AST 55 (H) 01/23/2016 1106   AST 32 09/04/2014 1358   ALT 75 (H) 01/23/2016 1106   ALT 33 09/04/2014 1358   BILITOT 0.5 01/23/2016 1106   BILITOT 0.3 11/01/2015 1640   BILITOT 0.6 09/04/2014 1358       RADIOGRAPHIC STUDIES: I have personally reviewed the radiological images as listed and agreed with the findings in the report. No results found.   ASSESSMENT & PLAN:  Breast cancer of upper-outer quadrant of left female breast ([place]) Stage I Breast cancer ER/PR/her 2 neu POS-  currently on tamoxifen. Tolerating well except for hot flashes discussed below. Clinically no evidence of recurrence.  # hot flashes/ vasomotor symptoms- recommend effexor.   # Abdominal distention- question ascites. check [place]  # follow up in 76month/labs; sooner if [place] abnormal.   # Abdominal fold skin rash-fungal infection recommend topical clotrimazole powder.  #  25 minutes  face-to-face with the patient discussing the above plan of care; more than 50% of time spent on prognosis/ natural history; counseling and coordination.      Orders Placed This Encounter  Procedures  . US Abdomen Complete    Standing Status:   Future    Standing Expiration Date:   01/22/2017    Order Specific Question:   Reason for Exam (SYMPTOM  OR DIAGNOSIS REQUIRED)    Answer:   abdomial distention    Order Specific Question:   Preferred imaging location?    Answer:   [facility]  . CBC with Differential    Standing Status:   Future    Standing Expiration Date:   01/22/2017  . Comprehensive metabolic panel    Standing Status:   Future    Standing Expiration Date:   01/22/2017  . Cancer antigen 27.29    Standing Status:   Future    Standing Expiration Date:   01/22/2017   All questions were answered. The patient knows to call the clinic with any problems, questions or concerns.      Cammie Sickle, MD 01/23/2016 1:29 PM

## 2016-01-23 NOTE — Progress Notes (Signed)
RN Chaperoned provider with Breast Exam.   

## 2016-01-23 NOTE — Assessment & Plan Note (Addendum)
Stage I Breast cancer ER/PR/her 2 neu POS-  currently on tamoxifen. Tolerating well except for hot flashes discussed below. Clinically no evidence of recurrence.  # hot flashes/ vasomotor symptoms- recommend effexor.   # Abdominal distention- question ascites. check [place]  # follow up in 62month/labs; sooner if [place] abnormal.   # Abdominal fold skin rash-fungal infection recommend topical clotrimazole powder.  # 25 minutes face-to-face with the patient discussing the above plan of care; more than 50% of time spent on prognosis/ natural history; counseling and coordination.

## 2016-01-23 NOTE — Progress Notes (Signed)
Pt c/o 'yeast-infection under abd. Fold." "I was at the beach last week and I think this increased my risk for this yeast infection." "I have not been able to get my linzess from my pcp. I still have constipation." pt not taking otc interventions for constipation. Encouraged patient to use otc stool softners/miralax/prune juice/increase po fluid intake/fiber diet.

## 2016-01-24 ENCOUNTER — Other Ambulatory Visit: Payer: Self-pay | Admitting: Internal Medicine

## 2016-01-24 DIAGNOSIS — R7989 Other specified abnormal findings of blood chemistry: Secondary | ICD-10-CM

## 2016-01-24 DIAGNOSIS — R140 Abdominal distension (gaseous): Secondary | ICD-10-CM

## 2016-01-24 DIAGNOSIS — M25572 Pain in left ankle and joints of left foot: Secondary | ICD-10-CM | POA: Diagnosis not present

## 2016-01-24 DIAGNOSIS — M7662 Achilles tendinitis, left leg: Secondary | ICD-10-CM | POA: Diagnosis not present

## 2016-01-24 DIAGNOSIS — R635 Abnormal weight gain: Secondary | ICD-10-CM

## 2016-01-24 DIAGNOSIS — R945 Abnormal results of liver function studies: Secondary | ICD-10-CM

## 2016-01-24 DIAGNOSIS — C50412 Malignant neoplasm of upper-outer quadrant of left female breast: Secondary | ICD-10-CM

## 2016-01-25 ENCOUNTER — Ambulatory Visit
Admission: RE | Admit: 2016-01-25 | Discharge: 2016-01-25 | Disposition: A | Discharge status: Home / Self Care | Payer: BLUE CROSS/BLUE SHIELD | Source: Ambulatory Visit | Attending: Internal Medicine | Admitting: Internal Medicine

## 2016-01-25 DIAGNOSIS — R945 Abnormal results of liver function studies: Secondary | ICD-10-CM

## 2016-01-25 DIAGNOSIS — R140 Abdominal distension (gaseous): Secondary | ICD-10-CM | POA: Diagnosis not present

## 2016-01-25 DIAGNOSIS — R635 Abnormal weight gain: Secondary | ICD-10-CM | POA: Diagnosis not present

## 2016-01-25 DIAGNOSIS — K760 Fatty (change of) liver, not elsewhere classified: Secondary | ICD-10-CM | POA: Diagnosis not present

## 2016-01-25 DIAGNOSIS — R7989 Other specified abnormal findings of blood chemistry: Secondary | ICD-10-CM | POA: Diagnosis not present

## 2016-01-25 DIAGNOSIS — R93421 Abnormal radiologic findings on diagnostic imaging of right kidney: Secondary | ICD-10-CM | POA: Insufficient documentation

## 2016-01-25 DIAGNOSIS — C50412 Malignant neoplasm of upper-outer quadrant of left female breast: Secondary | ICD-10-CM | POA: Diagnosis not present

## 2016-01-28 ENCOUNTER — Telehealth: Payer: Self-pay | Admitting: *Deleted

## 2016-01-28 DIAGNOSIS — M7662 Achilles tendinitis, left leg: Secondary | ICD-10-CM | POA: Diagnosis not present

## 2016-01-28 DIAGNOSIS — M25572 Pain in left ankle and joints of left foot: Secondary | ICD-10-CM | POA: Diagnosis not present

## 2016-01-28 NOTE — Telephone Encounter (Signed)
Patient returned phone call from cancer center back.  I personally spoke with patient. Results of recently u/s provided to patient.  Explained that her U/s negative for any acute problems.  She was instructed to keep her upcoming apts in December. Teach back process performed with patient.

## 2016-01-28 NOTE — Telephone Encounter (Signed)
-----   Message from Cammie Sickle, MD sent at 01/25/2016  4:50 PM EDT ----- Please inform patient that ultrasound negative acute problems. Follow-up as planned.

## 2016-01-28 NOTE — Telephone Encounter (Signed)
Left msg asked pt to contact our office for test results

## 2016-01-29 DIAGNOSIS — E119 Type 2 diabetes mellitus without complications: Secondary | ICD-10-CM | POA: Diagnosis not present

## 2016-02-04 ENCOUNTER — Other Ambulatory Visit: Payer: Self-pay | Admitting: *Deleted

## 2016-02-04 DIAGNOSIS — C50412 Malignant neoplasm of upper-outer quadrant of left female breast: Secondary | ICD-10-CM

## 2016-02-04 MED ORDER — TAMOXIFEN CITRATE 20 MG PO TABS: 20.0000 mg | ORAL_TABLET | Freq: Every day | ORAL | 1 refills | Status: DC

## 2016-02-06 ENCOUNTER — Other Ambulatory Visit: Payer: Self-pay | Admitting: *Deleted

## 2016-02-06 DIAGNOSIS — C50412 Malignant neoplasm of upper-outer quadrant of left female breast: Secondary | ICD-10-CM

## 2016-02-06 MED ORDER — TAMOXIFEN CITRATE 20 MG PO TABS: 20.0000 mg | ORAL_TABLET | Freq: Every day | ORAL | 0 refills | Status: DC

## 2016-02-08 ENCOUNTER — Other Ambulatory Visit: Payer: Self-pay | Admitting: *Deleted

## 2016-02-08 MED ORDER — VENLAFAXINE HCL ER 37.5 MG PO CP24: 37.5000 mg | ORAL_CAPSULE | Freq: Every day | ORAL | 0 refills | Status: DC

## 2016-02-11 DIAGNOSIS — M7662 Achilles tendinitis, left leg: Secondary | ICD-10-CM | POA: Diagnosis not present

## 2016-02-11 DIAGNOSIS — M25572 Pain in left ankle and joints of left foot: Secondary | ICD-10-CM | POA: Diagnosis not present

## 2016-02-13 DIAGNOSIS — M7662 Achilles tendinitis, left leg: Secondary | ICD-10-CM | POA: Diagnosis not present

## 2016-02-13 DIAGNOSIS — M25572 Pain in left ankle and joints of left foot: Secondary | ICD-10-CM | POA: Diagnosis not present

## 2016-02-18 DIAGNOSIS — M25572 Pain in left ankle and joints of left foot: Secondary | ICD-10-CM | POA: Diagnosis not present

## 2016-02-18 DIAGNOSIS — M7662 Achilles tendinitis, left leg: Secondary | ICD-10-CM | POA: Diagnosis not present

## 2016-02-20 DIAGNOSIS — M25572 Pain in left ankle and joints of left foot: Secondary | ICD-10-CM | POA: Diagnosis not present

## 2016-02-20 DIAGNOSIS — M7662 Achilles tendinitis, left leg: Secondary | ICD-10-CM | POA: Diagnosis not present

## 2016-03-04 DIAGNOSIS — M7662 Achilles tendinitis, left leg: Secondary | ICD-10-CM | POA: Diagnosis not present

## 2016-03-04 DIAGNOSIS — M25572 Pain in left ankle and joints of left foot: Secondary | ICD-10-CM | POA: Diagnosis not present

## 2016-03-06 DIAGNOSIS — M7662 Achilles tendinitis, left leg: Secondary | ICD-10-CM | POA: Diagnosis not present

## 2016-03-06 DIAGNOSIS — M25572 Pain in left ankle and joints of left foot: Secondary | ICD-10-CM | POA: Diagnosis not present

## 2016-04-23 ENCOUNTER — Other Ambulatory Visit: Payer: BLUE CROSS/BLUE SHIELD

## 2016-04-23 ENCOUNTER — Ambulatory Visit: Payer: BLUE CROSS/BLUE SHIELD | Admitting: Internal Medicine

## 2016-04-25 ENCOUNTER — Inpatient Hospital Stay: Payer: BLUE CROSS/BLUE SHIELD | Attending: Internal Medicine

## 2016-04-25 ENCOUNTER — Inpatient Hospital Stay (HOSPITAL_BASED_OUTPATIENT_CLINIC_OR_DEPARTMENT_OTHER): Payer: BLUE CROSS/BLUE SHIELD | Admitting: Internal Medicine

## 2016-04-25 VITALS — BP 123/70 | HR 70 | Temp 97.5°F | Resp 20

## 2016-04-25 DIAGNOSIS — K760 Fatty (change of) liver, not elsewhere classified: Secondary | ICD-10-CM

## 2016-04-25 DIAGNOSIS — Z79810 Long term (current) use of selective estrogen receptor modulators (SERMs): Secondary | ICD-10-CM

## 2016-04-25 DIAGNOSIS — E119 Type 2 diabetes mellitus without complications: Secondary | ICD-10-CM

## 2016-04-25 DIAGNOSIS — Z880 Allergy status to penicillin: Secondary | ICD-10-CM | POA: Diagnosis not present

## 2016-04-25 DIAGNOSIS — R232 Flushing: Secondary | ICD-10-CM | POA: Insufficient documentation

## 2016-04-25 DIAGNOSIS — Z9221 Personal history of antineoplastic chemotherapy: Secondary | ICD-10-CM | POA: Diagnosis not present

## 2016-04-25 DIAGNOSIS — C50412 Malignant neoplasm of upper-outer quadrant of left female breast: Secondary | ICD-10-CM

## 2016-04-25 DIAGNOSIS — Z79899 Other long term (current) drug therapy: Secondary | ICD-10-CM | POA: Diagnosis not present

## 2016-04-25 DIAGNOSIS — Z803 Family history of malignant neoplasm of breast: Secondary | ICD-10-CM

## 2016-04-25 DIAGNOSIS — R21 Rash and other nonspecific skin eruption: Secondary | ICD-10-CM | POA: Insufficient documentation

## 2016-04-25 DIAGNOSIS — Z9882 Breast implant status: Secondary | ICD-10-CM | POA: Insufficient documentation

## 2016-04-25 DIAGNOSIS — Z7984 Long term (current) use of oral hypoglycemic drugs: Secondary | ICD-10-CM

## 2016-04-25 DIAGNOSIS — Z8041 Family history of malignant neoplasm of ovary: Secondary | ICD-10-CM | POA: Diagnosis not present

## 2016-04-25 DIAGNOSIS — Z170 Estrogen receptor positive status [ER+]: Secondary | ICD-10-CM | POA: Insufficient documentation

## 2016-04-25 DIAGNOSIS — Z9012 Acquired absence of left breast and nipple: Secondary | ICD-10-CM | POA: Diagnosis not present

## 2016-04-25 DIAGNOSIS — Z881 Allergy status to other antibiotic agents status: Secondary | ICD-10-CM | POA: Diagnosis not present

## 2016-04-25 LAB — COMPREHENSIVE METABOLIC PANEL
ALK PHOS: 75 U/L (ref 38–126)
ALT: 68 U/L — AB (ref 14–54)
ANION GAP: 8 (ref 5–15)
AST: 58 U/L — ABNORMAL HIGH (ref 15–41)
Albumin: 3.9 g/dL (ref 3.5–5.0)
BILIRUBIN TOTAL: 0.6 mg/dL (ref 0.3–1.2)
BUN: 22 mg/dL — ABNORMAL HIGH (ref 6–20)
CALCIUM: 9.1 mg/dL (ref 8.9–10.3)
CO2: 27 mmol/L (ref 22–32)
CREATININE: 0.58 mg/dL (ref 0.44–1.00)
Chloride: 102 mmol/L (ref 101–111)
Glucose, Bld: 148 mg/dL — ABNORMAL HIGH (ref 65–99)
Potassium: 4.1 mmol/L (ref 3.5–5.1)
SODIUM: 137 mmol/L (ref 135–145)
TOTAL PROTEIN: 7 g/dL (ref 6.5–8.1)

## 2016-04-25 LAB — CBC WITH DIFFERENTIAL/PLATELET
Basophils Absolute: 0 10*3/uL (ref 0–0.1)
Basophils Relative: 1 %
EOS ABS: 0.2 10*3/uL (ref 0–0.7)
Eosinophils Relative: 3 %
HCT: 44 % (ref 35.0–47.0)
HEMOGLOBIN: 14.9 g/dL (ref 12.0–16.0)
LYMPHS ABS: 2.4 10*3/uL (ref 1.0–3.6)
LYMPHS PCT: 38 %
MCH: 30.6 pg (ref 26.0–34.0)
MCHC: 33.8 g/dL (ref 32.0–36.0)
MCV: 90.7 fL (ref 80.0–100.0)
MONOS PCT: 7 %
Monocytes Absolute: 0.5 10*3/uL (ref 0.2–0.9)
NEUTROS PCT: 51 %
Neutro Abs: 3.4 10*3/uL (ref 1.4–6.5)
Platelets: 194 10*3/uL (ref 150–440)
RBC: 4.85 MIL/uL (ref 3.80–5.20)
RDW: 12.8 % (ref 11.5–14.5)
WBC: 6.5 10*3/uL (ref 3.6–11.0)

## 2016-04-25 MED ORDER — VENLAFAXINE HCL ER 37.5 MG PO CP24: 37.5000 mg | ORAL_CAPSULE | Freq: Every day | ORAL | 3 refills | Status: DC

## 2016-04-25 NOTE — Progress Notes (Signed)
[place] OFFICE PROGRESS NOTE  Patient Care Team: Lavera Guise, MD as PCP - General (Internal Medicine) Robert Bellow, MD (General Surgery) Dion Body, MD as Referring Physician (Family Medicine) Forest Gleason, MD (Oncology)  Breast cancer of upper-outer quadrant of left female breast [facility])   Staging form: Breast, AJCC 7th Edition   - Clinical: Stage IA (T1c, N0, M0) - Unsigned   Oncology History   # July 2016- 1.carcinoma of breast(left) T1 C. N0 M0 status post ultrasound-guided biopsy Estrogen receptor positive. Progesterone receptor positive.  HER-2/neu amplified by FISH (3.8)  status pos  tnipple sparing mastectomy on the left side with reconstruction (July, 2016) Final staging is  yP1C yNOsn M0  stage 1 c  2.MRSA infection associated with left breast implant (August, 2015) 3.patient started chemotherapy with [facility] from 11th August, 2015. chemotherapy on hold since November because of cellulitis in the chest wall. 4.started on Herceptin May 22, 2014.  Cause of recurrent chest wall  infection chemotherapy was put on hold. 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  Patient is on multiple antibiotic and as per infectious disease specialist similar to stay on antibiotic for 4 months. So chemotherapy has been discontinued. Maintenance Herceptin as well as patient was started on letrozole from June 12, 2014 6.  Port was removed because of Pseudomonas infection of the pocket (August, 2016) 7.  Patient is finishing up Herceptin in August of 2016 now on letrozole;   # FEB 2017- TAMOXIFEN    # Hemochromatosis FHx     Breast cancer of upper-outer quadrant of left female breast ([place])   10/14/2013 Initial Diagnosis    Breast cancer of upper-outer quadrant of left female breast       Carcinoma of upper-outer quadrant of left breast in female, estrogen receptor positive ([place])   10/14/2013 Initial Diagnosis   Carcinoma of upper-outer quadrant of left breast in female, estrogen receptor positive ([place])          INTERVAL HISTORY:  Danielle Wallace 55 y.o.  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  Patient has noted significant improvement of her hot flashes after starting Effexor. Denies any new lumps or bumps. Denies any bone pain or headaches. Denies any chest pain or shortness of breath or cough.   She stated that she had recently had blood work- that showed elevated "iron studies". She is concerned about hemachromatosis; given diagnosis multiple family members. Her daughter is a Government social research officer.    REVIEW OF SYSTEMS:  A complete 10 point review of system is done which is negative except mentioned above/history of present illness.   PAST MEDICAL HISTORY :  Past Medical History:  Diagnosis Date  . Arthritis 2006  . Asthma   . Breast cancer ([place]) 10/14/2013   chemo, herception  . Cancer [facility]) 2006   Renal cell carcinoma; cryosurgery treatment  . Collapsed lung 2007  . Diabetes mellitus without complication ([place]) 4034   Metformin  . Diffuse cystic mastopathy   . Sinus problem     PAST SURGICAL HISTORY :   Past Surgical History:  Procedure Laterality Date  . BREAST BIOPSY Right 2005   negative  . BREAST BIOPSY Left 08/23/2007   negative  . BREAST BIOPSY Left 10/24/2013   positive  . BREAST BIOPSY Left 07/2002   neg  . BREAST BIOPSY Left 08/23/2007  neg  . BREAST SURGERY Left 10/2011   Cyst Aspirationapocrine metaplasia, ductal cells and bone cells, hypo-cellular  . BREAST SURGERY Left 2009   ADH on stereotactic biopsy, 1.5 mm focus.  Marland Kitchen BREAST SURGERY Left 2003   fibrocystic changes with ductal hyperplasia without atypia.  Marland Kitchen BREAST SURGERY Left    mastectomy  . BREAST SURGERY Left April 11, 2014   Removal of implant, debridement chest wall Dr.Coan  . CESAREAN  SECTION  1991  . CHOLECYSTECTOMY  1990  . COLONOSCOPY  08/26/2013   Verdie Shire, M.D. normal.  . CRYOABLATION  2005, 2010  . CYST REMOVAL NECK  10/2011   Dr. Tami Ribas  . INCISION AND DRAINAGE Left Dec 2015, Feb 2016   Dr Tula Nakayama  . MASTECTOMY Left 11/24/2013   positive  . PORTACATH PLACEMENT  11/24/13    FAMILY HISTORY :   Family History  Problem Relation Age of Onset  . Breast cancer Paternal Aunt 27  . Ovarian cancer Maternal Grandmother   . Liver cancer Father   . Diabetes Mother   . Hypertension Mother     SOCIAL HISTORY:   Social History  Substance Use Topics  . Smoking status: Never Smoker  . Smokeless tobacco: Never Used  . Alcohol use Yes     Comment: occasionally    ALLERGIES:  is allergic to exemestane; linezolid; taxotere [docetaxel]; floxin [ofloxacin]; hydrocodone; sulfa antibiotics; codeine; penicillins; and vancomycin.  MEDICATIONS:  Current Outpatient Prescriptions  Medication Sig Dispense Refill  . Calcium Carb-Cholecalciferol (CALCIUM + D3) 600-200 MG-UNIT TABS Take 1 tablet by mouth 2 (two) times daily. 180 tablet 3  . canagliflozin (INVOKANA) 300 MG TABS tablet TAKE 1 TABLET BY MOUTH EVERY DAY    . etodolac (LODINE) 400 MG tablet TAKE 1 TABLET BY MOUTH 3 TIMES A DAY AS NEEDED FOR PAIN    . gabapentin (NEURONTIN) 600 MG tablet Take 600 mg by mouth 3 (three) times daily.  2  . glipiZIDE (GLUCOTROL XL) 5 MG 24 hr tablet Take 5 mg by mouth daily.    Marland Kitchen imipramine (TOFRANIL) 25 MG tablet Take 25 mg by mouth at bedtime.    Marland Kitchen levocetirizine (XYZAL) 5 MG tablet Take 5 mg by mouth every evening.    . linaclotide (LINZESS) 145 MCG CAPS capsule Take 145 mcg by mouth daily before breakfast.    . metFORMIN (GLUCOPHAGE) 500 MG tablet Take 2,000 mg by mouth every evening.     . tamoxifen (NOLVADEX) 20 MG tablet Take 1 tablet (20 mg total) by mouth daily. 90 tablet 0  . valsartan-hydrochlorothiazide (DIOVAN-HCT) 80-12.5 MG per tablet Take 1 tablet by mouth daily.    Marland Kitchen  venlafaxine XR (EFFEXOR-XR) 37.5 MG 24 hr capsule Take 1 capsule (37.5 mg total) by mouth daily with breakfast. 90 capsule 3   No current facility-administered medications for this visit.     PHYSICAL EXAMINATION: ECOG PERFORMANCE STATUS: 0 - Asymptomatic  BP 123/70   Pulse 70   Temp 97.5 F (36.4 C) (Tympanic)   Resp 20   There were no vitals filed for this visit.  GENERAL: Well-nourished well-developed; Alert, no distress and comfortable.   Alone.  EYES: no pallor or icterus OROPHARYNX: no thrush or ulceration; good dentition  NECK: supple, no masses felt LYMPH:  no palpable lymphadenopathy in the cervical, axillary or inguinal regions LUNGS: clear to auscultation and  No wheeze or crackles HEART/CVS: regular rate & rhythm and no murmurs; No lower extremity edema ABDOMEN:abdomen soft, non-tender and normal  bowel sounds; Musculoskeletal:no cyanosis of digits and no clubbing  PSYCH: alert & oriented x 3 with fluent speech NEURO: no focal motor/sensory deficits SKIN: Erythematous rash lower abdominal fold    LABORATORY DATA:  I have reviewed the data as listed    Component Value Date/Time   NA 137 04/25/2016 1015   NA 138 09/04/2014 1358   K 4.1 04/25/2016 1015   K 4.0 09/04/2014 1358   CL 102 04/25/2016 1015   CL 99 (L) 09/04/2014 1358   CO2 27 04/25/2016 1015   CO2 31 09/04/2014 1358   GLUCOSE 148 (H) 04/25/2016 1015   GLUCOSE 136 (H) 09/04/2014 1358   BUN 22 (H) 04/25/2016 1015   BUN 18 09/04/2014 1358   CREATININE 0.58 04/25/2016 1015   CREATININE 0.53 09/04/2014 1358   CALCIUM 9.1 04/25/2016 1015   CALCIUM 9.7 09/04/2014 1358   PROT 7.0 04/25/2016 1015   PROT 7.2 11/01/2015 1640   PROT 7.8 09/04/2014 1358   ALBUMIN 3.9 04/25/2016 1015   ALBUMIN 4.2 11/01/2015 1640   ALBUMIN 4.1 09/04/2014 1358   AST 58 (H) 04/25/2016 1015   AST 32 09/04/2014 1358   ALT 68 (H) 04/25/2016 1015   ALT 33 09/04/2014 1358   ALKPHOS 75 04/25/2016 1015   ALKPHOS 73 09/04/2014  1358   BILITOT 0.6 04/25/2016 1015   BILITOT 0.3 11/01/2015 1640   BILITOT 0.6 09/04/2014 1358   GFRNONAA >60 04/25/2016 1015   GFRNONAA >60 09/04/2014 1358   GFRAA >60 04/25/2016 1015   GFRAA >60 09/04/2014 1358    No results found for: SPEP, UPEP  Lab Results  Component Value Date   WBC 6.5 04/25/2016   NEUTROABS 3.4 04/25/2016   HGB 14.9 04/25/2016   HCT 44.0 04/25/2016   MCV 90.7 04/25/2016   PLT 194 04/25/2016      Chemistry      Component Value Date/Time   NA 137 04/25/2016 1015   NA 138 09/04/2014 1358   K 4.1 04/25/2016 1015   K 4.0 09/04/2014 1358   CL 102 04/25/2016 1015   CL 99 (L) 09/04/2014 1358   CO2 27 04/25/2016 1015   CO2 31 09/04/2014 1358   BUN 22 (H) 04/25/2016 1015   BUN 18 09/04/2014 1358   CREATININE 0.58 04/25/2016 1015   CREATININE 0.53 09/04/2014 1358      Component Value Date/Time   CALCIUM 9.1 04/25/2016 1015   CALCIUM 9.7 09/04/2014 1358   ALKPHOS 75 04/25/2016 1015   ALKPHOS 73 09/04/2014 1358   AST 58 (H) 04/25/2016 1015   AST 32 09/04/2014 1358   ALT 68 (H) 04/25/2016 1015   ALT 33 09/04/2014 1358   BILITOT 0.6 04/25/2016 1015   BILITOT 0.3 11/01/2015 1640   BILITOT 0.6 09/04/2014 1358       RADIOGRAPHIC STUDIES: I have personally reviewed the radiological images as listed and agreed with the findings in the report. No results found.   ASSESSMENT & PLAN:  Carcinoma of upper-outer quadrant of left breast in female, estrogen receptor positive ([place]) Stage I Breast cancer ER/PR/her 2 neu POS-  currently on tamoxifen. Tolerating well. Clinically no evidence of recurrence. Mammogram in June 2017 negative  # hot flashes/ vasomotor symptoms- significantly helped- effexor.   # ? Elevated Ferritin- [family hx of hemochromatosis]- Await fax re: iron saturations-   # BMD- Normal Feb 2017; continue calcium and vitamin D.  # Fatty liver/ mildly elevated LFTs discussed small risk of progression to cirrhosis over time. Recommend  weight loss/eating healthy/exercise. Also discussed that this might also help her cut down the risk of her breast cancer recurrence  # follow up in 6 months/labs/ iron studies-ferritin.   # 25 minutes face-to-face with the patient discussing the above plan of care; more than 50% of time spent on prognosis/ natural history; counseling and coordination.   Orders Placed This Encounter  Procedures  . CBC with Differential/Platelet    Standing Status:   Future    Standing Expiration Date:   04/25/2017  . Comprehensive metabolic panel    Standing Status:   Future    Standing Expiration Date:   04/25/2017  . Iron and TIBC    Standing Status:   Future    Standing Expiration Date:   04/25/2017  . Ferritin    Standing Status:   Future    Standing Expiration Date:   04/25/2017   All questions were answered. The patient knows to call the clinic with any problems, questions or concerns.      Cammie Sickle, MD 04/27/2016 11:47 AM

## 2016-04-25 NOTE — Assessment & Plan Note (Signed)
Stage I Breast cancer ER/PR/her 2 neu POS-  currently on tamoxifen. Tolerating well. Clinically no evidence of recurrence. Mammogram in June 2017 negative  # hot flashes/ vasomotor symptoms- significantly helped- effexor.   # ? Elevated Ferritin- [family hx of hemochromatosis]- Await fax re: iron saturations-   # BMD- Normal Feb 2017; continue calcium and vitamin D.  # Fatty liver/ mildly elevated LFTs discussed small risk of progression to cirrhosis over time. Recommend weight loss/eating healthy/exercise. Also discussed that this might also help her cut down the risk of her breast cancer recurrence  # follow up in 6 months/labs/ iron studies-ferritin.

## 2016-04-25 NOTE — Progress Notes (Signed)
Patient had a ferritin drawn on 287.   Family h/o hemochromatosis. States that daughter receives therapeutic phlebotomies. Pt was told by her pcp that she personally does not have hemochromatosis trait, but she would like to discuss this with Dr. Rogue Bussing.  Pt states that her hot flashes have improved with the use of Effexor.

## 2016-04-26 LAB — CANCER ANTIGEN 27.29: CA 27.29: 28.3 U/mL (ref 0.0–38.6)

## 2016-05-04 ENCOUNTER — Other Ambulatory Visit: Payer: Self-pay | Admitting: Internal Medicine

## 2016-05-04 DIAGNOSIS — C50412 Malignant neoplasm of upper-outer quadrant of left female breast: Secondary | ICD-10-CM

## 2016-05-08 ENCOUNTER — Encounter: Payer: Self-pay | Admitting: Internal Medicine

## 2016-05-23 DIAGNOSIS — E119 Type 2 diabetes mellitus without complications: Secondary | ICD-10-CM | POA: Diagnosis not present

## 2016-05-23 DIAGNOSIS — E089 Diabetes mellitus due to underlying condition without complications: Secondary | ICD-10-CM | POA: Diagnosis not present

## 2016-05-23 DIAGNOSIS — H25042 Posterior subcapsular polar age-related cataract, left eye: Secondary | ICD-10-CM | POA: Diagnosis not present

## 2016-07-17 DIAGNOSIS — E119 Type 2 diabetes mellitus without complications: Secondary | ICD-10-CM | POA: Diagnosis not present

## 2016-07-17 DIAGNOSIS — I10 Essential (primary) hypertension: Secondary | ICD-10-CM | POA: Diagnosis not present

## 2016-07-17 DIAGNOSIS — R252 Cramp and spasm: Secondary | ICD-10-CM | POA: Diagnosis not present

## 2016-07-17 DIAGNOSIS — D751 Secondary polycythemia: Secondary | ICD-10-CM | POA: Diagnosis not present

## 2016-07-24 IMAGING — MR MR HEAD W/O CM
10 series · 48 of 48 positions shown · non-contrast
Comparison: CT head 06/30/2006

CLINICAL DATA: Syncope and dizziness.

EXAM:
MRI HEAD WITHOUT CONTRAST
TECHNIQUE: Multiplanar, multiecho pulse sequences of the brain and surrounding
structures were obtained without intravenous contrast.

[Series 2: T1 · sagittal · 5.0mm · 0.45mm/px · 3 of 23 slices shown (1 of 2)]
[im 1/23]
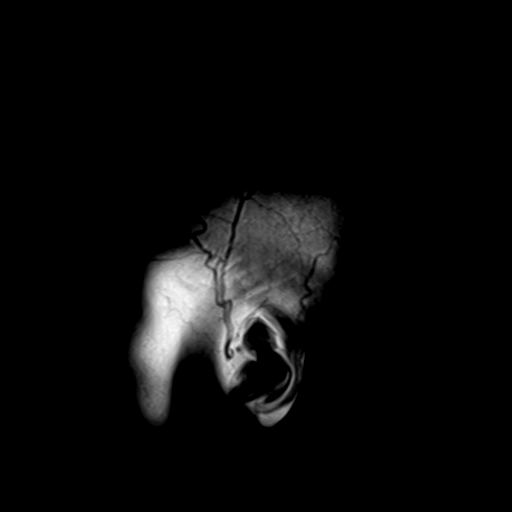
[im 12/23]
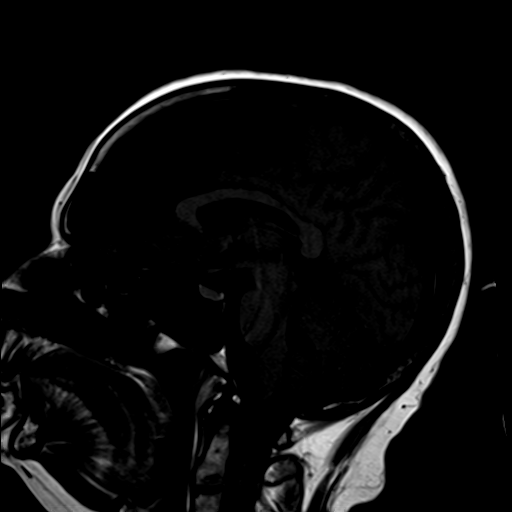
[im 23/23]
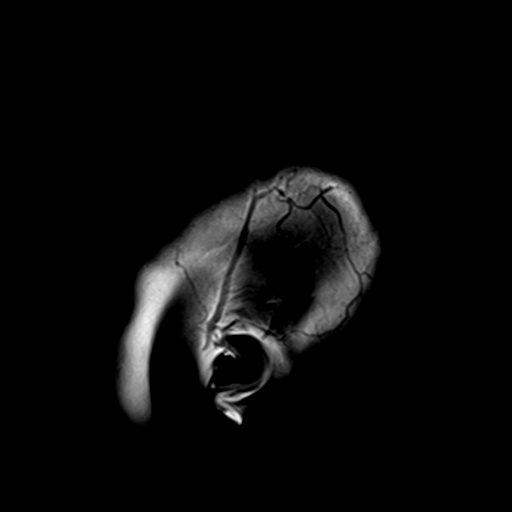

[Series 4: DWI · axial · 3.0mm · 1.20mm/px · z∈[-64,+93]mm · 7 of 54 slices shown (1 of 4)]
[im 1/54]
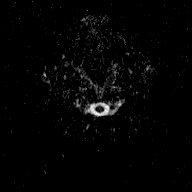
[im 9/54]
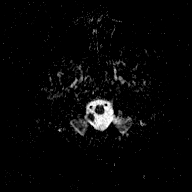
[im 18/54]
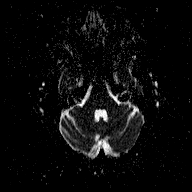
[im 27/54]
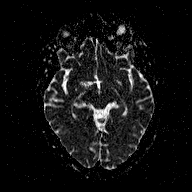
[im 36/54]
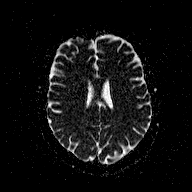
[im 45/54]
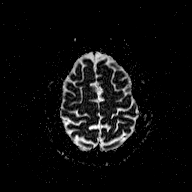
[im 54/54]
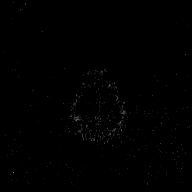

[Series 6: DWI · coronal · 3.0mm · 1.20mm/px · 6 of 45 slices shown (2 of 4)]
[im 1/45]
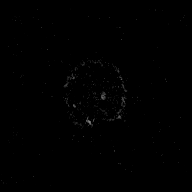
[im 9/45]
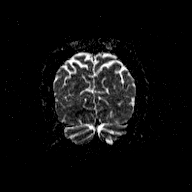
[im 18/45]
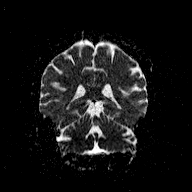
[im 27/45]
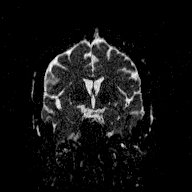
[im 36/45]
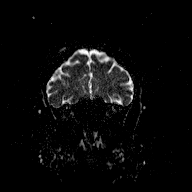
[im 45/45]
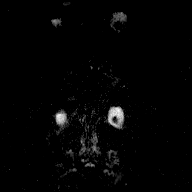

[Series 7: T2 · axial · 5.0mm · 0.72mm/px · z∈[-64,+93]mm · 3 of 26 slices shown (1 of 3)]
[im 1/26]
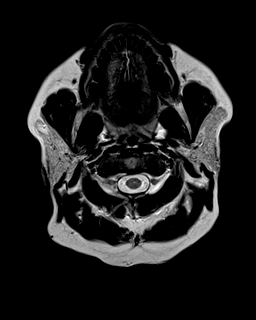
[im 13/26]
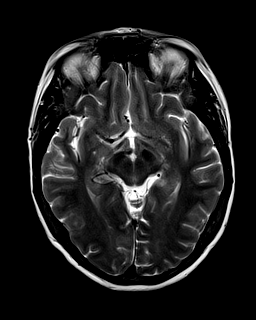
[im 26/26]
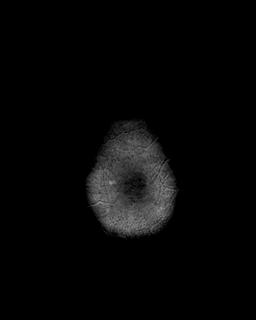

[Series 8: FLAIR · axial · 5.0mm · 0.45mm/px · z∈[-64,+93]mm · 3 of 26 slices shown]
[im 1/26]
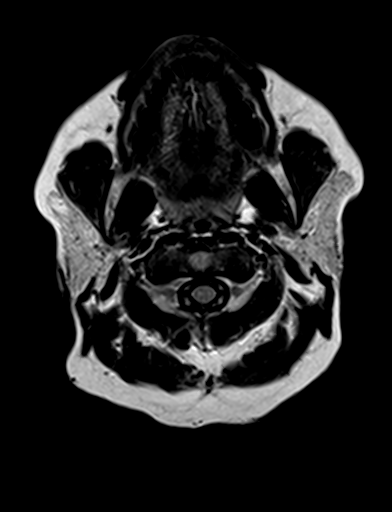
[im 13/26]
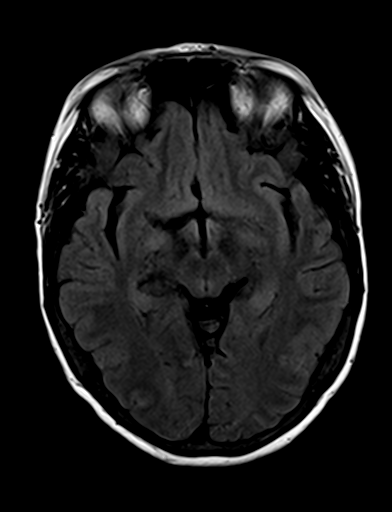
[im 26/26]
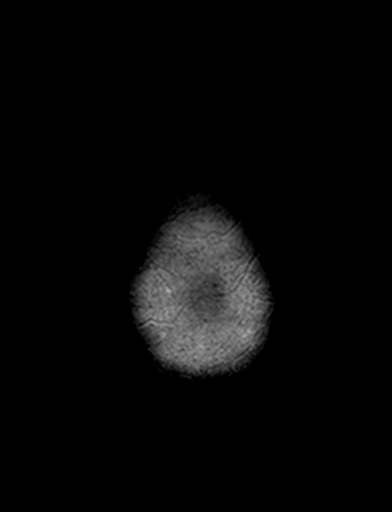

[Series 9: T2 · axial · 5.0mm · 0.72mm/px · z∈[-64,+93]mm · 3 of 26 slices shown (2 of 3)]
[im 1/26]
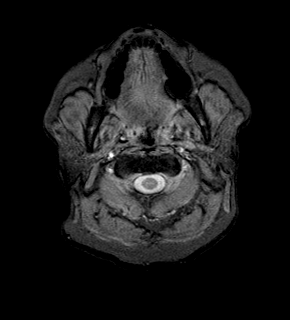
[im 13/26]
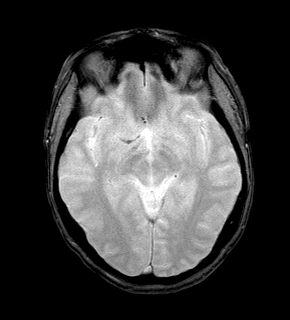
[im 26/26]
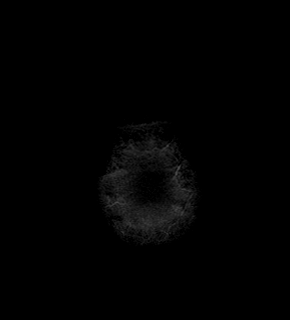

[Series 10: T1 · axial · 3.0mm · 1.00mm/px · z∈[-68,+104]mm · 7 of 60 slices shown (2 of 2)]
[im 1/60]
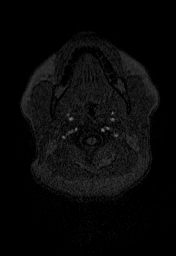
[im 10/60]
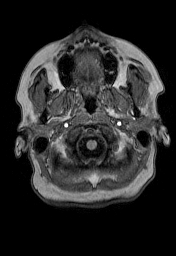
[im 20/60]
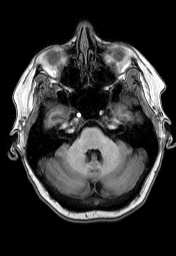
[im 30/60]
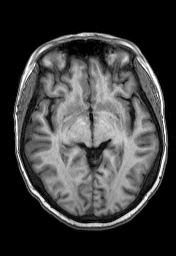
[im 40/60]
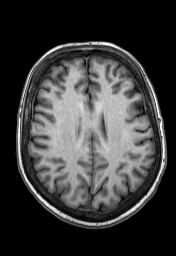
[im 50/60]
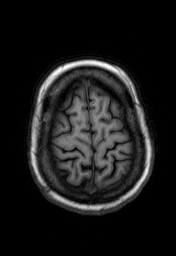
[im 60/60]
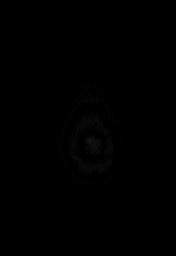

[Series 11: T2 · coronal · 5.0mm · 0.45mm/px · 4 of 29 slices shown (3 of 3)]
[im 1/29]
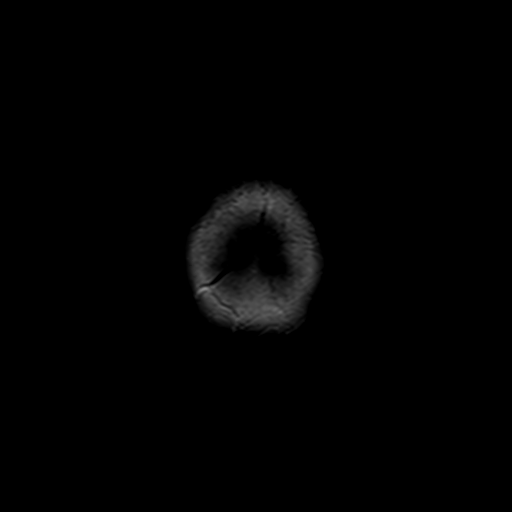
[im 10/29]
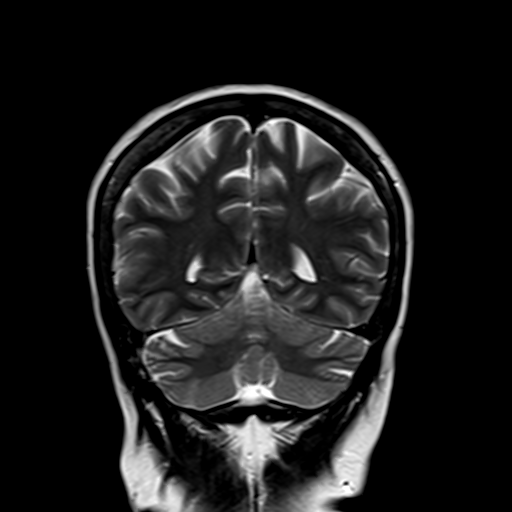
[im 19/29]
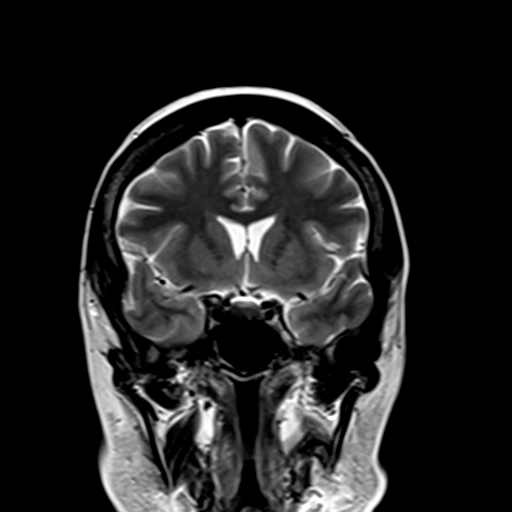
[im 29/29]
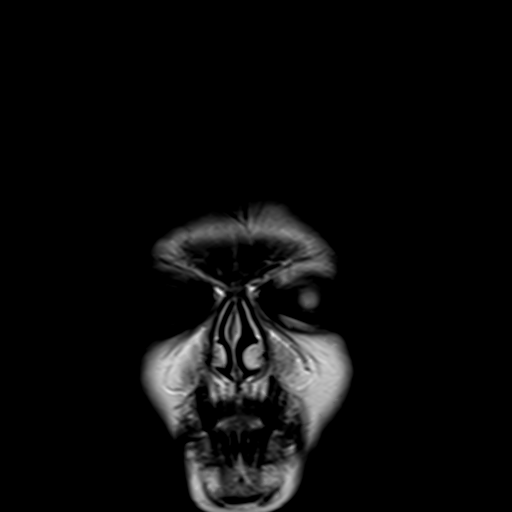

[Series 100: DWI · axial · 3.0mm · 1.20mm/px · z∈[-64,+93]mm · 7 of 55 slices shown (3 of 4)]
[im 1/55]
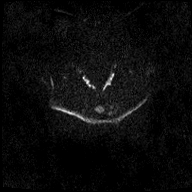
[im 10/55]
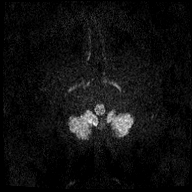
[im 19/55]
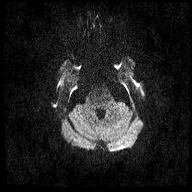
[im 28/55]
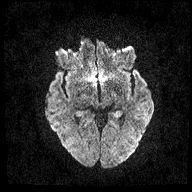
[im 37/55]
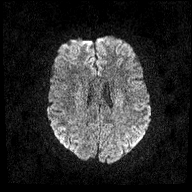
[im 46/55]
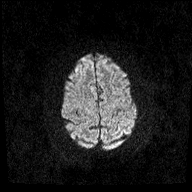
[im 55/55]
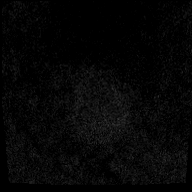

[Series 101: DWI · coronal · 3.0mm · 1.20mm/px · 5 of 42 slices shown (4 of 4)]
[im 1/42]
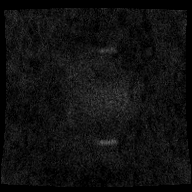
[im 11/42]
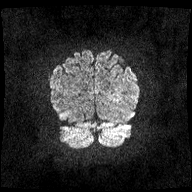
[im 21/42]
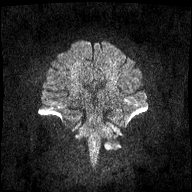
[im 31/42]
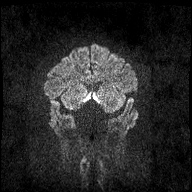
[im 42/42]
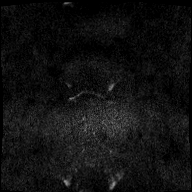

[48 of 48 positions shown; findings below may reference images not displayed]

FINDINGS: Ventricle size is normal.  Cerebral volume is normal.

Negative for acute or chronic infarction

Negative for demyelinating disease. Cerebral white matter is normal.
Brainstem and basal ganglia normal.

Negative for intracranial hemorrhage. Negative for mass or edema. No
shift of the midline structures.

Pituitary normal in size. Normal orbit. Paranasal sinuses clear.
Skull base normal. Vessels of the base the brain are patent.
IMPRESSION: Normal unenhanced MRI of the brain.

## 2016-07-28 ENCOUNTER — Other Ambulatory Visit: Payer: Self-pay | Admitting: Nurse Practitioner

## 2016-07-28 ENCOUNTER — Ambulatory Visit
Admission: RE | Admit: 2016-07-28 | Discharge: 2016-07-28 | Disposition: A | Discharge status: Home / Self Care | Payer: BLUE CROSS/BLUE SHIELD | Source: Ambulatory Visit | Attending: Nurse Practitioner | Admitting: Nurse Practitioner

## 2016-07-28 DIAGNOSIS — M545 Low back pain: Secondary | ICD-10-CM

## 2016-07-28 DIAGNOSIS — M47816 Spondylosis without myelopathy or radiculopathy, lumbar region: Secondary | ICD-10-CM | POA: Insufficient documentation

## 2016-07-28 DIAGNOSIS — M5440 Lumbago with sciatica, unspecified side: Secondary | ICD-10-CM | POA: Insufficient documentation

## 2016-07-29 DIAGNOSIS — E119 Type 2 diabetes mellitus without complications: Secondary | ICD-10-CM | POA: Diagnosis not present

## 2016-08-14 DIAGNOSIS — E119 Type 2 diabetes mellitus without complications: Secondary | ICD-10-CM | POA: Diagnosis not present

## 2016-08-14 DIAGNOSIS — J069 Acute upper respiratory infection, unspecified: Secondary | ICD-10-CM | POA: Diagnosis not present

## 2016-08-14 DIAGNOSIS — I10 Essential (primary) hypertension: Secondary | ICD-10-CM | POA: Diagnosis not present

## 2016-08-15 ENCOUNTER — Other Ambulatory Visit: Payer: Self-pay | Admitting: Internal Medicine

## 2016-08-15 DIAGNOSIS — C50412 Malignant neoplasm of upper-outer quadrant of left female breast: Secondary | ICD-10-CM

## 2016-08-21 ENCOUNTER — Other Ambulatory Visit: Payer: Self-pay

## 2016-08-21 DIAGNOSIS — Z1231 Encounter for screening mammogram for malignant neoplasm of breast: Secondary | ICD-10-CM

## 2016-08-27 DIAGNOSIS — N2889 Other specified disorders of kidney and ureter: Secondary | ICD-10-CM | POA: Diagnosis not present

## 2016-08-27 DIAGNOSIS — Z6834 Body mass index (BMI) 34.0-34.9, adult: Secondary | ICD-10-CM | POA: Diagnosis not present

## 2016-08-27 DIAGNOSIS — N393 Stress incontinence (female) (male): Secondary | ICD-10-CM | POA: Diagnosis not present

## 2016-08-27 DIAGNOSIS — Z85528 Personal history of other malignant neoplasm of kidney: Secondary | ICD-10-CM | POA: Diagnosis not present

## 2016-08-27 DIAGNOSIS — N23 Unspecified renal colic: Secondary | ICD-10-CM | POA: Diagnosis not present

## 2016-08-27 DIAGNOSIS — Z9889 Other specified postprocedural states: Secondary | ICD-10-CM | POA: Diagnosis not present

## 2016-09-03 DIAGNOSIS — M5431 Sciatica, right side: Secondary | ICD-10-CM | POA: Diagnosis not present

## 2016-09-03 DIAGNOSIS — M5414 Radiculopathy, thoracic region: Secondary | ICD-10-CM | POA: Diagnosis not present

## 2016-10-13 ENCOUNTER — Ambulatory Visit
Admission: RE | Admit: 2016-10-13 | Discharge: 2016-10-13 | Disposition: A | Discharge status: Home / Self Care | Payer: BLUE CROSS/BLUE SHIELD | Source: Ambulatory Visit | Attending: General Surgery | Admitting: General Surgery

## 2016-10-13 DIAGNOSIS — Z9012 Acquired absence of left breast and nipple: Secondary | ICD-10-CM | POA: Diagnosis not present

## 2016-10-13 DIAGNOSIS — Z1231 Encounter for screening mammogram for malignant neoplasm of breast: Secondary | ICD-10-CM | POA: Diagnosis not present

## 2016-10-13 HISTORY — DX: Personal history of antineoplastic chemotherapy: Z92.21

## 2016-10-20 ENCOUNTER — Encounter: Payer: Self-pay | Admitting: General Surgery

## 2016-10-20 ENCOUNTER — Ambulatory Visit (INDEPENDENT_AMBULATORY_CARE_PROVIDER_SITE_OTHER): Payer: BLUE CROSS/BLUE SHIELD | Admitting: General Surgery

## 2016-10-20 VITALS — BP 130/74 | HR 86 | Resp 13 | Ht 66.0 in | Wt 205.0 lb

## 2016-10-20 DIAGNOSIS — Z170 Estrogen receptor positive status [ER+]: Secondary | ICD-10-CM | POA: Diagnosis not present

## 2016-10-20 DIAGNOSIS — C50412 Malignant neoplasm of upper-outer quadrant of left female breast: Secondary | ICD-10-CM | POA: Diagnosis not present

## 2016-10-20 NOTE — Progress Notes (Signed)
Patient ID: Danielle Wallace, female   DOB: [birth year], 56 y.o.   MRN: [id number]  Chief Complaint  Patient presents with  . Follow-up    HPI Danielle Wallace is a 56 y.o. female who presents for a breast evaluation. The most recent right breast  mammogram was done on 10/13/2016.  Patient does perform regular self breast checks and gets regular mammograms done.    HPI  Past Medical History:  Diagnosis Date  . Arthritis 2006  . Asthma   . Breast cancer ([place]) 10/14/2013   chemo, herception- Left  . Cancer [facility]) 2006   Renal cell carcinoma; cryosurgery treatment  . Collapsed lung 2007  . Diabetes mellitus without complication ([place]) 4098   Metformin  . Diffuse cystic mastopathy   . Personal history of chemotherapy   . Sinus problem     Past Surgical History:  Procedure Laterality Date  . BREAST BIOPSY Right 2005   negative  . BREAST BIOPSY Left 08/23/2007   negative  . BREAST BIOPSY Left 10/24/2013   positive  . BREAST BIOPSY Left 07/2002   neg  . BREAST BIOPSY Left 08/23/2007   neg  . BREAST SURGERY Left 10/2011   Cyst Aspirationapocrine metaplasia, ductal cells and bone cells, hypo-cellular  . BREAST SURGERY Left 2009   ADH on stereotactic biopsy, 1.5 mm focus.  Marland Kitchen BREAST SURGERY Left 2003   fibrocystic changes with ductal hyperplasia without atypia.  Marland Kitchen BREAST SURGERY Left    mastectomy  . BREAST SURGERY Left April 11, 2014   Removal of implant, debridement chest wall Dr.Coan  . CESAREAN SECTION  1991  . CHOLECYSTECTOMY  1990  . COLONOSCOPY  08/26/2013   Verdie Shire, M.D. normal.  . CRYOABLATION  2005, 2010  . CYST REMOVAL NECK  10/2011   Dr. Tami Ribas  . INCISION AND DRAINAGE Left Dec 2015, Feb 2016   Dr Tula Nakayama  . MASTECTOMY Left 11/24/2013   positive  . PORTACATH PLACEMENT  11/24/13    Family History  Problem Relation Age of Onset  . Liver cancer Father   . Diabetes Mother   . Hypertension Mother   . Breast cancer Paternal Aunt 87  . Ovarian cancer Maternal  Grandmother     Social History Social History  Substance Use Topics  . Smoking status: Never Smoker  . Smokeless tobacco: Never Used  . Alcohol use Yes     Comment: occasionally    Allergies  Allergen Reactions  . Exemestane Anaphylaxis  . Linezolid Other (See Comments)  . Taxotere [Docetaxel] Anaphylaxis  . Floxin [Ofloxacin] Other (See Comments)    systemic  . Hydrocodone Itching  . Sulfa Antibiotics Other (See Comments)    blisters  . Codeine Rash  . Penicillins Rash  . Vancomycin Rash    Current Outpatient Prescriptions  Medication Sig Dispense Refill  . Calcium Carb-Cholecalciferol (CALCIUM + D3) 600-200 MG-UNIT TABS Take 1 tablet by mouth 2 (two) times daily. 180 tablet 3  . canagliflozin (INVOKANA) 300 MG TABS tablet TAKE 1 TABLET BY MOUTH EVERY DAY    . etodolac (LODINE) 400 MG tablet TAKE 1 TABLET BY MOUTH 3 TIMES A DAY AS NEEDED FOR PAIN    . gabapentin (NEURONTIN) 600 MG tablet Take 600 mg by mouth 3 (three) times daily.  2  . imipramine (TOFRANIL) 25 MG tablet Take 25 mg by mouth at bedtime.    Marland Kitchen levocetirizine (XYZAL) 5 MG tablet Take 5 mg by mouth every evening.    . linaclotide (  LINZESS) 145 MCG CAPS capsule Take 145 mcg by mouth daily before breakfast.    . metFORMIN (GLUCOPHAGE) 500 MG tablet Take 2,000 mg by mouth every evening.     . tamoxifen (NOLVADEX) 20 MG tablet TAKE 1 TABLET (20 MG TOTAL) BY MOUTH DAILY. 90 tablet 0  . valsartan-hydrochlorothiazide (DIOVAN-HCT) 80-12.5 MG per tablet Take 1 tablet by mouth daily.     No current facility-administered medications for this visit.     Review of Systems Review of Systems  Constitutional: Negative.   Respiratory: Negative.   Cardiovascular: Negative.     Blood pressure 130/74, pulse 86, resp. rate 13, height 5' 6"  (1.676 m), weight 205 lb (93 kg).  Physical Exam Physical Exam  Constitutional: She is oriented to person, place, and time. She appears well-developed and well-nourished.  Eyes:  Conjunctivae are normal. No scleral icterus.  Neck: Neck supple.  Cardiovascular: Normal rate, regular rhythm and normal heart sounds.   Pulmonary/Chest: Effort normal and breath sounds normal. Right breast exhibits no inverted nipple, no mass, no nipple discharge, no skin change and no tenderness.    Left mastectomy site is clean and well healed.   Lymphadenopathy:    She has no cervical adenopathy.       Left: No supraclavicular adenopathy present.  Neurological: She is alert and oriented to person, place, and time.  Skin: Skin is warm and dry.    Data Reviewed Right breast screening mammogram dated 10/13/2016 reviewed. BI-RADS-1. These films were independently reviewed.   Assessment    Doing well now 3 years status post treatment of a T1c    Plan    Patient is presently using a phone prosthesis. She is considering meeting with plastics or repeat reconstruction (original implant removed secondary to infection) .  Colonoscopy up-to-date completed in 2015.    Patient will be asked to return to the office in one year with a right screening mammogram.  HPI, Physical Exam, Assessment and Plan have been scribed under the direction and in the presence of Hervey Ard, MD.  Gaspar Cola, CMA  I have completed the exam and reviewed the above documentation for accuracy and completeness.  I agree with the above.  Haematologist has been used and any errors in dictation or transcription are unintentional.  Hervey Ard, M.D., F.A.C.S.   Robert Bellow 10/20/2016, 3:04 PM

## 2016-10-20 NOTE — Patient Instructions (Signed)
Patient will be asked to return to the office in one year with a right screening mammogram.

## 2016-10-21 DIAGNOSIS — T781XXA Other adverse food reactions, not elsewhere classified, initial encounter: Secondary | ICD-10-CM | POA: Diagnosis not present

## 2016-10-21 DIAGNOSIS — I10 Essential (primary) hypertension: Secondary | ICD-10-CM | POA: Diagnosis not present

## 2016-10-21 DIAGNOSIS — E119 Type 2 diabetes mellitus without complications: Secondary | ICD-10-CM | POA: Diagnosis not present

## 2016-10-21 DIAGNOSIS — R0602 Shortness of breath: Secondary | ICD-10-CM | POA: Diagnosis not present

## 2016-10-22 ENCOUNTER — Other Ambulatory Visit: Payer: Self-pay | Admitting: Nurse Practitioner

## 2016-10-22 DIAGNOSIS — R21 Rash and other nonspecific skin eruption: Secondary | ICD-10-CM | POA: Diagnosis not present

## 2016-10-22 DIAGNOSIS — R0602 Shortness of breath: Secondary | ICD-10-CM

## 2016-10-24 ENCOUNTER — Ambulatory Visit: Payer: BLUE CROSS/BLUE SHIELD | Admitting: Internal Medicine

## 2016-10-24 ENCOUNTER — Other Ambulatory Visit: Payer: BLUE CROSS/BLUE SHIELD

## 2016-10-27 ENCOUNTER — Ambulatory Visit
Admission: RE | Admit: 2016-10-27 | Discharge: 2016-10-27 | Disposition: A | Discharge status: Home / Self Care | Payer: BLUE CROSS/BLUE SHIELD | Source: Ambulatory Visit | Attending: Nurse Practitioner | Admitting: Nurse Practitioner

## 2016-10-27 DIAGNOSIS — R918 Other nonspecific abnormal finding of lung field: Secondary | ICD-10-CM | POA: Diagnosis not present

## 2016-10-27 DIAGNOSIS — I864 Gastric varices: Secondary | ICD-10-CM | POA: Insufficient documentation

## 2016-10-27 DIAGNOSIS — R0602 Shortness of breath: Secondary | ICD-10-CM

## 2016-10-27 LAB — POCT I-STAT CREATININE: CREATININE: 0.6 mg/dL (ref 0.44–1.00)

## 2016-10-27 MED ORDER — IOPAMIDOL (ISOVUE-300) INJECTION 61%
75.0000 mL | Freq: Once | INTRAVENOUS | Status: AC | PRN
  Administered 2016-10-27: 75 mL via INTRAVENOUS

## 2016-11-27 ENCOUNTER — Inpatient Hospital Stay (HOSPITAL_BASED_OUTPATIENT_CLINIC_OR_DEPARTMENT_OTHER): Payer: BLUE CROSS/BLUE SHIELD | Admitting: Internal Medicine

## 2016-11-27 ENCOUNTER — Inpatient Hospital Stay: Payer: BLUE CROSS/BLUE SHIELD | Attending: Internal Medicine

## 2016-11-27 VITALS — BP 133/84 | HR 82 | Temp 98.0°F | Resp 16 | Wt 206.0 lb

## 2016-11-27 DIAGNOSIS — R7989 Other specified abnormal findings of blood chemistry: Secondary | ICD-10-CM | POA: Diagnosis not present

## 2016-11-27 DIAGNOSIS — R600 Localized edema: Secondary | ICD-10-CM

## 2016-11-27 DIAGNOSIS — C50412 Malignant neoplasm of upper-outer quadrant of left female breast: Secondary | ICD-10-CM

## 2016-11-27 DIAGNOSIS — Z79810 Long term (current) use of selective estrogen receptor modulators (SERMs): Secondary | ICD-10-CM | POA: Diagnosis not present

## 2016-11-27 DIAGNOSIS — Z170 Estrogen receptor positive status [ER+]: Secondary | ICD-10-CM

## 2016-11-27 DIAGNOSIS — Z808 Family history of malignant neoplasm of other organs or systems: Secondary | ICD-10-CM | POA: Diagnosis not present

## 2016-11-27 DIAGNOSIS — Z880 Allergy status to penicillin: Secondary | ICD-10-CM

## 2016-11-27 DIAGNOSIS — Z9012 Acquired absence of left breast and nipple: Secondary | ICD-10-CM

## 2016-11-27 DIAGNOSIS — E119 Type 2 diabetes mellitus without complications: Secondary | ICD-10-CM | POA: Diagnosis not present

## 2016-11-27 DIAGNOSIS — R5383 Other fatigue: Secondary | ICD-10-CM | POA: Diagnosis not present

## 2016-11-27 DIAGNOSIS — Z885 Allergy status to narcotic agent status: Secondary | ICD-10-CM

## 2016-11-27 DIAGNOSIS — Z79899 Other long term (current) drug therapy: Secondary | ICD-10-CM | POA: Diagnosis not present

## 2016-11-27 DIAGNOSIS — Z8041 Family history of malignant neoplasm of ovary: Secondary | ICD-10-CM | POA: Insufficient documentation

## 2016-11-27 DIAGNOSIS — Z881 Allergy status to other antibiotic agents status: Secondary | ICD-10-CM

## 2016-11-27 DIAGNOSIS — Z9221 Personal history of antineoplastic chemotherapy: Secondary | ICD-10-CM | POA: Insufficient documentation

## 2016-11-27 DIAGNOSIS — R232 Flushing: Secondary | ICD-10-CM | POA: Diagnosis not present

## 2016-11-27 DIAGNOSIS — K760 Fatty (change of) liver, not elsewhere classified: Secondary | ICD-10-CM | POA: Diagnosis not present

## 2016-11-27 DIAGNOSIS — Z7984 Long term (current) use of oral hypoglycemic drugs: Secondary | ICD-10-CM

## 2016-11-27 DIAGNOSIS — Z85528 Personal history of other malignant neoplasm of kidney: Secondary | ICD-10-CM | POA: Diagnosis not present

## 2016-11-27 DIAGNOSIS — Z8614 Personal history of Methicillin resistant Staphylococcus aureus infection: Secondary | ICD-10-CM

## 2016-11-27 DIAGNOSIS — Z9882 Breast implant status: Secondary | ICD-10-CM

## 2016-11-27 DIAGNOSIS — Z803 Family history of malignant neoplasm of breast: Secondary | ICD-10-CM | POA: Diagnosis not present

## 2016-11-27 LAB — COMPREHENSIVE METABOLIC PANEL
ALK PHOS: 91 U/L (ref 38–126)
ALT: 115 U/L — ABNORMAL HIGH (ref 14–54)
ANION GAP: 7 (ref 5–15)
AST: 107 U/L — ABNORMAL HIGH (ref 15–41)
Albumin: 3.8 g/dL (ref 3.5–5.0)
BUN: 19 mg/dL (ref 6–20)
CALCIUM: 9.3 mg/dL (ref 8.9–10.3)
CO2: 29 mmol/L (ref 22–32)
Chloride: 99 mmol/L — ABNORMAL LOW (ref 101–111)
Creatinine, Ser: 0.63 mg/dL (ref 0.44–1.00)
Glucose, Bld: 143 mg/dL — ABNORMAL HIGH (ref 65–99)
Potassium: 4.4 mmol/L (ref 3.5–5.1)
SODIUM: 135 mmol/L (ref 135–145)
Total Bilirubin: 0.6 mg/dL (ref 0.3–1.2)
Total Protein: 7 g/dL (ref 6.5–8.1)

## 2016-11-27 LAB — IRON AND TIBC
IRON: 57 ug/dL (ref 28–170)
SATURATION RATIOS: 15 % (ref 10.4–31.8)
TIBC: 382 ug/dL (ref 250–450)
UIBC: 325 ug/dL

## 2016-11-27 LAB — CBC WITH DIFFERENTIAL/PLATELET
Basophils Absolute: 0 10*3/uL (ref 0–0.1)
Basophils Relative: 1 %
EOS ABS: 0.1 10*3/uL (ref 0–0.7)
Eosinophils Relative: 3 %
HCT: 42 % (ref 35.0–47.0)
HEMOGLOBIN: 14.8 g/dL (ref 12.0–16.0)
LYMPHS ABS: 2 10*3/uL (ref 1.0–3.6)
Lymphocytes Relative: 34 %
MCH: 31.7 pg (ref 26.0–34.0)
MCHC: 35.2 g/dL (ref 32.0–36.0)
MCV: 90.2 fL (ref 80.0–100.0)
MONOS PCT: 8 %
Monocytes Absolute: 0.5 10*3/uL (ref 0.2–0.9)
NEUTROS PCT: 54 %
Neutro Abs: 3.1 10*3/uL (ref 1.4–6.5)
Platelets: 198 10*3/uL (ref 150–440)
RBC: 4.66 MIL/uL (ref 3.80–5.20)
RDW: 13 % (ref 11.5–14.5)
WBC: 5.7 10*3/uL (ref 3.6–11.0)

## 2016-11-27 LAB — TSH: TSH: 1.331 u[IU]/mL (ref 0.350–4.500)

## 2016-11-27 LAB — FERRITIN: Ferritin: 138 ng/mL (ref 11–307)

## 2016-11-27 NOTE — Progress Notes (Signed)
[facility] OFFICE PROGRESS NOTE  Patient Care Team: Lavera Guise, MD as PCP - General (Internal Medicine) Bary Castilla, Forest Gleason, MD (General Surgery) Dion Body, MD as Referring Physician (Family Medicine) Forest Gleason, MD (Oncology)  Cancer Staging No matching staging information was found for the patient.    Oncology History   # July 2016- 1.carcinoma of breast(left) T1 C. N0 M0 status post ultrasound-guided biopsy Estrogen receptor positive. Progesterone receptor positive.  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 chemotherapy on hold since November because of cellulitis in the chest wall. 4.started on Herceptin May 22, 2014.  Cause of recurrent chest wall  infection chemotherapy was put on hold. 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  Patient is on multiple antibiotic and as per infectious disease specialist similar to stay on antibiotic for 4 months. So chemotherapy has been discontinued. Maintenance Herceptin as well as patient was started on letrozole from June 12, 2014 6.  Port was removed because of Pseudomonas infection of the pocket (August, 2016) 7.  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  Patient had a recent CT scan of the chest with her PCP for significant shortness of breath with minimal exertion. She also feels fatigued. Has mild swelling in the legs.  Complains of abdominal discomfort;  Feeling poorly.  Patient has made significant changes to her diet/healthy- joint weight watcher's. She has lost some weight.  Otherwise patient denies any new lumps or bumps. Denies any bone pain or headaches.  Denies any alcohol abuse.  REVIEW OF SYSTEMS:  A complete 10 point review of system is done which is negative except mentioned above/history of present illness.   PAST MEDICAL HISTORY :  Past Medical History:  Diagnosis Date  . Arthritis 2006  . Asthma   . Breast cancer ([place]) 10/14/2013   18 mm, T1c, N0; ER/ PR positive,her 2 neu overexpressed. Adjuvant chemo/ herceptin.  . Cancer [facility]) 2006   Renal cell carcinoma; cryosurgery treatment  . Collapsed lung 2007  . Diabetes mellitus without complication ([place]) 7371   Metformin  . Diffuse cystic mastopathy   . Personal history of chemotherapy   . Sinus problem     PAST SURGICAL HISTORY :   Past Surgical History:  Procedure Laterality Date  . BREAST BIOPSY Right 2005   negative  . BREAST BIOPSY Left 08/23/2007   negative  . BREAST BIOPSY Left 10/24/2013   positive  . BREAST BIOPSY Left 07/2002   neg  . BREAST BIOPSY Left 08/23/2007   neg  . BREAST SURGERY Left 10/2011   Cyst Aspirationapocrine metaplasia, ductal cells and bone cells, hypo-cellular  . BREAST SURGERY Left  2009   ADH on stereotactic biopsy, 1.5 mm focus.  Marland Kitchen BREAST SURGERY Left 2003   fibrocystic changes with ductal hyperplasia without atypia.  Marland Kitchen BREAST SURGERY Left    mastectomy  . BREAST SURGERY Left April 11, 2014   Removal of implant, debridement chest wall Dr.Coan  . CESAREAN SECTION  1991  . CHOLECYSTECTOMY  1990  . COLONOSCOPY  08/26/2013   Verdie Shire, M.D. normal.  . CRYOABLATION  2005,  2010  . CYST REMOVAL NECK  10/2011   Dr. Tami Ribas  . INCISION AND DRAINAGE Left Dec 2015, Feb 2016   Dr Tula Nakayama  . MASTECTOMY Left 11/24/2013   positive  . PORTACATH PLACEMENT  11/24/13    FAMILY HISTORY :   Family History  Problem Relation Age of Onset  . Liver cancer Father   . Diabetes Mother   . Hypertension Mother   . Breast cancer Paternal Aunt 53  . Ovarian cancer Maternal Grandmother     SOCIAL HISTORY:   Social History  Substance Use Topics  . Smoking status: Never Smoker  . Smokeless tobacco: Never Used  . Alcohol use Yes     Comment: occasionally    ALLERGIES:  is allergic to exemestane; levofloxacin; linezolid; taxotere [docetaxel]; floxin [ofloxacin]; hydrocodone; sulfa antibiotics; codeine; penicillins; and vancomycin.  MEDICATIONS:  Current Outpatient Prescriptions  Medication Sig Dispense Refill  . canagliflozin (INVOKANA) 300 MG TABS tablet TAKE 1 TABLET BY MOUTH EVERY DAY    . etodolac (LODINE) 400 MG tablet TAKE 1 TABLET BY MOUTH 3 TIMES A DAY AS NEEDED FOR PAIN    . gabapentin (NEURONTIN) 600 MG tablet Take 600 mg by mouth 3 (three) times daily.  2  . glipiZIDE (GLUCOTROL XL) 5 MG 24 hr tablet TAKE 1 TABLET (5 MG TOTAL) BY MOUTH ONCE DAILY. TAKE WITH EVENING MEAL.  1  . imipramine (TOFRANIL) 25 MG tablet Take 25 mg by mouth at bedtime.    Marland Kitchen levocetirizine (XYZAL) 5 MG tablet Take 5 mg by mouth every evening.    . linaclotide (LINZESS) 145 MCG CAPS capsule Take 145 mcg by mouth daily before breakfast.    . metFORMIN (GLUCOPHAGE) 500 MG tablet Take 2,000 mg by mouth every evening.     . tamoxifen (NOLVADEX) 20 MG tablet TAKE 1 TABLET (20 MG TOTAL) BY MOUTH DAILY. 90 tablet 0  . valsartan-hydrochlorothiazide (DIOVAN-HCT) 80-12.5 MG per tablet Take 1 tablet by mouth daily.    . Calcium Carb-Cholecalciferol (CALCIUM + D3) 600-200 MG-UNIT TABS Take 1 tablet by mouth 2 (two) times daily. (Patient not taking: Reported on 11/27/2016) 180 tablet 3  . venlafaxine XR  (EFFEXOR-XR) 37.5 MG 24 hr capsule TAKE 1 CAPSULE (37.5 MG TOTAL) BY MOUTH DAILY WITH BREAKFAST.  3   No current facility-administered medications for this visit.     PHYSICAL EXAMINATION: ECOG PERFORMANCE STATUS: 0 - Asymptomatic  BP 133/84 (BP Location: Right Arm)   Pulse 82   Temp 98 F (36.7 C) (Tympanic)   Resp 16   Wt 206 lb (93.4 kg)   BMI 33.25 kg/m   Filed Weights   11/27/16 1135  Weight: 206 lb (93.4 kg)    GENERAL: Well-nourished well-developed; Alert, no distress and comfortable.   Alone.  EYES: no pallor or icterus OROPHARYNX: no thrush or ulceration; good dentition  NECK: supple, no masses felt LYMPH:  no palpable lymphadenopathy in the cervical, axillary or inguinal regions LUNGS: clear to auscultation and  No wheeze or crackles HEART/CVS: regular rate &  rhythm and no murmurs; +1 bil lower extremity edema ABDOMEN:abdomen soft, non-tender and normal bowel sounds; Question hepatomegaly. Mild tend epigastric region. Musculoskeletal:no cyanosis of digits and no clubbing  PSYCH: alert & oriented x 3 with fluent speech NEURO: no focal motor/sensory deficits Right and left BREAST exam [in the presence of nurse]-  Right breastno unusual skin changes or dominant masses felt.  Left mastectomy- Surgical scars noted; no lumps or bumps noted      LABORATORY DATA:  I have reviewed the data as listed    Component Value Date/Time   NA 135 11/27/2016 1105   NA 138 09/04/2014 1358   K 4.4 11/27/2016 1105   K 4.0 09/04/2014 1358   CL 99 (L) 11/27/2016 1105   CL 99 (L) 09/04/2014 1358   CO2 29 11/27/2016 1105   CO2 31 09/04/2014 1358   GLUCOSE 143 (H) 11/27/2016 1105   GLUCOSE 136 (H) 09/04/2014 1358   BUN 19 11/27/2016 1105   BUN 18 09/04/2014 1358   CREATININE 0.63 11/27/2016 1105   CREATININE 0.53 09/04/2014 1358   CALCIUM 9.3 11/27/2016 1105   CALCIUM 9.7 09/04/2014 1358   PROT 7.0 11/27/2016 1105   PROT 7.2 11/01/2015 1640   PROT 7.8 09/04/2014 1358    ALBUMIN 3.8 11/27/2016 1105   ALBUMIN 4.2 11/01/2015 1640   ALBUMIN 4.1 09/04/2014 1358   AST 107 (H) 11/27/2016 1105   AST 32 09/04/2014 1358   ALT 115 (H) 11/27/2016 1105   ALT 33 09/04/2014 1358   ALKPHOS 91 11/27/2016 1105   ALKPHOS 73 09/04/2014 1358   BILITOT 0.6 11/27/2016 1105   BILITOT 0.3 11/01/2015 1640   BILITOT 0.6 09/04/2014 1358   GFRNONAA >60 11/27/2016 1105   GFRNONAA >60 09/04/2014 1358   GFRAA >60 11/27/2016 1105   GFRAA >60 09/04/2014 1358    No results found for: SPEP, UPEP  Lab Results  Component Value Date   WBC 5.7 11/27/2016   NEUTROABS 3.1 11/27/2016   HGB 14.8 11/27/2016   HCT 42.0 11/27/2016   MCV 90.2 11/27/2016   PLT 198 11/27/2016      Chemistry      Component Value Date/Time   NA 135 11/27/2016 1105   NA 138 09/04/2014 1358   K 4.4 11/27/2016 1105   K 4.0 09/04/2014 1358   CL 99 (L) 11/27/2016 1105   CL 99 (L) 09/04/2014 1358   CO2 29 11/27/2016 1105   CO2 31 09/04/2014 1358   BUN 19 11/27/2016 1105   BUN 18 09/04/2014 1358   CREATININE 0.63 11/27/2016 1105   CREATININE 0.53 09/04/2014 1358      Component Value Date/Time   CALCIUM 9.3 11/27/2016 1105   CALCIUM 9.7 09/04/2014 1358   ALKPHOS 91 11/27/2016 1105   ALKPHOS 73 09/04/2014 1358   AST 107 (H) 11/27/2016 1105   AST 32 09/04/2014 1358   ALT 115 (H) 11/27/2016 1105   ALT 33 09/04/2014 1358   BILITOT 0.6 11/27/2016 1105   BILITOT 0.3 11/01/2015 1640   BILITOT 0.6 09/04/2014 1358       RADIOGRAPHIC STUDIES: I have personally reviewed the radiological images as listed and agreed with the findings in the report. No results found.   ASSESSMENT & PLAN:  Carcinoma of upper-outer quadrant of left breast in female, estrogen receptor positive ([place]) Stage I Breast cancer ER/PR/her 2 neu POS-  currently on tamoxifen. Tolerating well. Clinically no evidence of recurrence. Mammogram in June 2018- negative. Continue tamoxifen.  # Elevated LFTs~  approximately twice the upper  limit. Bilirubin normal. 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   # hot flashes/ vasomotor symptoms- off effexor; not any worse. STABLE.   # Fatigue- add TSH today.   # ? Elevated Ferritin- [family hx of hemochromatosis]- Iron saturation 15%; ferritin normal.  Labs not  Suggestive of hemochromatosis.  # BMD- Normal Feb 2017; continue calcium and vitamin D.  # follow up in 6 months/labs prior.    Addendum; TSH-  1.33/ normal.  CC: Dr.Khan  # 25 minutes face-to-face with the patient discussing the above plan of care; more than 50% of time spent on prognosis/ natural history; counseling and coordination.   Orders Placed This Encounter  Procedures  . CBC with Differential/Platelet    Standing Status:   Future    Standing Expiration Date:   11/27/2017  . Comprehensive metabolic panel    Standing Status:   Future    Standing Expiration Date:   11/27/2017  . Cancer antigen 27.29    Standing Status:   Future    Standing Expiration Date:   11/27/2017  . Ferritin    Standing Status:   Future    Standing Expiration Date:   11/27/2017  . Iron and TIBC    Standing Status:   Future    Standing Expiration Date:   11/27/2017  . TSH    Standing Status:   Future    Number of Occurrences:   1    Standing Expiration Date:   11/27/2017   All questions were answered. The patient knows to call the clinic with any problems, questions or concerns.      Cammie Sickle, MD 11/28/2016 8:55 AM

## 2016-11-27 NOTE — Progress Notes (Signed)
Patient is here today for a follow up. Patient has no new concerns today.

## 2016-11-27 NOTE — Assessment & Plan Note (Addendum)
Stage I Breast cancer ER/PR/her 2 neu POS-  currently on tamoxifen. Tolerating well. Clinically no evidence of recurrence. Mammogram in June 2018- negative. Continue tamoxifen.  # Elevated LFTs~ approximately twice the upper limit. Bilirubin normal. 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   # hot flashes/ vasomotor symptoms- off effexor; not any worse. STABLE.   # Fatigue- add TSH today.   # ? Elevated Ferritin- [family hx of hemochromatosis]- Iron saturation 15%; ferritin normal.  Labs not  Suggestive of hemochromatosis.  # BMD- Normal Feb 2017; continue calcium and vitamin D.  # follow up in 6 months/labs prior.    Addendum; TSH-  1.33/ normal.  CC: Dr.Khan

## 2016-11-28 ENCOUNTER — Other Ambulatory Visit: Payer: Self-pay | Admitting: Internal Medicine

## 2016-11-28 DIAGNOSIS — C50412 Malignant neoplasm of upper-outer quadrant of left female breast: Secondary | ICD-10-CM

## 2016-12-01 DIAGNOSIS — J45991 Cough variant asthma: Secondary | ICD-10-CM | POA: Diagnosis not present

## 2016-12-01 DIAGNOSIS — E119 Type 2 diabetes mellitus without complications: Secondary | ICD-10-CM | POA: Diagnosis not present

## 2016-12-01 DIAGNOSIS — L500 Allergic urticaria: Secondary | ICD-10-CM | POA: Diagnosis not present

## 2016-12-01 DIAGNOSIS — R0602 Shortness of breath: Secondary | ICD-10-CM | POA: Diagnosis not present

## 2016-12-09 ENCOUNTER — Other Ambulatory Visit: Payer: Self-pay | Admitting: Internal Medicine

## 2016-12-09 DIAGNOSIS — C50412 Malignant neoplasm of upper-outer quadrant of left female breast: Secondary | ICD-10-CM

## 2016-12-11 DIAGNOSIS — S21009A Unspecified open wound of unspecified breast, initial encounter: Secondary | ICD-10-CM | POA: Insufficient documentation

## 2016-12-11 DIAGNOSIS — N61 Mastitis without abscess: Secondary | ICD-10-CM | POA: Insufficient documentation

## 2016-12-15 ENCOUNTER — Encounter: Payer: Self-pay | Admitting: Gastroenterology

## 2016-12-15 ENCOUNTER — Ambulatory Visit (INDEPENDENT_AMBULATORY_CARE_PROVIDER_SITE_OTHER): Payer: BLUE CROSS/BLUE SHIELD | Admitting: Gastroenterology

## 2016-12-15 VITALS — BP 128/76 | HR 91 | Temp 98.6°F | Ht 66.0 in | Wt 205.0 lb

## 2016-12-15 DIAGNOSIS — R748 Abnormal levels of other serum enzymes: Secondary | ICD-10-CM | POA: Diagnosis not present

## 2016-12-15 NOTE — Patient Instructions (Signed)
You are scheduled for an [place] tissue elastography at [facility] on Friday, August 10th at 8:30am. Please arrive at 8:15am and check in at the [facility] registration desk.  If you need to reschedule this appointment for any reason, please contact central scheduling at [phone].

## 2016-12-15 NOTE — Progress Notes (Signed)
Gastroenterology Consultation  Referring Provider:     Ronnell Freshwater, NP Primary Care Physician:  Lavera Guise, MD Primary Gastroenterologist:  Dr. Allen Norris     Reason for Consultation:     Abnormal liver enzymes        HPI:   Danielle Wallace is a 56 y.o. y/o female referred for consultation & management of Abnormal liver enzymes by Dr. Humphrey Rolls, Timoteo Gaul, MD.  This patient comes today with a history of abnormal liver enzymes.  The patient reports that she has not changed any of her behavior or medications.  She thinks she may have gained some weight.  The patient also reports that her biggest symptom is fatigue.  She states that she was able to travel around [place] with her last vacation but feels very exhausted and tired quickly now.  The patient denies any alcohol abuse. The patient had normal liver enzymes back in November 2016 and then started to have an increase in her ALT in February 2017.  Both the AST and ALT were abnormal in September 2017 and have increased last month to an AST of 107 and ALT of 115. The patient denies any unexplained weight loss, fevers, chills, nausea or vomiting. 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  Past Medical History:  Diagnosis Date  . Arthritis 2006  . Asthma   . Breast cancer ([place]) 10/14/2013   18 mm, T1c, N0; ER/ PR positive,her 2 neu overexpressed. Adjuvant chemo/ herceptin.  . Cancer [facility]) 2006   Renal cell carcinoma; cryosurgery treatment  . Collapsed lung 2007  . Diabetes mellitus without complication ([place]) 4193   Metformin  . Diffuse cystic mastopathy   . Personal history of chemotherapy   . Sinus problem     Past Surgical History:  Procedure Laterality Date  . BREAST BIOPSY Right 2005   negative  . BREAST BIOPSY Left 08/23/2007   negative  . BREAST BIOPSY Left 10/24/2013   positive  . BREAST BIOPSY Left 07/2002   neg    . BREAST BIOPSY Left 08/23/2007   neg  . BREAST SURGERY Left 10/2011   Cyst Aspirationapocrine metaplasia, ductal cells and bone cells, hypo-cellular  . BREAST SURGERY Left 2009   ADH on stereotactic biopsy, 1.5 mm focus.  Marland Kitchen BREAST SURGERY Left 2003   fibrocystic changes with ductal hyperplasia without atypia.  Marland Kitchen BREAST SURGERY Left    mastectomy  . BREAST SURGERY Left April 11, 2014   Removal of implant, debridement chest wall Dr.Coan  . CESAREAN SECTION  1991  . CHOLECYSTECTOMY  1990  . COLONOSCOPY  08/26/2013   Verdie Shire, M.D. normal.  . CRYOABLATION  2005, 2010  . CYST REMOVAL NECK  10/2011   Dr. Tami Ribas  . INCISION AND DRAINAGE Left Dec 2015, Feb 2016   Dr Tula Nakayama  . MASTECTOMY Left 11/24/2013   positive  . PORTACATH PLACEMENT  11/24/13    Prior to Admission medications   Medication Sig Start Date End Date Taking? Authorizing Provider  canagliflozin (INVOKANA) 300 MG TABS tablet TAKE 1 TABLET BY MOUTH EVERY DAY 11/10/14  Yes [provider]  etodolac (LODINE) 400 MG tablet TAKE 1 TABLET BY MOUTH 3 TIMES A DAY AS NEEDED FOR PAIN 02/15/15  Yes [provider]  gabapentin (NEURONTIN) 600 MG tablet Take 600 mg by mouth 3 (three) times daily. 03/11/15  Yes [provider]  imipramine (TOFRANIL) 25 MG tablet Take 25 mg by mouth at bedtime.   Yes [provider]  levocetirizine (XYZAL) 5 MG tablet Take 5 mg by mouth every evening.   Yes [provider]  linaclotide (LINZESS) 145 MCG CAPS capsule Take 145 mcg by mouth daily before breakfast.   Yes [provider]  metFORMIN (GLUCOPHAGE) 500 MG tablet Take 2,000 mg by mouth every evening.    Yes [provider]  tamoxifen (NOLVADEX) 20 MG tablet TAKE 1 TABLET (20 MG TOTAL) BY MOUTH DAILY. 11/28/16  Yes Cammie Sickle, MD  valsartan-hydrochlorothiazide (DIOVAN-HCT) 80-12.5 MG per tablet Take 1 tablet by mouth daily.   Yes [provider]  Calcium Carb-Cholecalciferol  (CALCIUM + D3) 600-200 MG-UNIT TABS Take 1 tablet by mouth 2 (two) times daily. Patient not taking: Reported on 11/27/2016 04/23/15   Forest Gleason, MD  glipiZIDE (GLUCOTROL XL) 5 MG 24 hr tablet TAKE 1 TABLET (5 MG TOTAL) BY MOUTH ONCE DAILY. TAKE WITH EVENING MEAL. 11/09/16   [provider]  venlafaxine XR (EFFEXOR-XR) 37.5 MG 24 hr capsule TAKE 1 CAPSULE (37.5 MG TOTAL) BY MOUTH DAILY WITH BREAKFAST. 11/09/16   [provider]    Family History  Problem Relation Age of Onset  . Liver cancer Father   . Diabetes Mother   . Hypertension Mother   . Breast cancer Paternal Aunt 56  . Ovarian cancer Maternal Grandmother      Social History  Substance Use Topics  . Smoking status: Never Smoker  . Smokeless tobacco: Never Used  . Alcohol use Yes     Comment: occasionally    Allergies as of 12/15/2016 - Review Complete 12/15/2016  Allergen Reaction Noted  . Exemestane Anaphylaxis 06/18/2015  . Levofloxacin Anaphylaxis, Diarrhea, Itching, Nausea Only, Other (See Comments), Palpitations, Shortness Of Breath, Swelling, and Tinitus   . Linezolid Other (See Comments) 09/25/2014  . Taxotere [docetaxel] Anaphylaxis 09/25/2014  . Floxin [ofloxacin] Other (See Comments) 10/12/2013  . Hydrocodone Itching 10/12/2013  . Sulfa antibiotics Other (See Comments) 10/12/2013  . Codeine Rash 08/09/2014  . Penicillins Rash 10/12/2013  . Vancomycin Rash 08/16/2014    Review of Systems:    All systems reviewed and negative except where noted in HPI.   Physical Exam:  BP 128/76   Pulse 91   Temp 98.6 F (37 C) (Oral)   Ht 5' 6"  (1.676 m)   Wt 205 lb (93 kg)   BMI 33.09 kg/m  No LMP recorded. Patient is postmenopausal. Psych:  Alert and cooperative. Normal mood and affect. General:   Alert,  Well-developed, well-nourished, pleasant and cooperative in NAD Head:  Normocephalic and atraumatic. Eyes:  Sclera clear, no icterus.   Conjunctiva pink. Ears:  Normal auditory acuity. Nose:   No deformity, discharge, or lesions. Mouth:  No deformity or lesions,oropharynx pink & moist. Neck:  Supple; no masses or thyromegaly. Lungs:  Respirations even and unlabored.  Clear throughout to auscultation.   No wheezes, crackles, or rhonchi. No acute distress. Heart:  Regular rate and rhythm; no murmurs, clicks, rubs, or gallops. Abdomen:  Normal bowel sounds.  No bruits.  Soft, non-tender and non-distended without masses, hepatosplenomegaly or hernias noted.  No guarding or rebound tenderness.  Negative Carnett sign.   Rectal:  Deferred.  Msk:  Symmetrical without gross deformities.  Good, equal movement & strength bilaterally. Pulses:  Normal pulses noted. Extremities:  No clubbing or edema.  No cyanosis. Neurologic:  Alert and  oriented x3;  grossly normal neurologically. Skin:  Intact without significant lesions or rashes.  No jaundice. Lymph Nodes:  No significant cervical adenopathy. Psych:  Alert and cooperative. Normal mood and affect.  Imaging Studies: No results found.  Assessment and Plan:   Danielle Wallace is a 56 y.o. y/o female who has abnormal liver enzymes since February 2017.  The patient does have diabetes and has gained weight.  The patient also has gastric varices on imaging. The patient will have her blood sent for other possible causes of abnormal liver enzymes.  The patient will also have a fibrosis scan done. She has been told to try and lose weight.  The patient is also concerned that she may have hemachromatosis because it runs in her family although her iron and ferritin have been normal. The patient will have her labs sent off for a hemochromatosis gene. The patient has been explained the plan and agrees with it.   Lucilla Lame, MD. Marval Regal   Note: This dictation was prepared with Dragon dictation along with smaller phrase technology. Any transcriptional errors that result from this process are unintentional.

## 2016-12-18 ENCOUNTER — Other Ambulatory Visit: Payer: Self-pay

## 2016-12-18 DIAGNOSIS — R748 Abnormal levels of other serum enzymes: Secondary | ICD-10-CM

## 2016-12-19 ENCOUNTER — Ambulatory Visit: Payer: BLUE CROSS/BLUE SHIELD

## 2016-12-19 ENCOUNTER — Other Ambulatory Visit: Payer: Self-pay | Admitting: Internal Medicine

## 2016-12-19 DIAGNOSIS — C50412 Malignant neoplasm of upper-outer quadrant of left female breast: Secondary | ICD-10-CM

## 2016-12-19 LAB — HEPATIC FUNCTION PANEL
ALK PHOS: 93 IU/L (ref 39–117)
ALT: 60 IU/L — AB (ref 0–32)
AST: 45 IU/L — AB (ref 0–40)
Albumin: 4.4 g/dL (ref 3.5–5.5)
BILIRUBIN, DIRECT: 0.19 mg/dL (ref 0.00–0.40)
Bilirubin Total: 0.5 mg/dL (ref 0.0–1.2)
Total Protein: 7 g/dL (ref 6.0–8.5)

## 2016-12-19 LAB — ANA: ANA: NEGATIVE

## 2016-12-19 LAB — HEPATITIS C ANTIBODY: Hep C Virus Ab: <0.1 s/co ratio (ref 0.0–0.9)

## 2016-12-19 LAB — HEMOCHROMATOSIS DNA-PCR(C282Y,H63D)

## 2016-12-19 LAB — MITOCHONDRIAL ANTIBODIES: MITOCHONDRIAL AB: 8.6 U (ref 0.0–20.0)

## 2016-12-19 LAB — ALPHA-1-ANTITRYPSIN: A1 ANTITRYPSIN: 90 mg/dL (ref 90–200)

## 2016-12-19 LAB — HEPATITIS A ANTIBODY, TOTAL: Hep A Total Ab: POSITIVE — AB

## 2016-12-19 LAB — HEPATITIS B SURFACE ANTIBODY,QUALITATIVE: HEP B SURFACE AB, QUAL: REACTIVE

## 2016-12-19 LAB — CERULOPLASMIN: Ceruloplasmin: 25.9 mg/dL (ref 19.0–39.0)

## 2016-12-19 LAB — HEPATITIS B SURFACE ANTIGEN: HEP B S AG: NEGATIVE

## 2016-12-19 LAB — ANTI-SMOOTH MUSCLE ANTIBODY, IGG: SMOOTH MUSCLE AB: 10 U (ref 0–19)

## 2016-12-24 DIAGNOSIS — R0602 Shortness of breath: Secondary | ICD-10-CM | POA: Diagnosis not present

## 2016-12-25 ENCOUNTER — Ambulatory Visit
Admission: RE | Admit: 2016-12-25 | Discharge: 2016-12-25 | Disposition: A | Discharge status: Home / Self Care | Payer: BLUE CROSS/BLUE SHIELD | Source: Ambulatory Visit | Attending: Gastroenterology | Admitting: Gastroenterology

## 2016-12-25 DIAGNOSIS — R748 Abnormal levels of other serum enzymes: Secondary | ICD-10-CM | POA: Diagnosis not present

## 2016-12-25 DIAGNOSIS — K760 Fatty (change of) liver, not elsewhere classified: Secondary | ICD-10-CM | POA: Insufficient documentation

## 2016-12-25 DIAGNOSIS — R945 Abnormal results of liver function studies: Secondary | ICD-10-CM | POA: Diagnosis not present

## 2016-12-26 ENCOUNTER — Telehealth: Payer: Self-pay

## 2016-12-26 NOTE — Telephone Encounter (Signed)
-----   Message from Lucilla Lame, MD sent at 12/25/2016 11:26 AM EDT ----- The patient know that her liver enzymes are down but not back to normal. 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 The ultrasound showed significant fibrosis and possible cirrhosis. She should come in to discuss possible further testing including a possible liver biopsy.

## 2016-12-26 NOTE — Telephone Encounter (Signed)
Pt notified of labs and [place] results. Pt has been scheduled for a follow up appt to discuss further options.

## 2017-01-08 ENCOUNTER — Encounter: Payer: Self-pay | Admitting: Gastroenterology

## 2017-01-08 ENCOUNTER — Ambulatory Visit (INDEPENDENT_AMBULATORY_CARE_PROVIDER_SITE_OTHER): Payer: BLUE CROSS/BLUE SHIELD | Admitting: Gastroenterology

## 2017-01-08 VITALS — BP 141/75 | HR 96 | Temp 96.8°F | Ht 66.0 in | Wt 203.0 lb

## 2017-01-08 DIAGNOSIS — K760 Fatty (change of) liver, not elsewhere classified: Secondary | ICD-10-CM | POA: Diagnosis not present

## 2017-01-08 NOTE — Progress Notes (Signed)
Primary Care Physician: Lavera Guise, MD  Primary Gastroenterologist:  Dr. Lucilla Lame  Chief Complaint  Patient presents with  . Follow-up    HPI: Danielle Wallace is a 56 y.o. female here For follow-up of her labs.  The patient was found to be a carrier for hemochromatosis gene but her iron and ferritin were normal.  The patient was found to have fatty liver with a elasticity test showing her to have a fibrosis score of F3 and F4.  The patient has had a decrease in her liver enzymes and states she has cut down on the amount of alcohol she drinks although she did not drink frequently.  The patient is also noted on weight watchers and trying to lose weight.  Current Outpatient Prescriptions  Medication Sig Dispense Refill  . Calcium Carb-Cholecalciferol (CALCIUM + D3) 600-200 MG-UNIT TABS Take 1 tablet by mouth 2 (two) times daily. 180 tablet 3  . canagliflozin (INVOKANA) 300 MG TABS tablet TAKE 1 TABLET BY MOUTH EVERY DAY    . etodolac (LODINE) 400 MG tablet TAKE 1 TABLET BY MOUTH 3 TIMES A DAY AS NEEDED FOR PAIN    . gabapentin (NEURONTIN) 600 MG tablet Take 600 mg by mouth 3 (three) times daily.  2  . imipramine (TOFRANIL) 25 MG tablet Take 25 mg by mouth at bedtime.    Marland Kitchen levocetirizine (XYZAL) 5 MG tablet Take 5 mg by mouth every evening.    . linaclotide (LINZESS) 145 MCG CAPS capsule Take 145 mcg by mouth daily before breakfast.    . metFORMIN (GLUCOPHAGE) 500 MG tablet Take 2,000 mg by mouth every evening.     . tamoxifen (NOLVADEX) 20 MG tablet TAKE 1 TABLET (20 MG TOTAL) BY MOUTH DAILY. 90 tablet 0  . valsartan-hydrochlorothiazide (DIOVAN-HCT) 80-12.5 MG per tablet Take 1 tablet by mouth daily.    Marland Kitchen venlafaxine XR (EFFEXOR-XR) 37.5 MG 24 hr capsule TAKE 1 CAPSULE (37.5 MG TOTAL) BY MOUTH DAILY WITH BREAKFAST.  3   No current facility-administered medications for this visit.     Allergies as of 01/08/2017 - Review Complete 01/08/2017  Allergen Reaction Noted  . Exemestane  Anaphylaxis 06/18/2015  . Levofloxacin Anaphylaxis, Diarrhea, Itching, Nausea Only, Other (See Comments), Palpitations, Shortness Of Breath, Swelling, and Tinitus   . Linezolid Other (See Comments) 09/25/2014  . Taxotere [docetaxel] Anaphylaxis 09/25/2014  . Floxin [ofloxacin] Other (See Comments) 10/12/2013  . Hydrocodone Itching 10/12/2013  . Sulfa antibiotics Other (See Comments) 10/12/2013  . Codeine Rash 08/09/2014  . Penicillins Rash 10/12/2013  . Vancomycin Rash 08/16/2014    ROS:  General: Negative for anorexia, weight loss, fever, chills, fatigue, weakness. ENT: Negative for hoarseness, difficulty swallowing , nasal congestion. CV: Negative for chest pain, angina, palpitations, dyspnea on exertion, peripheral edema.  Respiratory: Negative for dyspnea at rest, dyspnea on exertion, cough, sputum, wheezing.  GI: See history of present illness. GU:  Negative for dysuria, hematuria, urinary incontinence, urinary frequency, nocturnal urination.  Endo: Negative for unusual weight change.    Physical Examination:   BP (!) 141/75 (BP Location: Right Arm, Patient Position: Sitting, Cuff Size: Large)   Pulse 96   Temp (!) 96.8 F (36 C) (Oral)   Ht 5' 6"  (1.676 m)   Wt 203 lb (92.1 kg)   BMI 32.77 kg/m   General: Well-nourished, well-developed in no acute distress.  Eyes: No icterus. Conjunctivae pink. Mouth: Oropharyngeal mucosa moist and pink , no lesions erythema or exudate. Lungs: Clear to  auscultation bilaterally. Non-labored. Heart: Regular rate and rhythm, no murmurs rubs or gallops.  Abdomen: Bowel sounds are normal, nontender, nondistended, no hepatosplenomegaly or masses, no abdominal bruits or hernia , no rebound or guarding.   Extremities: No lower extremity edema. No clubbing or deformities. Neuro: Alert and oriented x 3.  Grossly intact. Skin: Warm and dry, no jaundice.   Psych: Alert and cooperative, normal mood and affect.  Labs:    Imaging Studies: US  Liver Elastography  Result Date: 12/25/2016 CLINICAL DATA:  Elevated LFTs EXAM: US ABDOMEN LIMITED - RIGHT UPPER QUADRANT ULTRASOUND HEPATIC ELASTOGRAPHY TECHNIQUE: Limited right upper quadrant abdominal ultrasound was performed. In addition, ultrasound elastography evaluation of the liver was performed. A region of interest was placed in the right lobe of the liver. Following application of a compressive sonographic pulse, shear waves were detected in the adjacent hepatic tissue and the shear wave velocity was calculated. Multiple assessments were performed at the selected site. Median shear wave velocity is correlated to a Metavir fibrosis score. COMPARISON:  Ultrasound 01/25/2016 FINDINGS: ULTRASOUND ABDOMEN LIMITED RIGHT UPPER QUADRANT Gallbladder: Prior cholecystectomy Common bile duct: Diameter: Normal caliber, 3 mm Liver: Increased echotexture compatible with fatty infiltration. No focal abnormality or biliary ductal dilatation. ULTRASOUND HEPATIC ELASTOGRAPHY Device: Siemens Helix VTQ Patient position: Supine Transducer 6C1 Number of measurements: 10 Hepatic segment:  8 Median velocity:   3.55  m/sec IQR: 0.17 IQR/Median velocity ratio: 0.05 Corresponding Metavir fibrosis score:  Some F3 + F4 Risk of fibrosis: High Limitations of exam: None Pertinent findings noted on other imaging exams:  None Please note that abnormal shear wave velocities may also be identified in clinical settings other than with hepatic fibrosis, such as: acute hepatitis, elevated right heart and central venous pressures including use of beta blockers, veno-occlusive disease (Budd-Chiari), infiltrative processes such as mastocytosis/amyloidosis/infiltrative tumor, extrahepatic cholestasis, in the post-prandial state, and liver transplantation. Correlation with patient history, laboratory data, and clinical condition recommended. IMPRESSION: ULTRASOUND ABDOMEN: Fatty infiltration of the liver. ULTRASOUND HEPATIC ELASTOGRAHY: Median  hepatic shear wave velocity is calculated at 3.55 m/sec. Corresponding Metavir fibrosis score is  Some F3 + F4. Risk of fibrosis is High. Follow-up: Follow up advised Electronically Signed   By: Rolm Baptise M.D.   On: 12/25/2016 08:53   US Abdomen Limited Ruq  Result Date: 12/25/2016 CLINICAL DATA:  Elevated LFTs EXAM: US ABDOMEN LIMITED - RIGHT UPPER QUADRANT ULTRASOUND HEPATIC ELASTOGRAPHY TECHNIQUE: Limited right upper quadrant abdominal ultrasound was performed. In addition, ultrasound elastography evaluation of the liver was performed. A region of interest was placed in the right lobe of the liver. Following application of a compressive sonographic pulse, shear waves were detected in the adjacent hepatic tissue and the shear wave velocity was calculated. Multiple assessments were performed at the selected site. Median shear wave velocity is correlated to a Metavir fibrosis score. COMPARISON:  Ultrasound 01/25/2016 FINDINGS: ULTRASOUND ABDOMEN LIMITED RIGHT UPPER QUADRANT Gallbladder: Prior cholecystectomy Common bile duct: Diameter: Normal caliber, 3 mm Liver: Increased echotexture compatible with fatty infiltration. No focal abnormality or biliary ductal dilatation. ULTRASOUND HEPATIC ELASTOGRAPHY Device: Siemens Helix VTQ Patient position: Supine Transducer 6C1 Number of measurements: 10 Hepatic segment:  8 Median velocity:   3.55  m/sec IQR: 0.17 IQR/Median velocity ratio: 0.05 Corresponding Metavir fibrosis score:  Some F3 + F4 Risk of fibrosis: High Limitations of exam: None Pertinent findings noted on other imaging exams:  None Please note that abnormal shear wave velocities may also be identified in clinical settings other than  with hepatic fibrosis, such as: acute hepatitis, elevated right heart and central venous pressures including use of beta blockers, veno-occlusive disease (Budd-Chiari), infiltrative processes such as mastocytosis/amyloidosis/infiltrative tumor, extrahepatic cholestasis, in  the post-prandial state, and liver transplantation. Correlation with patient history, laboratory data, and clinical condition recommended. IMPRESSION: ULTRASOUND ABDOMEN: Fatty infiltration of the liver. ULTRASOUND HEPATIC ELASTOGRAHY: Median hepatic shear wave velocity is calculated at 3.55 m/sec. Corresponding Metavir fibrosis score is  Some F3 + F4. Risk of fibrosis is High. Follow-up: Follow up advised Electronically Signed   By: Rolm Baptise M.D.   On: 12/25/2016 08:53    Assessment and Plan:   Danielle Wallace is a 56 y.o. y/o female With abnormal liver enzymes and what appears to be cirrhosis and possible gastric varices.  The patient likely has these as a result of fatty liver.  The patient will continue a weight loss program and will also avoid alcohol.  She will follow up in 3 months for repeat of her liver enzymes and to check the progress of her weight loss.  The patient will also be set up for an EGD to look for Esophageal varices.    Lucilla Lame, MD. Marval Regal   Note: This dictation was prepared with Dragon dictation along with smaller phrase technology. Any transcriptional errors that result from this process are unintentional.

## 2017-01-09 ENCOUNTER — Other Ambulatory Visit: Payer: Self-pay

## 2017-01-09 DIAGNOSIS — K760 Fatty (change of) liver, not elsewhere classified: Secondary | ICD-10-CM

## 2017-01-13 ENCOUNTER — Encounter: Payer: Self-pay | Admitting: *Deleted

## 2017-01-15 DIAGNOSIS — R0602 Shortness of breath: Secondary | ICD-10-CM | POA: Diagnosis not present

## 2017-01-15 DIAGNOSIS — L500 Allergic urticaria: Secondary | ICD-10-CM | POA: Diagnosis not present

## 2017-01-15 DIAGNOSIS — J45991 Cough variant asthma: Secondary | ICD-10-CM | POA: Diagnosis not present

## 2017-01-16 ENCOUNTER — Ambulatory Visit: Payer: BLUE CROSS/BLUE SHIELD | Admitting: Anesthesiology

## 2017-01-16 ENCOUNTER — Encounter
Admission: RE | Disposition: A | Discharge status: Home / Self Care | Payer: Self-pay | Source: Ambulatory Visit | Attending: Gastroenterology

## 2017-01-16 ENCOUNTER — Ambulatory Visit
Admission: RE | Admit: 2017-01-16 | Discharge: 2017-01-16 | Disposition: A | Discharge status: Home / Self Care | Payer: BLUE CROSS/BLUE SHIELD | Source: Ambulatory Visit | Attending: Gastroenterology | Admitting: Gastroenterology

## 2017-01-16 DIAGNOSIS — J45909 Unspecified asthma, uncomplicated: Secondary | ICD-10-CM | POA: Insufficient documentation

## 2017-01-16 DIAGNOSIS — Z888 Allergy status to other drugs, medicaments and biological substances status: Secondary | ICD-10-CM | POA: Diagnosis not present

## 2017-01-16 DIAGNOSIS — Z882 Allergy status to sulfonamides status: Secondary | ICD-10-CM | POA: Insufficient documentation

## 2017-01-16 DIAGNOSIS — Z853 Personal history of malignant neoplasm of breast: Secondary | ICD-10-CM | POA: Insufficient documentation

## 2017-01-16 DIAGNOSIS — Z885 Allergy status to narcotic agent status: Secondary | ICD-10-CM | POA: Insufficient documentation

## 2017-01-16 DIAGNOSIS — Z7984 Long term (current) use of oral hypoglycemic drugs: Secondary | ICD-10-CM | POA: Diagnosis not present

## 2017-01-16 DIAGNOSIS — E119 Type 2 diabetes mellitus without complications: Secondary | ICD-10-CM | POA: Diagnosis not present

## 2017-01-16 DIAGNOSIS — Z79899 Other long term (current) drug therapy: Secondary | ICD-10-CM | POA: Insufficient documentation

## 2017-01-16 DIAGNOSIS — Z85528 Personal history of other malignant neoplasm of kidney: Secondary | ICD-10-CM | POA: Insufficient documentation

## 2017-01-16 DIAGNOSIS — Z881 Allergy status to other antibiotic agents status: Secondary | ICD-10-CM | POA: Insufficient documentation

## 2017-01-16 DIAGNOSIS — K760 Fatty (change of) liver, not elsewhere classified: Secondary | ICD-10-CM

## 2017-01-16 DIAGNOSIS — I864 Gastric varices: Secondary | ICD-10-CM

## 2017-01-16 DIAGNOSIS — Z800 Family history of malignant neoplasm of digestive organs: Secondary | ICD-10-CM | POA: Insufficient documentation

## 2017-01-16 DIAGNOSIS — K7460 Unspecified cirrhosis of liver: Secondary | ICD-10-CM | POA: Insufficient documentation

## 2017-01-16 DIAGNOSIS — Z9221 Personal history of antineoplastic chemotherapy: Secondary | ICD-10-CM | POA: Diagnosis not present

## 2017-01-16 DIAGNOSIS — Z880 Allergy status to penicillin: Secondary | ICD-10-CM | POA: Insufficient documentation

## 2017-01-16 DIAGNOSIS — R933 Abnormal findings on diagnostic imaging of other parts of digestive tract: Secondary | ICD-10-CM | POA: Diagnosis not present

## 2017-01-16 HISTORY — DX: Reserved for inherently not codable concepts without codable children: IMO0001

## 2017-01-16 HISTORY — DX: Motion sickness, initial encounter: T75.3XXA

## 2017-01-16 HISTORY — DX: Encounter for fitting and adjustment of orthodontic device: Z46.4

## 2017-01-16 HISTORY — DX: Unspecified cirrhosis of liver: K74.60

## 2017-01-16 HISTORY — PX: ESOPHAGOGASTRODUODENOSCOPY (EGD) WITH PROPOFOL: SHX5813

## 2017-01-16 LAB — GLUCOSE, CAPILLARY
Glucose-Capillary: 143 mg/dL — ABNORMAL HIGH (ref 65–99)
Glucose-Capillary: 129 mg/dL — ABNORMAL HIGH (ref 65–99)

## 2017-01-16 SURGERY — ESOPHAGOGASTRODUODENOSCOPY (EGD) WITH PROPOFOL
Anesthesia: General | Wound class: Clean Contaminated

## 2017-01-16 MED ORDER — ACETAMINOPHEN 160 MG/5ML PO SOLN: 325.0000 mg | ORAL | Status: DC | PRN

## 2017-01-16 MED ORDER — PROPOFOL 10 MG/ML IV BOLUS
INTRAVENOUS | Status: DC | PRN
  Administered 2017-01-16: 80 mg via INTRAVENOUS
  Administered 2017-01-16: 20 mg via INTRAVENOUS

## 2017-01-16 MED ORDER — GLYCOPYRROLATE 0.2 MG/ML IJ SOLN
INTRAMUSCULAR | Status: DC | PRN
  Administered 2017-01-16: 0.2 mg via INTRAVENOUS

## 2017-01-16 MED ORDER — SODIUM CHLORIDE 0.9 % IV SOLN: INTRAVENOUS | Status: DC

## 2017-01-16 MED ORDER — LIDOCAINE HCL (CARDIAC) 20 MG/ML IV SOLN
INTRAVENOUS | Status: DC | PRN
  Administered 2017-01-16: 50 mg via INTRAVENOUS

## 2017-01-16 MED ORDER — NADOLOL 40 MG PO TABS: 40.0000 mg | ORAL_TABLET | Freq: Every day | ORAL | 11 refills | Status: DC

## 2017-01-16 MED ORDER — LACTATED RINGERS IV SOLN
10.0000 mL/h | INTRAVENOUS | Status: DC
  Administered 2017-01-16: 10 mL/h via INTRAVENOUS

## 2017-01-16 MED ORDER — ACETAMINOPHEN 325 MG PO TABS: 325.0000 mg | ORAL_TABLET | ORAL | Status: DC | PRN

## 2017-01-16 MED ORDER — MEPERIDINE HCL 25 MG/ML IJ SOLN: 6.2500 mg | INTRAMUSCULAR | Status: DC | PRN

## 2017-01-16 MED ORDER — PROMETHAZINE HCL 25 MG/ML IJ SOLN
6.2500 mg | INTRAMUSCULAR | Status: DC | PRN
Start: 2017-01-16 — End: 2017-01-16

## 2017-01-16 MED ORDER — FENTANYL CITRATE (PF) 100 MCG/2ML IJ SOLN: 25.0000 ug | INTRAMUSCULAR | Status: DC | PRN

## 2017-01-16 SURGICAL SUPPLY — 32 items
BALLN DILATOR 10-12 8 (BALLOONS)
BALLN DILATOR 12-15 8 (BALLOONS)
BALLN DILATOR 15-18 8 (BALLOONS)
BALLN DILATOR CRE 0-12 8 (BALLOONS)
BALLN DILATOR ESOPH 8 10 CRE (MISCELLANEOUS) IMPLANT
BALLOON DILATOR 12-15 8 (BALLOONS) IMPLANT
BALLOON DILATOR 15-18 8 (BALLOONS) IMPLANT
BALLOON DILATOR CRE 0-12 8 (BALLOONS) IMPLANT
BLOCK BITE 60FR ADLT L/F GRN (MISCELLANEOUS) ×2 IMPLANT
CANISTER SUCT 1200ML W/VALVE (MISCELLANEOUS) ×2 IMPLANT
CLIP HMST 235XBRD CATH ROT (MISCELLANEOUS) IMPLANT
CLIP RESOLUTION 360 11X235 (MISCELLANEOUS)
FCP ESCP3.2XJMB 240X2.8X (MISCELLANEOUS)
FORCEPS BIOP RAD 4 LRG CAP 4 (CUTTING FORCEPS) IMPLANT
FORCEPS BIOP RJ4 240 W/NDL (MISCELLANEOUS)
FORCEPS ESCP3.2XJMB 240X2.8X (MISCELLANEOUS) IMPLANT
GOWN CVR UNV OPN BCK APRN NK (MISCELLANEOUS) ×2 IMPLANT
GOWN ISOL THUMB LOOP REG UNIV (MISCELLANEOUS) ×4
INJECTOR VARIJECT VIN23 (MISCELLANEOUS) IMPLANT
KIT DEFENDO VALVE AND CONN (KITS) IMPLANT
KIT ENDO PROCEDURE OLY (KITS) ×2 IMPLANT
MARKER SPOT ENDO TATTOO 5ML (MISCELLANEOUS) IMPLANT
PAD GROUND ADULT SPLIT (MISCELLANEOUS) IMPLANT
RETRIEVER NET PLAT FOOD (MISCELLANEOUS) IMPLANT
SNARE SHORT THROW 13M SML OVAL (MISCELLANEOUS) IMPLANT
SNARE SHORT THROW 30M LRG OVAL (MISCELLANEOUS) IMPLANT
SPOT EX ENDOSCOPIC TATTOO (MISCELLANEOUS)
SYR INFLATION 60ML (SYRINGE) IMPLANT
TRAP ETRAP POLY (MISCELLANEOUS) IMPLANT
VARIJECT INJECTOR VIN23 (MISCELLANEOUS)
WATER STERILE IRR 250ML POUR (IV SOLUTION) ×2 IMPLANT
WIRE CRE 18-20MM 8CM F G (MISCELLANEOUS) IMPLANT

## 2017-01-16 NOTE — Op Note (Signed)
[facility] Gastroenterology Patient Name: Danielle Wallace Procedure Date: 01/16/2017 7:21 AM MRN: [id number] Account #: [id number] Date of Birth: [birth year] Admit Type: Outpatient Age: 56 Room: [facility] OR ROOM 01 Gender: Female Note Status: Finalized Procedure:            Upper GI endoscopy Indications:          Abnormal CT of the GI tract Providers:            Lucilla Lame MD, MD Referring MD:         Lavera Guise, MD (Referring MD) Medicines:            Propofol per Anesthesia Complications:        No immediate complications. Procedure:            Pre-Anesthesia Assessment:                       - Prior to the procedure, a History and Physical was                        performed, and patient medications and allergies were                        reviewed. The patient's tolerance of previous                        anesthesia was also reviewed. The risks and benefits of                        the procedure and the sedation options and risks were                        discussed with the patient. All questions were                        answered, and informed consent was obtained. Prior                        Anticoagulants: The patient has taken no previous                        anticoagulant or antiplatelet agents. ASA Grade                        Assessment: II - A patient with mild systemic disease.                        After reviewing the risks and benefits, the patient was                        deemed in satisfactory condition to undergo the                        procedure.                       After obtaining informed consent, the endoscope was                        passed under direct vision. Throughout the procedure,  the patient's blood pressure, pulse, and oxygen                        saturations were monitored continuously. The Olympus                        GIF H180J Endoscope (S#: [id number]) was introduced      through the mouth, and advanced to the second part of                        duodenum. The upper GI endoscopy was accomplished                        without difficulty. The patient tolerated the procedure                        well. Findings:      The examined esophagus was normal.      Type 1 isolated gastric varices (IGV1, varices located in the fundus)       with no bleeding were found in the gastric fundus. There were no       stigmata of recent bleeding.      The examined duodenum was normal. Impression:           - Normal esophagus.                       - Type 1 isolated gastric varices (IGV1, varices                        located in the fundus), without bleeding.                       - Normal examined duodenum.                       - No specimens collected. Recommendation:       - Resume previous diet.                       - Nadolol 23m qd                       - Repeat upper endoscopy in 2 years for surveillance. Procedure Code(s):    --- Professional ---                       [id number] Esophagogastroduodenoscopy, flexible, transoral;                        diagnostic, including collection of specimen(s) by                        brushing or washing, when performed (separate procedure) Diagnosis Code(s):    --- Professional ---                       R93.3, Abnormal findings on diagnostic imaging of other                        parts of digestive tract  I86.4, Gastric varices CPT copyright 2016 American Medical Association. All rights reserved. The codes documented in this report are preliminary and upon coder review may  be revised to meet current compliance requirements. Lucilla Lame MD, MD 01/16/2017 8:17:20 AM This report has been signed electronically. Number of Addenda: 0 Note Initiated On: 01/16/2017 7:21 AM      [facility]

## 2017-01-16 NOTE — Transfer of Care (Signed)
Immediate Anesthesia Transfer of Care Note  Patient: Danielle Wallace  Procedure(s) Performed: Procedure(s) with comments: ESOPHAGOGASTRODUODENOSCOPY (EGD) WITH PROPOFOL (N/A) - Diabetic - oral meds  Patient Location: PACU  Anesthesia Type: General  Level of Consciousness: awake, alert  and patient cooperative  Airway and Oxygen Therapy: Patient Spontanous Breathing and Patient connected to supplemental oxygen  Post-op Assessment: Post-op Vital signs reviewed, Patient's Cardiovascular Status Stable, Respiratory Function Stable, Patent Airway and No signs of Nausea or vomiting  Post-op Vital Signs: Reviewed and stable  Complications: No apparent anesthesia complications

## 2017-01-16 NOTE — H&P (Signed)
Lucilla Lame, MD [address] [place], [place] [address] Phone:[phone] Fax : [phone]  Primary Care Physician:  Lavera Guise, MD Primary Gastroenterologist:  Dr. Allen Norris  Pre-Procedure History & Physical: HPI:  Danielle Wallace is a 56 y.o. female is here for an endoscopy.   Past Medical History:  Diagnosis Date  . Arthritis 2006  . Asthma   . Breast cancer ([place]) 10/14/2013   18 mm, T1c, N0; ER/ PR positive,her 2 neu overexpressed. Adjuvant chemo/ herceptin.  . Cancer [facility]) 2006   Renal cell carcinoma; cryosurgery treatment, right side  . Cirrhosis ([place])   . Collapsed lung 2007  . Diabetes mellitus without complication ([facility]) 0174   Metformin  . Diffuse cystic mastopathy   . Motion sickness    any moving vehicle  . Orthodontics    braces  . Personal history of chemotherapy   . Sinus problem     Past Surgical History:  Procedure Laterality Date  . BREAST BIOPSY Right 2005   negative  . BREAST BIOPSY Left 08/23/2007   negative  . BREAST BIOPSY Left 10/24/2013   positive  . BREAST BIOPSY Left 07/2002   neg  . BREAST BIOPSY Left 08/23/2007   neg  . BREAST SURGERY Left 10/2011   Cyst Aspirationapocrine metaplasia, ductal cells and bone cells, hypo-cellular  . BREAST SURGERY Left 2009   ADH on stereotactic biopsy, 1.5 mm focus.  Marland Kitchen BREAST SURGERY Left 2003   fibrocystic changes with ductal hyperplasia without atypia.  Marland Kitchen BREAST SURGERY Left    mastectomy  . BREAST SURGERY Left April 11, 2014   Removal of implant, debridement chest wall Dr.Coan  . CESAREAN SECTION  1991  . CHOLECYSTECTOMY  1990  . COLONOSCOPY  08/26/2013   Verdie Shire, M.D. normal.  . CRYOABLATION  2005, 2010  . CYST REMOVAL NECK  10/2011   Dr. Tami Ribas  . INCISION AND DRAINAGE Left Dec 2015, Feb 2016   Dr Tula Nakayama  . MASTECTOMY Left 11/24/2013   positive  . PORTACATH PLACEMENT  11/24/13    Prior to Admission medications   Medication Sig Start Date End Date Taking? Authorizing  Provider  canagliflozin (INVOKANA) 300 MG TABS tablet TAKE 1 TABLET BY MOUTH EVERY DAY 11/10/14  Yes [provider]  fluticasone (FLONASE) 50 MCG/ACT nasal spray Place into both nostrils daily.   Yes [provider]  imipramine (TOFRANIL) 25 MG tablet Take 25 mg by mouth at bedtime.   Yes [provider]  levocetirizine (XYZAL) 5 MG tablet Take 5 mg by mouth every evening.   Yes [provider]  linaclotide (LINZESS) 145 MCG CAPS capsule Take 145 mcg by mouth daily before breakfast.   Yes [provider]  metFORMIN (GLUCOPHAGE) 500 MG tablet Take 2,000 mg by mouth every evening.    Yes [provider]  montelukast (SINGULAIR) 10 MG tablet Take 10 mg by mouth at bedtime.   Yes [provider]  tamoxifen (NOLVADEX) 20 MG tablet TAKE 1 TABLET (20 MG TOTAL) BY MOUTH DAILY. 11/28/16  Yes Cammie Sickle, MD  valsartan-hydrochlorothiazide (DIOVAN-HCT) 80-12.5 MG per tablet Take 1 tablet by mouth daily.   Yes [provider]  venlafaxine XR (EFFEXOR-XR) 37.5 MG 24 hr capsule TAKE 1 CAPSULE (37.5 MG TOTAL) BY MOUTH DAILY WITH BREAKFAST. 11/09/16  Yes [provider]  Calcium Carb-Cholecalciferol (CALCIUM + D3) 600-200 MG-UNIT TABS Take 1 tablet by mouth 2 (two) times daily. Patient not taking: Reported on 01/13/2017 04/23/15  Forest Gleason, MD  etodolac (LODINE) 400 MG tablet TAKE 1 TABLET BY MOUTH 3 TIMES A DAY AS NEEDED FOR PAIN 02/15/15   [provider]  gabapentin (NEURONTIN) 600 MG tablet Take 600 mg by mouth 3 (three) times daily. 03/11/15   [provider]    Allergies as of 01/09/2017 - Review Complete 01/08/2017  Allergen Reaction Noted  . Exemestane Anaphylaxis 06/18/2015  . Levofloxacin Anaphylaxis, Diarrhea, Itching, Nausea Only, Other (See Comments), Palpitations, Shortness Of Breath, Swelling, and Tinitus   . Linezolid Other (See Comments) 09/25/2014  . Taxotere [docetaxel] Anaphylaxis  09/25/2014  . Floxin [ofloxacin] Other (See Comments) 10/12/2013  . Hydrocodone Itching 10/12/2013  . Sulfa antibiotics Other (See Comments) 10/12/2013  . Codeine Rash 08/09/2014  . Penicillins Rash 10/12/2013  . Vancomycin Rash 08/16/2014    Family History  Problem Relation Age of Onset  . Liver cancer Father   . Diabetes Mother   . Hypertension Mother   . Breast cancer Paternal Aunt 67  . Ovarian cancer Maternal Grandmother     Social History   Social History  . Marital status: Married    Spouse name: N/A  . Number of children: N/A  . Years of education: N/A   Occupational History  . Not on file.   Social History Main Topics  . Smoking status: Never Smoker  . Smokeless tobacco: Never Used  . Alcohol use Yes     Comment: occasionally, none recently  . Drug use: No  . Sexual activity: Not on file   Other Topics Concern  . Not on file   Social History Narrative  . No narrative on file    Review of Systems: See HPI, otherwise negative ROS  Physical Exam: BP 123/79   Pulse 93   Temp 97.7 F (36.5 C) (Temporal)   Resp 16   Ht 5' 6"  (1.676 m)   Wt 199 lb (90.3 kg)   SpO2 94%   BMI 32.12 kg/m  General:   Alert,  pleasant and cooperative in NAD Head:  Normocephalic and atraumatic. Neck:  Supple; no masses or thyromegaly. Lungs:  Clear throughout to auscultation.    Heart:  Regular rate and rhythm. Abdomen:  Soft, nontender and nondistended. Normal bowel sounds, without guarding, and without rebound.   Neurologic:  Alert and  oriented x4;  grossly normal neurologically.  Impression/Plan: Danielle Wallace is here for an endoscopy to be performed for cirrhosis  Risks, benefits, limitations, and alternatives regarding  endoscopy have been reviewed with the patient.  Questions have been answered.  All parties agreeable.   Lucilla Lame, MD  01/16/2017, 7:57 AM

## 2017-01-16 NOTE — Anesthesia Preprocedure Evaluation (Signed)
Anesthesia Evaluation  Patient identified by MRN, date of birth, ID band Patient awake    Reviewed: Allergy & Precautions, H&P , NPO status , Patient's Chart, lab work & pertinent test results, reviewed documented beta blocker date and time   Airway Mallampati: II  TM Distance: >3 FB Neck ROM: full    Dental no notable dental hx.    Pulmonary asthma ,    Pulmonary exam normal breath sounds clear to auscultation       Cardiovascular Exercise Tolerance: Good negative cardio ROS Normal cardiovascular exam Rhythm:regular Rate:Normal     Neuro/Psych negative neurological ROS  negative psych ROS   GI/Hepatic negative GI ROS, Neg liver ROS,   Endo/Other  diabetes, Type 2, Oral Hypoglycemic Agents  Renal/GU Renal diseaseHx renal CA  negative genitourinary   Musculoskeletal   Abdominal   Peds  Hematology negative hematology ROS (+)   Anesthesia Other Findings   Reproductive/Obstetrics negative OB ROS                             Anesthesia Physical Anesthesia Plan  ASA: II  Anesthesia Plan: General   Post-op Pain Management:    Induction:   PONV Risk Score and Plan:   Airway Management Planned:   Additional Equipment:   Intra-op Plan:   Post-operative Plan:   Informed Consent: I have reviewed the patients History and Physical, chart, labs and discussed the procedure including the risks, benefits and alternatives for the proposed anesthesia with the patient or authorized representative who has indicated his/her understanding and acceptance.   Dental Advisory Given  Plan Discussed with: CRNA and Anesthesiologist  Anesthesia Plan Comments:         Anesthesia Quick Evaluation

## 2017-01-16 NOTE — Anesthesia Procedure Notes (Signed)
Performed by: Mayme Genta Pre-anesthesia Checklist: Patient identified, Emergency Drugs available, Suction available, Timeout performed and Patient being monitored Patient Re-evaluated:Patient Re-evaluated prior to induction Oxygen Delivery Method: Nasal cannula Placement Confirmation: positive ETCO2

## 2017-01-16 NOTE — Anesthesia Postprocedure Evaluation (Signed)
Anesthesia Post Note  Patient: ALEEYAH BENSEN  Procedure(s) Performed: Procedure(s) (LRB): ESOPHAGOGASTRODUODENOSCOPY (EGD) WITH PROPOFOL (N/A)  Patient location during evaluation: PACU Anesthesia Type: General Level of consciousness: awake and alert Pain management: pain level controlled Vital Signs Assessment: post-procedure vital signs reviewed and stable Respiratory status: spontaneous breathing, nonlabored ventilation, respiratory function stable and patient connected to nasal cannula oxygen Cardiovascular status: blood pressure returned to baseline and stable Postop Assessment: no signs of nausea or vomiting Anesthetic complications: no    Trecia Rogers

## 2017-01-16 NOTE — Discharge Instructions (Signed)
General Anesthesia, Adult, Care After °These instructions provide you with information about caring for yourself after your procedure. Your health care provider may also give you more specific instructions. Your treatment has been planned according to current medical practices, but problems sometimes occur. Call your health care provider if you have any problems or questions after your procedure. °What can I expect after the procedure? °After the procedure, it is common to have: °· Vomiting. °· A sore throat. °· Mental slowness. ° °It is common to feel: °· Nauseous. °· Cold or shivery. °· Sleepy. °· Tired. °· Sore or achy, even in parts of your body where you did not have surgery. ° °Follow these instructions at home: °For at least 24 hours after the procedure: °· Do not: °? Participate in activities where you could fall or become injured. °? Drive. °? Use heavy machinery. °? Drink alcohol. °? Take sleeping pills or medicines that cause drowsiness. °? Make important decisions or sign legal documents. °? Take care of children on your own. °· Rest. °Eating and drinking °· If you vomit, drink water, juice, or soup when you can drink without vomiting. °· Drink enough fluid to keep your urine clear or pale yellow. °· Make sure you have little or no nausea before eating solid foods. °· Follow the diet recommended by your health care provider. °General instructions °· Have a responsible adult stay with you until you are awake and alert. °· Return to your normal activities as told by your health care provider. Ask your health care provider what activities are safe for you. °· Take over-the-counter and prescription medicines only as told by your health care provider. °· If you smoke, do not smoke without supervision. °· Keep all follow-up visits as told by your health care provider. This is important. °Contact a health care provider if: °· You continue to have nausea or vomiting at home, and medicines are not helpful. °· You  cannot drink fluids or start eating again. °· You cannot urinate after 8-12 hours. °· You develop a skin rash. °· You have fever. °· You have increasing redness at the site of your procedure. °Get help right away if: °· You have difficulty breathing. °· You have chest pain. °· You have unexpected bleeding. °· You feel that you are having a life-threatening or urgent problem. °This information is not intended to replace advice given to you by your health care provider. Make sure you discuss any questions you have with your health care provider. °Document Released: 08/04/2000 Document Revised: 10/01/2015 Document Reviewed: 04/12/2015 °Elsevier Interactive Patient Education © 2018 [employer] Inc[employer] ° °

## 2017-01-26 DIAGNOSIS — R0602 Shortness of breath: Secondary | ICD-10-CM | POA: Diagnosis not present

## 2017-01-30 DIAGNOSIS — E119 Type 2 diabetes mellitus without complications: Secondary | ICD-10-CM | POA: Diagnosis not present

## 2017-01-30 DIAGNOSIS — E663 Overweight: Secondary | ICD-10-CM | POA: Diagnosis not present

## 2017-01-30 DIAGNOSIS — I10 Essential (primary) hypertension: Secondary | ICD-10-CM | POA: Diagnosis not present

## 2017-01-30 DIAGNOSIS — R5383 Other fatigue: Secondary | ICD-10-CM | POA: Diagnosis not present

## 2017-02-12 DIAGNOSIS — L500 Allergic urticaria: Secondary | ICD-10-CM | POA: Diagnosis not present

## 2017-03-02 DIAGNOSIS — I10 Essential (primary) hypertension: Secondary | ICD-10-CM | POA: Diagnosis not present

## 2017-03-02 DIAGNOSIS — J45991 Cough variant asthma: Secondary | ICD-10-CM | POA: Diagnosis not present

## 2017-03-02 DIAGNOSIS — J019 Acute sinusitis, unspecified: Secondary | ICD-10-CM | POA: Diagnosis not present

## 2017-03-02 DIAGNOSIS — I864 Gastric varices: Secondary | ICD-10-CM | POA: Diagnosis not present

## 2017-03-12 DIAGNOSIS — J209 Acute bronchitis, unspecified: Secondary | ICD-10-CM | POA: Diagnosis not present

## 2017-03-12 DIAGNOSIS — J45991 Cough variant asthma: Secondary | ICD-10-CM | POA: Diagnosis not present

## 2017-03-19 ENCOUNTER — Other Ambulatory Visit: Payer: Self-pay | Admitting: Internal Medicine

## 2017-03-19 DIAGNOSIS — C50412 Malignant neoplasm of upper-outer quadrant of left female breast: Secondary | ICD-10-CM

## 2017-03-24 DIAGNOSIS — J45991 Cough variant asthma: Secondary | ICD-10-CM | POA: Diagnosis not present

## 2017-03-24 DIAGNOSIS — J029 Acute pharyngitis, unspecified: Secondary | ICD-10-CM | POA: Diagnosis not present

## 2017-03-24 DIAGNOSIS — J209 Acute bronchitis, unspecified: Secondary | ICD-10-CM | POA: Diagnosis not present

## 2017-03-24 DIAGNOSIS — E119 Type 2 diabetes mellitus without complications: Secondary | ICD-10-CM | POA: Diagnosis not present

## 2017-03-31 DIAGNOSIS — I888 Other nonspecific lymphadenitis: Secondary | ICD-10-CM | POA: Diagnosis not present

## 2017-03-31 DIAGNOSIS — J069 Acute upper respiratory infection, unspecified: Secondary | ICD-10-CM | POA: Diagnosis not present

## 2017-03-31 DIAGNOSIS — J029 Acute pharyngitis, unspecified: Secondary | ICD-10-CM | POA: Diagnosis not present

## 2017-03-31 DIAGNOSIS — E119 Type 2 diabetes mellitus without complications: Secondary | ICD-10-CM | POA: Diagnosis not present

## 2017-04-01 DIAGNOSIS — I888 Other nonspecific lymphadenitis: Secondary | ICD-10-CM | POA: Diagnosis not present

## 2017-04-01 DIAGNOSIS — J029 Acute pharyngitis, unspecified: Secondary | ICD-10-CM | POA: Diagnosis not present

## 2017-04-15 DIAGNOSIS — J029 Acute pharyngitis, unspecified: Secondary | ICD-10-CM | POA: Diagnosis not present

## 2017-04-15 DIAGNOSIS — I888 Other nonspecific lymphadenitis: Secondary | ICD-10-CM | POA: Diagnosis not present

## 2017-04-15 DIAGNOSIS — B2789 Other infectious mononucleosis with other complication: Secondary | ICD-10-CM | POA: Diagnosis not present

## 2017-05-14 ENCOUNTER — Ambulatory Visit: Payer: Self-pay | Admitting: Internal Medicine

## 2017-05-18 ENCOUNTER — Encounter: Payer: Self-pay | Admitting: Nurse Practitioner

## 2017-05-18 ENCOUNTER — Ambulatory Visit: Payer: BLUE CROSS/BLUE SHIELD | Admitting: Nurse Practitioner

## 2017-05-18 VITALS — BP 129/78 | HR 84 | Resp 16 | Wt 204.2 lb

## 2017-05-18 DIAGNOSIS — B2790 Infectious mononucleosis, unspecified without complication: Secondary | ICD-10-CM | POA: Diagnosis not present

## 2017-05-18 DIAGNOSIS — E119 Type 2 diabetes mellitus without complications: Secondary | ICD-10-CM | POA: Diagnosis not present

## 2017-05-18 DIAGNOSIS — J309 Allergic rhinitis, unspecified: Secondary | ICD-10-CM | POA: Diagnosis not present

## 2017-05-18 DIAGNOSIS — I10 Essential (primary) hypertension: Secondary | ICD-10-CM

## 2017-05-18 MED ORDER — ACYCLOVIR 400 MG PO TABS: ORAL_TABLET | ORAL | 3 refills | Status: DC

## 2017-05-18 NOTE — Progress Notes (Signed)
[facility] [place], [place] [address]  Internal MEDICINE  Office Visit Note  Patient Name: Danielle Wallace  [id number]  [id number]  Date of Service: 05/18/2017     Complaints/HPI:  Pt is here for a sick visit.  The patient is here for sick visit today. She is c/o swollen lymph nodes, sore throat, headache, and stiff neck. Symptoms begav the first of last week. Was treated 04/2017 with acyclovir 450m BID for 7 days. Felt very well for several weeks. New symptoms started at the beginning of last week. She denies los of appetite, nausea, or vomiting .     Current Medication:  Outpatient Encounter Medications as of 05/18/2017  Medication Sig Note  . canagliflozin (INVOKANA) 300 MG TABS tablet TAKE 1 TABLET BY MOUTH EVERY DAY 11[facility]29[facility]2014[facility] etodolac (LODINE) 400 MG tablet TAKE 1 TABLET BY MOUTH 3 TIMES A DAY AS NEEDED FOR PAIN [facility]22[facility] fluticasone (FLONASE) 50 MCG/ACT nasal spray Place into both nostrils daily.   .Marland KitchenglipiZIDE (GLUCOTROL XL) 5 MG 24 hr tablet Take 5 mg by mouth daily with supper.   .Marland Kitchenimipramine (TOFRANIL) 25 MG tablet Take 25 mg by mouth at bedtime.   .Marland Kitchenlevocetirizine (XYZAL) 5 MG tablet Take 5 mg by mouth every evening.   . linaclotide (LINZESS) 145 MCG CAPS capsule Take 145 mcg by mouth daily before breakfast.   . metFORMIN (GLUCOPHAGE) 500 MG tablet Take 2,000 mg by mouth every evening.    . montelukast (SINGULAIR) 10 MG tablet Take 10 mg by mouth at bedtime.   . nadolol (CORGARD) 40 MG tablet Take 1 tablet (40 mg total) by mouth daily.   .Marland Kitchenomeprazole (PRILOSEC) 40 MG capsule Take 40 mg by mouth daily.   . tamoxifen (NOLVADEX) 20 MG tablet TAKE 1 TABLET BY MOUTH EVERY DAY   . valsartan-hydrochlorothiazide (DIOVAN-HCT) 80-12.5 MG per tablet Take 1 tablet by mouth daily.   . Calcium Carb-Cholecalciferol (CALCIUM + D3) 600-200 MG-UNIT TABS Take 1  tablet by mouth 2 (two) times daily. (Patient not taking: Reported on 01/13/2017)   . gabapentin (NEURONTIN) 600 MG tablet Take 600 mg by mouth 3 (three) times daily. 03/22/2015: Received from: External Pharmacy  . venlafaxine XR (EFFEXOR-XR) 37.5 MG 24 hr capsule TAKE 1 CAPSULE (37.5 MG TOTAL) BY MOUTH DAILY WITH BREAKFAST.    No facility-administered encounter medications on file as of 05/18/2017.       Medical History: Past Medical History:  Diagnosis Date  . Arthritis 2006  . Asthma   . [place]   18 mm, T1c, N0; ER/ PR positive,her 2 neu overexpressed. Adjuvant chemo/ herceptin.  . [facility] 2006   Renal cell carcinoma; cryosurgery treatment, right side  . [place]   . Collapsed lung 2007  . [place] [address]  Metformin  . Diffuse cystic mastopathy   . Motion sickness    any moving vehicle  . Orthodontics    braces  . Personal history of chemotherapy   . Sinus problem     Today's Vitals   05/18/17 1513  BP: 129/78  Pulse: 84  Resp: 16  SpO2: 98%  Weight: 204 lb 3.2 oz (92.6 kg)    Review of Systems  Constitutional: Positive for chills and fatigue. Negative for fever.  HENT: Positive for rhinorrhea, sinus pressure, sore throat and voice change. Negative for congestion, ear  pain, postnasal drip and sinus pain.        Swollen lymph nodes   Respiratory: Negative for cough, shortness of breath and wheezing.   Cardiovascular: Negative for chest pain (v) and palpitations.  Gastrointestinal: Negative for constipation, diarrhea, nausea and vomiting.  Endocrine: Negative.   Genitourinary: Negative.   Musculoskeletal: Positive for arthralgias and myalgias.  Skin: Negative.   Allergic/Immunologic: Negative.   Neurological: Positive for headaches.  Hematological: Negative.   Psychiatric/Behavioral: Negative.     Physical Exam  Constitutional: She is oriented to person, place, and time. She appears  well-developed and well-nourished.  HENT:  Head: Normocephalic and atraumatic.  Right Ear: Tympanic membrane is erythematous and bulging.  Left Ear: Tympanic membrane, external ear and ear canal normal.  Mouth/Throat: Posterior oropharyngeal edema and posterior oropharyngeal erythema present.  Eyes: Pupils are equal, round, and reactive to light.  Neck: Neck supple. Decreased range of motion present.  Cardiovascular: Normal rate, regular rhythm and normal heart sounds.  Pulmonary/Chest: Effort normal and breath sounds normal.  Abdominal: Soft. Bowel sounds are normal. There is no tenderness.  Lymphadenopathy:       Head (right side): Occipital adenopathy present.       Head (left side): Occipital adenopathy present.    She has cervical adenopathy.       Right cervical: Superficial cervical and posterior cervical adenopathy present.       Left cervical: Superficial cervical and posterior cervical adenopathy present.  Neurological: She is alert and oriented to person, place, and time.  Skin: Skin is warm and dry.  Psychiatric: She has a normal mood and affect.  Nursing note and vitals reviewed.   Assessment/Plan:    ICD-10-CM   1. [place] Patient virus infection B27.90 acyclovir (ZOVIRAX) 400 MG tablet  2. Diabetes mellitus without complication ([place]) G28.3   3. Essential hypertension I10   4. Allergic rhinitis, unspecified seasonality, unspecified trigger J30.9    1. Restart acyclovir 492m bid for 10 days then daily to suppress viral symptoms. Continue for next few months and reassess symptoms with blood work after next routine visit.  2. Continue all diabetic medication as prescribed 3. Continue bp medications 4. Continue to ake all allergy meds as prescribed.   Follow up as needed and as scheduled.    General Counseling : I have discussed the findings of the evaluation and examination with MSelinda Eon  I have also discussed any further diagnostic evaluation that may be needed or  ordered today. MJessverbalizes understanding of the findings of todays visit. We also reviewed her medications today. she has been encouraged to call the office with any questions or concerns that should arise related to todays visit.   This patient was seen by HLeretha Pol FNP- C in Collaboration with Dr FLavera Guiseas a part of collaborative care agreement   Time spent: 15 minutes

## 2017-05-21 ENCOUNTER — Inpatient Hospital Stay: Payer: BLUE CROSS/BLUE SHIELD | Attending: Internal Medicine

## 2017-05-21 DIAGNOSIS — B965 Pseudomonas (aeruginosa) (mallei) (pseudomallei) as the cause of diseases classified elsewhere: Secondary | ICD-10-CM | POA: Diagnosis not present

## 2017-05-21 DIAGNOSIS — I864 Gastric varices: Secondary | ICD-10-CM | POA: Diagnosis not present

## 2017-05-21 DIAGNOSIS — Z9012 Acquired absence of left breast and nipple: Secondary | ICD-10-CM | POA: Insufficient documentation

## 2017-05-21 DIAGNOSIS — R945 Abnormal results of liver function studies: Secondary | ICD-10-CM | POA: Insufficient documentation

## 2017-05-21 DIAGNOSIS — Z803 Family history of malignant neoplasm of breast: Secondary | ICD-10-CM | POA: Diagnosis not present

## 2017-05-21 DIAGNOSIS — Z7984 Long term (current) use of oral hypoglycemic drugs: Secondary | ICD-10-CM | POA: Diagnosis not present

## 2017-05-21 DIAGNOSIS — Z9221 Personal history of antineoplastic chemotherapy: Secondary | ICD-10-CM | POA: Diagnosis not present

## 2017-05-21 DIAGNOSIS — E119 Type 2 diabetes mellitus without complications: Secondary | ICD-10-CM | POA: Insufficient documentation

## 2017-05-21 DIAGNOSIS — Z79810 Long term (current) use of selective estrogen receptor modulators (SERMs): Secondary | ICD-10-CM | POA: Insufficient documentation

## 2017-05-21 DIAGNOSIS — K7460 Unspecified cirrhosis of liver: Secondary | ICD-10-CM | POA: Diagnosis not present

## 2017-05-21 DIAGNOSIS — Z9882 Breast implant status: Secondary | ICD-10-CM | POA: Diagnosis not present

## 2017-05-21 DIAGNOSIS — Z79899 Other long term (current) drug therapy: Secondary | ICD-10-CM | POA: Insufficient documentation

## 2017-05-21 DIAGNOSIS — Z8041 Family history of malignant neoplasm of ovary: Secondary | ICD-10-CM | POA: Insufficient documentation

## 2017-05-21 DIAGNOSIS — Z170 Estrogen receptor positive status [ER+]: Secondary | ICD-10-CM | POA: Diagnosis not present

## 2017-05-21 DIAGNOSIS — Z792 Long term (current) use of antibiotics: Secondary | ICD-10-CM | POA: Insufficient documentation

## 2017-05-21 DIAGNOSIS — C50412 Malignant neoplasm of upper-outer quadrant of left female breast: Secondary | ICD-10-CM | POA: Insufficient documentation

## 2017-05-21 DIAGNOSIS — Z880 Allergy status to penicillin: Secondary | ICD-10-CM | POA: Insufficient documentation

## 2017-05-21 DIAGNOSIS — Z8614 Personal history of Methicillin resistant Staphylococcus aureus infection: Secondary | ICD-10-CM | POA: Insufficient documentation

## 2017-05-21 DIAGNOSIS — Z881 Allergy status to other antibiotic agents status: Secondary | ICD-10-CM | POA: Insufficient documentation

## 2017-05-21 DIAGNOSIS — Z808 Family history of malignant neoplasm of other organs or systems: Secondary | ICD-10-CM | POA: Insufficient documentation

## 2017-05-21 DIAGNOSIS — R232 Flushing: Secondary | ICD-10-CM | POA: Diagnosis not present

## 2017-05-21 LAB — FERRITIN: Ferritin: 48 ng/mL (ref 11–307)

## 2017-05-21 LAB — CBC WITH DIFFERENTIAL/PLATELET
Basophils Absolute: 0 10*3/uL (ref 0–0.1)
Basophils Relative: 1 %
EOS PCT: 4 %
Eosinophils Absolute: 0.3 10*3/uL (ref 0–0.7)
HCT: 45.8 % (ref 35.0–47.0)
HEMOGLOBIN: 15.3 g/dL (ref 12.0–16.0)
LYMPHS ABS: 2.5 10*3/uL (ref 1.0–3.6)
Lymphocytes Relative: 40 %
MCH: 30.8 pg (ref 26.0–34.0)
MCHC: 33.5 g/dL (ref 32.0–36.0)
MCV: 91.9 fL (ref 80.0–100.0)
MONOS PCT: 8 %
Monocytes Absolute: 0.5 10*3/uL (ref 0.2–0.9)
NEUTROS PCT: 47 %
Neutro Abs: 3 10*3/uL (ref 1.4–6.5)
Platelets: 227 10*3/uL (ref 150–440)
RBC: 4.98 MIL/uL (ref 3.80–5.20)
RDW: 13.7 % (ref 11.5–14.5)
WBC: 6.4 10*3/uL (ref 3.6–11.0)

## 2017-05-21 LAB — IRON AND TIBC
Iron: 72 ug/dL (ref 28–170)
SATURATION RATIOS: 18 % (ref 10.4–31.8)
TIBC: 392 ug/dL (ref 250–450)
UIBC: 320 ug/dL

## 2017-05-21 LAB — COMPREHENSIVE METABOLIC PANEL
ALT: 60 U/L — AB (ref 14–54)
AST: 48 U/L — AB (ref 15–41)
Albumin: 4 g/dL (ref 3.5–5.0)
Alkaline Phosphatase: 103 U/L (ref 38–126)
Anion gap: 8 (ref 5–15)
BUN: 20 mg/dL (ref 6–20)
CHLORIDE: 99 mmol/L — AB (ref 101–111)
CO2: 30 mmol/L (ref 22–32)
CREATININE: 0.65 mg/dL (ref 0.44–1.00)
Calcium: 9.3 mg/dL (ref 8.9–10.3)
GFR calc Af Amer: >60 mL/min (ref >60)
GFR calc non Af Amer: >60 mL/min (ref >60)
Glucose, Bld: 107 mg/dL — ABNORMAL HIGH (ref 65–99)
Potassium: 4.5 mmol/L (ref 3.5–5.1)
SODIUM: 137 mmol/L (ref 135–145)
Total Bilirubin: 0.7 mg/dL (ref 0.3–1.2)
Total Protein: 7.4 g/dL (ref 6.5–8.1)

## 2017-05-22 LAB — CANCER ANTIGEN 27.29: CA 27.29: 23.1 U/mL (ref 0.0–38.6)

## 2017-05-28 ENCOUNTER — Inpatient Hospital Stay (HOSPITAL_BASED_OUTPATIENT_CLINIC_OR_DEPARTMENT_OTHER): Payer: BLUE CROSS/BLUE SHIELD | Admitting: Internal Medicine

## 2017-05-28 ENCOUNTER — Other Ambulatory Visit: Payer: Self-pay

## 2017-05-28 ENCOUNTER — Encounter: Payer: Self-pay | Admitting: Internal Medicine

## 2017-05-28 VITALS — BP 129/89 | HR 89 | Temp 97.0°F | Resp 20

## 2017-05-28 DIAGNOSIS — R945 Abnormal results of liver function studies: Secondary | ICD-10-CM | POA: Diagnosis not present

## 2017-05-28 DIAGNOSIS — R232 Flushing: Secondary | ICD-10-CM | POA: Diagnosis not present

## 2017-05-28 DIAGNOSIS — Z9882 Breast implant status: Secondary | ICD-10-CM | POA: Diagnosis not present

## 2017-05-28 DIAGNOSIS — Z881 Allergy status to other antibiotic agents status: Secondary | ICD-10-CM

## 2017-05-28 DIAGNOSIS — Z880 Allergy status to penicillin: Secondary | ICD-10-CM

## 2017-05-28 DIAGNOSIS — Z7984 Long term (current) use of oral hypoglycemic drugs: Secondary | ICD-10-CM | POA: Diagnosis not present

## 2017-05-28 DIAGNOSIS — Z170 Estrogen receptor positive status [ER+]: Secondary | ICD-10-CM

## 2017-05-28 DIAGNOSIS — C50412 Malignant neoplasm of upper-outer quadrant of left female breast: Secondary | ICD-10-CM | POA: Diagnosis not present

## 2017-05-28 DIAGNOSIS — K7460 Unspecified cirrhosis of liver: Secondary | ICD-10-CM

## 2017-05-28 DIAGNOSIS — B965 Pseudomonas (aeruginosa) (mallei) (pseudomallei) as the cause of diseases classified elsewhere: Secondary | ICD-10-CM

## 2017-05-28 DIAGNOSIS — E119 Type 2 diabetes mellitus without complications: Secondary | ICD-10-CM

## 2017-05-28 DIAGNOSIS — Z9012 Acquired absence of left breast and nipple: Secondary | ICD-10-CM | POA: Diagnosis not present

## 2017-05-28 DIAGNOSIS — Z79810 Long term (current) use of selective estrogen receptor modulators (SERMs): Secondary | ICD-10-CM | POA: Diagnosis not present

## 2017-05-28 DIAGNOSIS — Z79899 Other long term (current) drug therapy: Secondary | ICD-10-CM

## 2017-05-28 DIAGNOSIS — I864 Gastric varices: Secondary | ICD-10-CM | POA: Diagnosis not present

## 2017-05-28 DIAGNOSIS — Z792 Long term (current) use of antibiotics: Secondary | ICD-10-CM

## 2017-05-28 DIAGNOSIS — Z9221 Personal history of antineoplastic chemotherapy: Secondary | ICD-10-CM

## 2017-05-28 DIAGNOSIS — Z8614 Personal history of Methicillin resistant Staphylococcus aureus infection: Secondary | ICD-10-CM

## 2017-05-28 NOTE — Assessment & Plan Note (Addendum)
Stage I Breast cancer ER/PR/her 2 neu POS-  currently on tamoxifen. Tolerating well.   # Clinically no evidence of recurrence. Mammogram in June 2018- negative. Continue tamoxifen.  Will check breast cancer index- discussed 10 vs 5; fall of 2021 x 5 years.   # Elevated LFTs~? Cirrhossi/NASH; gastric varices s/p egd- sep 2018- Dr.Wohl.   # hot flashes/ vasomotor symptoms- off effexor; not any worse. STABLE.   # BMD- Normal Feb 2017; continue calcium and vitamin D.  # follow up in 6 months/labs prior; mammogram June 2018.

## 2017-05-28 NOTE — Progress Notes (Signed)
[place] OFFICE PROGRESS NOTE  Patient Care Team: Lavera Guise, MD as PCP - General (Internal Medicine) Bary Castilla, [place] Gleason, MD (General Surgery) Dion Body, MD as Referring Physician (Family Medicine) [place] Gleason, MD (Oncology)  Cancer Staging No matching staging information was found for the patient.    Oncology History   # July 2016- 1.carcinoma of breast(left) T1 C. N0 M0 status post ultrasound-guided biopsy Estrogen receptor positive. Progesterone receptor positive.  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 chemotherapy on hold since November because of cellulitis in the chest wall. 4.started on Herceptin May 22, 2014.  Cause of recurrent chest wall  infection chemotherapy was put on hold. 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  Patient is on multiple antibiotic and as per infectious disease specialist similar to stay on antibiotic for 4 months. So chemotherapy has been discontinued. Maintenance Herceptin as well as patient was started on letrozole from June 12, 2014 6.  Port was removed because of Pseudomonas infection of the pocket (August, 2016) 7.  Patient is finishing up Herceptin in August of 2016 now on letrozole;   # FEB 2017- TAMOXIFEN    # Jan 2019- [facility]- ordered.   # Cirrhosis/ ? Etiology; gastric varices [Dr.Wohl]  # Hemochromatosis FHx     Carcinoma of upper-outer quadrant of left breast in female, estrogen receptor positive ([name])        INTERVAL HISTORY:  Danielle Wallace 57 y.o.  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  In the interim patient had a EGD gastric varices.  Patient has been diagnosed with cirrhosis of unclear etiology.  Currently compensated.  She has been recently diagnosed with mono/EBV infection based on serologies.  She is on acyclovir.  Her symptoms are improving already.  Otherwise patient denies any new lumps or bumps. Denies any bone pain or headaches.  Denies any alcohol abuse.  REVIEW OF SYSTEMS:  A complete 10 point review of system is done which is negative except mentioned above/history of present illness.   PAST MEDICAL HISTORY :  Past Medical History:  Diagnosis Date  . Arthritis 2006  . Asthma   . Breast cancer ([place]) 10/14/2013   18 mm, T1c, N0; ER/ PR positive,her 2 neu overexpressed. Adjuvant chemo/ herceptin.  . Cancer [facility]) 2006   Renal cell carcinoma; cryosurgery treatment, right side  . Cirrhosis ([place])   . Collapsed lung 2007  . Diabetes mellitus without complication ([place]) 1017   Metformin  . Diffuse cystic mastopathy   . Motion sickness    any moving vehicle  . Orthodontics    braces  . Personal history of chemotherapy   . Sinus problem     PAST SURGICAL HISTORY :   Past Surgical History:  Procedure Laterality Date  . BREAST BIOPSY Right 2005   negative  . BREAST BIOPSY Left 08/23/2007   negative  . BREAST BIOPSY Left 10/24/2013   positive  . BREAST BIOPSY Left 07/2002   neg  . BREAST BIOPSY Left 08/23/2007   neg  . BREAST SURGERY Left 10/2011   Cyst Aspirationapocrine metaplasia, ductal cells and bone cells,  hypo-cellular  . BREAST SURGERY Left 2009   ADH on stereotactic biopsy, 1.5 mm focus.  Marland Kitchen BREAST SURGERY Left 2003   fibrocystic changes with ductal hyperplasia without atypia.  Marland Kitchen BREAST SURGERY Left    mastectomy  . BREAST SURGERY Left April 11, 2014   Removal of implant, debridement chest wall Dr.Coan  . CESAREAN SECTION  1991  . CHOLECYSTECTOMY  1990  . COLONOSCOPY  08/26/2013   Verdie Shire, M.D. normal.  . CRYOABLATION  2005,  2010  . CYST REMOVAL NECK  10/2011   Dr. Tami Ribas  . ESOPHAGOGASTRODUODENOSCOPY (EGD) WITH PROPOFOL N/A 01/16/2017   Procedure: ESOPHAGOGASTRODUODENOSCOPY (EGD) WITH PROPOFOL;  Surgeon: Lucilla Lame, MD;  Location: [place];  Service: Gastroenterology;  Laterality: N/A;  Diabetic - oral meds  . INCISION AND DRAINAGE Left Dec 2015, Feb 2016   Dr Tula Nakayama  . MASTECTOMY Left 11/24/2013   positive  . PORTACATH PLACEMENT  11/24/13    FAMILY HISTORY :   Family History  Problem Relation Age of Onset  . Liver cancer Father   . Hemochromatosis Father   . Liver disease Father   . Diabetes Mother   . Hypertension Mother   . Breast cancer Paternal Aunt 63  . Ovarian cancer Maternal Grandmother   . Diabetes Sister   . Hemachromatosis Daughter     SOCIAL HISTORY:   Social History   Tobacco Use  . Smoking status: Never Smoker  . Smokeless tobacco: Never Used  Substance Use Topics  . Alcohol use: Yes    Comment: occasionally, none recently  . Drug use: No    ALLERGIES:  is allergic to exemestane; floxin [ofloxacin]; gluten meal; levofloxacin; linezolid; taxotere [docetaxel]; hydrocodone; sulfa antibiotics; codeine; penicillins; and vancomycin.  MEDICATIONS:  Current Outpatient Medications  Medication Sig Dispense Refill  . acyclovir (ZOVIRAX) 400 MG tablet Take 1 tablet po BID for 10 days then take 1 tablet po QD (Patient taking differently: Take 400 mg by mouth daily. Take 1 tablet po BID for 10 days then take 1 tablet po QD) 40 tablet 3  . canagliflozin (INVOKANA) 300 MG TABS tablet TAKE 1 TABLET BY MOUTH EVERY DAY    . etodolac (LODINE) 400 MG tablet TAKE 1 TABLET BY MOUTH 3 TIMES A DAY AS NEEDED FOR PAIN    . fluticasone (FLONASE) 50 MCG/ACT nasal spray Place into both nostrils daily.    Marland Kitchen glipiZIDE (GLUCOTROL XL) 5 MG 24 hr tablet Take 5 mg by mouth daily with supper.    Marland Kitchen imipramine (TOFRANIL) 25 MG tablet Take 25 mg by mouth at bedtime.    Marland Kitchen levocetirizine (XYZAL) 5 MG tablet  Take 5 mg by mouth every evening.    . linaclotide (LINZESS) 145 MCG CAPS capsule Take 145 mcg by mouth daily before breakfast.    . metFORMIN (GLUCOPHAGE) 500 MG tablet Take 2,000 mg by mouth every evening.     . montelukast (SINGULAIR) 10 MG tablet Take 10 mg by mouth at bedtime.    . nadolol (CORGARD) 40 MG tablet Take 1 tablet (40 mg total) by mouth daily. 30 tablet 11  . omeprazole (PRILOSEC) 40 MG capsule Take 40 mg by mouth daily.    . tamoxifen (NOLVADEX) 20 MG tablet TAKE 1 TABLET BY MOUTH EVERY DAY 90 tablet 0  . valsartan-hydrochlorothiazide (DIOVAN-HCT) 80-12.5 MG per tablet Take 1 tablet by mouth daily.    . Calcium Carb-Cholecalciferol (CALCIUM + D3) 600-200 MG-UNIT TABS Take 1 tablet by mouth 2 (two) times daily. (  Patient not taking: Reported on 01/13/2017) 180 tablet 3  . gabapentin (NEURONTIN) 600 MG tablet Take 600 mg by mouth 3 (three) times daily.  2   No current facility-administered medications for this visit.     PHYSICAL EXAMINATION: ECOG PERFORMANCE STATUS: 0 - Asymptomatic  BP 129/89   Pulse 89   Temp (!) 97 F (36.1 C) (Tympanic)   Resp 20   There were no vitals filed for this visit.  GENERAL: Well-nourished well-developed; Alert, no distress and comfortable.   Alone.  EYES: no pallor or icterus OROPHARYNX: no thrush or ulceration; good dentition  NECK: supple, no masses felt LYMPH:  no palpable lymphadenopathy in the cervical, axillary or inguinal regions LUNGS: clear to auscultation and  No wheeze or crackles HEART/CVS: regular rate & rhythm and no murmurs; +1 bil lower extremity edema ABDOMEN:abdomen soft, non-tender and normal bowel sounds; Question hepatomegaly. Mild tend epigastric region. Musculoskeletal:no cyanosis of digits and no clubbing  PSYCH: alert & oriented x 3 with fluent speech NEURO: no focal motor/sensory deficits Right and left BREAST exam [in the presence of nurse]-  Right breastno unusual skin changes or dominant masses felt.  Left  mastectomy- Surgical scars noted; no lumps or bumps noted      LABORATORY DATA:  I have reviewed the data as listed    Component Value Date/Time   NA 137 05/21/2017 1135   NA 138 09/04/2014 1358   K 4.5 05/21/2017 1135   K 4.0 09/04/2014 1358   CL 99 (L) 05/21/2017 1135   CL 99 (L) 09/04/2014 1358   CO2 30 05/21/2017 1135   CO2 31 09/04/2014 1358   GLUCOSE 107 (H) 05/21/2017 1135   GLUCOSE 136 (H) 09/04/2014 1358   BUN 20 05/21/2017 1135   BUN 18 09/04/2014 1358   CREATININE 0.65 05/21/2017 1135   CREATININE 0.53 09/04/2014 1358   CALCIUM 9.3 05/21/2017 1135   CALCIUM 9.7 09/04/2014 1358   PROT 7.4 05/21/2017 1135   PROT 7.0 12/15/2016 1438   PROT 7.8 09/04/2014 1358   ALBUMIN 4.0 05/21/2017 1135   ALBUMIN 4.4 12/15/2016 1438   ALBUMIN 4.1 09/04/2014 1358   AST 48 (H) 05/21/2017 1135   AST 32 09/04/2014 1358   ALT 60 (H) 05/21/2017 1135   ALT 33 09/04/2014 1358   ALKPHOS 103 05/21/2017 1135   ALKPHOS 73 09/04/2014 1358   BILITOT 0.7 05/21/2017 1135   BILITOT 0.5 12/15/2016 1438   BILITOT 0.6 09/04/2014 1358   GFRNONAA >60 05/21/2017 1135   GFRNONAA >60 09/04/2014 1358   GFRAA >60 05/21/2017 1135   GFRAA >60 09/04/2014 1358    No results found for: SPEP, UPEP  Lab Results  Component Value Date   WBC 6.4 05/21/2017   NEUTROABS 3.0 05/21/2017   HGB 15.3 05/21/2017   HCT 45.8 05/21/2017   MCV 91.9 05/21/2017   PLT 227 05/21/2017      Chemistry      Component Value Date/Time   NA 137 05/21/2017 1135   NA 138 09/04/2014 1358   K 4.5 05/21/2017 1135   K 4.0 09/04/2014 1358   CL 99 (L) 05/21/2017 1135   CL 99 (L) 09/04/2014 1358   CO2 30 05/21/2017 1135   CO2 31 09/04/2014 1358   BUN 20 05/21/2017 1135   BUN 18 09/04/2014 1358   CREATININE 0.65 05/21/2017 1135   CREATININE 0.53 09/04/2014 1358      Component Value Date/Time   CALCIUM 9.3 05/21/2017 1135   CALCIUM 9.7  09/04/2014 1358   ALKPHOS 103 05/21/2017 1135   ALKPHOS 73 09/04/2014 1358    AST 48 (H) 05/21/2017 1135   AST 32 09/04/2014 1358   ALT 60 (H) 05/21/2017 1135   ALT 33 09/04/2014 1358   BILITOT 0.7 05/21/2017 1135   BILITOT 0.5 12/15/2016 1438   BILITOT 0.6 09/04/2014 1358       RADIOGRAPHIC STUDIES: I have personally reviewed the radiological images as listed and agreed with the findings in the report. No results found.   ASSESSMENT & PLAN:  Carcinoma of upper-outer quadrant of left breast in female, estrogen receptor positive ([place]) Stage I Breast cancer ER/PR/her 2 neu POS-  currently on tamoxifen. Tolerating well.   # Clinically no evidence of recurrence. Mammogram in June 2018- negative. Continue tamoxifen.  Will check breast cancer index- discussed 10 vs 5; fall of 2021 x 5 years.   # Elevated LFTs~? Cirrhossi/NASH; gastric varices s/p egd- sep 2018- Dr.Wohl.   # hot flashes/ vasomotor symptoms- off effexor; not any worse. STABLE.   # BMD- Normal Feb 2017; continue calcium and vitamin D.  # follow up in 6 months/labs prior; mammogram June 2018.      Orders Placed This Encounter  Procedures  . MM SCREENING BREAST TOMO UNI R    Standing Status:   Future    Standing Expiration Date:   07/27/2018    Order Specific Question:   Reason for Exam (SYMPTOM  OR DIAGNOSIS REQUIRED)    Answer:   history of breast cancer    Order Specific Question:   Is the patient pregnant?    Answer:   No    Order Specific Question:   Preferred imaging location?    Answer:   [facility]   All questions were answered. The patient knows to call the clinic with any problems, questions or concerns.      Cammie Sickle, MD 05/30/2017 5:46 PM

## 2017-06-19 ENCOUNTER — Other Ambulatory Visit: Payer: Self-pay | Admitting: Internal Medicine

## 2017-06-19 DIAGNOSIS — C50412 Malignant neoplasm of upper-outer quadrant of left female breast: Secondary | ICD-10-CM

## 2017-06-23 ENCOUNTER — Other Ambulatory Visit: Payer: Self-pay | Admitting: Internal Medicine

## 2017-06-23 DIAGNOSIS — C50412 Malignant neoplasm of upper-outer quadrant of left female breast: Secondary | ICD-10-CM

## 2017-08-08 ENCOUNTER — Other Ambulatory Visit: Payer: Self-pay | Admitting: Internal Medicine

## 2017-08-10 ENCOUNTER — Other Ambulatory Visit: Payer: Self-pay | Admitting: Internal Medicine

## 2017-08-11 ENCOUNTER — Encounter: Payer: Self-pay | Admitting: Nurse Practitioner

## 2017-08-11 ENCOUNTER — Ambulatory Visit: Payer: BLUE CROSS/BLUE SHIELD | Admitting: Nurse Practitioner

## 2017-08-11 VITALS — BP 117/77 | HR 86 | Resp 16 | Ht 66.0 in | Wt 205.6 lb

## 2017-08-11 DIAGNOSIS — J0140 Acute pansinusitis, unspecified: Secondary | ICD-10-CM

## 2017-08-11 DIAGNOSIS — I10 Essential (primary) hypertension: Secondary | ICD-10-CM | POA: Diagnosis not present

## 2017-08-11 DIAGNOSIS — B2790 Infectious mononucleosis, unspecified without complication: Secondary | ICD-10-CM

## 2017-08-11 DIAGNOSIS — E11649 Type 2 diabetes mellitus with hypoglycemia without coma: Secondary | ICD-10-CM

## 2017-08-11 LAB — POCT GLYCOSYLATED HEMOGLOBIN (HGB A1C): Hemoglobin A1C: 6.3

## 2017-08-11 MED ORDER — AZITHROMYCIN 250 MG PO TABS
ORAL_TABLET | ORAL | 1 refills | Status: DC
Start: 2017-08-11 — End: 2017-09-03

## 2017-08-11 NOTE — Progress Notes (Signed)
[facility] [place], [place] [address]  Internal MEDICINE  Office Visit Note  Patient Name: Danielle Wallace  [id number]  [id number]  Date of Service: 08/12/2017  Chief Complaint  Patient presents with  . Fever    since thursday  . Cough    since thursday and some drainage  . Sore Throat  . Ear Pain    Cough  This is a recurrent problem. The current episode started in the past 7 days. The problem has been waxing and waning. The problem occurs every few hours. The cough is productive of sputum. Associated symptoms include ear congestion, eye redness, a fever, myalgias, nasal congestion, postnasal drip, rhinorrhea, a sore throat, shortness of breath and wheezing. Pertinent negatives include no chest pain or rash. The symptoms are aggravated by lying down. She has tried body position changes and OTC cough suppressant for the symptoms. The treatment provided mild relief. Her past medical history is significant for environmental allergies.    Pt is here for routine follow up.    Current Medication: Outpatient Encounter Medications as of 08/11/2017  Medication Sig Note  . acyclovir (ZOVIRAX) 400 MG tablet Take 1 tablet po BID for 10 days then take 1 tablet po QD (Patient taking differently: Take 400 mg by mouth daily. Take 1 tablet po BID for 10 days then take 1 tablet po QD)   . Calcium Carb-Cholecalciferol (CALCIUM + D3) 600-200 MG-UNIT TABS Take 1 tablet by mouth 2 (two) times daily.   . canagliflozin (INVOKANA) 300 MG TABS tablet TAKE 1 TABLET BY MOUTH EVERY DAY 11/29/2014: Received from: [facility]  . dextromethorphan-guaiFENesin (MUCINEX DM) 30-600 MG 12hr tablet Take 1 tablet by mouth 2 (two) times daily.   . diphenhydrAMINE (BENADRYL) 25 MG tablet Take 25 mg by mouth every 6 (six) hours as needed.   . etodolac (LODINE) 400 MG tablet TAKE 1 TABLET BY MOUTH 3 TIMES A DAY AS NEEDED FOR PAIN 03/22/2015: Received from: [facility]  . fluticasone (FLONASE) 50 MCG/ACT nasal spray Place into both nostrils daily.   Marland Kitchen glipiZIDE (GLUCOTROL XL) 5 MG 24 hr tablet Take 5 mg by mouth daily with supper.   Marland Kitchen imipramine (TOFRANIL) 25 MG tablet Take 25 mg by mouth at bedtime.   Marland Kitchen levocetirizine (XYZAL) 5 MG tablet Take 5 mg by mouth every evening.   . linaclotide (LINZESS) 145 MCG CAPS capsule Take 145 mcg by mouth daily before breakfast.   . metFORMIN (GLUCOPHAGE) 500 MG tablet Take 2,000 mg by mouth every evening.    . montelukast (SINGULAIR) 10 MG tablet TAKE 1 TABLET BY MOUTH EVERY DAY   . nadolol (CORGARD) 40 MG tablet Take 1 tablet (40 mg total) by mouth daily.   Marland Kitchen omeprazole (PRILOSEC) 40 MG capsule TAKE 1 CAPSULE BY MOUTH EVERY DAY   . tamoxifen (NOLVADEX) 20 MG tablet TAKE 1 TABLET BY MOUTH EVERY DAY   . valsartan-hydrochlorothiazide (DIOVAN-HCT) 80-12.5 MG per tablet Take 1 tablet by mouth daily.   Marland Kitchen azithromycin (ZITHROMAX) 250 MG tablet z-pack - take as directed for 5 days   . gabapentin (NEURONTIN) 600 MG tablet Take 600 mg by mouth 3 (three) times daily. 03/22/2015: Received from: External Pharmacy  . PROAIR HFA 108 (90 Base) MCG/ACT inhaler 2 puffs 2 (two) times daily as needed.   . [DISCONTINUED] tamoxifen (NOLVADEX) 20 MG tablet TAKE 1 TABLET BY MOUTH EVERY DAY    No facility-administered encounter medications on file as  of 08/11/2017.     Surgical History: Past Surgical History:  Procedure Laterality Date  . BREAST BIOPSY Right 2005   negative  . BREAST BIOPSY Left 08/23/2007   negative  . BREAST BIOPSY Left 10/24/2013   positive  . BREAST BIOPSY Left 07/2002   neg  . BREAST BIOPSY Left 08/23/2007   neg  . BREAST SURGERY Left 10/2011   Cyst Aspirationapocrine metaplasia, ductal cells and bone cells, hypo-cellular  . BREAST SURGERY Left 2009   ADH on stereotactic biopsy, 1.5 mm focus.  Marland Kitchen BREAST SURGERY Left 2003   fibrocystic changes with ductal hyperplasia without atypia.  Marland Kitchen BREAST SURGERY Left     mastectomy  . BREAST SURGERY Left April 11, 2014   Removal of implant, debridement chest wall Dr.Coan  . CESAREAN SECTION  1991  . CHOLECYSTECTOMY  1990  . COLONOSCOPY  08/26/2013   Verdie Shire, M.D. normal.  . CRYOABLATION  2005, 2010  . CYST REMOVAL NECK  10/2011   Dr. Tami Ribas  . ESOPHAGOGASTRODUODENOSCOPY (EGD) WITH PROPOFOL N/A 01/16/2017   Procedure: ESOPHAGOGASTRODUODENOSCOPY (EGD) WITH PROPOFOL;  Surgeon: Lucilla Lame, MD;  Location: [place];  Service: Gastroenterology;  Laterality: N/A;  Diabetic - oral meds  . INCISION AND DRAINAGE Left Dec 2015, Feb 2016   Dr Tula Nakayama  . MASTECTOMY Left 11/24/2013   positive  . PORTACATH PLACEMENT  11/24/13    Medical History: Past Medical History:  Diagnosis Date  . Arthritis 2006  . Asthma   . Breast cancer ([place]) 10/14/2013   18 mm, T1c, N0; ER/ PR positive,her 2 neu overexpressed. Adjuvant chemo/ herceptin.  . Cancer [facility]) 2006   Renal cell carcinoma; cryosurgery treatment, right side  . Cirrhosis ([place])   . Collapsed lung 2007  . Diabetes mellitus without complication ([place]) 1517   Metformin  . Diffuse cystic mastopathy   . Motion sickness    any moving vehicle  . Orthodontics    braces  . Personal history of chemotherapy   . Sinus problem     Family History: Family History  Problem Relation Age of Onset  . Liver cancer Father   . Hemochromatosis Father   . Liver disease Father   . Diabetes Mother   . Hypertension Mother   . Breast cancer Paternal Aunt 24  . Ovarian cancer Maternal Grandmother   . Diabetes Sister   . Hemachromatosis Daughter     Social History   Socioeconomic History  . Marital status: Married    Spouse name: Not on file  . Number of children: Not on file  . Years of education: Not on file  . Highest education level: Not on file  Occupational History  . Not on file  Social Needs  . Financial resource strain: Not on file  . Food insecurity:    Worry: Not on file    Inability: Not on  file  . Transportation needs:    Medical: Not on file    Non-medical: Not on file  Tobacco Use  . Smoking status: Never Smoker  . Smokeless tobacco: Never Used  Substance and Sexual Activity  . Alcohol use: Not Currently    Comment: occasionally, none recently  . Drug use: No  . Sexual activity: Not on file  Lifestyle  . Physical activity:    Days per week: Not on file    Minutes per session: Not on file  . Stress: Not on file  Relationships  . Social connections:    Talks on phone:  Not on file    Gets together: Not on file    Attends religious service: Not on file    Active member of club or organization: Not on file    Attends meetings of clubs or organizations: Not on file    Relationship status: Not on file  . Intimate partner violence:    Fear of current or ex partner: Not on file    Emotionally abused: Not on file    Physically abused: Not on file    Forced sexual activity: Not on file  Other Topics Concern  . Not on file  Social History Narrative  . Not on file      Review of Systems  Constitutional: Positive for fever.  HENT: Positive for congestion, postnasal drip, rhinorrhea, sore throat and voice change.   Eyes: Positive for redness and itching.  Respiratory: Positive for cough, shortness of breath and wheezing.   Cardiovascular: Negative for chest pain, palpitations and leg swelling.  Gastrointestinal: Positive for nausea.  Endocrine:       Blood sugars doing well   Musculoskeletal: Positive for myalgias.  Skin: Negative for rash.  Allergic/Immunologic: Positive for environmental allergies.  Hematological: Negative for adenopathy.    Today's Vitals   08/11/17 0932  BP: 117/77  Pulse: 86  Resp: 16  SpO2: 96%  Weight: 205 lb 9.6 oz (93.3 kg)  Height: 5' 6"  (1.676 m)   Physical Exam  Constitutional: She is oriented to person, place, and time. She appears well-developed and well-nourished.  HENT:  Head: Normocephalic and atraumatic.  Right  Ear: Tympanic membrane is erythematous and bulging.  Left Ear: External ear and ear canal normal. Tympanic membrane is erythematous and bulging.  Nose: Rhinorrhea present. Right sinus exhibits maxillary sinus tenderness. Left sinus exhibits maxillary sinus tenderness.  Mouth/Throat: Posterior oropharyngeal edema and posterior oropharyngeal erythema present.  Eyes: Pupils are equal, round, and reactive to light.  Neck: Neck supple. Decreased range of motion present.  Cardiovascular: Normal rate, regular rhythm and normal heart sounds.  Pulmonary/Chest: Effort normal and breath sounds normal. She has no wheezes.  Congested, non-productive cough present.   Abdominal: Soft. Bowel sounds are normal. There is no tenderness.  Lymphadenopathy:       Head (right side): Occipital adenopathy present.       Head (left side): Occipital adenopathy present.    She has cervical adenopathy.       Right cervical: Superficial cervical and posterior cervical adenopathy present.       Left cervical: Superficial cervical and posterior cervical adenopathy present.  Neurological: She is alert and oriented to person, place, and time.  Skin: Skin is warm and dry.  Psychiatric: She has a normal mood and affect. Her behavior is normal. Judgment and thought content normal.  Nursing note and vitals reviewed.  Assessment/Plan: 1. Acute non-recurrent pansinusitis - azithromycin (ZITHROMAX) 250 MG tablet; z-pack - take as directed for 5 days  Dispense: 6 tablet; Refill: 1  2. Uncontrolled type 2 diabetes mellitus with hypoglycemia, unspecified hypoglycemia coma status (HCC) - POCT HgB A1C6.3 today. Continue diabetic medications as prescribed. Continue regular visits with endocrinology as scheduled  3. Essential hypertension Stable. Continue bp medication as prescribed   4. Chronic Epstein-Barr virus infection Continue suppressive treatment with acyclovir  General Counseling: Rumi verbalizes understanding of the  findings of todays visit and agrees with plan of treatment. I have discussed any further diagnostic evaluation that may be needed or ordered today. We also reviewed her medications today.  she has been encouraged to call the office with any questions or concerns that should arise related to todays visit.  This patient was seen by Leretha Pol, FNP- C in Collaboration with Dr Lavera Guise as a part of collaborative care agreement    Orders Placed This Encounter  Procedures  . POCT HgB A1C    Meds ordered this encounter  Medications  . azithromycin (ZITHROMAX) 250 MG tablet    Sig: z-pack - take as directed for 5 days    Dispense:  6 tablet    Refill:  1    Order Specific Question:   Supervising Provider    Answer:   Lavera Guise [[id number]]    Time spent: 26 Minutes      Dr Lavera Guise Internal medicine

## 2017-08-12 DIAGNOSIS — E1165 Type 2 diabetes mellitus with hyperglycemia: Secondary | ICD-10-CM | POA: Insufficient documentation

## 2017-08-12 DIAGNOSIS — J0140 Acute pansinusitis, unspecified: Secondary | ICD-10-CM | POA: Insufficient documentation

## 2017-08-12 DIAGNOSIS — B2790 Infectious mononucleosis, unspecified without complication: Secondary | ICD-10-CM | POA: Insufficient documentation

## 2017-08-12 DIAGNOSIS — E11649 Type 2 diabetes mellitus with hypoglycemia without coma: Secondary | ICD-10-CM | POA: Insufficient documentation

## 2017-08-17 ENCOUNTER — Other Ambulatory Visit: Payer: Self-pay

## 2017-09-03 ENCOUNTER — Ambulatory Visit: Payer: BLUE CROSS/BLUE SHIELD | Admitting: Nurse Practitioner

## 2017-09-03 ENCOUNTER — Encounter: Payer: Self-pay | Admitting: Nurse Practitioner

## 2017-09-03 VITALS — BP 132/74 | HR 81 | Temp 97.0°F | Resp 16 | Ht 65.0 in | Wt 209.0 lb

## 2017-09-03 DIAGNOSIS — R5383 Other fatigue: Secondary | ICD-10-CM

## 2017-09-03 DIAGNOSIS — E559 Vitamin D deficiency, unspecified: Secondary | ICD-10-CM

## 2017-09-03 DIAGNOSIS — J0140 Acute pansinusitis, unspecified: Secondary | ICD-10-CM | POA: Diagnosis not present

## 2017-09-03 DIAGNOSIS — D649 Anemia, unspecified: Secondary | ICD-10-CM | POA: Diagnosis not present

## 2017-09-03 DIAGNOSIS — B2790 Infectious mononucleosis, unspecified without complication: Secondary | ICD-10-CM | POA: Diagnosis not present

## 2017-09-03 DIAGNOSIS — E11649 Type 2 diabetes mellitus with hypoglycemia without coma: Secondary | ICD-10-CM

## 2017-09-03 MED ORDER — DOXYCYCLINE HYCLATE 100 MG PO TABS: 100.0000 mg | ORAL_TABLET | Freq: Two times a day (BID) | ORAL | 0 refills | Status: DC

## 2017-09-03 MED ORDER — ACYCLOVIR 400 MG PO TABS: ORAL_TABLET | ORAL | 3 refills | Status: DC

## 2017-09-03 NOTE — Progress Notes (Signed)
[facility] [place], [place] [address]  Internal MEDICINE  Office Visit Note  Patient Name: Danielle Wallace  [id number]  [id number]  Date of Service: 09/23/2017  Chief Complaint  Patient presents with  . Adenopathy    feels like sores low grade fevers,cough     The patient has noted increased pain in neck with swollen lymph nodes over past several days. She has started to feel very fatigued with body aches. No fever, but she has chills and sometimes cannot get warm. 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 Taking acyclovir and prednisone in the past has helped these symptoms. Seem to get worse when treatment is complete.   Pt is here for a sick visit.     Current Medication:  Outpatient Encounter Medications as of 09/03/2017  Medication Sig Note  . Calcium Carb-Cholecalciferol (CALCIUM + D3) 600-200 MG-UNIT TABS Take 1 tablet by mouth 2 (two) times daily.   . canagliflozin (INVOKANA) 300 MG TABS tablet TAKE 1 TABLET BY MOUTH EVERY DAY 11/29/2014: Received from: [facility]  . dextromethorphan-guaiFENesin (MUCINEX DM) 30-600 MG 12hr tablet Take 1 tablet by mouth 2 (two) times daily.   . diphenhydrAMINE (BENADRYL) 25 MG tablet Take 25 mg by mouth every 6 (six) hours as needed.   . etodolac (LODINE) 400 MG tablet TAKE 1 TABLET BY MOUTH 3 TIMES A DAY AS NEEDED FOR PAIN 03/22/2015: Received from: [facility]  . fluticasone (FLONASE) 50 MCG/ACT nasal spray Place into both nostrils daily.   Marland Kitchen gabapentin (NEURONTIN) 600 MG tablet Take 600 mg by mouth 3 (three) times daily. 03/22/2015: Received from: External Pharmacy  . glipiZIDE (GLUCOTROL XL) 5 MG 24 hr tablet Take 2.5 mg by mouth daily with supper.   Marland Kitchen imipramine (TOFRANIL) 25 MG tablet Take 25 mg by mouth at bedtime.   Marland Kitchen levocetirizine (XYZAL) 5 MG tablet Take 5 mg by mouth every evening.   . linaclotide (LINZESS) 145 MCG CAPS capsule Take 145 mcg by mouth daily  before breakfast.   . metFORMIN (GLUCOPHAGE) 500 MG tablet Take 2,000 mg by mouth every evening.    . montelukast (SINGULAIR) 10 MG tablet TAKE 1 TABLET BY MOUTH EVERY DAY   . nadolol (CORGARD) 40 MG tablet Take 1 tablet (40 mg total) by mouth daily.   Marland Kitchen omeprazole (PRILOSEC) 40 MG capsule TAKE 1 CAPSULE BY MOUTH EVERY DAY   . PROAIR HFA 108 (90 Base) MCG/ACT inhaler 2 puffs 2 (two) times daily as needed.   . tamoxifen (NOLVADEX) 20 MG tablet TAKE 1 TABLET BY MOUTH EVERY DAY   . valsartan-hydrochlorothiazide (DIOVAN-HCT) 80-12.5 MG per tablet Take 1 tablet by mouth daily.   . [DISCONTINUED] acyclovir (ZOVIRAX) 400 MG tablet Take 1 tablet po BID for 10 days then take 1 tablet po QD (Patient taking differently: Take 400 mg by mouth daily. Take 1 tablet po BID for 10 days then take 1 tablet po QD)   . [DISCONTINUED] acyclovir (ZOVIRAX) 400 MG tablet Take 1 tablet po BID   . [DISCONTINUED] azithromycin (ZITHROMAX) 250 MG tablet z-pack - take as directed for 5 days   . doxycycline (VIBRA-TABS) 100 MG tablet Take 1 tablet (100 mg total) by mouth 2 (two) times daily.    No facility-administered encounter medications on file as of 09/03/2017.       Medical History: Past Medical History:  Diagnosis Date  . Arthritis 2006  . Asthma   . Breast cancer ([place])  10/14/2013   18 mm, T1c, N0; ER/ PR positive,her 2 neu overexpressed. Adjuvant chemo/ herceptin.  . Cancer [facility]) 2006   Renal cell carcinoma; cryosurgery treatment, right side  . Cirrhosis ([place])   . Collapsed lung 2007  . Diabetes mellitus without complication ([place]) 0539   Metformin  . Diffuse cystic mastopathy   . Motion sickness    any moving vehicle  . Orthodontics    braces  . Personal history of chemotherapy   . Sinus problem      Vital Signs: BP 132/74 (BP Location: Right Arm, Patient Position: Sitting, Cuff Size: Normal)   Pulse 81   Temp (!) 97 F (36.1 C)   Resp 16   Ht 5' 5"  (1.651 m)   Wt 209 lb (94.8 kg)   SpO2 96%    BMI 34.78 kg/m    Review of Systems  Constitutional: Positive for chills and fatigue. Negative for fever.  HENT: Positive for congestion and rhinorrhea. Negative for postnasal drip and sore throat.   Eyes: Negative for redness and itching.  Respiratory: Positive for cough. Negative for shortness of breath and wheezing.   Cardiovascular: Negative for chest pain, palpitations and leg swelling.  Gastrointestinal: Positive for nausea.  Endocrine:       Blood sugars doing well   Genitourinary: Negative for dysuria, flank pain, frequency, hematuria and urgency.  Musculoskeletal: Positive for myalgias and neck pain.  Skin: Negative for rash.  Allergic/Immunologic: Positive for environmental allergies.  Neurological: Positive for light-headedness and headaches.  Hematological: Positive for adenopathy.  Psychiatric/Behavioral: Positive for sleep disturbance. Negative for dysphoric mood. The patient is not nervous/anxious.     Physical Exam  Constitutional: She is oriented to person, place, and time. She appears well-developed and well-nourished. She appears ill.  HENT:  Head: Normocephalic and atraumatic.  Right Ear: Tympanic membrane is erythematous and bulging.  Left Ear: External ear and ear canal normal. Tympanic membrane is erythematous and bulging.  Nose: Rhinorrhea present. Right sinus exhibits maxillary sinus tenderness. Left sinus exhibits maxillary sinus tenderness.  Mouth/Throat: Posterior oropharyngeal edema and posterior oropharyngeal erythema present.  Eyes: Pupils are equal, round, and reactive to light. Conjunctivae and EOM are normal.  Neck: Neck supple. Decreased range of motion present.  Cardiovascular: Normal rate, regular rhythm and normal heart sounds.  Pulmonary/Chest: Effort normal and breath sounds normal. She has no wheezes.  Mild, dry cough present.   Abdominal: Soft. Bowel sounds are normal. There is no tenderness.  Lymphadenopathy:       Head (right side):  Occipital adenopathy present.       Head (left side): Occipital adenopathy present.    She has cervical adenopathy.       Right cervical: Superficial cervical and posterior cervical adenopathy present.       Left cervical: Superficial cervical and posterior cervical adenopathy present.  Neurological: She is alert and oriented to person, place, and time. She displays normal reflexes. No cranial nerve deficit.  Skin: Skin is warm and dry.  Psychiatric: She has a normal mood and affect. Her behavior is normal. Judgment and thought content normal.  Nursing note and vitals reviewed.  Assessment/Plan: 1. Acute non-recurrent pansinusitis Treat with doxycycline twice daily for next 10 days. Rest and increase fluids. OTC medication to alleviate symptoms.  - doxycycline (VIBRA-TABS) 100 MG tablet; Take 1 tablet (100 mg total) by mouth 2 (two) times daily.  Dispense: 20 tablet; Refill: 0  2. [place] Patient virus infection Continue daily acyclovir.  - Epstein-Barr  virus VCA antibody panel  3. Other fatigue Check labs for further evaluation.  - CBC with Differential/Platelet - Comprehensive metabolic panel - Ferritin - Vitamin B12 - TSH - T4, free - Epstein-Barr virus VCA antibody panel - Thyroid peroxidase antibody  4. Uncontrolled type 2 diabetes mellitus with hypoglycemia, unspecified hypoglycemia coma status (HCC) No change in diabetic medication.  - Comprehensive metabolic panel  5. Vitamin D deficiency - Vitamin D 1,25 dihydroxy  6. Anemia, unspecified type - CBC with Differential/Platelet - Ferritin - Vitamin B12  General Counseling: Jersi verbalizes understanding of the findings of todays visit and agrees with plan of treatment. I have discussed any further diagnostic evaluation that may be needed or ordered today. We also reviewed her medications today. she has been encouraged to call the office with any questions or concerns that should arise related to todays visit.   Rest  and increase fluids. Continue using OTC medication to control symptoms.   This patient was seen by Leretha Pol, FNP- C in Collaboration with Dr Lavera Guise as a part of collaborative care agreement    Orders Placed This Encounter  Procedures  . CBC with Differential/Platelet  . Comprehensive metabolic panel  . Ferritin  . Vitamin B12  . TSH  . T4, free  . Epstein-Barr virus VCA antibody panel  . Vitamin D 1,25 dihydroxy  . Thyroid peroxidase antibody    Meds ordered this encounter  Medications  . DISCONTD: acyclovir (ZOVIRAX) 400 MG tablet    Sig: Take 1 tablet po BID    Dispense:  40 tablet    Refill:  3    Please note increased dose.    Order Specific Question:   Supervising Provider    Answer:   Lavera Guise [[id number]]  . doxycycline (VIBRA-TABS) 100 MG tablet    Sig: Take 1 tablet (100 mg total) by mouth 2 (two) times daily.    Dispense:  20 tablet    Refill:  0    Order Specific Question:   Supervising Provider    Answer:   Lavera Guise [[id number]]    Time spent: 20 Minutes

## 2017-09-07 ENCOUNTER — Other Ambulatory Visit: Payer: Self-pay

## 2017-09-07 DIAGNOSIS — B2790 Infectious mononucleosis, unspecified without complication: Secondary | ICD-10-CM

## 2017-09-07 MED ORDER — ACYCLOVIR 400 MG PO TABS: ORAL_TABLET | ORAL | 3 refills | Status: DC

## 2017-09-15 DIAGNOSIS — I10 Essential (primary) hypertension: Secondary | ICD-10-CM | POA: Diagnosis not present

## 2017-09-15 DIAGNOSIS — E663 Overweight: Secondary | ICD-10-CM | POA: Diagnosis not present

## 2017-09-15 DIAGNOSIS — R5383 Other fatigue: Secondary | ICD-10-CM | POA: Diagnosis not present

## 2017-09-15 DIAGNOSIS — E119 Type 2 diabetes mellitus without complications: Secondary | ICD-10-CM | POA: Diagnosis not present

## 2017-09-21 ENCOUNTER — Encounter: Payer: Self-pay | Admitting: Nurse Practitioner

## 2017-09-21 NOTE — Telephone Encounter (Signed)
I answered her message back directly back to her. I might send in pred pack. I will see what she ways back.

## 2017-09-22 ENCOUNTER — Other Ambulatory Visit: Payer: Self-pay | Admitting: Nurse Practitioner

## 2017-09-22 DIAGNOSIS — C50412 Malignant neoplasm of upper-outer quadrant of left female breast: Secondary | ICD-10-CM

## 2017-09-22 MED ORDER — PREDNISONE 10 MG (48) PO TBPK
ORAL_TABLET | ORAL | 0 refills | Status: DC
Start: 2017-09-22 — End: 2017-10-01

## 2017-09-22 NOTE — Progress Notes (Signed)
12 day prednisone taper sent to [facility]

## 2017-09-23 DIAGNOSIS — D649 Anemia, unspecified: Secondary | ICD-10-CM | POA: Insufficient documentation

## 2017-09-23 DIAGNOSIS — E559 Vitamin D deficiency, unspecified: Secondary | ICD-10-CM | POA: Insufficient documentation

## 2017-09-23 DIAGNOSIS — R5383 Other fatigue: Secondary | ICD-10-CM | POA: Insufficient documentation

## 2017-09-30 DIAGNOSIS — R6883 Chills (without fever): Secondary | ICD-10-CM | POA: Diagnosis not present

## 2017-09-30 DIAGNOSIS — J329 Chronic sinusitis, unspecified: Secondary | ICD-10-CM | POA: Diagnosis not present

## 2017-09-30 DIAGNOSIS — R5383 Other fatigue: Secondary | ICD-10-CM | POA: Diagnosis not present

## 2017-09-30 DIAGNOSIS — R894 Abnormal immunological findings in specimens from other organs, systems and tissues: Secondary | ICD-10-CM | POA: Diagnosis not present

## 2017-10-01 ENCOUNTER — Encounter: Payer: Self-pay | Admitting: Nurse Practitioner

## 2017-10-01 ENCOUNTER — Ambulatory Visit: Payer: BLUE CROSS/BLUE SHIELD | Admitting: Nurse Practitioner

## 2017-10-01 VITALS — BP 129/76 | HR 97 | Temp 98.3°F | Resp 16 | Ht 66.0 in | Wt 204.4 lb

## 2017-10-01 DIAGNOSIS — J029 Acute pharyngitis, unspecified: Secondary | ICD-10-CM

## 2017-10-01 DIAGNOSIS — E119 Type 2 diabetes mellitus without complications: Secondary | ICD-10-CM

## 2017-10-01 DIAGNOSIS — R062 Wheezing: Secondary | ICD-10-CM

## 2017-10-01 DIAGNOSIS — J209 Acute bronchitis, unspecified: Secondary | ICD-10-CM | POA: Diagnosis not present

## 2017-10-01 MED ORDER — PREDNISONE 10 MG (21) PO TBPK: ORAL_TABLET | ORAL | 0 refills | Status: DC

## 2017-10-01 MED ORDER — ALBUTEROL SULFATE (2.5 MG/3ML) 0.083% IN NEBU
2.5000 mg | INHALATION_SOLUTION | Freq: Once | RESPIRATORY_TRACT | Status: AC
  Administered 2017-10-01: 2.5 mg via RESPIRATORY_TRACT

## 2017-10-01 MED ORDER — MAGIC MOUTHWASH: 5.0000 mL | Freq: Four times a day (QID) | ORAL | 0 refills | Status: DC | PRN

## 2017-10-01 MED ORDER — CLARITHROMYCIN 500 MG PO TABS: 500.0000 mg | ORAL_TABLET | Freq: Two times a day (BID) | ORAL | 0 refills | Status: DC

## 2017-10-01 NOTE — Progress Notes (Signed)
[facility] [place], [place] [address]  Internal MEDICINE  Office Visit Note  Patient Name: Danielle Wallace  [id number]  [id number]  Date of Service: 10/01/2017   Pt is here for a sick visit.  Chief Complaint  Patient presents with  . Cough  . Sore Throat  . Nasal Congestion     Cough  This is a new problem. The current episode started in the past 7 days. The problem has been gradually worsening. The problem occurs every few minutes. The cough is non-productive. Associated symptoms include chills, ear congestion, a fever, headaches, myalgias, nasal congestion, postnasal drip, rhinorrhea, a sore throat, shortness of breath and wheezing. Pertinent negatives include no chest pain, eye redness or rash. Nothing aggravates the symptoms. She has tried a beta-agonist inhaler, OTC cough suppressant and rest for the symptoms. The treatment provided mild relief. Her past medical history is significant for asthma and environmental allergies.        Current Medication:  Outpatient Encounter Medications as of 10/01/2017  Medication Sig Note  . acyclovir (ZOVIRAX) 400 MG tablet Take 1 tablet po BID for 10 days then take 1 tablet daily   . Calcium Carb-Cholecalciferol (CALCIUM + D3) 600-200 MG-UNIT TABS Take 1 tablet by mouth 2 (two) times daily.   . canagliflozin (INVOKANA) 300 MG TABS tablet TAKE 1 TABLET BY MOUTH EVERY DAY 11/29/2014: Received from: [facility]  . clarithromycin (BIAXIN) 500 MG tablet Take 1 tablet (500 mg total) by mouth 2 (two) times daily.   Marland Kitchen dextromethorphan-guaiFENesin (MUCINEX DM) 30-600 MG 12hr tablet Take 1 tablet by mouth 2 (two) times daily.   . diphenhydrAMINE (BENADRYL) 25 MG tablet Take 25 mg by mouth every 6 (six) hours as needed.   . doxycycline (VIBRA-TABS) 100 MG tablet Take 1 tablet (100 mg total) by mouth 2 (two) times daily.   Marland Kitchen etodolac (LODINE) 400 MG tablet TAKE 1 TABLET BY MOUTH 3 TIMES A DAY AS NEEDED FOR  PAIN 03/22/2015: Received from: [facility]  . fluticasone (FLONASE) 50 MCG/ACT nasal spray Place into both nostrils daily.   Marland Kitchen gabapentin (NEURONTIN) 600 MG tablet Take 600 mg by mouth 3 (three) times daily. 03/22/2015: Received from: External Pharmacy  . glipiZIDE (GLUCOTROL XL) 5 MG 24 hr tablet Take 2.5 mg by mouth daily with supper.   Marland Kitchen imipramine (TOFRANIL) 25 MG tablet Take 25 mg by mouth at bedtime.   Marland Kitchen levocetirizine (XYZAL) 5 MG tablet Take 5 mg by mouth every evening.   . linaclotide (LINZESS) 145 MCG CAPS capsule Take 145 mcg by mouth daily before breakfast.   . magic mouthwash SOLN Take 5 mLs by mouth 4 (four) times daily as needed for mouth pain.   . metFORMIN (GLUCOPHAGE) 500 MG tablet Take 2,000 mg by mouth every evening.    . montelukast (SINGULAIR) 10 MG tablet TAKE 1 TABLET BY MOUTH EVERY DAY   . nadolol (CORGARD) 40 MG tablet Take 1 tablet (40 mg total) by mouth daily.   Marland Kitchen omeprazole (PRILOSEC) 40 MG capsule TAKE 1 CAPSULE BY MOUTH EVERY DAY   . predniSONE (STERAPRED UNI-PAK 21 TAB) 10 MG (21) TBPK tablet 6 day taper - take by mouth as directed for 6 days   . PROAIR HFA 108 (90 Base) MCG/ACT inhaler 2 puffs 2 (two) times daily as needed.   . tamoxifen (NOLVADEX) 20 MG tablet TAKE 1 TABLET BY MOUTH EVERY DAY   . valsartan-hydrochlorothiazide (DIOVAN-HCT) 80-12.5 MG per  tablet Take 1 tablet by mouth daily.   . [DISCONTINUED] predniSONE (STERAPRED UNI-PAK 48 TAB) 10 MG (48) TBPK tablet 12 day taper - take by mouth as directed for 12 days.   . [EXPIRED] albuterol (PROVENTIL) (2.5 MG/3ML) 0.083% nebulizer solution 2.5 mg     No facility-administered encounter medications on file as of 10/01/2017.       Medical History: Past Medical History:  Diagnosis Date  . Arthritis 2006  . Asthma   . Breast cancer ([place]) 10/14/2013   18 mm, T1c, N0; ER/ PR positive,her 2 neu overexpressed. Adjuvant chemo/ herceptin.  . Cancer [facility]) 2006   Renal cell carcinoma;  cryosurgery treatment, right side  . Cirrhosis ([place])   . Collapsed lung 2007  . Diabetes mellitus without complication ([place]) 5102   Metformin  . Diffuse cystic mastopathy   . Motion sickness    any moving vehicle  . Orthodontics    braces  . Personal history of chemotherapy   . Sinus problem      Vital Signs: Today's Vitals   10/01/17 1118  BP: 129/76  Pulse: 97  Resp: 16  Temp: 98.3 F (36.8 C)  SpO2: 93%  Weight: 204 lb 6.4 oz (92.7 kg)  Height: 5' 6"  (1.676 m)   Review of Systems  Constitutional: Positive for activity change, chills, fatigue and fever.  HENT: Positive for congestion, postnasal drip, rhinorrhea, sore throat and voice change.   Eyes: Negative for redness and itching.  Respiratory: Positive for cough, shortness of breath and wheezing.   Cardiovascular: Negative for chest pain, palpitations and leg swelling.  Gastrointestinal: Positive for nausea. Negative for vomiting.  Endocrine:       Blood sugars doing well   Musculoskeletal: Positive for myalgias.  Skin: Negative for rash.  Allergic/Immunologic: Positive for environmental allergies.  Neurological: Positive for headaches.  Hematological: Positive for adenopathy.    Physical Exam  Constitutional: She is oriented to person, place, and time. She appears well-developed and well-nourished. She appears ill.  HENT:  Head: Normocephalic and atraumatic.  Right Ear: Ear canal normal. Tympanic membrane is erythematous and bulging.  Left Ear: External ear and ear canal normal. Tympanic membrane is erythematous and bulging.  Nose: Rhinorrhea present. Right sinus exhibits maxillary sinus tenderness. Left sinus exhibits maxillary sinus tenderness.  Mouth/Throat: Mucous membranes are normal. Posterior oropharyngeal edema and posterior oropharyngeal erythema present.  Eyes: Pupils are equal, round, and reactive to light.  Neck: Neck supple. Decreased range of motion present.  Cardiovascular: Normal rate,  regular rhythm and normal heart sounds.  Pulmonary/Chest: Effort normal. She has wheezes.  Congested, non-productive cough present. Mild respiratory distress.   Abdominal: Soft. Bowel sounds are normal. There is no tenderness.  Lymphadenopathy:       Head (right side): Occipital adenopathy present.       Head (left side): Occipital adenopathy present.    She has cervical adenopathy.       Right cervical: Superficial cervical and posterior cervical adenopathy present.       Left cervical: Superficial cervical and posterior cervical adenopathy present.  Neurological: She is alert and oriented to person, place, and time.  Skin: Skin is warm and dry.  Psychiatric: She has a normal mood and affect. Her behavior is normal. Judgment and thought content normal.  Nursing note and vitals reviewed.  Assessment/Plan:  1. Acute bronchitis, unspecified organism Start clarithromycin 535m bid for 10 days. Add prednisone 1101m si day taper. Samples mucinex DM provided. May use twice daily  as needed  - predniSONE (STERAPRED UNI-PAK 21 TAB) 10 MG (21) TBPK tablet; 6 day taper - take by mouth as directed for 6 days  Dispense: 21 tablet; Refill: 0 - clarithromycin (BIAXIN) 500 MG tablet; Take 1 tablet (500 mg total) by mouth 2 (two) times daily.  Dispense: 20 tablet; Refill: 0  2. Wheezing Breathing treatment with albuterol given in the office with moderate improvement of symptoms. Prednisone 40m, six day taper to improve wheezing. - albuterol (PROVENTIL) (2.5 MG/3ML) 0.083% nebulizer solution 2.5 mg - predniSONE (STERAPRED UNI-PAK 21 TAB) 10 MG (21) TBPK tablet; 6 day taper - take by mouth as directed for 6 days  Dispense: 21 tablet; Refill: 0  3. Sore throat Swish and swallow 523m four times daily as needed for sore throat - magic mouthwash SOLN; Take 5 mLs by mouth 4 (four) times daily as needed for mouth pain.  Dispense: 300 mL; Refill: 0  4. Diabetes [place]  monitor blood sugars while on prednisone taper. Will treat hyperglycemia as indicated    General Counseling: MiLoleta Chancenderstanding of the findings of todays visit and agrees with plan of treatment. I have discussed any further diagnostic evaluation that may be needed or ordered today. We also reviewed her medications today. she has been encouraged to call the office with any questions or concerns that should arise related to todays visit.   Rest and increase fluids. Continue using OTC medication to control symptoms.   This patient was seen by HeLeretha PolFNP- C in Collaboration with Dr FoLavera Guises a part of collaborative care agreement  Meds ordered this encounter  Medications  . albuterol (PROVENTIL) (2.5 MG/3ML) 0.083% nebulizer solution 2.5 mg  . predniSONE (STERAPRED UNI-PAK 21 TAB) 10 MG (21) TBPK tablet    Sig: 6 day taper - take by mouth as directed for 6 days    Dispense:  21 tablet    Refill:  0    Order Specific Question:   Supervising Provider    Answer:   KHLavera Guise1[place]. clarithromycin (BIAXIN) 500 MG tablet    Sig: Take 1 tablet (500 mg total) by mouth 2 (two) times daily.    Dispense:  20 tablet    Refill:  0    Order Specific Question:   Supervising Provider    Answer:   KHLavera Guise1[[id number]]. magic mouthwash SOLN    Sig: Take 5 mLs by mouth 4 (four) times daily as needed for mouth pain.    Dispense:  300 mL    Refill:  0    Order Specific Question:   Supervising Provider    Answer:   KHLavera Guise1[[id number]]  Time spent: 25 Minutes

## 2017-10-06 ENCOUNTER — Ambulatory Visit
Admission: RE | Admit: 2017-10-06 | Discharge: 2017-10-06 | Disposition: A | Discharge status: Home / Self Care | Payer: BLUE CROSS/BLUE SHIELD | Source: Ambulatory Visit | Attending: Nurse Practitioner | Admitting: Nurse Practitioner

## 2017-10-06 ENCOUNTER — Telehealth: Payer: Self-pay

## 2017-10-06 ENCOUNTER — Ambulatory Visit: Payer: BLUE CROSS/BLUE SHIELD | Admitting: Nurse Practitioner

## 2017-10-06 ENCOUNTER — Encounter: Payer: Self-pay | Admitting: Nurse Practitioner

## 2017-10-06 VITALS — BP 127/77 | HR 79 | Temp 96.7°F | Resp 16 | Ht 66.0 in | Wt 204.0 lb

## 2017-10-06 DIAGNOSIS — R05 Cough: Secondary | ICD-10-CM | POA: Diagnosis not present

## 2017-10-06 DIAGNOSIS — J209 Acute bronchitis, unspecified: Secondary | ICD-10-CM

## 2017-10-06 DIAGNOSIS — R0602 Shortness of breath: Secondary | ICD-10-CM | POA: Diagnosis not present

## 2017-10-06 DIAGNOSIS — R062 Wheezing: Secondary | ICD-10-CM

## 2017-10-06 DIAGNOSIS — E119 Type 2 diabetes mellitus without complications: Secondary | ICD-10-CM | POA: Diagnosis not present

## 2017-10-06 DIAGNOSIS — R5383 Other fatigue: Secondary | ICD-10-CM

## 2017-10-06 MED ORDER — ALBUTEROL SULFATE (2.5 MG/3ML) 0.083% IN NEBU: 2.5000 mg | INHALATION_SOLUTION | Freq: Four times a day (QID) | RESPIRATORY_TRACT | 1 refills | Status: DC | PRN

## 2017-10-06 MED ORDER — ALBUTEROL SULFATE (2.5 MG/3ML) 0.083% IN NEBU
2.5000 mg | INHALATION_SOLUTION | Freq: Once | RESPIRATORY_TRACT | Status: AC
  Administered 2017-10-06: 2.5 mg via RESPIRATORY_TRACT

## 2017-10-06 MED ORDER — PREDNISONE 10 MG (48) PO TBPK: ORAL_TABLET | ORAL | 0 refills | Status: DC

## 2017-10-06 MED ORDER — ALBUTEROL SULFATE (2.5 MG/3ML) 0.083% IN NEBU: 2.5000 mg | INHALATION_SOLUTION | Freq: Four times a day (QID) | RESPIRATORY_TRACT | 3 refills | Status: DC | PRN

## 2017-10-06 NOTE — Telephone Encounter (Signed)
SPOKE WITH ASHLEY FROM [facility] THAT WE PLACE ORDER FOR NEBULIZER

## 2017-10-06 NOTE — Progress Notes (Signed)
[facility] [place], [place] [address]  Internal MEDICINE  Office Visit Note  Patient Name: Danielle Wallace  [id number]  [id number]  Date of Service: 10/07/2017   Pt is here for a sick visit.  Chief Complaint  Patient presents with  . URI    still have a bad cough , feel like in a fog      The patient is currently on her 5th day of biaxin from recent visit. She finished 6 day prednisone taper this morning. She feels just slightly better. Still has cough, shortness of breath, and wheezing. Has been using the rescue inhaler multiple times per day with little relief.   Cough  This is a new problem. The current episode started 1 to 4 weeks ago. The problem has been gradually improving. The problem occurs every few minutes. The cough is non-productive. Associated symptoms include chills, ear congestion, a fever, headaches, myalgias, nasal congestion, postnasal drip, rhinorrhea, a sore throat, shortness of breath and wheezing. Pertinent negatives include no chest pain, eye redness or rash. Nothing aggravates the symptoms. She has tried a beta-agonist inhaler, OTC cough suppressant and rest for the symptoms. The treatment provided mild relief. Her past medical history is significant for asthma and environmental allergies.        Current Medication:  Outpatient Encounter Medications as of 10/06/2017  Medication Sig Note  . acyclovir (ZOVIRAX) 400 MG tablet Take 1 tablet po BID for 10 days then take 1 tablet daily   . albuterol (PROVENTIL) (2.5 MG/3ML) 0.083% nebulizer solution Take 3 mLs (2.5 mg total) by nebulization every 6 (six) hours as needed for wheezing or shortness of breath.   . Calcium Carb-Cholecalciferol (CALCIUM + D3) 600-200 MG-UNIT TABS Take 1 tablet by mouth 2 (two) times daily.   . canagliflozin (INVOKANA) 300 MG TABS tablet TAKE 1 TABLET BY MOUTH EVERY DAY 11/29/2014: Received from: [facility]  . clarithromycin (BIAXIN) 500  MG tablet Take 1 tablet (500 mg total) by mouth 2 (two) times daily.   Marland Kitchen dextromethorphan-guaiFENesin (MUCINEX DM) 30-600 MG 12hr tablet Take 1 tablet by mouth 2 (two) times daily.   . diphenhydrAMINE (BENADRYL) 25 MG tablet Take 25 mg by mouth every 6 (six) hours as needed.   . doxycycline (VIBRA-TABS) 100 MG tablet Take 1 tablet (100 mg total) by mouth 2 (two) times daily.   Marland Kitchen etodolac (LODINE) 400 MG tablet TAKE 1 TABLET BY MOUTH 3 TIMES A DAY AS NEEDED FOR PAIN 03/22/2015: Received from: [facility]  . fluticasone (FLONASE) 50 MCG/ACT nasal spray Place into both nostrils daily.   Marland Kitchen gabapentin (NEURONTIN) 600 MG tablet Take 600 mg by mouth 3 (three) times daily. 03/22/2015: Received from: External Pharmacy  . glipiZIDE (GLUCOTROL XL) 5 MG 24 hr tablet Take 2.5 mg by mouth daily with supper.   Marland Kitchen imipramine (TOFRANIL) 25 MG tablet Take 25 mg by mouth at bedtime.   Marland Kitchen levocetirizine (XYZAL) 5 MG tablet Take 5 mg by mouth every evening.   . linaclotide (LINZESS) 145 MCG CAPS capsule Take 145 mcg by mouth daily before breakfast.   . magic mouthwash SOLN Take 5 mLs by mouth 4 (four) times daily as needed for mouth pain.   . metFORMIN (GLUCOPHAGE) 500 MG tablet Take 2,000 mg by mouth every evening.    . montelukast (SINGULAIR) 10 MG tablet TAKE 1 TABLET BY MOUTH EVERY DAY   . nadolol (CORGARD) 40 MG tablet Take 1 tablet (40  mg total) by mouth daily.   Marland Kitchen omeprazole (PRILOSEC) 40 MG capsule TAKE 1 CAPSULE BY MOUTH EVERY DAY   . predniSONE (STERAPRED UNI-PAK 48 TAB) 10 MG (48) TBPK tablet 12 day taper - take by mouth as directed for 12 days   . PROAIR HFA 108 (90 Base) MCG/ACT inhaler 2 puffs 2 (two) times daily as needed.   . tamoxifen (NOLVADEX) 20 MG tablet TAKE 1 TABLET BY MOUTH EVERY DAY   . valsartan-hydrochlorothiazide (DIOVAN-HCT) 80-12.5 MG per tablet Take 1 tablet by mouth daily.   . [DISCONTINUED] albuterol (PROVENTIL) (2.5 MG/3ML) 0.083% nebulizer solution Take 3 mLs (2.5 mg  total) by nebulization every 6 (six) hours as needed for wheezing or shortness of breath.   . [DISCONTINUED] predniSONE (STERAPRED UNI-PAK 21 TAB) 10 MG (21) TBPK tablet 6 day taper - take by mouth as directed for 6 days (Patient not taking: Reported on 10/06/2017)   . [EXPIRED] albuterol (PROVENTIL) (2.5 MG/3ML) 0.083% nebulizer solution 2.5 mg     No facility-administered encounter medications on file as of 10/06/2017.       Medical History: Past Medical History:  Diagnosis Date  . Arthritis 2006  . Asthma   . Breast cancer ([place]) 10/14/2013   18 mm, T1c, N0; ER/ PR positive,her 2 neu overexpressed. Adjuvant chemo/ herceptin.  . Cancer [facility]) 2006   Renal cell carcinoma; cryosurgery treatment, right side  . Cirrhosis ([place])   . Collapsed lung 2007  . Diabetes mellitus without complication ([place]) 2440   Metformin  . Diffuse cystic mastopathy   . Motion sickness    any moving vehicle  . Orthodontics    braces  . Personal history of chemotherapy   . Sinus problem      Vital Signs: BP 127/77   Pulse 79   Temp (!) 96.7 F (35.9 C)   Resp 16   Ht 5' 6"  (1.676 m)   Wt 204 lb (92.5 kg)   SpO2 97%   BMI 32.93 kg/m    Review of Systems  Constitutional: Positive for activity change, chills, fatigue and fever.  HENT: Positive for congestion, postnasal drip, rhinorrhea, sore throat and voice change.   Eyes: Negative for redness and itching.  Respiratory: Positive for cough, shortness of breath and wheezing.   Cardiovascular: Negative for chest pain, palpitations and leg swelling.  Gastrointestinal: Positive for nausea. Negative for vomiting.  Endocrine:       Blood sugars doing well   Musculoskeletal: Positive for myalgias.  Skin: Negative for rash.  Allergic/Immunologic: Positive for environmental allergies.  Neurological: Positive for headaches.  Hematological: Positive for adenopathy.    Physical Exam  Constitutional: She is oriented to person, place, and time. She  appears well-developed and well-nourished. She appears ill.  HENT:  Head: Normocephalic and atraumatic.  Right Ear: Ear canal normal. Tympanic membrane is erythematous and bulging.  Left Ear: External ear and ear canal normal. Tympanic membrane is erythematous and bulging.  Nose: Rhinorrhea present. Right sinus exhibits maxillary sinus tenderness. Left sinus exhibits maxillary sinus tenderness.  Mouth/Throat: Mucous membranes are normal. Posterior oropharyngeal edema and posterior oropharyngeal erythema present.  Eyes: Pupils are equal, round, and reactive to light.  Neck: Neck supple. Decreased range of motion present.  Cardiovascular: Normal rate, regular rhythm and normal heart sounds.  Pulmonary/Chest: Effort normal. She has wheezes.  Congested, non-productive cough present. Mild respiratory distress.   Abdominal: Soft. Bowel sounds are normal. There is no tenderness.  Lymphadenopathy:  Head (right side): Occipital adenopathy present.       Head (left side): Occipital adenopathy present.    She has cervical adenopathy.       Right cervical: Superficial cervical and posterior cervical adenopathy present.       Left cervical: Superficial cervical and posterior cervical adenopathy present.  Neurological: She is alert and oriented to person, place, and time.  Skin: Skin is warm and dry.  Psychiatric: She has a normal mood and affect. Her behavior is normal. Judgment and thought content normal.  Nursing note and vitals reviewed.  Assessment/Plan: 1. Acute bronchitis, unspecified organism conitnue biazin bid until prescription finished. Will add 12 day prednisone taper. Take as directed. Will get chest x-ray for further evaluation.  - DG Chest 2 View; Future - albuterol (PROVENTIL) (2.5 MG/3ML) 0.083% nebulizer solution 2.5 mg - predniSONE (STERAPRED UNI-PAK 48 TAB) 10 MG (48) TBPK tablet; 12 day taper - take by mouth as directed for 12 days  Dispense: 48 tablet; Refill: 0  2.  Wheezing Albuterol nebulizer treatment administered in the office. Mild improvement of symptoms after treatment. Will place order for home nebulizer with albuterol solution which may be used up to 4 times daily if needed for wheezing and shortness of breath. Will get chest x-ray for further evaluation.  - DG Chest 2 View; Future - predniSONE (STERAPRED UNI-PAK 48 TAB) 10 MG (48) TBPK tablet; 12 day taper - take by mouth as directed for 12 days  Dispense: 48 tablet; Refill: 0 - albuterol (PROVENTIL) (2.5 MG/3ML) 0.083% nebulizer solution; Take 3 mLs (2.5 mg total) by nebulization every 6 (six) hours as needed for wheezing or shortness of breath.  Dispense: 360 mL; Refill: 3 - DME Nebulizer machine  3. Other fatigue Rest and increase fluids.   4. Diabetes mellitus without complication (HCC) Monitor sugars closely, especially while taking prednisone. Continue diabetic medication as prescribed.  General Counseling: Danielle Wallace understanding of the findings of todays visit and agrees with plan of treatment. I have discussed any further diagnostic evaluation that may be needed or ordered today. We also reviewed her medications today. she has been encouraged to call the office with any questions or concerns that should arise related to todays visit.  Rest and increase fluids. Continue using OTC medication to control symptoms.   This patient was seen by Leretha Pol, FNP- C in Collaboration with Dr Lavera Guise as a part of collaborative care agreement    Orders Placed This Encounter  Procedures  . DME Nebulizer machine  . DG Chest 2 View    Meds ordered this encounter  Medications  . DISCONTD: albuterol (PROVENTIL) (2.5 MG/3ML) 0.083% nebulizer solution    Sig: Take 3 mLs (2.5 mg total) by nebulization every 6 (six) hours as needed for wheezing or shortness of breath.    Dispense:  150 mL    Refill:  1  . albuterol (PROVENTIL) (2.5 MG/3ML) 0.083% nebulizer solution 2.5 mg  .  predniSONE (STERAPRED UNI-PAK 48 TAB) 10 MG (48) TBPK tablet    Sig: 12 day taper - take by mouth as directed for 12 days    Dispense:  48 tablet    Refill:  0    Order Specific Question:   Supervising Provider    Answer:   Lavera Guise [place]  . albuterol (PROVENTIL) (2.5 MG/3ML) 0.083% nebulizer solution    Sig: Take 3 mLs (2.5 mg total) by nebulization every 6 (six) hours as needed for wheezing or shortness  of breath.    Dispense:  360 mL    Refill:  3    Order Specific Question:   Supervising Provider    Answer:   Lavera Guise [[id number]]    Time spent: 25 Minutes

## 2017-10-07 ENCOUNTER — Telehealth: Payer: Self-pay | Admitting: Nurse Practitioner

## 2017-10-07 DIAGNOSIS — R062 Wheezing: Secondary | ICD-10-CM | POA: Diagnosis not present

## 2017-10-07 DIAGNOSIS — R0602 Shortness of breath: Secondary | ICD-10-CM | POA: Diagnosis not present

## 2017-10-07 NOTE — Telephone Encounter (Signed)
-----   Message from Ronnell Freshwater, NP sent at 10/07/2017  8:33 AM EDT ----- Please let the patient know that her chest x-ray is normal. Thanks.

## 2017-10-07 NOTE — Telephone Encounter (Signed)
Spoke with Danielle Wallace from [place] and he explained that the pt was non compliant. (trying to help with a nebulizer)

## 2017-10-07 NOTE — Telephone Encounter (Signed)
Pt advised that xray was normal

## 2017-10-08 ENCOUNTER — Telehealth: Payer: Self-pay | Admitting: Nurse Practitioner

## 2017-10-08 NOTE — Telephone Encounter (Signed)
Spoke with pt and she has a nebulizer that her insurance paid for.

## 2017-10-13 ENCOUNTER — Encounter: Payer: Self-pay | Admitting: Gastroenterology

## 2017-10-19 ENCOUNTER — Ambulatory Visit
Admission: RE | Admit: 2017-10-19 | Discharge: 2017-10-19 | Disposition: A | Discharge status: Home / Self Care | Payer: BLUE CROSS/BLUE SHIELD | Source: Ambulatory Visit | Attending: Internal Medicine | Admitting: Internal Medicine

## 2017-10-19 DIAGNOSIS — Z170 Estrogen receptor positive status [ER+]: Secondary | ICD-10-CM | POA: Insufficient documentation

## 2017-10-19 DIAGNOSIS — C50412 Malignant neoplasm of upper-outer quadrant of left female breast: Secondary | ICD-10-CM | POA: Insufficient documentation

## 2017-10-19 DIAGNOSIS — Z1231 Encounter for screening mammogram for malignant neoplasm of breast: Secondary | ICD-10-CM | POA: Diagnosis not present

## 2017-10-21 ENCOUNTER — Telehealth: Payer: Self-pay

## 2017-10-21 DIAGNOSIS — J329 Chronic sinusitis, unspecified: Secondary | ICD-10-CM | POA: Diagnosis not present

## 2017-10-21 DIAGNOSIS — R5381 Other malaise: Secondary | ICD-10-CM | POA: Diagnosis not present

## 2017-10-21 NOTE — Telephone Encounter (Signed)
Pt notified. Will discuss further monitoring at her follow up appt in July.

## 2017-10-21 NOTE — Telephone Encounter (Signed)
-----   Message from Lucilla Lame, MD sent at 10/15/2017  8:36 AM EDT ----- Let the patient know that her labs better but this does not cover her screening for her cirrhosis with alpha-fetoprotein and a right upper quadrant ultrasound. 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

## 2017-10-22 ENCOUNTER — Encounter: Payer: Self-pay | Admitting: Nurse Practitioner

## 2017-10-23 DIAGNOSIS — Z6834 Body mass index (BMI) 34.0-34.9, adult: Secondary | ICD-10-CM | POA: Diagnosis not present

## 2017-10-23 DIAGNOSIS — Z85528 Personal history of other malignant neoplasm of kidney: Secondary | ICD-10-CM | POA: Diagnosis not present

## 2017-10-23 DIAGNOSIS — R32 Unspecified urinary incontinence: Secondary | ICD-10-CM | POA: Diagnosis not present

## 2017-10-26 ENCOUNTER — Other Ambulatory Visit: Payer: Self-pay

## 2017-10-26 MED ORDER — OMEPRAZOLE 40 MG PO CPDR: 40.0000 mg | DELAYED_RELEASE_CAPSULE | Freq: Every day | ORAL | 3 refills | Status: DC

## 2017-10-26 MED ORDER — MONTELUKAST SODIUM 10 MG PO TABS: 10.0000 mg | ORAL_TABLET | Freq: Every day | ORAL | 3 refills | Status: DC

## 2017-10-27 ENCOUNTER — Ambulatory Visit: Payer: BLUE CROSS/BLUE SHIELD | Admitting: General Surgery

## 2017-10-27 ENCOUNTER — Encounter: Payer: Self-pay | Admitting: General Surgery

## 2017-10-27 VITALS — BP 128/70 | HR 72 | Resp 15 | Ht 66.0 in | Wt 204.0 lb

## 2017-10-27 DIAGNOSIS — Z170 Estrogen receptor positive status [ER+]: Secondary | ICD-10-CM

## 2017-10-27 DIAGNOSIS — C50412 Malignant neoplasm of upper-outer quadrant of left female breast: Secondary | ICD-10-CM

## 2017-10-27 DIAGNOSIS — R51 Headache: Secondary | ICD-10-CM | POA: Diagnosis not present

## 2017-10-27 NOTE — Patient Instructions (Addendum)
Follow up in one year with uni Right screening mammogram and office visit. Continue self breast exams. Call office for any new breast issues or concerns.

## 2017-10-27 NOTE — Progress Notes (Signed)
Patient ID: Danielle Wallace, female   DOB: 1960/12/09, 57 y.o.   MRN: [id number]  Chief Complaint  Patient presents with  . Follow-up    mammogram    HPI Danielle Wallace is a 57 y.o. female with a history of left breast cancer who presents for a breast evaluation. The most recent mammogram was done on 10/19/17. Patient does perform regular self breast checks and gets regular mammograms done. 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 She is being seen by Dr Allen Norris for liver cirrhosis. She is doing better now.   HPI  Past Medical History:  Diagnosis Date  . Arthritis 2006  . Asthma   . Breast cancer ([place]) 10/14/2013   18 mm, T1c, N0; ER/ PR positive,her 2 neu overexpressed. Adjuvant chemo/ herceptin.  . Cancer [facility]) 2006   Renal cell carcinoma; cryosurgery treatment, right side  . Cirrhosis ([place])   . Collapsed lung 2007  . Diabetes mellitus without complication ([place]) 1155   Metformin  . Diffuse cystic mastopathy   . Motion sickness    any moving vehicle  . Orthodontics    braces  . Personal history of chemotherapy   . Sinus problem     Past Surgical History:  Procedure Laterality Date  . BREAST BIOPSY Right 2005   negative  . BREAST BIOPSY Left 08/23/2007   negative  . BREAST BIOPSY Left 10/24/2013   positive  . BREAST BIOPSY Left 07/2002   neg  . BREAST BIOPSY Left 08/23/2007   neg  . BREAST SURGERY Left 10/2011   Cyst Aspirationapocrine metaplasia, ductal cells and bone cells, hypo-cellular  . BREAST SURGERY Left 2009   ADH on stereotactic biopsy, 1.5 mm focus.  Marland Kitchen BREAST SURGERY Left 2003   fibrocystic changes with ductal hyperplasia without atypia.  Marland Kitchen BREAST SURGERY Left    mastectomy  . BREAST SURGERY Left April 11, 2014   Removal of implant, debridement chest wall Dr.Coan  . CESAREAN SECTION  1991  . CHOLECYSTECTOMY  1990  . COLONOSCOPY  08/26/2013   Verdie Shire, M.D. normal.  . CRYOABLATION  2005,  2010  . CYST REMOVAL NECK  10/2011   Dr. Tami Ribas  . ESOPHAGOGASTRODUODENOSCOPY (EGD) WITH PROPOFOL N/A 01/16/2017   Procedure: ESOPHAGOGASTRODUODENOSCOPY (EGD) WITH PROPOFOL;  Surgeon: Lucilla Lame, MD;  Location: [facility];  Service: Gastroenterology;  Laterality: N/A;  Diabetic - oral meds  . INCISION AND DRAINAGE Left Dec 2015, Feb 2016   Dr Tula Nakayama  . MASTECTOMY Left 11/24/2013   positive  . PORTACATH PLACEMENT  11/24/13    Family History  Problem Relation Age of Onset  . Liver cancer Father   . Hemochromatosis Father   . Liver disease Father   . Diabetes Mother   . Hypertension Mother   . Breast cancer Paternal Aunt 96  . Ovarian cancer Maternal Grandmother   . Diabetes Sister   . Hemachromatosis Daughter     Social History Social History   Tobacco Use  . Smoking status: Never Smoker  . Smokeless tobacco: Never Used  Substance Use Topics  . Alcohol use: Not Currently    Comment: occasionally, none recently  . Drug use: No    Allergies  Allergen Reactions  . Exemestane Anaphylaxis  . Floxin [Ofloxacin] Anaphylaxis  . Gluten Meal Anaphylaxis  . Levofloxacin Anaphylaxis, Diarrhea, Itching, Nausea Only, Other (See Comments), Palpitations, Shortness Of Breath, Swelling and Tinitus  . Linezolid Anaphylaxis  . Taxotere [Docetaxel]  Anaphylaxis  . Hydrocodone Itching  . Sulfa Antibiotics Other (See Comments)    blisters Skin blisters bilateral lower extremities Other reaction(s): UNKNOWN Other reaction(s): RASH   . Codeine Rash  . Penicillins Rash  . Vancomycin Rash    Current Outpatient Medications  Medication Sig Dispense Refill  . acyclovir (ZOVIRAX) 400 MG tablet Take 1 tablet po BID for 10 days then take 1 tablet daily 40 tablet 3  . albuterol (PROVENTIL) (2.5 MG/3ML) 0.083% nebulizer solution Take 3 mLs (2.5 mg total) by nebulization every 6 (six) hours as needed for wheezing or shortness of breath. 360 mL 3  . Calcium Carb-Cholecalciferol (CALCIUM +  D3) 600-200 MG-UNIT TABS Take 1 tablet by mouth 2 (two) times daily. 180 tablet 3  . canagliflozin (INVOKANA) 300 MG TABS tablet TAKE 1 TABLET BY MOUTH EVERY DAY    . dextromethorphan-guaiFENesin (MUCINEX DM) 30-600 MG 12hr tablet Take 1 tablet by mouth 2 (two) times daily.    . diphenhydrAMINE (BENADRYL) 25 MG tablet Take 25 mg by mouth every 6 (six) hours as needed.    . etodolac (LODINE) 400 MG tablet TAKE 1 TABLET BY MOUTH 3 TIMES A DAY AS NEEDED FOR PAIN    . fluticasone (FLONASE) 50 MCG/ACT nasal spray Place into both nostrils daily.    Marland Kitchen glipiZIDE (GLUCOTROL XL) 5 MG 24 hr tablet Take 2.5 mg by mouth daily with supper.    Marland Kitchen imipramine (TOFRANIL) 25 MG tablet Take 25 mg by mouth at bedtime.    Marland Kitchen levocetirizine (XYZAL) 5 MG tablet Take 5 mg by mouth every evening.    . linaclotide (LINZESS) 145 MCG CAPS capsule Take 145 mcg by mouth daily before breakfast.    . metFORMIN (GLUCOPHAGE) 500 MG tablet Take 2,000 mg by mouth every evening.     . montelukast (SINGULAIR) 10 MG tablet Take 1 tablet (10 mg total) by mouth daily. 30 tablet 3  . nadolol (CORGARD) 40 MG tablet Take 1 tablet (40 mg total) by mouth daily. 30 tablet 11  . omeprazole (PRILOSEC) 40 MG capsule Take 1 capsule (40 mg total) by mouth daily. 30 capsule 3  . PROAIR HFA 108 (90 Base) MCG/ACT inhaler 2 puffs 2 (two) times daily as needed.    . tamoxifen (NOLVADEX) 20 MG tablet TAKE 1 TABLET BY MOUTH EVERY DAY 90 tablet 0  . valsartan-hydrochlorothiazide (DIOVAN-HCT) 80-12.5 MG per tablet Take 1 tablet by mouth daily.     No current facility-administered medications for this visit.     Review of Systems Review of Systems  Constitutional: Negative.   Respiratory: Negative.   Cardiovascular: Negative.     Blood pressure 128/70, pulse 72, resp. rate 15, height 5' 6"  (1.676 m), weight 204 lb (92.5 kg).  Physical Exam Physical Exam  Constitutional: She is oriented to person, place, and time. She appears well-developed and  well-nourished.  Eyes: Conjunctivae are normal. No scleral icterus.  Neck: Neck supple.  Cardiovascular: Normal rate, regular rhythm and normal heart sounds.  Pulmonary/Chest: Effort normal and breath sounds normal. Right breast exhibits no inverted nipple, no mass, no nipple discharge, no skin change and no tenderness.    Lymphadenopathy:    She has no cervical adenopathy.    She has no axillary adenopathy.  Neurological: She is alert and oriented to person, place, and time.  Skin: Skin is warm and dry.  Psychiatric: She has a normal mood and affect.    Data Reviewed October 19, 2017 right breast screening mammogram reviewed.  BI-RADS-1.  GI notes of August, 30, 2018 reviewed.  Possibility of cirrhosis secondary to fatty liver recorded.  Review of up-to-date literature showed that hepatic cirrhosis can be identified and 0.1-1% of patients managed with tamoxifen.  This needs to be considered in her present diagnostic work-up with no other clear etiology for her disease.  Assessment    No evidence of recurrent breast cancer.  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    Plan    Follow up in one year with uni Right screening mammogram and office visit.   Scheduled follow up with Lucilla Lame, MD on Jully 11, 2019.     HPI, Physical Exam, Assessment and Plan have been scribed under the direction and in the presence of Robert Bellow, MD  Concepcion Living, LPN  I have completed the exam and reviewed the above documentation for accuracy and completeness.  I agree with the above.  Haematologist has been used and any errors in dictation or transcription are unintentional.  Hervey Ard, M.D., F.A.C.S.   Danielle Wallace 10/27/2017, 8:04 PM

## 2017-10-28 ENCOUNTER — Telehealth: Payer: Self-pay | Admitting: *Deleted

## 2017-10-28 NOTE — Telephone Encounter (Signed)
Loreen at [facility] at [phone] contacted Nira Conn, RN at the cancer center. [facility] has been trying to reach the patient to discuss the financial cost of the breast index testing. Dr. Rogue Bussing had order this test at the last visit. Pt previously declined testing until she personally contacted her insurance to determine out of pocket cost. Pt has not yet called [facility] back. They have left multiple messages on her cell phone. Confirmed with [facility] that they have the correct pt's phone number. She asked me to reach out to the patient. I sent a mychart as well requesting pt to contact [facility].

## 2017-11-04 DIAGNOSIS — C50412 Malignant neoplasm of upper-outer quadrant of left female breast: Secondary | ICD-10-CM | POA: Diagnosis not present

## 2017-11-04 DIAGNOSIS — Z170 Estrogen receptor positive status [ER+]: Secondary | ICD-10-CM | POA: Diagnosis not present

## 2017-11-05 ENCOUNTER — Encounter: Payer: Self-pay | Admitting: Internal Medicine

## 2017-11-11 ENCOUNTER — Other Ambulatory Visit: Payer: Self-pay | Admitting: Gastroenterology

## 2017-11-19 ENCOUNTER — Encounter: Payer: Self-pay | Admitting: Gastroenterology

## 2017-11-19 ENCOUNTER — Ambulatory Visit: Payer: BLUE CROSS/BLUE SHIELD | Admitting: Gastroenterology

## 2017-11-19 VITALS — BP 123/67 | HR 78 | Ht 66.0 in | Wt 209.0 lb

## 2017-11-19 DIAGNOSIS — K7460 Unspecified cirrhosis of liver: Secondary | ICD-10-CM | POA: Diagnosis not present

## 2017-11-19 DIAGNOSIS — K760 Fatty (change of) liver, not elsewhere classified: Secondary | ICD-10-CM

## 2017-11-19 MED ORDER — NADOLOL 80 MG PO TABS: 80.0000 mg | ORAL_TABLET | Freq: Every day | ORAL | 11 refills | Status: DC

## 2017-11-19 NOTE — Progress Notes (Signed)
Primary Care Physician: Lavera Guise, MD  Primary Gastroenterologist:  Dr. Lucilla Lame  Chief Complaint  Patient presents with  . Follow up fatty liver    HPI: Danielle Wallace is a 57 y.o. female here for a history of abnormal liver enzymes and gastric varices.  This patient had been seen previously and a workup for her abnormal liver enzymes showed her to have a elasticity score with fibrosis of F3 and F4 consistent with cirrhosis.  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  The patient's imaging in the past as also shown the same findings.  The patient had a decrease in her liver enzymes on follow-up when she had decrease some of the social alcohol intake she was partaking in.  The patient also had said that she was going to join [employer] and loose some weight and was instructed to follow up in 3 months for repeat liver enzymes. The liver enzymes checked in January of this year did not show any change from the previous levels.  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  The patient most recent labs showed her to have fast improvement in her liver enzymes.  Current Outpatient Medications  Medication Sig Dispense Refill  . acyclovir (ZOVIRAX) 400 MG tablet Take 1 tablet po BID for 10 days then take 1 tablet daily 40 tablet 3  . albuterol (PROVENTIL) (2.5 MG/3ML) 0.083% nebulizer solution Take 3 mLs (2.5 mg total) by nebulization every 6 (six) hours as needed for wheezing or shortness of breath. 360 mL 3  . Calcium Carb-Cholecalciferol (CALCIUM + D3) 600-200 MG-UNIT TABS Take 1 tablet by mouth 2 (two) times daily. 180 tablet 3  . canagliflozin (INVOKANA) 300 MG TABS tablet TAKE 1 TABLET BY MOUTH EVERY DAY    . diphenhydrAMINE (BENADRYL) 25 MG tablet Take 25 mg by mouth every 6 (six) hours as needed.    . etodolac (LODINE) 400 MG tablet TAKE 1  TABLET BY MOUTH 3 TIMES A DAY AS NEEDED FOR PAIN    . fluticasone (FLONASE) 50 MCG/ACT nasal spray Place into both nostrils daily.    Marland Kitchen glipiZIDE (GLUCOTROL XL) 5 MG 24 hr tablet Take 2.5 mg by mouth daily with supper.    Marland Kitchen imipramine (TOFRANIL) 25 MG tablet Take 25 mg by mouth at bedtime.    Marland Kitchen levocetirizine (XYZAL) 5 MG tablet Take 5 mg by mouth every evening.    . linaclotide (LINZESS) 145 MCG CAPS capsule Take 145 mcg by mouth daily before breakfast.    . metFORMIN (GLUCOPHAGE) 500 MG tablet Take 2,000 mg by mouth every evening.     . montelukast (SINGULAIR) 10 MG tablet Take 1 tablet (10 mg total) by mouth daily. 30 tablet 3  . nadolol (CORGARD) 40 MG tablet Take 1 tablet (40 mg total) by mouth daily. 30 tablet 11  . omeprazole (PRILOSEC) 40 MG capsule Take 1 capsule (40 mg total) by mouth daily. 30 capsule 3  . oxybutynin (DITROPAN-XL) 5 MG 24 hr tablet Take 5 mg by mouth daily.  11  . PROAIR HFA 108 (90 Base) MCG/ACT inhaler 2 puffs 2 (two) times daily as needed.    . tamoxifen (NOLVADEX) 20 MG tablet TAKE 1 TABLET BY MOUTH EVERY DAY 90 tablet 0  . valsartan-hydrochlorothiazide (DIOVAN-HCT) 80-12.5 MG per tablet Take 1  tablet by mouth daily.     No current facility-administered medications for this visit.     Allergies as of 11/19/2017 - Review Complete 10/27/2017  Allergen Reaction Noted  . Exemestane Anaphylaxis 06/18/2015  . Floxin [ofloxacin] Anaphylaxis 10/12/2013  . Gluten meal Anaphylaxis 01/13/2017  . Levofloxacin Anaphylaxis, Diarrhea, Itching, Nausea Only, Other (See Comments), Palpitations, Shortness Of Breath, Swelling, and Tinitus   . Linezolid Anaphylaxis 09/25/2014  . Taxotere [docetaxel] Anaphylaxis 09/25/2014  . Hydrocodone Itching 10/12/2013  . Sulfa antibiotics Other (See Comments) 12/28/2012  . Codeine Rash 08/09/2014  . Penicillins Rash 10/12/2013  . Vancomycin Rash 08/16/2014    ROS:  General: Negative for anorexia, weight loss, fever, chills, fatigue,  weakness. ENT: Negative for hoarseness, difficulty swallowing , nasal congestion. CV: Negative for chest pain, angina, palpitations, dyspnea on exertion, peripheral edema.  Respiratory: Negative for dyspnea at rest, dyspnea on exertion, cough, sputum, wheezing.  GI: See history of present illness. GU:  Negative for dysuria, hematuria, urinary incontinence, urinary frequency, nocturnal urination.  Endo: Negative for unusual weight change.    Physical Examination:   BP 123/67   Pulse 78   Ht 5' 6"  (1.676 m)   Wt 209 lb (94.8 kg)   BMI 33.73 kg/m   General: Well-nourished, well-developed in no acute distress.  Eyes: No icterus. Conjunctivae pink. Mouth: Oropharyngeal mucosa moist and pink , no lesions erythema or exudate. Lungs: Clear to auscultation bilaterally. Non-labored. Heart: Regular rate and rhythm, no murmurs rubs or gallops.  Abdomen: Bowel sounds are normal, nontender, nondistended, no hepatosplenomegaly or masses, no abdominal bruits or hernia , no rebound or guarding.   Extremities: No lower extremity edema. No clubbing or deformities. Neuro: Alert and oriented x 3.  Grossly intact. Skin: Warm and dry, no jaundice.   Psych: Alert and cooperative, normal mood and affect.  Labs:    Imaging Studies: No results found.  Assessment and Plan:   Danielle Wallace is a 57 y.o. y/o female who has a history of abnormal liver enzymes in the past and fibrosis with cirrhosis.  The patient had recent lab work that showed only her ALT to be slightly elevated and much improved from her past labs.  The patient has been told to continue doing what she is doing in addition to losing some weight. The patient's blood pressure and pulse have remained stable on her Nadolol and it will be increased to 80 mg a day.  She'll also be set up for a right upper quadrant ultrasound and alpha-fetoprotein.  The patient will follow up in 6 months for [facility] surveillance.    Lucilla Lame, MD. Marval Regal   Note:  This dictation was prepared with Dragon dictation along with smaller phrase technology. Any transcriptional errors that result from this process are unintentional.

## 2017-11-19 NOTE — Patient Instructions (Signed)
You are scheduled for a RUQ abdominal US at [facility] on Wednesday, July 17th at 8:30am. Please arrive at the medical mall registration desk at 8:15am. You cannot have anything to eat or drink after midnight on Tuesday night.   If you need to reschedule this appointment for any reason, please contact central scheduling at [phone].

## 2017-11-20 LAB — AFP TUMOR MARKER: AFP, Serum, Tumor Marker: 5.4 ng/mL (ref 0.0–8.3)

## 2017-11-23 ENCOUNTER — Other Ambulatory Visit: Payer: Self-pay | Admitting: Internal Medicine

## 2017-11-24 DIAGNOSIS — H524 Presbyopia: Secondary | ICD-10-CM | POA: Diagnosis not present

## 2017-11-24 DIAGNOSIS — E119 Type 2 diabetes mellitus without complications: Secondary | ICD-10-CM | POA: Diagnosis not present

## 2017-11-24 DIAGNOSIS — H25042 Posterior subcapsular polar age-related cataract, left eye: Secondary | ICD-10-CM | POA: Diagnosis not present

## 2017-11-24 DIAGNOSIS — E089 Diabetes mellitus due to underlying condition without complications: Secondary | ICD-10-CM | POA: Diagnosis not present

## 2017-11-25 ENCOUNTER — Other Ambulatory Visit: Payer: Self-pay | Admitting: *Deleted

## 2017-11-25 ENCOUNTER — Telehealth: Payer: Self-pay

## 2017-11-25 ENCOUNTER — Ambulatory Visit
Admission: RE | Admit: 2017-11-25 | Discharge: 2017-11-25 | Disposition: A | Discharge status: Home / Self Care | Payer: BLUE CROSS/BLUE SHIELD | Source: Ambulatory Visit | Attending: Gastroenterology | Admitting: Gastroenterology

## 2017-11-25 DIAGNOSIS — C50412 Malignant neoplasm of upper-outer quadrant of left female breast: Secondary | ICD-10-CM

## 2017-11-25 DIAGNOSIS — K760 Fatty (change of) liver, not elsewhere classified: Secondary | ICD-10-CM | POA: Diagnosis not present

## 2017-11-25 DIAGNOSIS — Z170 Estrogen receptor positive status [ER+]: Principal | ICD-10-CM

## 2017-11-25 NOTE — Telephone Encounter (Signed)
-----   Message from Lucilla Lame, MD sent at 11/25/2017  4:49 PM EDT ----- That the patient know that the ultrasound did not show any abnormal growths or masses.

## 2017-11-25 NOTE — Telephone Encounter (Signed)
-----   Message from Lucilla Lame, MD sent at 11/21/2017  8:12 AM EDT ----- That the patient know that her tumor marker for liver cancer was negative.

## 2017-11-25 NOTE — Telephone Encounter (Signed)
Pt notified of [place] and lab results.

## 2017-11-26 ENCOUNTER — Encounter: Payer: Self-pay | Admitting: Internal Medicine

## 2017-11-26 ENCOUNTER — Other Ambulatory Visit: Payer: Self-pay

## 2017-11-26 ENCOUNTER — Inpatient Hospital Stay (HOSPITAL_BASED_OUTPATIENT_CLINIC_OR_DEPARTMENT_OTHER): Payer: BLUE CROSS/BLUE SHIELD | Admitting: Internal Medicine

## 2017-11-26 ENCOUNTER — Inpatient Hospital Stay: Payer: BLUE CROSS/BLUE SHIELD | Attending: Internal Medicine

## 2017-11-26 VITALS — BP 115/78 | HR 75 | Temp 97.8°F | Resp 20 | Ht 66.0 in | Wt 206.0 lb

## 2017-11-26 DIAGNOSIS — Z7984 Long term (current) use of oral hypoglycemic drugs: Secondary | ICD-10-CM | POA: Diagnosis not present

## 2017-11-26 DIAGNOSIS — R232 Flushing: Secondary | ICD-10-CM | POA: Insufficient documentation

## 2017-11-26 DIAGNOSIS — Z803 Family history of malignant neoplasm of breast: Secondary | ICD-10-CM

## 2017-11-26 DIAGNOSIS — Z170 Estrogen receptor positive status [ER+]: Secondary | ICD-10-CM

## 2017-11-26 DIAGNOSIS — Z8614 Personal history of Methicillin resistant Staphylococcus aureus infection: Secondary | ICD-10-CM

## 2017-11-26 DIAGNOSIS — Z9221 Personal history of antineoplastic chemotherapy: Secondary | ICD-10-CM | POA: Insufficient documentation

## 2017-11-26 DIAGNOSIS — I864 Gastric varices: Secondary | ICD-10-CM

## 2017-11-26 DIAGNOSIS — Z79810 Long term (current) use of selective estrogen receptor modulators (SERMs): Secondary | ICD-10-CM | POA: Diagnosis not present

## 2017-11-26 DIAGNOSIS — K7460 Unspecified cirrhosis of liver: Secondary | ICD-10-CM | POA: Diagnosis not present

## 2017-11-26 DIAGNOSIS — E119 Type 2 diabetes mellitus without complications: Secondary | ICD-10-CM

## 2017-11-26 DIAGNOSIS — Z79899 Other long term (current) drug therapy: Secondary | ICD-10-CM | POA: Diagnosis not present

## 2017-11-26 DIAGNOSIS — Z85528 Personal history of other malignant neoplasm of kidney: Secondary | ICD-10-CM | POA: Insufficient documentation

## 2017-11-26 DIAGNOSIS — C50412 Malignant neoplasm of upper-outer quadrant of left female breast: Secondary | ICD-10-CM

## 2017-11-26 DIAGNOSIS — Z9012 Acquired absence of left breast and nipple: Secondary | ICD-10-CM

## 2017-11-26 LAB — CBC WITH DIFFERENTIAL/PLATELET
BASOS PCT: 1 %
Basophils Absolute: 0 10*3/uL (ref 0–0.1)
EOS ABS: 0.2 10*3/uL (ref 0–0.7)
EOS PCT: 3 %
HCT: 44.1 % (ref 35.0–47.0)
HEMOGLOBIN: 14.7 g/dL (ref 12.0–16.0)
LYMPHS ABS: 2.2 10*3/uL (ref 1.0–3.6)
Lymphocytes Relative: 36 %
MCH: 30.8 pg (ref 26.0–34.0)
MCHC: 33.4 g/dL (ref 32.0–36.0)
MCV: 92.2 fL (ref 80.0–100.0)
MONOS PCT: 10 %
Monocytes Absolute: 0.6 10*3/uL (ref 0.2–0.9)
NEUTROS PCT: 50 %
Neutro Abs: 3.1 10*3/uL (ref 1.4–6.5)
Platelets: 212 10*3/uL (ref 150–440)
RBC: 4.78 MIL/uL (ref 3.80–5.20)
RDW: 13.6 % (ref 11.5–14.5)
WBC: 6.2 10*3/uL (ref 3.6–11.0)

## 2017-11-26 LAB — COMPREHENSIVE METABOLIC PANEL
ALK PHOS: 88 U/L (ref 38–126)
ALT: 64 U/L — ABNORMAL HIGH (ref 0–44)
ANION GAP: 10 (ref 5–15)
AST: 54 U/L — ABNORMAL HIGH (ref 15–41)
Albumin: 3.9 g/dL (ref 3.5–5.0)
BILIRUBIN TOTAL: 0.8 mg/dL (ref 0.3–1.2)
BUN: 21 mg/dL — ABNORMAL HIGH (ref 6–20)
CO2: 28 mmol/L (ref 22–32)
Calcium: 9.5 mg/dL (ref 8.9–10.3)
Chloride: 101 mmol/L (ref 98–111)
Creatinine, Ser: 0.65 mg/dL (ref 0.44–1.00)
GFR calc non Af Amer: >60 mL/min (ref >60)
Glucose, Bld: 159 mg/dL — ABNORMAL HIGH (ref 70–99)
Potassium: 4.6 mmol/L (ref 3.5–5.1)
Sodium: 139 mmol/L (ref 135–145)
TOTAL PROTEIN: 7 g/dL (ref 6.5–8.1)

## 2017-11-26 MED ORDER — TAMOXIFEN CITRATE 20 MG PO TABS: 20.0000 mg | ORAL_TABLET | Freq: Every day | ORAL | 3 refills | Status: DC

## 2017-11-26 NOTE — Progress Notes (Signed)
[name] OFFICE PROGRESS NOTE  Patient Care Team: Lavera Guise, MD as PCP - General (Internal Medicine) Bary Castilla, Forest Gleason, MD (General Surgery) Dion Body, MD as Referring Physician (Family Medicine) Forest Gleason, MD (Inactive) (Oncology)  Cancer Staging No matching staging information was found for the patient.    Oncology History   # July 2016- 1.carcinoma of breast(left) T1 C. N0 M0 status post ultrasound-guided biopsy Estrogen receptor positive. Progesterone receptor positive.  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 chemotherapy on hold since November because of cellulitis in the chest wall. 4.started on Herceptin May 22, 2014.  Cause of recurrent chest wall  infection chemotherapy was put on hold. 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  Patient is on multiple antibiotic and as per infectious disease specialist similar to stay on antibiotic for 4 months. So chemotherapy has been discontinued. Maintenance Herceptin as well as patient was started on letrozole from June 12, 2014 6.  Port was removed because of Pseudomonas infection of the pocket (August, 2016) 7.  Patient is finishing up Herceptin in August of 2016 now on letrozole;   # FEB 2017- TAMOXIFEN    # Jan 2019- BCI- intermediate risk- recommend EXTENDED TAM  # Cirrhosis/ ? Etiology; gastric varices [Dr.Wohl]  # Hemochromatosis FHx ---------------------------------------------------------------   DIAGNOSIS: BREAST CA; Triple positive  STAGE:  I       ;GOALS: curative  CURRENT/MOST RECENT THERAPY: Tamoxifen      Carcinoma of upper-outer quadrant of left breast in female,  estrogen receptor positive ([place])        INTERVAL HISTORY:  Danielle Wallace 57 y.o.  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  Continues to have intermittent hot flashes.  Otherwise denies any lumps or bumps.  Appetite is good.  No weight loss.  Review of Systems  Constitutional: Negative for chills, diaphoresis, fever, malaise/fatigue and weight loss.  HENT: Negative for nosebleeds and sore throat.   Eyes: Negative for double vision.  Respiratory: Negative for cough, hemoptysis, sputum production, shortness of breath and wheezing.   Cardiovascular: Negative for chest pain, palpitations, orthopnea and leg swelling.  Gastrointestinal: Negative for abdominal pain, blood in stool, constipation, diarrhea, heartburn, melena, nausea and vomiting.  Genitourinary: Negative for dysuria, frequency and urgency.  Musculoskeletal: Negative for back pain and joint pain.  Skin: Negative.  Negative for itching and rash.  Neurological: Negative for dizziness, tingling, focal weakness, weakness and headaches.  Endo/Heme/Allergies: Does not bruise/bleed easily.  Psychiatric/Behavioral: Negative for depression. The patient is not nervous/anxious and does not have insomnia.      PAST MEDICAL HISTORY :  Past Medical History:  Diagnosis Date  . Arthritis 2006  . Asthma   . Breast cancer ([place]) 10/14/2013   18 mm, T1c, N0; ER/ PR positive,her 2 neu overexpressed. Adjuvant chemo/ herceptin.  . Cancer [facility]) 2006   Renal cell carcinoma; cryosurgery treatment, right side  . Cirrhosis ([place])   . Collapsed lung 2007  . Diabetes mellitus without complication ([place]) 0102   Metformin  . Diffuse cystic mastopathy   . Motion sickness    any moving vehicle  .  Orthodontics    braces  . Personal history of chemotherapy   . Sinus problem     PAST SURGICAL HISTORY :   Past Surgical History:  Procedure  Laterality Date  . BREAST BIOPSY Right 2005   negative  . BREAST BIOPSY Left 08/23/2007   negative  . BREAST BIOPSY Left 10/24/2013   positive  . BREAST BIOPSY Left 07/2002   neg  . BREAST BIOPSY Left 08/23/2007   neg  . BREAST SURGERY Left 10/2011   Cyst Aspirationapocrine metaplasia, ductal cells and bone cells, hypo-cellular  . BREAST SURGERY Left 2009   ADH on stereotactic biopsy, 1.5 mm focus.  Marland Kitchen BREAST SURGERY Left 2003   fibrocystic changes with ductal hyperplasia without atypia.  Marland Kitchen BREAST SURGERY Left    mastectomy  . BREAST SURGERY Left April 11, 2014   Removal of implant, debridement chest wall Dr.Coan  . CESAREAN SECTION  1991  . CHOLECYSTECTOMY  1990  . COLONOSCOPY  08/26/2013   Verdie Shire, M.D. normal.  . CRYOABLATION  2005, 2010  . CYST REMOVAL NECK  10/2011   Dr. Tami Ribas  . ESOPHAGOGASTRODUODENOSCOPY (EGD) WITH PROPOFOL N/A 01/16/2017   Procedure: ESOPHAGOGASTRODUODENOSCOPY (EGD) WITH PROPOFOL;  Surgeon: Lucilla Lame, MD;  Location: [place];  Service: Gastroenterology;  Laterality: N/A;  Diabetic - oral meds  . INCISION AND DRAINAGE Left Dec 2015, Feb 2016   Dr Tula Nakayama  . MASTECTOMY Left 11/24/2013   positive  . PORTACATH PLACEMENT  11/24/13    FAMILY HISTORY :   Family History  Problem Relation Age of Onset  . Liver cancer Father   . Hemochromatosis Father   . Liver disease Father   . Diabetes Mother   . Hypertension Mother   . Breast cancer Paternal Aunt 69  . Ovarian cancer Maternal Grandmother   . Diabetes Sister   . Hemachromatosis Daughter     SOCIAL HISTORY:   Social History   Tobacco Use  . Smoking status: Never Smoker  . Smokeless tobacco: Never Used  Substance Use Topics  . Alcohol use: Not Currently    Comment: occasionally, none recently  . Drug use: No    ALLERGIES:  is allergic to exemestane; floxin [ofloxacin]; gluten meal; levofloxacin; linezolid; taxotere [docetaxel]; hydrocodone; sulfa antibiotics; codeine; penicillins; and  vancomycin.  MEDICATIONS:  Current Outpatient Medications  Medication Sig Dispense Refill  . albuterol (PROVENTIL) (2.5 MG/3ML) 0.083% nebulizer solution Take 3 mLs (2.5 mg total) by nebulization every 6 (six) hours as needed for wheezing or shortness of breath. 360 mL 3  . Calcium Carb-Cholecalciferol (CALCIUM + D3) 600-200 MG-UNIT TABS Take 1 tablet by mouth 2 (two) times daily. 180 tablet 3  . canagliflozin (INVOKANA) 300 MG TABS tablet TAKE 1 TABLET BY MOUTH EVERY DAY    . diphenhydrAMINE (BENADRYL) 25 MG tablet Take 25 mg by mouth every 6 (six) hours as needed.    . etodolac (LODINE) 400 MG tablet TAKE 1 TABLET BY MOUTH 3 TIMES A DAY AS NEEDED FOR PAIN    . fluticasone (FLONASE) 50 MCG/ACT nasal spray Place into both nostrils daily.    Marland Kitchen glipiZIDE (GLUCOTROL XL) 5 MG 24 hr tablet Take 2.5 mg by mouth daily with supper.    Marland Kitchen imipramine (TOFRANIL) 25 MG tablet Take 25 mg by mouth at bedtime.    Marland Kitchen levocetirizine (XYZAL) 5 MG tablet Take 5 mg by mouth every evening.    . linaclotide (LINZESS) 145 MCG CAPS capsule Take 145 mcg by mouth daily before  breakfast.    . metFORMIN (GLUCOPHAGE) 500 MG tablet Take 2,000 mg by mouth every evening.     . montelukast (SINGULAIR) 10 MG tablet Take 1 tablet (10 mg total) by mouth daily. 30 tablet 3  . nadolol (CORGARD) 80 MG tablet Take 1 tablet (80 mg total) by mouth daily. 30 tablet 11  . omeprazole (PRILOSEC) 40 MG capsule Take 1 capsule (40 mg total) by mouth daily. 30 capsule 3  . PROAIR HFA 108 (90 Base) MCG/ACT inhaler 2 puffs 2 (two) times daily as needed.    . tamoxifen (NOLVADEX) 20 MG tablet Take 1 tablet (20 mg total) by mouth daily. 90 tablet 3  . valsartan-hydrochlorothiazide (DIOVAN-HCT) 80-12.5 MG tablet TAKE 1 TABLET BY MOUTH EVERY DAY 90 tablet 2  . oxybutynin (DITROPAN-XL) 5 MG 24 hr tablet Take 5 mg by mouth daily.  11   No current facility-administered medications for this visit.     PHYSICAL EXAMINATION: ECOG PERFORMANCE STATUS: 0  - Asymptomatic  BP 115/78 (BP Location: Right Arm, Patient Position: Sitting)   Pulse 75   Temp 97.8 F (36.6 C) (Tympanic)   Resp 20   Ht 5' 6"  (1.676 m)   Wt 206 lb (93.4 kg)   BMI 33.25 kg/m   Filed Weights   11/26/17 1039  Weight: 206 lb (93.4 kg)    GENERAL: Well-nourished well-developed; Alert, no distress and comfortable.   Alone.  EYES: no pallor or icterus OROPHARYNX: no thrush or ulceration; good dentition  NECK: supple, no masses felt LYMPH:  no palpable lymphadenopathy in the cervical, axillary or inguinal regions LUNGS: clear to auscultation and  No wheeze or crackles HEART/CVS: regular rate & rhythm and no murmurs; +1 bil lower extremity edema ABDOMEN:abdomen soft, non-tender and normal bowel sounds; Question hepatomegaly. Mild tend epigastric region. Musculoskeletal:no cyanosis of digits and no clubbing  PSYCH: alert & oriented x 3 with fluent speech NEURO: no focal motor/sensory deficits Right and left BREAST exam [in the presence of nurse]-  Right breastno unusual skin changes or dominant masses felt.  Left mastectomy- Surgical scars noted; no lumps or bumps noted      LABORATORY DATA:  I have reviewed the data as listed    Component Value Date/Time   NA 139 11/26/2017 1007   NA 138 09/04/2014 1358   K 4.6 11/26/2017 1007   K 4.0 09/04/2014 1358   CL 101 11/26/2017 1007   CL 99 (L) 09/04/2014 1358   CO2 28 11/26/2017 1007   CO2 31 09/04/2014 1358   GLUCOSE 159 (H) 11/26/2017 1007   GLUCOSE 136 (H) 09/04/2014 1358   BUN 21 (H) 11/26/2017 1007   BUN 18 09/04/2014 1358   CREATININE 0.65 11/26/2017 1007   CREATININE 0.53 09/04/2014 1358   CALCIUM 9.5 11/26/2017 1007   CALCIUM 9.7 09/04/2014 1358   PROT 7.0 11/26/2017 1007   PROT 7.0 12/15/2016 1438   PROT 7.8 09/04/2014 1358   ALBUMIN 3.9 11/26/2017 1007   ALBUMIN 4.4 12/15/2016 1438   ALBUMIN 4.1 09/04/2014 1358   AST 54 (H) 11/26/2017 1007   AST 32 09/04/2014 1358   ALT 64 (H) 11/26/2017  1007   ALT 33 09/04/2014 1358   ALKPHOS 88 11/26/2017 1007   ALKPHOS 73 09/04/2014 1358   BILITOT 0.8 11/26/2017 1007   BILITOT 0.5 12/15/2016 1438   BILITOT 0.6 09/04/2014 1358   GFRNONAA >60 11/26/2017 1007   GFRNONAA >60 09/04/2014 1358   GFRAA >60 11/26/2017 1007   GFRAA >60  09/04/2014 1358    No results found for: SPEP, UPEP  Lab Results  Component Value Date   WBC 6.2 11/26/2017   NEUTROABS 3.1 11/26/2017   HGB 14.7 11/26/2017   HCT 44.1 11/26/2017   MCV 92.2 11/26/2017   PLT 212 11/26/2017      Chemistry      Component Value Date/Time   NA 139 11/26/2017 1007   NA 138 09/04/2014 1358   K 4.6 11/26/2017 1007   K 4.0 09/04/2014 1358   CL 101 11/26/2017 1007   CL 99 (L) 09/04/2014 1358   CO2 28 11/26/2017 1007   CO2 31 09/04/2014 1358   BUN 21 (H) 11/26/2017 1007   BUN 18 09/04/2014 1358   CREATININE 0.65 11/26/2017 1007   CREATININE 0.53 09/04/2014 1358      Component Value Date/Time   CALCIUM 9.5 11/26/2017 1007   CALCIUM 9.7 09/04/2014 1358   ALKPHOS 88 11/26/2017 1007   ALKPHOS 73 09/04/2014 1358   AST 54 (H) 11/26/2017 1007   AST 32 09/04/2014 1358   ALT 64 (H) 11/26/2017 1007   ALT 33 09/04/2014 1358   BILITOT 0.8 11/26/2017 1007   BILITOT 0.5 12/15/2016 1438   BILITOT 0.6 09/04/2014 1358       RADIOGRAPHIC STUDIES: I have personally reviewed the radiological images as listed and agreed with the findings in the report. No results found.   ASSESSMENT & PLAN:  Carcinoma of upper-outer quadrant of left breast in female, estrogen receptor positive ([place]) Stage I Breast cancer ER/PR/her 2 neu POS-  currently on tamoxifen. Tolerating well.   # Clinically no evidence of recurrence. Mammogram in June 2019- negative. Continue tamoxifen [BCI- intermediate risk; recommend extended therapy].  Stable.  # Elevated LFTs~? Cirrhossi/NASH; gastric varices s/p egd- sep 2018- Dr.Wohl.  Stable  # hot flashes/ vasomotor symptoms- off effexor; not any worse.  STABLE.   # BMD- Normal Feb 2017; continue calcium and vitamin D.  Stable  # follow up in 6 months/labs prior/cbc/cmp     Orders Placed This Encounter  Procedures  . CBC with Differential    Standing Status:   Future    Standing Expiration Date:   11/27/2018  . Comprehensive metabolic panel    Standing Status:   Future    Standing Expiration Date:   11/27/2018   All questions were answered. The patient knows to call the clinic with any problems, questions or concerns.      Cammie Sickle, MD 11/30/2017 5:41 PM

## 2017-11-26 NOTE — Progress Notes (Signed)
RN Chaperoned provider with Breast Exam.   

## 2017-11-26 NOTE — Assessment & Plan Note (Addendum)
Stage I Breast cancer ER/PR/her 2 neu POS-  currently on tamoxifen. Tolerating well.   # Clinically no evidence of recurrence. Mammogram in June 2019- negative. Continue tamoxifen [BCI- intermediate risk; recommend extended therapy].  Stable.  # Elevated LFTs~? Cirrhossi/NASH; gastric varices s/p egd- sep 2018- Dr.Wohl.  Stable  # hot flashes/ vasomotor symptoms- off effexor; not any worse. STABLE.   # BMD- Normal Feb 2017; continue calcium and vitamin D.  Stable  # follow up in 6 months/labs prior/cbc/cmp

## 2017-12-15 ENCOUNTER — Ambulatory Visit: Payer: Self-pay | Admitting: Nurse Practitioner

## 2017-12-24 ENCOUNTER — Other Ambulatory Visit: Payer: Self-pay | Admitting: Internal Medicine

## 2017-12-24 DIAGNOSIS — C50412 Malignant neoplasm of upper-outer quadrant of left female breast: Secondary | ICD-10-CM

## 2018-01-05 IMAGING — US US LIVER ELASTOGRAPHY
1 series · 13 of 25 positions shown · non-contrast
Comparison: Ultrasound 01/25/2016

CLINICAL DATA: Elevated LFTs

EXAM:
US ABDOMEN LIMITED - RIGHT UPPER QUADRANT
ULTRASOUND HEPATIC ELASTOGRAPHY
TECHNIQUE: Limited right upper quadrant abdominal ultrasound was performed. In
addition, ultrasound elastography evaluation of the liver was
performed. A region of interest was placed in the right lobe of the
liver. Following application of a compressive sonographic pulse,
shear waves were detected in the adjacent hepatic tissue and the
shear wave velocity was calculated. Multiple assessments were
performed at the selected site. Median shear wave velocity is
correlated to a Metavir fibrosis score.

[Series 1: us liver elastography · 0.22mm/px · 13 of 64 slices shown]
[im 1/64]
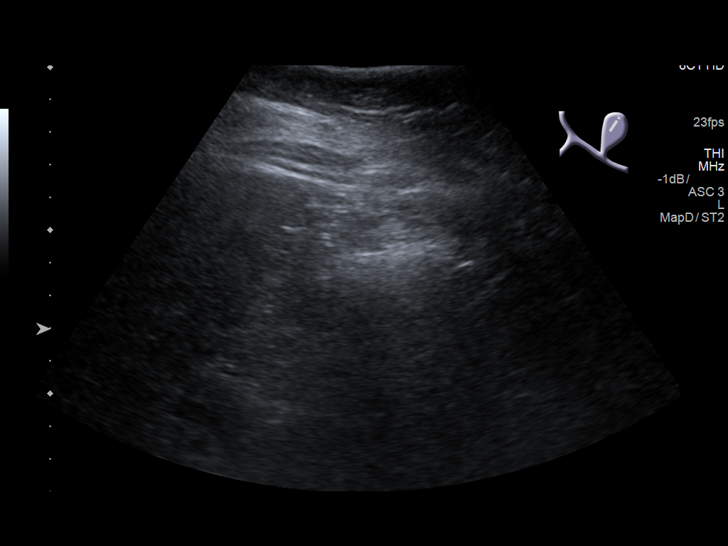
[im 6/64]
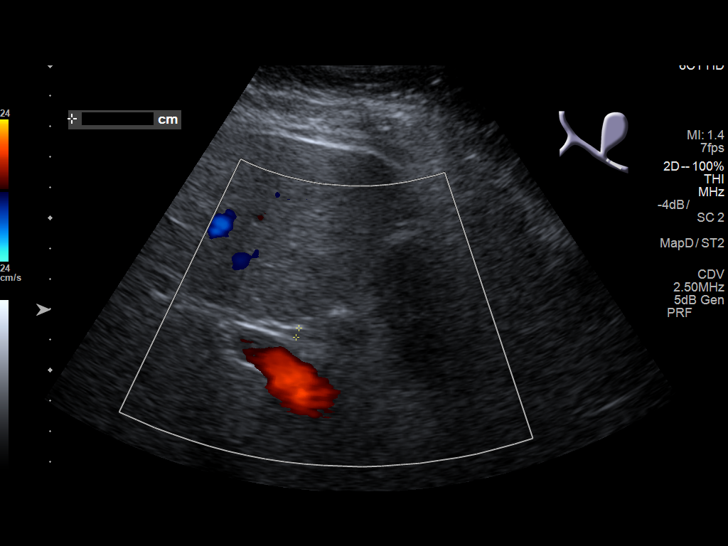
[im 11/64]
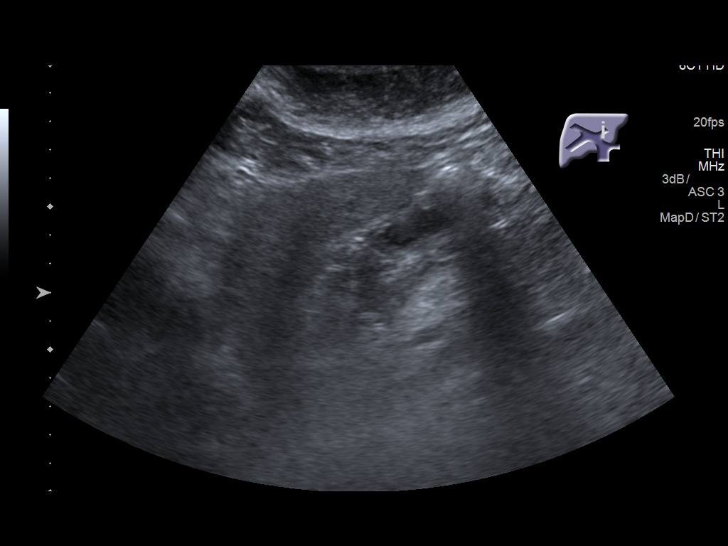
[im 16/64]
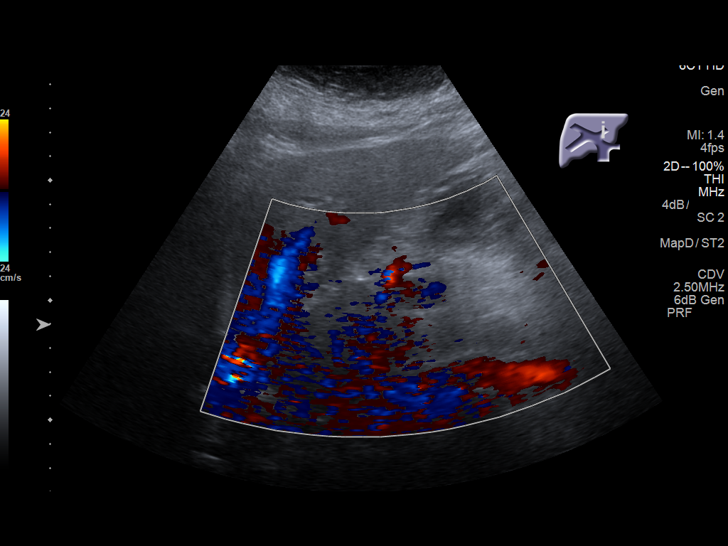
[im 22/64]
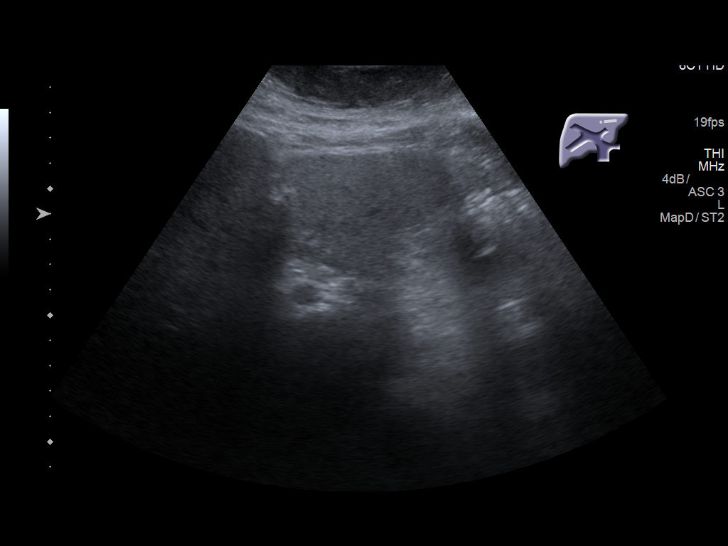
[im 27/64]
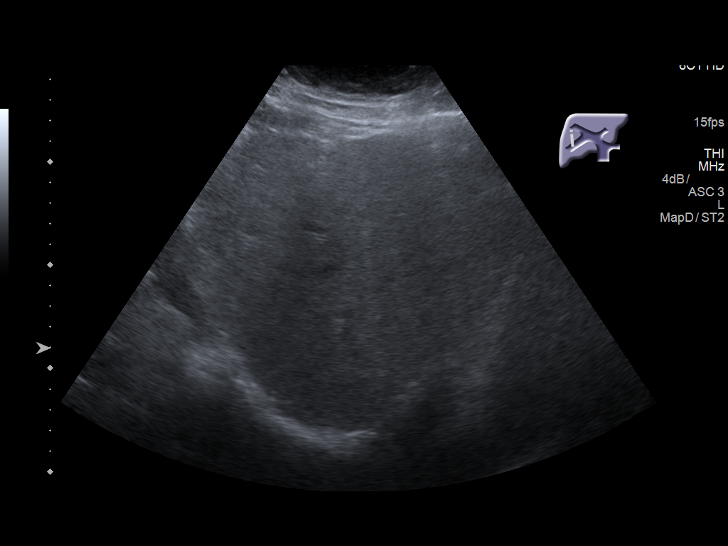
[im 32/64]
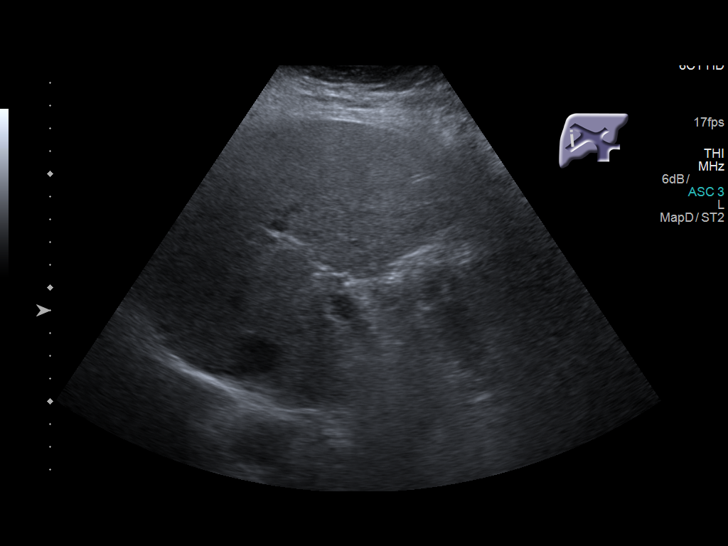
[im 37/64]
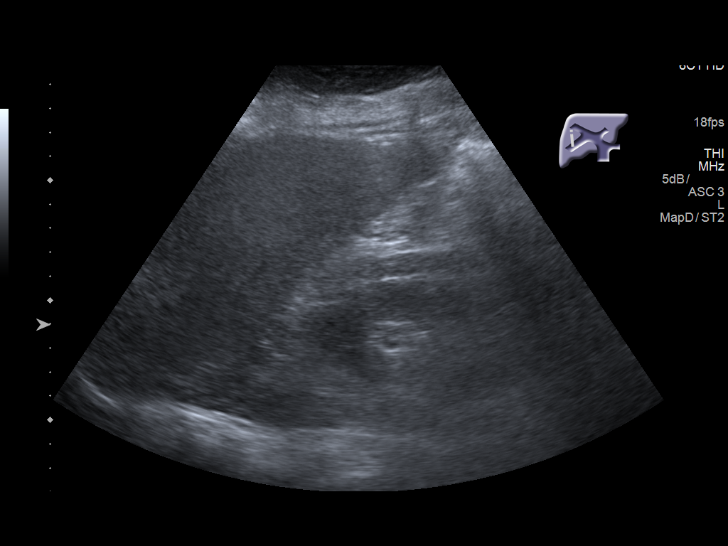
[im 43/64]
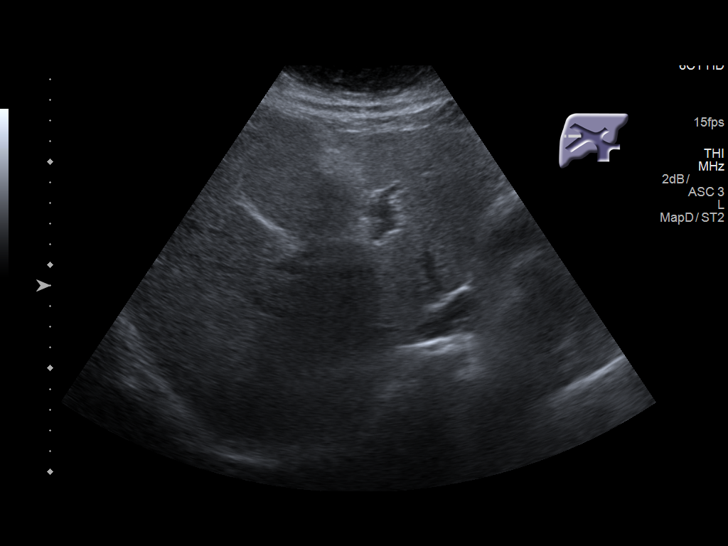
[im 48/64]
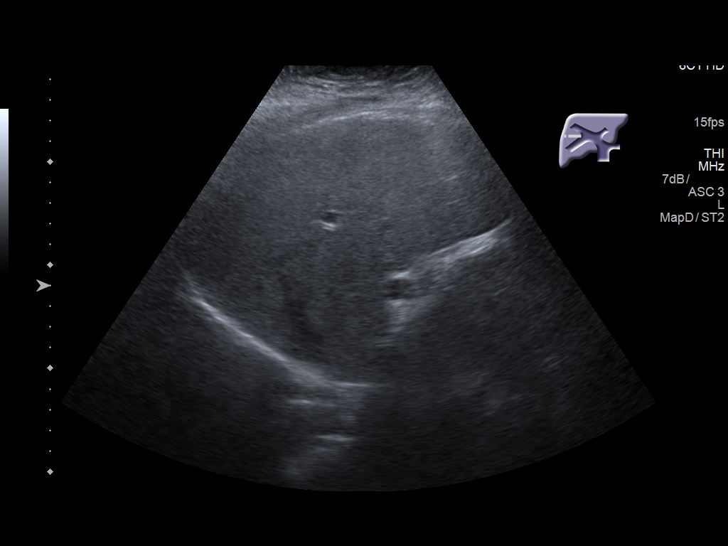
[im 53/64]
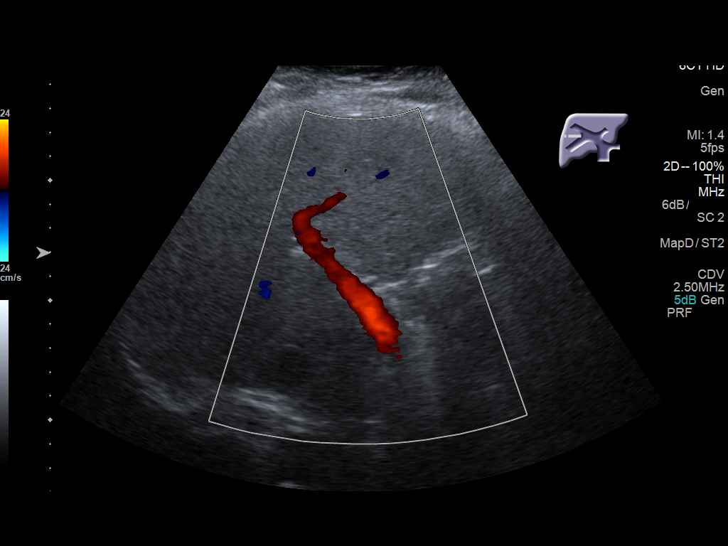
[im 58/64]
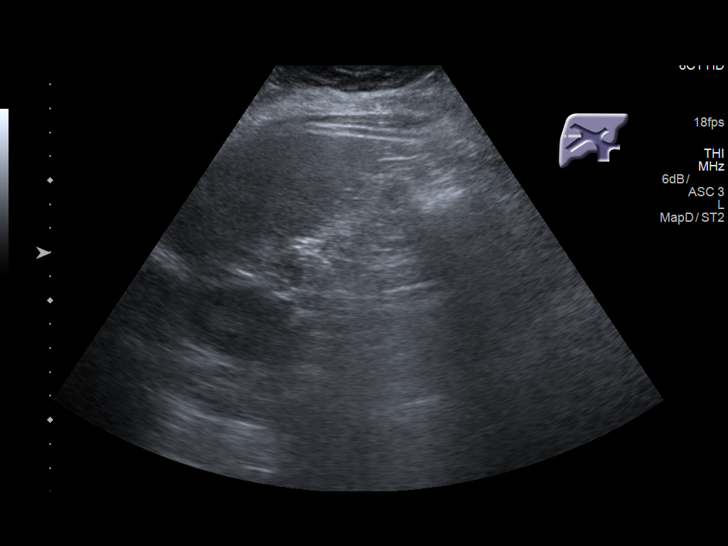
[im 64/64]
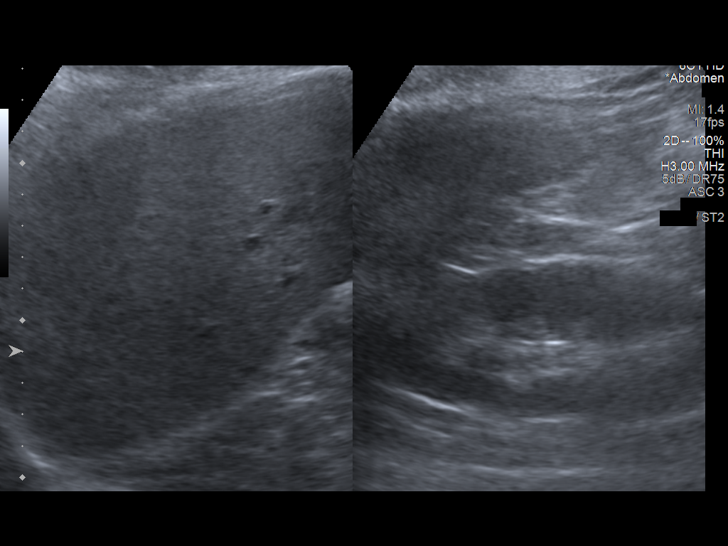

[13 of 25 positions shown; findings below may reference images not displayed]

FINDINGS: ULTRASOUND ABDOMEN LIMITED RIGHT UPPER QUADRANT

Gallbladder:

Prior cholecystectomy

Common bile duct:

Diameter: Normal caliber, 3 mm

Liver:

Increased echotexture compatible with fatty infiltration. No focal
abnormality or biliary ductal dilatation.

ULTRASOUND HEPATIC ELASTOGRAPHY

Device: Siemens Helix VTQ

Patient position: Supine

Transducer 6C1

Number of measurements: 10

Hepatic segment:  8

Median velocity:   3.55  m/sec

IQR:

IQR/Median velocity ratio:

Corresponding Metavir fibrosis score:  Some F3 + F4

Risk of fibrosis: High

Limitations of exam: None

Pertinent findings noted on other imaging exams:  None

Please note that abnormal shear wave velocities may also be
identified in clinical settings other than with hepatic fibrosis,
such as: acute hepatitis, elevated right heart and central venous
pressures including use of beta blockers, [place] disease
([place]), infiltrative processes such as
mastocytosis/amyloidosis/infiltrative tumor, extrahepatic
cholestasis, in the post-prandial state, and liver transplantation.
Correlation with patient history, laboratory data, and clinical
condition recommended.
IMPRESSION: ULTRASOUND ABDOMEN:
Fatty infiltration of the liver.

ULTRASOUND HEPATIC ELASTOGRAHY:

Median hepatic shear wave velocity is calculated at 3.55 m/sec.

Corresponding Metavir fibrosis score is  Some F3 + F4.

Risk of fibrosis is High.

Follow-up: Follow up advised

## 2018-01-23 ENCOUNTER — Other Ambulatory Visit: Payer: Self-pay | Admitting: Gastroenterology

## 2018-01-29 ENCOUNTER — Other Ambulatory Visit: Payer: Self-pay

## 2018-01-29 MED ORDER — OMEPRAZOLE 40 MG PO CPDR: 40.0000 mg | DELAYED_RELEASE_CAPSULE | Freq: Every day | ORAL | 3 refills | Status: DC

## 2018-01-29 MED ORDER — MONTELUKAST SODIUM 10 MG PO TABS: 10.0000 mg | ORAL_TABLET | Freq: Every day | ORAL | 3 refills | Status: DC

## 2018-03-12 ENCOUNTER — Other Ambulatory Visit: Payer: Self-pay | Admitting: Internal Medicine

## 2018-04-16 ENCOUNTER — Encounter: Payer: Self-pay | Admitting: Nurse Practitioner

## 2018-04-16 ENCOUNTER — Ambulatory Visit: Payer: BLUE CROSS/BLUE SHIELD | Admitting: Nurse Practitioner

## 2018-04-16 VITALS — BP 124/72 | HR 81 | Resp 16 | Ht 65.0 in | Wt 210.6 lb

## 2018-04-16 DIAGNOSIS — E119 Type 2 diabetes mellitus without complications: Secondary | ICD-10-CM | POA: Diagnosis not present

## 2018-04-16 DIAGNOSIS — I10 Essential (primary) hypertension: Secondary | ICD-10-CM

## 2018-04-16 DIAGNOSIS — Z23 Encounter for immunization: Secondary | ICD-10-CM

## 2018-04-16 LAB — POCT GLYCOSYLATED HEMOGLOBIN (HGB A1C): Hemoglobin A1C: 6.4 % — AB (ref 4.0–5.6)

## 2018-04-16 MED ORDER — PNEUMOCOCCAL VAC POLYVALENT 25 MCG/0.5ML IJ INJ: INJECTION | INTRAMUSCULAR | 0 refills | Status: DC

## 2018-04-16 NOTE — Progress Notes (Signed)
[facility] [place], [place] [address]  Internal MEDICINE  Office Visit Note  Patient Name: Danielle Wallace  [id number]  [id number]  Date of Service: 04/21/2018  Chief Complaint  Patient presents with  . Medical Management of Chronic Issues    4 month follow up  . Diabetes  . Numbness    some in right  prodominately in the left thigh, for a couple of months, more noticeable when walking on concrete or hard surfaces and more intense    The patient is here for routine follow up visit. Today, she is c/o tingling and numbness in the upper aspect of her legs and some moderate to severe lower back pain. Felt like rusty nails sticking in her lower back yesterday, when she stood up from seated position. She hs taken prescribed etodolac and used heating pad to reduce pain and this has helped some.  Blood sugars and blood pressure are both well controlled. Patient is due for pneumonia shot.       Current Medication: Outpatient Encounter Medications as of 04/16/2018  Medication Sig Note  . albuterol (PROVENTIL) (2.5 MG/3ML) 0.083% nebulizer solution Take 3 mLs (2.5 mg total) by nebulization every 6 (six) hours as needed for wheezing or shortness of breath.   . Calcium Carb-Cholecalciferol (CALCIUM + D3) 600-200 MG-UNIT TABS Take 1 tablet by mouth 2 (two) times daily.   . canagliflozin (INVOKANA) 300 MG TABS tablet TAKE 1 TABLET BY MOUTH EVERY DAY 11/29/2014: Received from: [facility]  . diphenhydrAMINE (BENADRYL) 25 MG tablet Take 25 mg by mouth every 6 (six) hours as needed.   . fluticasone (FLONASE) 50 MCG/ACT nasal spray Place into both nostrils daily.   Marland Kitchen glipiZIDE (GLUCOTROL XL) 5 MG 24 hr tablet Take 2.5 mg by mouth daily with supper.   Marland Kitchen imipramine (TOFRANIL) 25 MG tablet Take 25 mg by mouth at bedtime.   Marland Kitchen LINZESS 145 MCG CAPS capsule TAKE ONE CAPSULE BY MOUTH EVERY DAY   . metFORMIN (GLUCOPHAGE) 500 MG tablet Take 2,000 mg by mouth every  evening.    . montelukast (SINGULAIR) 10 MG tablet Take 1 tablet (10 mg total) by mouth daily.   . nadolol (CORGARD) 80 MG tablet Take 1 tablet (80 mg total) by mouth daily.   Marland Kitchen omeprazole (PRILOSEC) 40 MG capsule Take 1 capsule (40 mg total) by mouth daily.   Marland Kitchen oxybutynin (DITROPAN-XL) 5 MG 24 hr tablet Take 5 mg by mouth daily. 11/26/2017: Has not started  . PROAIR HFA 108 (90 Base) MCG/ACT inhaler 2 puffs 2 (two) times daily as needed.   . tamoxifen (NOLVADEX) 20 MG tablet Take 1 tablet (20 mg total) by mouth daily.   . valsartan-hydrochlorothiazide (DIOVAN-HCT) 80-12.5 MG tablet TAKE 1 TABLET BY MOUTH EVERY DAY   . [DISCONTINUED] etodolac (LODINE) 400 MG tablet TAKE 1 TABLET BY MOUTH 3 TIMES A DAY AS NEEDED FOR PAIN   . levocetirizine (XYZAL) 5 MG tablet Take 5 mg by mouth every evening.   . pneumococcal 23 valent vaccine (PNEUMOVAX 23) 25 MCG/0.5ML injection Inject 0.11m IM once    No facility-administered encounter medications on file as of 04/16/2018.     Surgical History: Past Surgical History:  Procedure Laterality Date  . BREAST BIOPSY Right 2005   negative  . BREAST BIOPSY Left 08/23/2007   negative  . BREAST BIOPSY Left 10/24/2013   positive  . BREAST BIOPSY Left 07/2002   neg  . BREAST BIOPSY Left 08/23/2007  neg  . BREAST SURGERY Left 10/2011   Cyst Aspirationapocrine metaplasia, ductal cells and bone cells, hypo-cellular  . BREAST SURGERY Left 2009   ADH on stereotactic biopsy, 1.5 mm focus.  Marland Kitchen BREAST SURGERY Left 2003   fibrocystic changes with ductal hyperplasia without atypia.  Marland Kitchen BREAST SURGERY Left    mastectomy  . BREAST SURGERY Left April 11, 2014   Removal of implant, debridement chest wall Dr.Coan  . CESAREAN SECTION  1991  . CHOLECYSTECTOMY  1990  . COLONOSCOPY  08/26/2013   Verdie Shire, M.D. normal.  . CRYOABLATION  2005, 2010  . CYST REMOVAL NECK  10/2011   Dr. Tami Ribas  . ESOPHAGOGASTRODUODENOSCOPY (EGD) WITH PROPOFOL N/A 01/16/2017   Procedure:  ESOPHAGOGASTRODUODENOSCOPY (EGD) WITH PROPOFOL;  Surgeon: Lucilla Lame, MD;  Location: [place];  Service: Gastroenterology;  Laterality: N/A;  Diabetic - oral meds  . INCISION AND DRAINAGE Left Dec 2015, Feb 2016   Dr Tula Nakayama  . MASTECTOMY Left 11/24/2013   positive  . PORTACATH PLACEMENT  11/24/13    Medical History: Past Medical History:  Diagnosis Date  . Arthritis 2006  . Asthma   . Breast cancer ([place]) 10/14/2013   18 mm, T1c, N0; ER/ PR positive,her 2 neu overexpressed. Adjuvant chemo/ herceptin.  . Cancer [facility]) 2006   Renal cell carcinoma; cryosurgery treatment, right side  . Cirrhosis ([place])   . Collapsed lung 2007  . Diabetes mellitus without complication ([place]) 5465   Metformin  . Diffuse cystic mastopathy   . Motion sickness    any moving vehicle  . Orthodontics    braces  . Personal history of chemotherapy   . Sinus problem     Family History: Family History  Problem Relation Age of Onset  . Liver cancer Father   . Hemochromatosis Father   . Liver disease Father   . Diabetes Mother   . Hypertension Mother   . Breast cancer Paternal Aunt 25  . Ovarian cancer Maternal Grandmother   . Diabetes Sister   . Hemachromatosis Daughter     Social History   Socioeconomic History  . Marital status: Married    Spouse name: Not on file  . Number of children: Not on file  . Years of education: Not on file  . Highest education level: Not on file  Occupational History  . Not on file  Social Needs  . Financial resource strain: Not on file  . Food insecurity:    Worry: Not on file    Inability: Not on file  . Transportation needs:    Medical: Not on file    Non-medical: Not on file  Tobacco Use  . Smoking status: Never Smoker  . Smokeless tobacco: Never Used  Substance and Sexual Activity  . Alcohol use: Not Currently    Comment: occasionally, none recently  . Drug use: No  . Sexual activity: Not on file  Lifestyle  . Physical activity:    Days per  week: Not on file    Minutes per session: Not on file  . Stress: Not on file  Relationships  . Social connections:    Talks on phone: Not on file    Gets together: Not on file    Attends religious service: Not on file    Active member of club or organization: Not on file    Attends meetings of clubs or organizations: Not on file    Relationship status: Not on file  . Intimate partner violence:  Fear of current or ex partner: Not on file    Emotionally abused: Not on file    Physically abused: Not on file    Forced sexual activity: Not on file  Other Topics Concern  . Not on file  Social History Narrative  . Not on file      Review of Systems  Constitutional: Negative for activity change, chills, fatigue and fever.  HENT: Negative for congestion, postnasal drip, rhinorrhea, sore throat and voice change.   Eyes: Negative for redness and itching.  Respiratory: Negative for cough, shortness of breath and wheezing.   Cardiovascular: Negative for chest pain, palpitations and leg swelling.  Gastrointestinal: Negative for nausea and vomiting.  Endocrine:       Blood sugars doing well   Musculoskeletal: Positive for myalgias.  Skin: Negative for rash.  Allergic/Immunologic: Positive for environmental allergies.  Neurological: Positive for headaches. Negative for dizziness.  Hematological: Negative for adenopathy.  Psychiatric/Behavioral: Negative for dysphoric mood. The patient is not nervous/anxious.     Today's Vitals   04/16/18 1509  BP: 124/72  Pulse: 81  Resp: 16  SpO2: 93%  Weight: 210 lb 9.6 oz (95.5 kg)  Height: 5' 5"  (1.651 m)    Physical Exam  Constitutional: She is oriented to person, place, and time. She appears well-developed and well-nourished. No distress.  HENT:  Head: Normocephalic and atraumatic.  Nose: Nose normal.  Mouth/Throat: No oropharyngeal exudate.  Eyes: Pupils are equal, round, and reactive to light. EOM are normal.  Neck: Normal range of  motion. Neck supple. No JVD present. No tracheal deviation present. No thyromegaly present.  Cardiovascular: Normal rate, regular rhythm and normal heart sounds. Exam reveals no gallop and no friction rub.  No murmur heard. Pulmonary/Chest: Effort normal and breath sounds normal. No respiratory distress. She has no wheezes. She has no rales. She exhibits no tenderness.  Abdominal: Soft. Bowel sounds are normal. There is no tenderness.  Musculoskeletal: Normal range of motion.  Lymphadenopathy:    She has no cervical adenopathy.  Neurological: She is alert and oriented to person, place, and time. No cranial nerve deficit.  Skin: Skin is warm and dry. She is not diaphoretic.  Psychiatric: She has a normal mood and affect. Her behavior is normal. Judgment and thought content normal.  Nursing note and vitals reviewed.  Assessment/Plan: 1. Diabetes mellitus without complication (HCC) - POCT HgB A1C 6.4 today. Continue diabetic medication as prescribed   2. Essential hypertension Stable. Continue bp medication as prescribed   3. Need for vaccination against Streptococcus pneumoniae using pneumococcal conjugate vaccine 13 Prescription for pneumovax sent to the patient's pharmacy for administration.  - pneumococcal 23 valent vaccine (PNEUMOVAX 23) 25 MCG/0.5ML injection; Inject 0.66m IM once  Dispense: 0.5 mL; Refill: 0  General Counseling: MMellanieverbalizes understanding of the findings of todays visit and agrees with plan of treatment. I have discussed any further diagnostic evaluation that may be needed or ordered today. We also reviewed her medications today. she has been encouraged to call the office with any questions or concerns that should arise related to todays visit.  Diabetes Counseling:  1. Addition of ACE inh/ ARB'S for nephroprotection. Microalbumin is updated  2. Diabetic foot care, prevention of complications. Podiatry consult 3. Exercise and lose weight.  4. Diabetic eye  examination, Diabetic eye exam is updated  5. Monitor blood sugar closlely. nutrition counseling.  6. Sign and symptoms of hypoglycemia including shaking sweating,confusion and headaches.  This patient was seen by  Leretha Pol FNP Collaboration with Dr Lavera Guise as a part of collaborative care agreement  Orders Placed This Encounter  Procedures  . POCT HgB A1C    Meds ordered this encounter  Medications  . pneumococcal 23 valent vaccine (PNEUMOVAX 23) 25 MCG/0.5ML injection    Sig: Inject 0.46m IM once    Dispense:  0.5 mL    Refill:  0    Order Specific Question:   Supervising Provider    Answer:   KLavera Guise[[[id number]   Time spent: 223Minutes      Dr FLavera GuiseInternal medicine

## 2018-04-20 ENCOUNTER — Other Ambulatory Visit: Payer: Self-pay

## 2018-04-20 DIAGNOSIS — C50412 Malignant neoplasm of upper-outer quadrant of left female breast: Secondary | ICD-10-CM

## 2018-04-20 MED ORDER — ETODOLAC 400 MG PO TABS: ORAL_TABLET | ORAL | 2 refills | Status: DC

## 2018-04-21 DIAGNOSIS — Z23 Encounter for immunization: Secondary | ICD-10-CM | POA: Insufficient documentation

## 2018-04-27 ENCOUNTER — Other Ambulatory Visit: Payer: Self-pay

## 2018-04-27 MED ORDER — OMEPRAZOLE 40 MG PO CPDR: 40.0000 mg | DELAYED_RELEASE_CAPSULE | Freq: Every day | ORAL | 3 refills | Status: DC

## 2018-05-27 ENCOUNTER — Other Ambulatory Visit: Payer: Self-pay

## 2018-05-27 ENCOUNTER — Encounter: Payer: Self-pay | Admitting: Internal Medicine

## 2018-05-27 ENCOUNTER — Inpatient Hospital Stay (HOSPITAL_BASED_OUTPATIENT_CLINIC_OR_DEPARTMENT_OTHER): Payer: BLUE CROSS/BLUE SHIELD | Admitting: Internal Medicine

## 2018-05-27 ENCOUNTER — Inpatient Hospital Stay: Payer: BLUE CROSS/BLUE SHIELD | Attending: Internal Medicine

## 2018-05-27 VITALS — BP 113/76 | HR 76 | Temp 97.9°F | Resp 12 | Ht 65.0 in | Wt 210.3 lb

## 2018-05-27 DIAGNOSIS — Z79899 Other long term (current) drug therapy: Secondary | ICD-10-CM

## 2018-05-27 DIAGNOSIS — I864 Gastric varices: Secondary | ICD-10-CM

## 2018-05-27 DIAGNOSIS — R7989 Other specified abnormal findings of blood chemistry: Secondary | ICD-10-CM | POA: Insufficient documentation

## 2018-05-27 DIAGNOSIS — R232 Flushing: Secondary | ICD-10-CM

## 2018-05-27 DIAGNOSIS — E119 Type 2 diabetes mellitus without complications: Secondary | ICD-10-CM | POA: Diagnosis not present

## 2018-05-27 DIAGNOSIS — Z9012 Acquired absence of left breast and nipple: Secondary | ICD-10-CM | POA: Diagnosis not present

## 2018-05-27 DIAGNOSIS — Z8614 Personal history of Methicillin resistant Staphylococcus aureus infection: Secondary | ICD-10-CM | POA: Diagnosis not present

## 2018-05-27 DIAGNOSIS — C50412 Malignant neoplasm of upper-outer quadrant of left female breast: Secondary | ICD-10-CM | POA: Diagnosis not present

## 2018-05-27 DIAGNOSIS — M1990 Unspecified osteoarthritis, unspecified site: Secondary | ICD-10-CM

## 2018-05-27 DIAGNOSIS — Z9221 Personal history of antineoplastic chemotherapy: Secondary | ICD-10-CM | POA: Insufficient documentation

## 2018-05-27 DIAGNOSIS — K7581 Nonalcoholic steatohepatitis (NASH): Secondary | ICD-10-CM | POA: Insufficient documentation

## 2018-05-27 DIAGNOSIS — Z79810 Long term (current) use of selective estrogen receptor modulators (SERMs): Secondary | ICD-10-CM | POA: Insufficient documentation

## 2018-05-27 DIAGNOSIS — Z923 Personal history of irradiation: Secondary | ICD-10-CM | POA: Diagnosis not present

## 2018-05-27 DIAGNOSIS — Z7984 Long term (current) use of oral hypoglycemic drugs: Secondary | ICD-10-CM

## 2018-05-27 DIAGNOSIS — Z170 Estrogen receptor positive status [ER+]: Secondary | ICD-10-CM

## 2018-05-27 LAB — CBC WITH DIFFERENTIAL/PLATELET
ABS IMMATURE GRANULOCYTES: 0.02 10*3/uL (ref 0.00–0.07)
Basophils Absolute: 0 10*3/uL (ref 0.0–0.1)
Basophils Relative: 1 %
Eosinophils Absolute: 0.2 10*3/uL (ref 0.0–0.5)
Eosinophils Relative: 3 %
HEMATOCRIT: 45.1 % (ref 36.0–46.0)
HEMOGLOBIN: 14.5 g/dL (ref 12.0–15.0)
Immature Granulocytes: 0 %
LYMPHS PCT: 37 %
Lymphs Abs: 2.4 10*3/uL (ref 0.7–4.0)
MCH: 29.8 pg (ref 26.0–34.0)
MCHC: 32.2 g/dL (ref 30.0–36.0)
MCV: 92.6 fL (ref 80.0–100.0)
MONO ABS: 0.5 10*3/uL (ref 0.1–1.0)
MONOS PCT: 8 %
NEUTROS ABS: 3.3 10*3/uL (ref 1.7–7.7)
Neutrophils Relative %: 51 %
Platelets: 201 10*3/uL (ref 150–400)
RBC: 4.87 MIL/uL (ref 3.87–5.11)
RDW: 12.6 % (ref 11.5–15.5)
WBC: 6.4 10*3/uL (ref 4.0–10.5)
nRBC: 0 % (ref 0.0–0.2)

## 2018-05-27 LAB — COMPREHENSIVE METABOLIC PANEL
ALBUMIN: 3.9 g/dL (ref 3.5–5.0)
ALT: 63 U/L — ABNORMAL HIGH (ref 0–44)
AST: 52 U/L — AB (ref 15–41)
Alkaline Phosphatase: 69 U/L (ref 38–126)
Anion gap: 8 (ref 5–15)
BILIRUBIN TOTAL: 0.7 mg/dL (ref 0.3–1.2)
BUN: 23 mg/dL — AB (ref 6–20)
CHLORIDE: 101 mmol/L (ref 98–111)
CO2: 32 mmol/L (ref 22–32)
Calcium: 9.6 mg/dL (ref 8.9–10.3)
Creatinine, Ser: 0.66 mg/dL (ref 0.44–1.00)
GFR calc Af Amer: >60 mL/min (ref >60)
GFR calc non Af Amer: >60 mL/min (ref >60)
GLUCOSE: 124 mg/dL — AB (ref 70–99)
POTASSIUM: 4.4 mmol/L (ref 3.5–5.1)
Sodium: 141 mmol/L (ref 135–145)
Total Protein: 7.2 g/dL (ref 6.5–8.1)

## 2018-05-27 NOTE — Progress Notes (Signed)
Patient here for follow up no changes since her last appt. Last clinical breast exam in July with Dr. Bary Castilla.

## 2018-05-27 NOTE — Assessment & Plan Note (Addendum)
Stage I Breast cancer ER/PR/her 2 neu POS- currently on tamoxifen. Tolerating well.  Stable.  #Clinically no dental recurrence.  June 2019 mammogram normal.;  Continue extended antihormone therapy.   # Elevated LFTs~? Cirrhossi/NASH; gastric varices s/p egd- sep 2018- Dr.Wohl.  Stable.  # hot flashes/ vasomotor symptoms-stable.  # BMD- Normal Feb 2017; continue calcium and vitamin D.  Discussed the role of bone modifying agents if patient had osteoporosis.   # DISPOSITION: # BMD in next 1-2 weeks; will call with results/recommendations. # follow up in 6 months/labs prior/cbc/cmp-Dr.B

## 2018-05-27 NOTE — Progress Notes (Signed)
[place] OFFICE PROGRESS NOTE  Patient Care Team: Lavera Guise, MD as PCP - General (Internal Medicine) Bary Castilla, Forest Gleason, MD (General Surgery) Dion Body, MD as Referring Physician (Family Medicine) Forest Gleason, MD (Inactive) (Oncology)  Cancer Staging No matching staging information was found for the patient.    Oncology History   # July 2016- 1.carcinoma of breast(left) T1 C. N0 M0 status post ultrasound-guided biopsy Estrogen receptor positive. Progesterone receptor positive.  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 chemotherapy on hold since November because of cellulitis in the chest wall. 4.started on Herceptin May 22, 2014.  Cause of recurrent chest wall  infection chemotherapy was put on hold. 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  Patient is on multiple antibiotic and as per infectious disease specialist similar to stay on antibiotic for 4 months. So chemotherapy has been discontinued. Maintenance Herceptin as well as patient was started on letrozole from June 12, 2014 6.  Port was removed because of Pseudomonas infection of the pocket (August, 2016) 7.  Patient is finishing up Herceptin in August of 2016 now on letrozole;   # FEB 2017- TAMOXIFEN [poor tolerance to AI]   # Jan 2019- BCI- intermediate risk- recommend EXTENDED TAM  # Cirrhosis/ ? Etiology; gastric varices [Dr.Wohl]  # Hemochromatosis FHx ---------------------------------------------------------------   DIAGNOSIS: BREAST CA; Triple positive  STAGE:  I       ;GOALS: curative  CURRENT/MOST RECENT THERAPY: Tamoxifen      Carcinoma of upper-outer quadrant of  left breast in female, estrogen receptor positive ([place])        INTERVAL HISTORY:  Danielle Wallace 58 y.o.  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  Patient's hot flashes are stable.  Denies any new lumps or bumps.  Chronic mild joint pains.  Otherwise not any worse.  She continues with tamoxifen.  Denies any vaginal discharge or bleeding.  Review of Systems  Constitutional: Negative for chills, diaphoresis, fever, malaise/fatigue and weight loss.  HENT: Negative for nosebleeds and sore throat.   Eyes: Negative for double vision.  Respiratory: Negative for cough, hemoptysis, sputum production, shortness of breath and wheezing.   Cardiovascular: Negative for chest pain, palpitations, orthopnea and leg swelling.  Gastrointestinal: Negative for abdominal pain, blood in stool, constipation, diarrhea, heartburn, melena, nausea and vomiting.  Genitourinary: Negative for dysuria, frequency and urgency.  Musculoskeletal: Positive for joint pain.  Skin: Negative.  Negative for itching and rash.  Neurological: Negative for dizziness, tingling, focal weakness, weakness and headaches.  Endo/Heme/Allergies: Does not bruise/bleed easily.  Psychiatric/Behavioral: Negative for depression. The patient is not nervous/anxious and does not have insomnia.      PAST MEDICAL HISTORY :  Past Medical History:  Diagnosis Date  . Arthritis 2006  . Asthma   . Breast cancer ([place]) 10/14/2013   18 mm, T1c, N0; ER/ PR positive,her 2 neu overexpressed. Adjuvant chemo/ herceptin.  . Cancer [facility]) 2006   Renal cell carcinoma; cryosurgery treatment, right side  . Cirrhosis ([place])   . Collapsed lung 2007  . Diabetes mellitus without complication ([place]) 7035   Metformin  . Diffuse cystic  mastopathy   . Motion sickness    any moving vehicle  . Orthodontics    braces  . Personal history of chemotherapy   .  Sinus problem     PAST SURGICAL HISTORY :   Past Surgical History:  Procedure Laterality Date  . BREAST BIOPSY Right 2005   negative  . BREAST BIOPSY Left 08/23/2007   negative  . BREAST BIOPSY Left 10/24/2013   positive  . BREAST BIOPSY Left 07/2002   neg  . BREAST BIOPSY Left 08/23/2007   neg  . BREAST SURGERY Left 10/2011   Cyst Aspirationapocrine metaplasia, ductal cells and bone cells, hypo-cellular  . BREAST SURGERY Left 2009   ADH on stereotactic biopsy, 1.5 mm focus.  Marland Kitchen BREAST SURGERY Left 2003   fibrocystic changes with ductal hyperplasia without atypia.  Marland Kitchen BREAST SURGERY Left    mastectomy  . BREAST SURGERY Left April 11, 2014   Removal of implant, debridement chest wall Dr.Coan  . CESAREAN SECTION  1991  . CHOLECYSTECTOMY  1990  . COLONOSCOPY  08/26/2013   Verdie Shire, M.D. normal.  . CRYOABLATION  2005, 2010  . CYST REMOVAL NECK  10/2011   Dr. Tami Ribas  . ESOPHAGOGASTRODUODENOSCOPY (EGD) WITH PROPOFOL N/A 01/16/2017   Procedure: ESOPHAGOGASTRODUODENOSCOPY (EGD) WITH PROPOFOL;  Surgeon: Lucilla Lame, MD;  Location: [place];  Service: Gastroenterology;  Laterality: N/A;  Diabetic - oral meds  . INCISION AND DRAINAGE Left Dec 2015, Feb 2016   Dr Tula Nakayama  . MASTECTOMY Left 11/24/2013   positive  . PORTACATH PLACEMENT  11/24/13    FAMILY HISTORY :   Family History  Problem Relation Age of Onset  . Liver cancer Father   . Hemochromatosis Father   . Liver disease Father   . Diabetes Mother   . Hypertension Mother   . Breast cancer Paternal Aunt 61  . Ovarian cancer Maternal Grandmother   . Diabetes Sister   . Hemachromatosis Daughter     SOCIAL HISTORY:   Social History   Tobacco Use  . Smoking status: Never Smoker  . Smokeless tobacco: Never Used  Substance Use Topics  . Alcohol use: Not Currently    Comment: occasionally, none recently  . Drug use: No    ALLERGIES:  is allergic to exemestane; floxin [ofloxacin]; gluten meal; levofloxacin;  linezolid; taxotere [docetaxel]; hydrocodone; sulfa antibiotics; codeine; penicillins; and vancomycin.  MEDICATIONS:  Current Outpatient Medications  Medication Sig Dispense Refill  . albuterol (PROVENTIL) (2.5 MG/3ML) 0.083% nebulizer solution Take 3 mLs (2.5 mg total) by nebulization every 6 (six) hours as needed for wheezing or shortness of breath. 360 mL 3  . Calcium Carb-Cholecalciferol (CALCIUM + D3) 600-200 MG-UNIT TABS Take 1 tablet by mouth 2 (two) times daily. 180 tablet 3  . canagliflozin (INVOKANA) 300 MG TABS tablet TAKE 1 TABLET BY MOUTH EVERY DAY    . diphenhydrAMINE (BENADRYL) 25 MG tablet Take 25 mg by mouth every 6 (six) hours as needed.    Marland Kitchen EPINEPHrine (EPIPEN 2-PAK) 0.3 mg/0.3 mL IJ SOAJ injection Inject 1 Units into the skin as needed.    . etodolac (LODINE) 400 MG tablet TAKE 1 TABLET BY MOUTH 3 TIMES A DAY AS NEEDED FOR PAIN 90 tablet 2  . fluticasone (FLONASE) 50 MCG/ACT nasal spray Place into both nostrils daily.    Marland Kitchen glipiZIDE (GLUCOTROL XL) 5 MG 24 hr tablet Take 2.5 mg by mouth daily with supper.    Marland Kitchen imipramine (TOFRANIL) 25 MG tablet Take 25 mg by  mouth at bedtime.    Marland Kitchen levocetirizine (XYZAL) 5 MG tablet Take 5 mg by mouth every evening.    . metFORMIN (GLUCOPHAGE) 500 MG tablet Take 2,000 mg by mouth every evening.     . nadolol (CORGARD) 80 MG tablet Take 1 tablet (80 mg total) by mouth daily. 30 tablet 11  . omeprazole (PRILOSEC) 40 MG capsule Take 1 capsule (40 mg total) by mouth daily. 30 capsule 3  . PROAIR HFA 108 (90 Base) MCG/ACT inhaler 2 puffs 2 (two) times daily as needed.    . tamoxifen (NOLVADEX) 20 MG tablet Take 1 tablet (20 mg total) by mouth daily. 90 tablet 3  . valsartan-hydrochlorothiazide (DIOVAN-HCT) 80-12.5 MG tablet TAKE 1 TABLET BY MOUTH EVERY DAY 90 tablet 2   No current facility-administered medications for this visit.     PHYSICAL EXAMINATION: ECOG PERFORMANCE STATUS: 0 - Asymptomatic  BP 113/76 (BP Location: Right Arm, Patient  Position: Sitting)   Pulse 76   Temp 97.9 F (36.6 C) (Tympanic)   Resp 12   Ht 5' 5" (1.651 m)   Wt 210 lb 4.8 oz (95.4 kg)   BMI 35.00 kg/m   Filed Weights   05/27/18 1028  Weight: 210 lb 4.8 oz (95.4 kg)    Physical Exam  Constitutional: She is oriented to person, place, and time and well-developed, well-nourished, and in no distress.  HENT:  Head: Normocephalic and atraumatic.  Mouth/Throat: Oropharynx is clear and moist. No oropharyngeal exudate.  Eyes: Pupils are equal, round, and reactive to light.  Neck: Normal range of motion. Neck supple.  Cardiovascular: Normal rate and regular rhythm.  Pulmonary/Chest: No respiratory distress. She has no wheezes.  Abdominal: Soft. Bowel sounds are normal. She exhibits no distension and no mass. There is no abdominal tenderness. There is no rebound and no guarding.  Musculoskeletal: Normal range of motion.        General: No tenderness or edema.  Neurological: She is alert and oriented to person, place, and time.  Skin: Skin is warm.  Right BREAST exam (in the presence of nurse)- no unusual skin changes or dominant masses felt.   Left -mastectomy noted surgical scars noted.  No new lumps or bumps.   Psychiatric: Affect normal.         LABORATORY DATA:  I have reviewed the data as listed    Component Value Date/Time   NA 141 05/27/2018 1001   NA 138 09/04/2014 1358   K 4.4 05/27/2018 1001   K 4.0 09/04/2014 1358   CL 101 05/27/2018 1001   CL 99 (L) 09/04/2014 1358   CO2 32 05/27/2018 1001   CO2 31 09/04/2014 1358   GLUCOSE 124 (H) 05/27/2018 1001   GLUCOSE 136 (H) 09/04/2014 1358   BUN 23 (H) 05/27/2018 1001   BUN 18 09/04/2014 1358   CREATININE 0.66 05/27/2018 1001   CREATININE 0.53 09/04/2014 1358   CALCIUM 9.6 05/27/2018 1001   CALCIUM 9.7 09/04/2014 1358   PROT 7.2 05/27/2018 1001   PROT 7.0 12/15/2016 1438   PROT 7.8 09/04/2014 1358   ALBUMIN 3.9 05/27/2018 1001   ALBUMIN 4.4 12/15/2016 1438   ALBUMIN  4.1 09/04/2014 1358   AST 52 (H) 05/27/2018 1001   AST 32 09/04/2014 1358   ALT 63 (H) 05/27/2018 1001   ALT 33 09/04/2014 1358   ALKPHOS 69 05/27/2018 1001   ALKPHOS 73 09/04/2014 1358   BILITOT 0.7 05/27/2018 1001   BILITOT 0.5 12/15/2016 1438   BILITOT  0.6 09/04/2014 1358   GFRNONAA >60 05/27/2018 1001   GFRNONAA >60 09/04/2014 1358   GFRAA >60 05/27/2018 1001   GFRAA >60 09/04/2014 1358    No results found for: SPEP, UPEP  Lab Results  Component Value Date   WBC 6.4 05/27/2018   NEUTROABS 3.3 05/27/2018   HGB 14.5 05/27/2018   HCT 45.1 05/27/2018   MCV 92.6 05/27/2018   PLT 201 05/27/2018      Chemistry      Component Value Date/Time   NA 141 05/27/2018 1001   NA 138 09/04/2014 1358   K 4.4 05/27/2018 1001   K 4.0 09/04/2014 1358   CL 101 05/27/2018 1001   CL 99 (L) 09/04/2014 1358   CO2 32 05/27/2018 1001   CO2 31 09/04/2014 1358   BUN 23 (H) 05/27/2018 1001   BUN 18 09/04/2014 1358   CREATININE 0.66 05/27/2018 1001   CREATININE 0.53 09/04/2014 1358      Component Value Date/Time   CALCIUM 9.6 05/27/2018 1001   CALCIUM 9.7 09/04/2014 1358   ALKPHOS 69 05/27/2018 1001   ALKPHOS 73 09/04/2014 1358   AST 52 (H) 05/27/2018 1001   AST 32 09/04/2014 1358   ALT 63 (H) 05/27/2018 1001   ALT 33 09/04/2014 1358   BILITOT 0.7 05/27/2018 1001   BILITOT 0.5 12/15/2016 1438   BILITOT 0.6 09/04/2014 1358       RADIOGRAPHIC STUDIES: I have personally reviewed the radiological images as listed and agreed with the findings in the report. No results found.   ASSESSMENT & PLAN:  Carcinoma of upper-outer quadrant of left breast in female, estrogen receptor positive ([place]) Stage I Breast cancer ER/PR/her 2 neu POS- currently on tamoxifen. Tolerating well.  Stable.  #Clinically no dental recurrence.  June 2019 mammogram normal.;  Continue extended antihormone therapy.   # Elevated LFTs~? Cirrhossi/NASH; gastric varices s/p egd- sep 2018- Dr.Wohl.  Stable.  # hot  flashes/ vasomotor symptoms-stable.  # BMD- Normal Feb 2017; continue calcium and vitamin D.  Discussed the role of bone modifying agents if patient had osteoporosis.   # DISPOSITION: # BMD in next 1-2 weeks; will call with results/recommendations. # follow up in 6 months/labs prior/cbc/cmp-Dr.B     No orders of the defined types were placed in this encounter.  All questions were answered. The patient knows to call the clinic with any problems, questions or concerns.      Cammie Sickle, MD 05/27/2018 6:01 PM

## 2018-05-28 ENCOUNTER — Other Ambulatory Visit: Payer: Self-pay | Admitting: *Deleted

## 2018-05-28 DIAGNOSIS — Z79811 Long term (current) use of aromatase inhibitors: Secondary | ICD-10-CM

## 2018-05-28 DIAGNOSIS — C50412 Malignant neoplasm of upper-outer quadrant of left female breast: Secondary | ICD-10-CM

## 2018-05-28 DIAGNOSIS — Z170 Estrogen receptor positive status [ER+]: Secondary | ICD-10-CM

## 2018-06-28 ENCOUNTER — Ambulatory Visit (INDEPENDENT_AMBULATORY_CARE_PROVIDER_SITE_OTHER): Payer: BLUE CROSS/BLUE SHIELD | Admitting: Adult Health

## 2018-06-28 ENCOUNTER — Encounter: Payer: Self-pay | Admitting: Adult Health

## 2018-06-28 ENCOUNTER — Other Ambulatory Visit: Payer: Self-pay

## 2018-06-28 VITALS — BP 122/82 | HR 70 | Ht 65.0 in | Wt 210.0 lb

## 2018-06-28 DIAGNOSIS — E119 Type 2 diabetes mellitus without complications: Secondary | ICD-10-CM

## 2018-06-28 DIAGNOSIS — F320 Major depressive disorder, single episode, mild: Secondary | ICD-10-CM | POA: Diagnosis not present

## 2018-06-28 DIAGNOSIS — I10 Essential (primary) hypertension: Secondary | ICD-10-CM

## 2018-06-28 NOTE — Progress Notes (Signed)
[facility] [place], [place] [address]  Internal MEDICINE  Office Visit Note  Patient Name: Danielle Wallace  [id number]  [id number]  Date of Service: 06/28/2018  Chief Complaint  Patient presents with  . Depression    having some new symptoms that would like to be discussed   . Sore Throat    that seems to come and go , gets real sore at night ,     HPI  Pt is here for follow up.  Pt reports she has been having some new/worsening symptoms of depression. She does not currently take any medications for depression. She also reports a sore throat at night, that is intermittent. She would also like to discuss her A1C that was taken on 04/16/18.      Current Medication: Outpatient Encounter Medications as of 06/28/2018  Medication Sig Note  . albuterol (PROVENTIL) (2.5 MG/3ML) 0.083% nebulizer solution Take 3 mLs (2.5 mg total) by nebulization every 6 (six) hours as needed for wheezing or shortness of breath.   . Calcium Carb-Cholecalciferol (CALCIUM + D3) 600-200 MG-UNIT TABS Take 1 tablet by mouth 2 (two) times daily.   . canagliflozin (INVOKANA) 300 MG TABS tablet TAKE 1 TABLET BY MOUTH EVERY DAY 11/29/2014: Received from: [facility]  . diphenhydrAMINE (BENADRYL) 25 MG tablet Take 25 mg by mouth every 6 (six) hours as needed.   Marland Kitchen EPINEPHrine (EPIPEN 2-PAK) 0.3 mg/0.3 mL IJ SOAJ injection Inject 1 Units into the skin as needed.   . etodolac (LODINE) 400 MG tablet TAKE 1 TABLET BY MOUTH 3 TIMES A DAY AS NEEDED FOR PAIN   . fluticasone (FLONASE) 50 MCG/ACT nasal spray Place into both nostrils daily.   Marland Kitchen glipiZIDE (GLUCOTROL XL) 5 MG 24 hr tablet Take 2.5 mg by mouth daily with supper.   Marland Kitchen imipramine (TOFRANIL) 25 MG tablet Take 25 mg by mouth at bedtime.   . metFORMIN (GLUCOPHAGE) 500 MG tablet Take 2,000 mg by mouth every evening.    . nadolol (CORGARD) 80 MG tablet Take 1 tablet (80 mg total) by mouth daily.   Marland Kitchen omeprazole (PRILOSEC) 40 MG  capsule Take 1 capsule (40 mg total) by mouth daily.   Marland Kitchen PROAIR HFA 108 (90 Base) MCG/ACT inhaler 2 puffs 2 (two) times daily as needed.   . tamoxifen (NOLVADEX) 20 MG tablet Take 1 tablet (20 mg total) by mouth daily.   Marland Kitchen levocetirizine (XYZAL) 5 MG tablet Take 5 mg by mouth every evening.   . valsartan-hydrochlorothiazide (DIOVAN-HCT) 80-12.5 MG tablet TAKE 1 TABLET BY MOUTH EVERY DAY    No facility-administered encounter medications on file as of 06/28/2018.     Surgical History: Past Surgical History:  Procedure Laterality Date  . BREAST BIOPSY Right 2005   negative  . BREAST BIOPSY Left 08/23/2007   negative  . BREAST BIOPSY Left 10/24/2013   positive  . BREAST BIOPSY Left 07/2002   neg  . BREAST BIOPSY Left 08/23/2007   neg  . BREAST SURGERY Left 10/2011   Cyst Aspirationapocrine metaplasia, ductal cells and bone cells, hypo-cellular  . BREAST SURGERY Left 2009   ADH on stereotactic biopsy, 1.5 mm focus.  Marland Kitchen BREAST SURGERY Left 2003   fibrocystic changes with ductal hyperplasia without atypia.  Marland Kitchen BREAST SURGERY Left    mastectomy  . BREAST SURGERY Left April 11, 2014   Removal of implant, debridement chest wall Dr.Coan  . CESAREAN SECTION  1991  . CHOLECYSTECTOMY  1990  .  COLONOSCOPY  08/26/2013   Verdie Shire, M.D. normal.  . CRYOABLATION  2005, 2010  . CYST REMOVAL NECK  10/2011   Dr. Tami Ribas  . ESOPHAGOGASTRODUODENOSCOPY (EGD) WITH PROPOFOL N/A 01/16/2017   Procedure: ESOPHAGOGASTRODUODENOSCOPY (EGD) WITH PROPOFOL;  Surgeon: Lucilla Lame, MD;  Location: [facility];  Service: Gastroenterology;  Laterality: N/A;  Diabetic - oral meds  . INCISION AND DRAINAGE Left Dec 2015, Feb 2016   Dr Tula Nakayama  . MASTECTOMY Left 11/24/2013   positive  . PORTACATH PLACEMENT  11/24/13    Medical History: Past Medical History:  Diagnosis Date  . Arthritis 2006  . Asthma   . Breast cancer ([place]) 10/14/2013   18 mm, T1c, N0; ER/ PR positive,her 2 neu overexpressed. Adjuvant chemo/  herceptin.  . Cancer [facility]) 2006   Renal cell carcinoma; cryosurgery treatment, right side  . Cirrhosis ([place])   . Collapsed lung 2007  . Diabetes mellitus without complication ([place]) 3299   Metformin  . Diffuse cystic mastopathy   . Motion sickness    any moving vehicle  . Orthodontics    braces  . Personal history of chemotherapy   . Sinus problem     Family History: Family History  Problem Relation Age of Onset  . Liver cancer Father   . Hemochromatosis Father   . Liver disease Father   . Diabetes Mother   . Hypertension Mother   . Breast cancer Paternal Aunt 60  . Ovarian cancer Maternal Grandmother   . Diabetes Sister   . Hemachromatosis Daughter     Social History   Socioeconomic History  . Marital status: Married    Spouse name: Not on file  . Number of children: Not on file  . Years of education: Not on file  . Highest education level: Not on file  Occupational History  . Not on file  Social Needs  . Financial resource strain: Not on file  . Food insecurity:    Worry: Not on file    Inability: Not on file  . Transportation needs:    Medical: Not on file    Non-medical: Not on file  Tobacco Use  . Smoking status: Never Smoker  . Smokeless tobacco: Never Used  Substance and Sexual Activity  . Alcohol use: Not Currently    Comment: occasionally, none recently  . Drug use: No  . Sexual activity: Not on file  Lifestyle  . Physical activity:    Days per week: Not on file    Minutes per session: Not on file  . Stress: Not on file  Relationships  . Social connections:    Talks on phone: Not on file    Gets together: Not on file    Attends religious service: Not on file    Active member of club or organization: Not on file    Attends meetings of clubs or organizations: Not on file    Relationship status: Not on file  . Intimate partner violence:    Fear of current or ex partner: Not on file    Emotionally abused: Not on file    Physically abused:  Not on file    Forced sexual activity: Not on file  Other Topics Concern  . Not on file  Social History Narrative  . Not on file      Review of Systems  Constitutional: Negative for chills, fatigue and unexpected weight change.  HENT: Negative for congestion, rhinorrhea, sneezing and sore throat.   Eyes: Negative for photophobia,  pain and redness.  Respiratory: Negative for cough, chest tightness and shortness of breath.   Cardiovascular: Negative for chest pain and palpitations.  Gastrointestinal: Negative for abdominal pain, constipation, diarrhea, nausea and vomiting.  Endocrine: Negative.   Genitourinary: Negative for dysuria and frequency.  Musculoskeletal: Negative for arthralgias, back pain, joint swelling and neck pain.  Skin: Negative for rash.  Allergic/Immunologic: Negative.   Neurological: Negative for tremors and numbness.  Hematological: Negative for adenopathy. Does not bruise/bleed easily.  Psychiatric/Behavioral: Negative for behavioral problems and sleep disturbance. The patient is not nervous/anxious.     Vital Signs: BP 122/82   Pulse 70   Ht 5' 5"  (1.651 m)   Wt 210 lb (95.3 kg)   SpO2 96%   BMI 34.95 kg/m    Physical Exam Vitals signs and nursing note reviewed.  Constitutional:      General: She is not in acute distress.    Appearance: She is well-developed. She is not diaphoretic.  HENT:     Head: Normocephalic and atraumatic.     Mouth/Throat:     Pharynx: No oropharyngeal exudate.  Eyes:     Pupils: Pupils are equal, round, and reactive to light.  Neck:     Musculoskeletal: Normal range of motion and neck supple.     Thyroid: No thyromegaly.     Vascular: No JVD.     Trachea: No tracheal deviation.  Cardiovascular:     Rate and Rhythm: Normal rate and regular rhythm.     Heart sounds: Normal heart sounds. No murmur. No friction rub. No gallop.   Pulmonary:     Effort: Pulmonary effort is normal. No respiratory distress.     Breath  sounds: Normal breath sounds. No wheezing or rales.  Chest:     Chest wall: No tenderness.  Abdominal:     Palpations: Abdomen is soft.     Tenderness: There is no abdominal tenderness. There is no guarding.  Musculoskeletal: Normal range of motion.  Lymphadenopathy:     Cervical: No cervical adenopathy.  Skin:    General: Skin is warm and dry.  Neurological:     Mental Status: She is alert and oriented to person, place, and time.     Cranial Nerves: No cranial nerve deficit.  Psychiatric:        Behavior: Behavior normal.        Thought Content: Thought content normal.        Judgment: Judgment normal.    Assessment/Plan: 1. Depression, major, single episode, mild ([place]) Pt declined to start any medications at this time.  Will continue to follow up with her at future visits.   2. Essential hypertension Stable, well controlled today.   3. Diabetes mellitus without complication (HCC) Pts Z6X was 6.4 a few months ago.  Will continue to monitor at this time. Instructed patient on dietary modifications to lower A1c.   General Counseling: sherene plancarte understanding of the findings of todays visit and agrees with plan of treatment. I have discussed any further diagnostic evaluation that may be needed or ordered today. We also reviewed her medications today. she has been encouraged to call the office with any questions or concerns that should arise related to todays visit.    No orders of the defined types were placed in this encounter.   No orders of the defined types were placed in this encounter.   Time spent: 25 Minutes   This patient was seen by Orson Gear AGNP-C in Collaboration with  Dr Lavera Guise as a part of collaborative care agreement     Kendell Bane AGNP-C Internal medicine

## 2018-06-28 NOTE — Patient Instructions (Signed)
Diabetes Mellitus and Nutrition, Adult  When you have diabetes (diabetes mellitus), it is very important to have healthy eating habits because your blood sugar (glucose) levels are greatly affected by what you eat and drink. Eating healthy foods in the appropriate amounts, at about the same times every day, can help you:  · Control your blood glucose.  · Lower your risk of heart disease.  · Improve your blood pressure.  · Reach or maintain a healthy weight.  Every person with diabetes is different, and each person has different needs for a meal plan. Your health care provider may recommend that you work with a diet and nutrition specialist (dietitian) to make a meal plan that is best for you. Your meal plan may vary depending on factors such as:  · The calories you need.  · The medicines you take.  · Your weight.  · Your blood glucose, blood pressure, and cholesterol levels.  · Your activity level.  · Other health conditions you have, such as heart or kidney disease.  How do carbohydrates affect me?  Carbohydrates, also called carbs, affect your blood glucose level more than any other type of food. Eating carbs naturally raises the amount of glucose in your blood. Carb counting is a method for keeping track of how many carbs you eat. Counting carbs is important to keep your blood glucose at a healthy level, especially if you use insulin or take certain oral diabetes medicines.  It is important to know how many carbs you can safely have in each meal. This is different for every person. Your dietitian can help you calculate how many carbs you should have at each meal and for each snack.  Foods that contain carbs include:  · Bread, cereal, rice, pasta, and crackers.  · Potatoes and corn.  · Peas, beans, and lentils.  · Milk and yogurt.  · Fruit and juice.  · Desserts, such as cakes, cookies, ice cream, and candy.  How does alcohol affect me?  Alcohol can cause a sudden decrease in blood glucose (hypoglycemia),  especially if you use insulin or take certain oral diabetes medicines. Hypoglycemia can be a life-threatening condition. Symptoms of hypoglycemia (sleepiness, dizziness, and confusion) are similar to symptoms of having too much alcohol.  If your health care provider says that alcohol is safe for you, follow these guidelines:  · Limit alcohol intake to no more than 1 drink per day for nonpregnant women and 2 drinks per day for men. One drink equals 12 oz of beer, 5 oz of wine, or 1½ oz of hard liquor.  · Do not drink on an empty stomach.  · Keep yourself hydrated with water, diet soda, or unsweetened iced tea.  · Keep in mind that regular soda, juice, and other mixers may contain a lot of sugar and must be counted as carbs.  What are tips for following this plan?    Reading food labels  · Start by checking the serving size on the "Nutrition Facts" label of packaged foods and drinks. The amount of calories, carbs, fats, and other nutrients listed on the label is based on one serving of the item. Many items contain more than one serving per package.  · Check the total grams (g) of carbs in one serving. You can calculate the number of servings of carbs in one serving by dividing the total carbs by 15. For example, if a food has 30 g of total carbs, it would be equal to 2   servings of carbs.  · Check the number of grams (g) of saturated and trans fats in one serving. Choose foods that have low or no amount of these fats.  · Check the number of milligrams (mg) of salt (sodium) in one serving. Most people should limit total sodium intake to less than 2,300 mg per day.  · Always check the nutrition information of foods labeled as "low-fat" or "nonfat". These foods may be higher in added sugar or refined carbs and should be avoided.  · Talk to your dietitian to identify your daily goals for nutrients listed on the label.  Shopping  · Avoid buying canned, premade, or processed foods. These foods tend to be high in fat, sodium,  and added sugar.  · Shop around the outside edge of the grocery store. This includes fresh fruits and vegetables, bulk grains, fresh meats, and fresh dairy.  Cooking  · Use low-heat cooking methods, such as baking, instead of high-heat cooking methods like deep frying.  · Cook using healthy oils, such as olive, canola, or sunflower oil.  · Avoid cooking with butter, cream, or high-fat meats.  Meal planning  · Eat meals and snacks regularly, preferably at the same times every day. Avoid going long periods of time without eating.  · Eat foods high in fiber, such as fresh fruits, vegetables, beans, and whole grains. Talk to your dietitian about how many servings of carbs you can eat at each meal.  · Eat 4-6 ounces (oz) of lean protein each day, such as lean meat, chicken, fish, eggs, or tofu. One oz of lean protein is equal to:  ? 1 oz of meat, chicken, or fish.  ? 1 egg.  ? ¼ cup of tofu.  · Eat some foods each day that contain healthy fats, such as avocado, nuts, seeds, and fish.  Lifestyle  · Check your blood glucose regularly.  · Exercise regularly as told by your health care provider. This may include:  ? 150 minutes of moderate-intensity or vigorous-intensity exercise each week. This could be brisk walking, biking, or water aerobics.  ? Stretching and doing strength exercises, such as yoga or weightlifting, at least 2 times a week.  · Take medicines as told by your health care provider.  · Do not use any products that contain nicotine or tobacco, such as cigarettes and e-cigarettes. If you need help quitting, ask your health care provider.  · Work with a counselor or diabetes educator to identify strategies to manage stress and any emotional and social challenges.  Questions to ask a health care provider  · Do I need to meet with a diabetes educator?  · Do I need to meet with a dietitian?  · What number can I call if I have questions?  · When are the best times to check my blood glucose?  Where to find more  information:  · American Diabetes Association: diabetes.org  · Academy of Nutrition and Dietetics: www.eatright.org  · National Institute of Diabetes and Digestive and Kidney Diseases (NIH): www.niddk.nih.gov  Summary  · A healthy meal plan will help you control your blood glucose and maintain a healthy lifestyle.  · Working with a diet and nutrition specialist (dietitian) can help you make a meal plan that is best for you.  · Keep in mind that carbohydrates (carbs) and alcohol have immediate effects on your blood glucose levels. It is important to count carbs and to use alcohol carefully.  This information is not intended to   replace advice given to you by your health care provider. Make sure you discuss any questions you have with your health care provider.  Document Released: 01/23/2005 Document Revised: 11/26/2016 Document Reviewed: 06/02/2016  [employer]vier Interactive Patient Education © 2019 [employer]

## 2018-07-19 DIAGNOSIS — E119 Type 2 diabetes mellitus without complications: Secondary | ICD-10-CM | POA: Diagnosis not present

## 2018-08-07 ENCOUNTER — Other Ambulatory Visit: Payer: Self-pay | Admitting: Internal Medicine

## 2018-08-23 ENCOUNTER — Other Ambulatory Visit: Payer: Self-pay

## 2018-08-23 ENCOUNTER — Other Ambulatory Visit: Payer: Self-pay | Admitting: Nurse Practitioner

## 2018-08-23 DIAGNOSIS — C50412 Malignant neoplasm of upper-outer quadrant of left female breast: Secondary | ICD-10-CM

## 2018-08-23 MED ORDER — ETODOLAC 400 MG PO TABS: ORAL_TABLET | ORAL | 1 refills | Status: DC

## 2018-09-06 ENCOUNTER — Other Ambulatory Visit: Payer: Self-pay

## 2018-09-06 MED ORDER — OMEPRAZOLE 40 MG PO CPDR: 40.0000 mg | DELAYED_RELEASE_CAPSULE | Freq: Every day | ORAL | 3 refills | Status: DC

## 2018-09-21 ENCOUNTER — Encounter: Payer: Self-pay | Admitting: Adult Health

## 2018-10-11 ENCOUNTER — Other Ambulatory Visit: Payer: Self-pay

## 2018-10-11 ENCOUNTER — Encounter: Payer: Self-pay | Admitting: Nurse Practitioner

## 2018-10-11 ENCOUNTER — Ambulatory Visit (INDEPENDENT_AMBULATORY_CARE_PROVIDER_SITE_OTHER): Payer: BLUE CROSS/BLUE SHIELD | Admitting: Nurse Practitioner

## 2018-10-11 VITALS — BP 129/77 | HR 84 | Resp 16 | Ht 66.0 in | Wt 207.8 lb

## 2018-10-11 DIAGNOSIS — Z124 Encounter for screening for malignant neoplasm of cervix: Secondary | ICD-10-CM | POA: Diagnosis not present

## 2018-10-11 DIAGNOSIS — E1165 Type 2 diabetes mellitus with hyperglycemia: Secondary | ICD-10-CM

## 2018-10-11 DIAGNOSIS — R300 Dysuria: Secondary | ICD-10-CM

## 2018-10-11 DIAGNOSIS — K1230 Oral mucositis (ulcerative), unspecified: Secondary | ICD-10-CM

## 2018-10-11 DIAGNOSIS — K2100 Gastro-esophageal reflux disease with esophagitis, without bleeding: Secondary | ICD-10-CM

## 2018-10-11 DIAGNOSIS — Z0001 Encounter for general adult medical examination with abnormal findings: Secondary | ICD-10-CM | POA: Diagnosis not present

## 2018-10-11 DIAGNOSIS — K121 Other forms of stomatitis: Secondary | ICD-10-CM

## 2018-10-11 DIAGNOSIS — R5383 Other fatigue: Secondary | ICD-10-CM | POA: Diagnosis not present

## 2018-10-11 DIAGNOSIS — N390 Urinary tract infection, site not specified: Secondary | ICD-10-CM

## 2018-10-11 LAB — POCT URINALYSIS DIPSTICK
Bilirubin, UA: POSITIVE
Glucose, UA: POSITIVE — AB
Leukocytes, UA: NEGATIVE
Nitrite, UA: NEGATIVE
Protein, UA: NEGATIVE
Spec Grav, UA: 1.015 (ref 1.010–1.025)
Urobilinogen, UA: 0.2 E.U./dL
pH, UA: 6 (ref 5.0–8.0)

## 2018-10-11 LAB — POCT GLYCOSYLATED HEMOGLOBIN (HGB A1C): Hemoglobin A1C: 6.8 % — AB (ref 4.0–5.6)

## 2018-10-11 MED ORDER — CHLORHEXIDINE GLUCONATE 0.12 % MT SOLN: 15.0000 mL | Freq: Two times a day (BID) | OROMUCOSAL | 2 refills | Status: DC

## 2018-10-11 NOTE — Progress Notes (Signed)
[facility] [place], [place] [address]  Internal MEDICINE  Office Visit Note  Patient Name: Danielle Wallace  [id number]  [id number]  Date of Service: 10/19/2018   Pt is here for routine health maintenance examination  Chief Complaint  Patient presents with  . Annual Exam    pt have some concerns about thyroid, tongue swollen, cant stand the cold  . Gynecologic Exam  . Diabetes    A1C  . Fatigue    pt had been feeling very tired  . Quality Metric Gaps    foot exam     The patient is here for health maintenance exam today and pap smear. Today, she is c/o fatigue. Has increased boating and increased acid reflux type symptoms. She states that this does not change with certain foods, just happens nearly every times she eats. Blood sugars and blood pressures are doing well.     Current Medication: Outpatient Encounter Medications as of 10/11/2018  Medication Sig Note  . albuterol (PROVENTIL) (2.5 MG/3ML) 0.083% nebulizer solution Take 3 mLs (2.5 mg total) by nebulization every 6 (six) hours as needed for wheezing or shortness of breath.   . Calcium Carb-Cholecalciferol (CALCIUM + D3) 600-200 MG-UNIT TABS Take 1 tablet by mouth 2 (two) times daily.   . canagliflozin (INVOKANA) 300 MG TABS tablet TAKE 1 TABLET BY MOUTH EVERY DAY 11/29/2014: Received from: [facility]  . diphenhydrAMINE (BENADRYL) 25 MG tablet Take 25 mg by mouth every 6 (six) hours as needed.   Marland Kitchen EPINEPHrine (EPIPEN 2-PAK) 0.3 mg/0.3 mL IJ SOAJ injection Inject 1 Units into the skin as needed.   . etodolac (LODINE) 400 MG tablet TAKE 1 TABLET BY MOUTH 3 TIMES A DAY AS NEEDED FOR PAIN   . fluticasone (FLONASE) 50 MCG/ACT nasal spray Place into both nostrils daily.   Marland Kitchen glipiZIDE (GLUCOTROL XL) 5 MG 24 hr tablet Take 2.5 mg by mouth daily with supper.   Marland Kitchen imipramine (TOFRANIL) 25 MG tablet Take 25 mg by mouth at bedtime.   Marland Kitchen levocetirizine (XYZAL) 5 MG tablet Take 5 mg by mouth  every evening.   . metFORMIN (GLUCOPHAGE) 500 MG tablet Take 2,000 mg by mouth every evening.    . nadolol (CORGARD) 80 MG tablet Take 1 tablet (80 mg total) by mouth daily.   Marland Kitchen omeprazole (PRILOSEC) 40 MG capsule Take 1 capsule (40 mg total) by mouth daily.   Marland Kitchen PROAIR HFA 108 (90 Base) MCG/ACT inhaler 2 puffs 2 (two) times daily as needed.   . tamoxifen (NOLVADEX) 20 MG tablet Take 1 tablet (20 mg total) by mouth daily.   . valsartan-hydrochlorothiazide (DIOVAN-HCT) 80-12.5 MG tablet TAKE 1 TABLET BY MOUTH EVERY DAY   . chlorhexidine (PERIDEX) 0.12 % solution Use as directed 15 mLs in the mouth or throat 2 (two) times daily.   . nitrofurantoin, macrocrystal-monohydrate, (MACROBID) 100 MG capsule Take 1 capsule (100 mg total) by mouth 2 (two) times daily.    No facility-administered encounter medications on file as of 10/11/2018.     Surgical History: Past Surgical History:  Procedure Laterality Date  . BREAST BIOPSY Right 2005   negative  . BREAST BIOPSY Left 08/23/2007   negative  . BREAST BIOPSY Left 10/24/2013   positive  . BREAST BIOPSY Left 07/2002   neg  . BREAST BIOPSY Left 08/23/2007   neg  . BREAST SURGERY Left 10/2011   Cyst Aspirationapocrine metaplasia, ductal cells and bone cells, hypo-cellular  . BREAST  SURGERY Left 2009   ADH on stereotactic biopsy, 1.5 mm focus.  Marland Kitchen BREAST SURGERY Left 2003   fibrocystic changes with ductal hyperplasia without atypia.  Marland Kitchen BREAST SURGERY Left    mastectomy  . BREAST SURGERY Left April 11, 2014   Removal of implant, debridement chest wall Dr.Coan  . CESAREAN SECTION  1991  . CHOLECYSTECTOMY  1990  . COLONOSCOPY  08/26/2013   Verdie Shire, M.D. normal.  . CRYOABLATION  2005, 2010  . CYST REMOVAL NECK  10/2011   Dr. Tami Ribas  . ESOPHAGOGASTRODUODENOSCOPY (EGD) WITH PROPOFOL N/A 01/16/2017   Procedure: ESOPHAGOGASTRODUODENOSCOPY (EGD) WITH PROPOFOL;  Surgeon: Lucilla Lame, MD;  Location: [place];  Service: Gastroenterology;   Laterality: N/A;  Diabetic - oral meds  . INCISION AND DRAINAGE Left Dec 2015, Feb 2016   Dr Tula Nakayama  . MASTECTOMY Left 11/24/2013   positive  . PORTACATH PLACEMENT  11/24/13    Medical History: Past Medical History:  Diagnosis Date  . Arthritis 2006  . Asthma   . Breast cancer ([facility] [place] [facility]) 10/14/2013   18 mm, T1c, N0; ER/ PR positive,her 2 neu overexpressed. Adjuvant chemo/ herceptin.  . Cancer [facility]) 2006   Renal cell carcinoma; cryosurgery treatment, right side  . Cirrhosis ([place])   . Collapsed lung 2007  . Diabetes mellitus without complication ([place]) 6063   Metformin  . Diffuse cystic mastopathy   . Motion sickness    any moving vehicle  . Orthodontics    braces  . Personal history of chemotherapy   . Sinus problem     Family History: Family History  Problem Relation Age of Onset  . Liver cancer Father   . Hemochromatosis Father   . Liver disease Father   . Diabetes Mother   . Hypertension Mother   . Breast cancer Paternal Aunt 62  . Ovarian cancer Maternal Grandmother   . Diabetes Sister   . Hemachromatosis Daughter       Review of Systems  Constitutional: Positive for fatigue. Negative for activity change, chills and fever.  HENT: Negative for congestion, postnasal drip, rhinorrhea, sore throat and voice change.   Respiratory: Negative for cough, shortness of breath and wheezing.   Cardiovascular: Negative for chest pain, palpitations and leg swelling.  Gastrointestinal: Negative for nausea and vomiting.       Increased bloating and acid reflux type symptoms.   Endocrine: Negative for cold intolerance, heat intolerance and polydipsia.       Blood sugars doing well   Genitourinary: Positive for dysuria and frequency. Negative for flank pain.  Musculoskeletal: Positive for myalgias.  Skin: Negative for rash.  Allergic/Immunologic: Positive for environmental allergies.  Neurological: Positive for headaches. Negative for dizziness.  Hematological: Negative for  adenopathy.  Psychiatric/Behavioral: Negative for dysphoric mood. The patient is not nervous/anxious.     Today's Vitals   10/11/18 1513  BP: 129/77  Pulse: 84  Resp: 16  SpO2: 98%  Weight: 207 lb 12.8 oz (94.3 kg)  Height: 5' 6"  (1.676 m)   Body mass index is 33.54 kg/m.   Physical Exam Vitals signs and nursing note reviewed.  Constitutional:      General: She is not in acute distress.    Appearance: Normal appearance. She is well-developed. She is not diaphoretic.  HENT:     Head: Normocephalic and atraumatic.     Nose: Nose normal.     Mouth/Throat:     Pharynx: No oropharyngeal exudate.  Eyes:     Pupils: Pupils are  equal, round, and reactive to light.  Neck:     Musculoskeletal: Normal range of motion and neck supple.     Thyroid: No thyromegaly.     Vascular: No carotid bruit or JVD.     Trachea: No tracheal deviation.  Cardiovascular:     Rate and Rhythm: Normal rate and regular rhythm.     Pulses: Normal pulses.          Dorsalis pedis pulses are 2+ on the right side and 2+ on the left side.       Posterior tibial pulses are 2+ on the right side and 2+ on the left side.     Heart sounds: Normal heart sounds. No murmur. No friction rub. No gallop.   Pulmonary:     Effort: Pulmonary effort is normal. No respiratory distress.     Breath sounds: Normal breath sounds. No wheezing or rales.  Chest:     Chest wall: No tenderness.  Abdominal:     General: Bowel sounds are normal.     Palpations: Abdomen is soft.     Tenderness: There is abdominal tenderness.     Hernia: There is no hernia in the right inguinal area or left inguinal area.  Genitourinary:    General: Normal vulva.     Exam position: Supine.     Labia:        Right: No tenderness.        Left: No tenderness.      Vagina: Normal. No vaginal discharge, erythema or tenderness.     Cervix: No cervical motion tenderness, discharge or erythema.     Uterus: Normal.      Adnexa: Right adnexa normal and  left adnexa normal.     Comments: No tenderness, masses, or organomeglay present during bimanual exam . Musculoskeletal: Normal range of motion.     Right foot: Normal range of motion. No deformity.     Left foot: Normal range of motion. No deformity.  Feet:     Right foot:     Protective Sensation: 10 sites tested. 10 sites sensed.     Skin integrity: Skin integrity normal. No blister, skin breakdown or erythema.     Left foot:     Protective Sensation: 10 sites tested. 10 sites sensed.     Skin integrity: Skin integrity normal. No blister, skin breakdown or erythema.  Lymphadenopathy:     Cervical: No cervical adenopathy.     Lower Body: No right inguinal adenopathy. No left inguinal adenopathy.  Skin:    General: Skin is warm and dry.  Neurological:     Mental Status: She is alert and oriented to person, place, and time.     Cranial Nerves: No cranial nerve deficit.  Psychiatric:        Behavior: Behavior normal.        Thought Content: Thought content normal.        Judgment: Judgment normal.      LABS: Recent Results (from the past 2160 hour(s))  Pap IG and HPV (high risk) DNA detection     Status: None   Collection Time: 10/11/18  3:29 PM  Result Value Ref Range   Interpretation NILM     Comment: NEGATIVE FOR INTRAEPITHELIAL LESION OR MALIGNANCY.   Category NIL     Comment: Negative for Intraepithelial Lesion   Adequacy SECNI,PTSR     Comment: Satisfactory for evaluation. No endocervical component is identified. Partially obscuring thick areas are present.  Clinician Provided ICD10 Comment     Comment: Z12.4   Performed by: Comment     Comment: Orlean Bradford, Supervisory Cytotechnologist (ASCP)   Note: Comment     Comment: The Pap smear is a screening test designed to aid in the detection of premalignant and malignant conditions of the uterine cervix.  It is not a diagnostic procedure and should not be used as the sole means of detecting cervical cancer.  Both  false-positive and false-negative reports do occur.    Test Methodology CANCELED     Comment: The Thin Prep(R) Imager was unable to read this specimen.  Therefore a manual review was performed.  Result canceled by the ancillary.    HPV, high-risk CANCELED     Comment: The quantity of specimen remaining in the vial after Pap slide preparation was less than the 4 mL minimum cell suspension required. Low sample cellularity may be the cause.  See HPV, low volume rfx test result. This nucleic acid amplification high-risk HPV test detects thirteen high-risk types (16,18,31,33,35,39,45,51,52,56,58,59,68) without differentiation.  Result canceled by the ancillary.   HPV, low volume (reflex)     Status: None   Collection Time: 10/11/18  3:29 PM  Result Value Ref Range   HPV low volume reflex Negative Negative    Comment: This nucleic acid amplification test detects fourteen high-risk HPV types (16,18,31,33,35,39,45,51,52,56,58,59,66,68) without differentiation.   POCT HgB A1C     Status: Abnormal   Collection Time: 10/11/18  3:31 PM  Result Value Ref Range   Hemoglobin A1C 6.8 (A) 4.0 - 5.6 %   HbA1c POC (<> result, manual entry)     HbA1c, POC (prediabetic range)     HbA1c, POC (controlled diabetic range)    POCT Urinalysis Dipstick     Status: Abnormal   Collection Time: 10/11/18  3:31 PM  Result Value Ref Range   Color, UA     Clarity, UA     Glucose, UA Positive (A) Negative   Bilirubin, UA positive    Ketones, UA small    Spec Grav, UA 1.015 1.010 - 1.025   Blood, UA trace    pH, UA 6.0 5.0 - 8.0   Protein, UA Negative Negative   Urobilinogen, UA 0.2 0.2 or 1.0 E.U./dL   Nitrite, UA negative    Leukocytes, UA Negative Negative   Appearance     Odor    CULTURE, URINE COMPREHENSIVE     Status: Abnormal   Collection Time: 10/11/18  3:31 PM  Result Value Ref Range   Urine Culture, Comprehensive Final report (A)    Organism ID, Bacteria Proteus mirabilis (A)      Comment: 25,000-50,000 colony forming units per mL Cefazolin <=4 ug/mL Cefazolin with an MIC <=16 predicts susceptibility to the oral agents cefaclor, cefdinir, cefpodoxime, cefprozil, cefuroxime, cephalexin, and loracarbef when used for therapy of uncomplicated urinary tract infections due to E. coli, Klebsiella pneumoniae, and Proteus mirabilis.    Organism ID, Bacteria Comment (A)     Comment: Escherichia coli, identified by an automated biochemical system. 50,000-100,000 colony forming units per mL Cefazolin <=4 ug/mL Cefazolin with an MIC <=16 predicts susceptibility to the oral agents cefaclor, cefdinir, cefpodoxime, cefprozil, cefuroxime, cephalexin, and loracarbef when used for therapy of uncomplicated urinary tract infections due to E. coli, Klebsiella pneumoniae, and Proteus mirabilis.    ANTIMICROBIAL SUSCEPTIBILITY Comment     Comment:       ** S = Susceptible; I = Intermediate; R = Resistant **  P = Positive; N = Negative             MICS are expressed in micrograms per mL    Antibiotic                 RSLT#1    RSLT#2    RSLT#3    RSLT#4 Amoxicillin/Clavulanic Acid    S         S Ampicillin                     S         S Cefepime                       S         S Ceftriaxone                    S         S Cefuroxime                     S         S Ciprofloxacin                  S         S Ertapenem                      S         S Gentamicin                     S         S Imipenem                                 S Levofloxacin                   S         S Meropenem                      S         S Nitrofurantoin                 R         S Piperacillin/Tazobactam        S         S Tetracycline                   R         S Tobramycin                     S         S Trimethoprim/Sulfa             S         S   Comprehensive metabolic panel     Status: Abnormal   Collection Time: 10/12/18  8:11 AM  Result Value Ref Range   Glucose 137 (H)  65 - 99 mg/dL   BUN 18 6 - 24 mg/dL   Creatinine, Ser 0.74 0.57 - 1.00 mg/dL   GFR calc non Af Amer 89 >59 mL/min/1.73   GFR calc Af Amer 103 >59 mL/min/1.73   BUN/Creatinine Ratio 24 (H) 9 - 23   Sodium 141 134 - 144 mmol/L  Potassium 4.6 3.5 - 5.2 mmol/L   Chloride 96 96 - 106 mmol/L   CO2 27 20 - 29 mmol/L   Calcium 9.8 8.7 - 10.2 mg/dL   Total Protein 6.9 6.0 - 8.5 g/dL   Albumin 4.4 3.8 - 4.9 g/dL   Globulin, Total 2.5 1.5 - 4.5 g/dL   Albumin/Globulin Ratio 1.8 1.2 - 2.2   Bilirubin Total 0.5 0.0 - 1.2 mg/dL   Alkaline Phosphatase 85 39 - 117 IU/L   AST 37 0 - 40 IU/L   ALT 48 (H) 0 - 32 IU/L  CBC     Status: None   Collection Time: 10/12/18  8:11 AM  Result Value Ref Range   WBC 6.9 3.4 - 10.8 x10E3/uL   RBC 5.05 3.77 - 5.28 x10E6/uL   Hemoglobin 15.2 11.1 - 15.9 g/dL   Hematocrit 45.8 34.0 - 46.6 %   MCV 91 79 - 97 fL   MCH 30.1 26.6 - 33.0 pg   MCHC 33.2 31.5 - 35.7 g/dL   RDW 12.6 11.7 - 15.4 %   Platelets 216 150 - 450 x10E3/uL  Lipid Panel w/o Chol/HDL Ratio     Status: Abnormal   Collection Time: 10/12/18  8:11 AM  Result Value Ref Range   Cholesterol, Total 182 100 - 199 mg/dL   Triglycerides 174 (H) 0 - 149 mg/dL   HDL 42 >39 mg/dL   VLDL Cholesterol Cal 35 5 - 40 mg/dL   LDL Calculated 105 (H) 0 - 99 mg/dL  H Pylori, IGM, IGG, IGA AB     Status: None   Collection Time: 10/12/18  8:11 AM  Result Value Ref Range   H. pylori, IgG AbS 0.21 0.00 - 0.79 Index Value    Comment:                              Negative           <0.80                              Equivocal    0.80 - 0.89                              Positive           >0.89    H. pylori, IgA Abs <9.0 0.0 - 8.9 units    Comment:                                 Negative          <9.0                                 Equivocal   9.0 - 11.0                                 Positive         >11.0    H pylori, IgM Abs <9.0 0.0 - 8.9 units    Comment:  Negative           <9.0                                 Equivocal   9.0 - 11.0                                 Positive         >11.0 This test was developed and its performance characteristics determined by [facility]. It has not been cleared or approved by the Food and Drug Administration.   T4, free     Status: None   Collection Time: 10/12/18  8:11 AM  Result Value Ref Range   Free T4 1.22 0.82 - 1.77 ng/dL  TSH     Status: None   Collection Time: 10/12/18  8:11 AM  Result Value Ref Range   TSH 4.280 0.450 - 4.500 uIU/mL  VITAMIN D 25 Hydroxy (Vit-D Deficiency, Fractures)     Status: Abnormal   Collection Time: 10/12/18  8:11 AM  Result Value Ref Range   Vit D, 25-Hydroxy 23.1 (L) 30.0 - 100.0 ng/mL    Comment: Vitamin D deficiency has been defined by the [place] and an Endocrine Society practice guideline as a level of serum 25-OH vitamin D less than 20 ng/mL (1,2). The Endocrine Society went on to further define vitamin D insufficiency as a level between 21 and 29 ng/mL (2). 1. IOM (Institute of Medicine). 2010. Dietary reference    intakes for calcium and D. [place]: The    [employer]. 2. Holick MF, Binkley [place], Bischoff-Ferrari HA, et al.    Evaluation, treatment, and prevention of vitamin D    deficiency: an Endocrine Society clinical practice    guideline. JCEM. 2011 Jul; 96(7):1911-30.   T3     Status: None   Collection Time: 10/12/18  8:11 AM  Result Value Ref Range   T3, Total 150 71 - 180 ng/dL    Assessment/Plan: 1. Encounter for general adult medical examination with abnormal findings Annual health maintenance exam with pap smear performed today.   2. Uncontrolled type 2 diabetes mellitus with hyperglycemia (HCC) - POCT HgB A1C 6.8 today. Continue diabetic medication as prescribed .  3. Gastroesophageal reflux disease with esophagitis Will gget UGI with barium for further evaluation.  - DG UGI W SMALL BOWEL SINGLE CM; Future  4. Fatigue,  unspecified type Check labs for further evaluation.   5. Stomatitis and mucositis - chlorhexidine (PERIDEX) 0.12 % solution; Use as directed 15 mLs in the mouth or throat 2 (two) times daily.  Dispense: 400 mL; Refill: 2  6. Urinary tract infection without hematuria, site unspecified Start  macrobid 126m twice daily for 10 days. Send urine for culture and sensitivity and adjust abx as indicated.  - nitrofurantoin, macrocrystal-monohydrate, (MACROBID) 100 MG capsule; Take 1 capsule (100 mg total) by mouth 2 (two) times daily.  Dispense: 20 capsule; Refill: 0  7. Routine cervical smear - Pap IG and HPV (high risk) DNA detection  8. Dysuria - POCT Urinalysis Dipstick - CULTURE, URINE COMPREHENSIVE  General Counseling: MFranverbalizes understanding of the findings of todays visit and agrees with plan of treatment. I have discussed any further diagnostic evaluation that may be needed or ordered today. We also reviewed her medications today. she has been encouraged to call the  office with any questions or concerns that should arise related to todays visit.    Counseling:  Diabetes Counseling:  1. Addition of ACE inh/ ARB'S for nephroprotection. Microalbumin is updated  2. Diabetic foot care, prevention of complications. Podiatry consult 3. Exercise and lose weight.  4. Diabetic eye examination, Diabetic eye exam is updated  5. Monitor blood sugar closlely. nutrition counseling.  6. Sign and symptoms of hypoglycemia including shaking sweating,confusion and headaches.  This patient was seen by Leretha Pol FNP Collaboration with Dr Lavera Guise as a part of collaborative care agreement  Orders Placed This Encounter  Procedures  . CULTURE, URINE COMPREHENSIVE  . DG UGI W SMALL BOWEL SINGLE CM  . POCT HgB A1C  . POCT Urinalysis Dipstick    Meds ordered this encounter  Medications  . chlorhexidine (PERIDEX) 0.12 % solution    Sig: Use as directed 15 mLs in the mouth or throat 2  (two) times daily.    Dispense:  400 mL    Refill:  2    Order Specific Question:   Supervising Provider    Answer:   Lavera Guise [[id number]]  . nitrofurantoin, macrocrystal-monohydrate, (MACROBID) 100 MG capsule    Sig: Take 1 capsule (100 mg total) by mouth 2 (two) times daily.    Dispense:  20 capsule    Refill:  0    Order Specific Question:   Supervising Provider    Answer:   Lavera Guise [[id number]]    Time spent: Ola, MD  Internal Medicine

## 2018-10-12 ENCOUNTER — Other Ambulatory Visit: Payer: Self-pay | Admitting: Nurse Practitioner

## 2018-10-12 DIAGNOSIS — K210 Gastro-esophageal reflux disease with esophagitis: Secondary | ICD-10-CM | POA: Diagnosis not present

## 2018-10-12 DIAGNOSIS — I10 Essential (primary) hypertension: Secondary | ICD-10-CM | POA: Diagnosis not present

## 2018-10-12 DIAGNOSIS — Z0001 Encounter for general adult medical examination with abnormal findings: Secondary | ICD-10-CM | POA: Diagnosis not present

## 2018-10-12 DIAGNOSIS — E1165 Type 2 diabetes mellitus with hyperglycemia: Secondary | ICD-10-CM | POA: Diagnosis not present

## 2018-10-12 DIAGNOSIS — E559 Vitamin D deficiency, unspecified: Secondary | ICD-10-CM | POA: Diagnosis not present

## 2018-10-13 ENCOUNTER — Telehealth: Payer: Self-pay

## 2018-10-13 MED ORDER — NITROFURANTOIN MONOHYD MACRO 100 MG PO CAPS: 100.0000 mg | ORAL_CAPSULE | Freq: Two times a day (BID) | ORAL | 0 refills | Status: DC

## 2018-10-13 NOTE — Telephone Encounter (Signed)
-----   Message from Ronnell Freshwater, NP sent at 10/13/2018  9:28 AM EDT ----- Please let the patient know that urine sample did grow bacteria. I have added macrobid 152m twice daily for next 10 days. I sent this to her pharmacy. Thanks.

## 2018-10-13 NOTE — Progress Notes (Signed)
Please let the patient know that urine sample did grow bacteria. I have added macrobid 132m twice daily for next 10 days. I sent this to her pharmacy. Thanks.

## 2018-10-13 NOTE — Telephone Encounter (Signed)
Informed pt of urine results and her prescription has been sent to her pharmacy.

## 2018-10-14 ENCOUNTER — Telehealth: Payer: Self-pay

## 2018-10-14 LAB — LIPID PANEL W/O CHOL/HDL RATIO
Cholesterol, Total: 182 mg/dL (ref 100–199)
HDL: 42 mg/dL (ref >39)
LDL Calculated: 105 mg/dL — ABNORMAL HIGH (ref 0–99)
Triglycerides: 174 mg/dL — ABNORMAL HIGH (ref 0–149)
VLDL Cholesterol Cal: 35 mg/dL (ref 5–40)

## 2018-10-14 LAB — COMPREHENSIVE METABOLIC PANEL
ALT: 48 IU/L — ABNORMAL HIGH (ref 0–32)
AST: 37 IU/L (ref 0–40)
Albumin/Globulin Ratio: 1.8 (ref 1.2–2.2)
Albumin: 4.4 g/dL (ref 3.8–4.9)
Alkaline Phosphatase: 85 IU/L (ref 39–117)
BUN/Creatinine Ratio: 24 — ABNORMAL HIGH (ref 9–23)
BUN: 18 mg/dL (ref 6–24)
Bilirubin Total: 0.5 mg/dL (ref 0.0–1.2)
CO2: 27 mmol/L (ref 20–29)
Calcium: 9.8 mg/dL (ref 8.7–10.2)
Chloride: 96 mmol/L (ref 96–106)
Creatinine, Ser: 0.74 mg/dL (ref 0.57–1.00)
GFR calc Af Amer: 103 mL/min/{1.73_m2} (ref >59)
GFR calc non Af Amer: 89 mL/min/{1.73_m2} (ref >59)
Globulin, Total: 2.5 g/dL (ref 1.5–4.5)
Glucose: 137 mg/dL — ABNORMAL HIGH (ref 65–99)
Potassium: 4.6 mmol/L (ref 3.5–5.2)
Sodium: 141 mmol/L (ref 134–144)
Total Protein: 6.9 g/dL (ref 6.0–8.5)

## 2018-10-14 LAB — CBC
Hematocrit: 45.8 % (ref 34.0–46.6)
Hemoglobin: 15.2 g/dL (ref 11.1–15.9)
MCH: 30.1 pg (ref 26.6–33.0)
MCHC: 33.2 g/dL (ref 31.5–35.7)
MCV: 91 fL (ref 79–97)
Platelets: 216 10*3/uL (ref 150–450)
RBC: 5.05 x10E6/uL (ref 3.77–5.28)
RDW: 12.6 % (ref 11.7–15.4)
WBC: 6.9 10*3/uL (ref 3.4–10.8)

## 2018-10-14 LAB — T4, FREE: Free T4: 1.22 ng/dL (ref 0.82–1.77)

## 2018-10-14 LAB — VITAMIN D 25 HYDROXY (VIT D DEFICIENCY, FRACTURES): Vit D, 25-Hydroxy: 23.1 ng/mL — ABNORMAL LOW (ref 30.0–100.0)

## 2018-10-14 LAB — H PYLORI, IGM, IGG, IGA AB
H pylori, IgM Abs: <9 units (ref 0.0–8.9)
H. pylori, IgA Abs: <9 units (ref 0.0–8.9)
H. pylori, IgG AbS: 0.21 Index Value (ref 0.00–0.79)

## 2018-10-14 LAB — TSH: TSH: 4.28 u[IU]/mL (ref 0.450–4.500)

## 2018-10-14 LAB — T3: T3, Total: 150 ng/dL (ref 71–180)

## 2018-10-14 NOTE — Progress Notes (Signed)
Can you let the patient know that bacteria in urine is resistant to macrobid. Also resistant to doxycycline. Can you ask her if she can take ciprofloxacin? If so, I will send that in for her. Thanks.

## 2018-10-14 NOTE — Telephone Encounter (Signed)
As per pt she is feeling better with macrobid as per heather she can finished if she don't feels better we can send something else

## 2018-10-14 NOTE — Telephone Encounter (Signed)
-----   Message from Ronnell Freshwater, NP sent at 10/14/2018  2:23 PM EDT ----- Can you let the patient know that bacteria in urine is resistant to macrobid. Also resistant to doxycycline. Can you ask her if she can take ciprofloxacin? If so, I will send that in for her. Thanks.

## 2018-10-15 LAB — HPV, LOW VOLUME (REFLEX): HPV low volume reflex: NEGATIVE

## 2018-10-15 LAB — PAP IG AND HPV HIGH-RISK

## 2018-10-15 LAB — CULTURE, URINE COMPREHENSIVE

## 2018-10-18 ENCOUNTER — Ambulatory Visit: Payer: BLUE CROSS/BLUE SHIELD

## 2018-10-19 DIAGNOSIS — K1230 Oral mucositis (ulcerative), unspecified: Secondary | ICD-10-CM | POA: Insufficient documentation

## 2018-10-19 DIAGNOSIS — N390 Urinary tract infection, site not specified: Secondary | ICD-10-CM | POA: Insufficient documentation

## 2018-10-19 DIAGNOSIS — E1165 Type 2 diabetes mellitus with hyperglycemia: Secondary | ICD-10-CM | POA: Insufficient documentation

## 2018-10-19 DIAGNOSIS — K2100 Gastro-esophageal reflux disease with esophagitis, without bleeding: Secondary | ICD-10-CM | POA: Insufficient documentation

## 2018-10-19 DIAGNOSIS — R300 Dysuria: Secondary | ICD-10-CM | POA: Insufficient documentation

## 2018-10-19 DIAGNOSIS — K121 Other forms of stomatitis: Secondary | ICD-10-CM | POA: Insufficient documentation

## 2018-10-19 DIAGNOSIS — Z124 Encounter for screening for malignant neoplasm of cervix: Secondary | ICD-10-CM | POA: Insufficient documentation

## 2018-10-20 ENCOUNTER — Encounter: Payer: Self-pay | Admitting: Nurse Practitioner

## 2018-10-26 ENCOUNTER — Encounter: Payer: Self-pay | Admitting: Nurse Practitioner

## 2018-10-26 ENCOUNTER — Ambulatory Visit: Payer: BC Managed Care – PPO | Admitting: Nurse Practitioner

## 2018-10-26 ENCOUNTER — Other Ambulatory Visit: Payer: Self-pay

## 2018-10-26 VITALS — BP 127/75 | HR 73 | Temp 97.4°F

## 2018-10-26 DIAGNOSIS — K2100 Gastro-esophageal reflux disease with esophagitis, without bleeding: Secondary | ICD-10-CM

## 2018-10-26 DIAGNOSIS — N390 Urinary tract infection, site not specified: Secondary | ICD-10-CM

## 2018-10-26 DIAGNOSIS — M159 Polyosteoarthritis, unspecified: Secondary | ICD-10-CM

## 2018-10-26 MED ORDER — PANTOPRAZOLE SODIUM 40 MG PO TBEC: 40.0000 mg | DELAYED_RELEASE_TABLET | Freq: Every day | ORAL | 3 refills | Status: DC

## 2018-10-26 MED ORDER — CEPHALEXIN 500 MG PO CAPS: 500.0000 mg | ORAL_CAPSULE | Freq: Three times a day (TID) | ORAL | 0 refills | Status: DC

## 2018-10-26 NOTE — Progress Notes (Signed)
[facility] [place], [place] [address]  Internal MEDICINE  Telephone Visit  Patient Name: Danielle Wallace  [id number]  [id number]  Date of Service: 10/27/2018  I connected with the patient at 4:47pm by webcam and verified the patients identity using two identifiers.   I discussed the limitations, risks, security and privacy concerns of performing an evaluation and management service by webcam and the availability of in person appointments. I also discussed with the patient that there may be a patient responsible charge related to the service.  The patient expressed understanding and agrees to proceed.    Chief Complaint  Patient presents with  . Telephone Screen  . Medical Management of Chronic Issues     did not get ugi lost job, just finished antibiotic for uti  , still feel feverish and back pain  . Telephone Assessment    The patient has been contacted via webcam for follow up visit due to concerns for spread of novel coronavirus. The patient states that she has back pain with fever and nausea. Very similar to UTI in the past. Based on most recent culture results, cephalosporin antibiotics would treat best. Discussed allergy to penicillin and explained that keflex can have similar reaction. The patient voices understanding. Feels as though she has taken keflex in the past and done well with it.       Current Medication: Outpatient Encounter Medications as of 10/26/2018  Medication Sig Note  . albuterol (PROVENTIL) (2.5 MG/3ML) 0.083% nebulizer solution Take 3 mLs (2.5 mg total) by nebulization every 6 (six) hours as needed for wheezing or shortness of breath.   . Calcium Carb-Cholecalciferol (CALCIUM + D3) 600-200 MG-UNIT TABS Take 1 tablet by mouth 2 (two) times daily.   . canagliflozin (INVOKANA) 300 MG TABS tablet TAKE 1 TABLET BY MOUTH EVERY DAY 11/29/2014: Received from: [facility]  . chlorhexidine (PERIDEX) 0.12 % solution Use as  directed 15 mLs in the mouth or throat 2 (two) times daily.   . diphenhydrAMINE (BENADRYL) 25 MG tablet Take 25 mg by mouth every 6 (six) hours as needed.   Marland Kitchen EPINEPHrine (EPIPEN 2-PAK) 0.3 mg/0.3 mL IJ SOAJ injection Inject 1 Units into the skin as needed.   . etodolac (LODINE) 400 MG tablet TAKE 1 TABLET BY MOUTH  2 TIMES A DAY AS NEEDED FOR PAIN   . fluticasone (FLONASE) 50 MCG/ACT nasal spray Place into both nostrils daily.   Marland Kitchen glipiZIDE (GLUCOTROL XL) 5 MG 24 hr tablet Take 2.5 mg by mouth daily with supper.   Marland Kitchen imipramine (TOFRANIL) 25 MG tablet Take 25 mg by mouth at bedtime.   Marland Kitchen levocetirizine (XYZAL) 5 MG tablet Take 5 mg by mouth every evening.   . metFORMIN (GLUCOPHAGE) 500 MG tablet Take 2,000 mg by mouth every evening.    . nadolol (CORGARD) 80 MG tablet Take 1 tablet (80 mg total) by mouth daily.   Marland Kitchen omeprazole (PRILOSEC) 40 MG capsule Take 1 capsule (40 mg total) by mouth daily.   Marland Kitchen PROAIR HFA 108 (90 Base) MCG/ACT inhaler 2 puffs 2 (two) times daily as needed.   . tamoxifen (NOLVADEX) 20 MG tablet Take 1 tablet (20 mg total) by mouth daily.   . valsartan-hydrochlorothiazide (DIOVAN-HCT) 80-12.5 MG tablet TAKE 1 TABLET BY MOUTH EVERY DAY   . [DISCONTINUED] etodolac (LODINE) 400 MG tablet TAKE 1 TABLET BY MOUTH 3 TIMES A DAY AS NEEDED FOR PAIN   . cephALEXin (KEFLEX) 500 MG capsule Take  1 capsule (500 mg total) by mouth 3 (three) times daily.   . nitrofurantoin, macrocrystal-monohydrate, (MACROBID) 100 MG capsule Take 1 capsule (100 mg total) by mouth 2 (two) times daily. (Patient not taking: Reported on 10/26/2018)   . pantoprazole (PROTONIX) 40 MG tablet Take 1 tablet (40 mg total) by mouth daily.    No facility-administered encounter medications on file as of 10/26/2018.     Surgical History: Past Surgical History:  Procedure Laterality Date  . BREAST BIOPSY Right 2005   negative  . BREAST BIOPSY Left 08/23/2007   negative  . BREAST BIOPSY Left 10/24/2013   positive  .  BREAST BIOPSY Left 07/2002   neg  . BREAST BIOPSY Left 08/23/2007   neg  . BREAST SURGERY Left 10/2011   Cyst Aspirationapocrine metaplasia, ductal cells and bone cells, hypo-cellular  . BREAST SURGERY Left 2009   ADH on stereotactic biopsy, 1.5 mm focus.  Marland Kitchen BREAST SURGERY Left 2003   fibrocystic changes with ductal hyperplasia without atypia.  Marland Kitchen BREAST SURGERY Left    mastectomy  . BREAST SURGERY Left April 11, 2014   Removal of implant, debridement chest wall Dr.Coan  . CESAREAN SECTION  1991  . CHOLECYSTECTOMY  1990  . COLONOSCOPY  08/26/2013   Verdie Shire, M.D. normal.  . CRYOABLATION  2005, 2010  . CYST REMOVAL NECK  10/2011   Dr. Tami Ribas  . ESOPHAGOGASTRODUODENOSCOPY (EGD) WITH PROPOFOL N/A 01/16/2017   Procedure: ESOPHAGOGASTRODUODENOSCOPY (EGD) WITH PROPOFOL;  Surgeon: Lucilla Lame, MD;  Location: [place];  Service: Gastroenterology;  Laterality: N/A;  Diabetic - oral meds  . INCISION AND DRAINAGE Left Dec 2015, Feb 2016   Dr Tula Nakayama  . MASTECTOMY Left 11/24/2013   positive  . PORTACATH PLACEMENT  11/24/13    Medical History: Past Medical History:  Diagnosis Date  . Arthritis 2006  . Asthma   . Breast cancer ([place]) 10/14/2013   18 mm, T1c, N0; ER/ PR positive,her 2 neu overexpressed. Adjuvant chemo/ herceptin.  . Cancer [facility]) 2006   Renal cell carcinoma; cryosurgery treatment, right side  . Cirrhosis ([place])   . Collapsed lung 2007  . Diabetes mellitus without complication ([place]) 0277   Metformin  . Diffuse cystic mastopathy   . Motion sickness    any moving vehicle  . Orthodontics    braces  . Personal history of chemotherapy   . Sinus problem     Family History: Family History  Problem Relation Age of Onset  . Liver cancer Father   . Hemochromatosis Father   . Liver disease Father   . Diabetes Mother   . Hypertension Mother   . Breast cancer Paternal Aunt 63  . Ovarian cancer Maternal Grandmother   . Diabetes Sister   . Hemachromatosis Daughter      Social History   Socioeconomic History  . Marital status: Married    Spouse name: Not on file  . Number of children: Not on file  . Years of education: Not on file  . Highest education level: Not on file  Occupational History  . Not on file  Social Needs  . Financial resource strain: Not on file  . Food insecurity    Worry: Not on file    Inability: Not on file  . Transportation needs    Medical: Not on file    Non-medical: Not on file  Tobacco Use  . Smoking status: Never Smoker  . Smokeless tobacco: Never Used  Substance and Sexual Activity  .  Alcohol use: Not Currently    Comment: occasionally, none recently  . Drug use: No  . Sexual activity: Not on file  Lifestyle  . Physical activity    Days per week: Not on file    Minutes per session: Not on file  . Stress: Not on file  Relationships  . Social Herbalist on phone: Not on file    Gets together: Not on file    Attends religious service: Not on file    Active member of club or organization: Not on file    Attends meetings of clubs or organizations: Not on file    Relationship status: Not on file  . Intimate partner violence    Fear of current or ex partner: Not on file    Emotionally abused: Not on file    Physically abused: Not on file    Forced sexual activity: Not on file  Other Topics Concern  . Not on file  Social History Narrative  . Not on file      Review of Systems  Constitutional: Positive for fatigue and fever. Negative for activity change and chills.  HENT: Positive for sore throat. Negative for congestion, postnasal drip, rhinorrhea and voice change.   Respiratory: Negative for cough, shortness of breath and wheezing.   Cardiovascular: Negative for chest pain, palpitations and leg swelling.  Gastrointestinal: Negative for nausea and vomiting.       Increased bloating and acid reflux type symptoms. H. Pylori test negative. She states that she had to reschedule her UGI. Lost he  job due to [place] 19 and was unsure if insurance would still cover her. Will call to reschedule the appointment.   Endocrine: Negative for cold intolerance, heat intolerance and polydipsia.       Blood sugars doing well   Genitourinary: Positive for dysuria, frequency and urgency. Negative for flank pain.  Musculoskeletal: Positive for myalgias.  Skin: Negative for rash.  Allergic/Immunologic: Positive for environmental allergies.  Neurological: Positive for headaches. Negative for dizziness.  Hematological: Negative for adenopathy.  Psychiatric/Behavioral: Negative for dysphoric mood. The patient is not nervous/anxious.     Today's Vitals   10/26/18 1617  BP: 127/75  Pulse: 73  Temp: (!) 97.4 F (36.3 C)   There is no height or weight on file to calculate BMI.  Observation/Objective:   The patient is alert and oriented. She is pleasant and answers all questions appropriately. Breathing is non-labored. She is in no acute distress at this time.  The patient appears fatigued and as though she does not feel well.    Assessment/Plan: 1. Urinary tract infection without hematuria, site unspecified Based on urine culture results, start keflex 571m three times daily for 10 days. Advised she contact the office if symptoms persist despite treatment.  - cephALEXin (KEFLEX) 500 MG capsule; Take 1 capsule (500 mg total) by mouth 3 (three) times daily.  Dispense: 30 capsule; Refill: 0  2. Gastroesophageal reflux disease with esophagitis D/c omeprazole and start pantoprazole. Avoid trigger foods. Reschedule UGI and discuss the results at next visit.  - pantoprazole (PROTONIX) 40 MG tablet; Take 1 tablet (40 mg total) by mouth daily.  Dispense: 30 tablet; Refill: 3  3. Generalized osteoarthritis Reduce lodine to twice daily due to increased GERD symptoms.  - etodolac (LODINE) 400 MG tablet; TAKE 1 TABLET BY MOUTH  2 TIMES A DAY AS NEEDED FOR PAIN  Dispense: 60 tablet; Refill: 2  General  Counseling: MCarrellverbalizes understanding of  the findings of today's phone visit and agrees with plan of treatment. I have discussed any further diagnostic evaluation that may be needed or ordered today. We also reviewed her medications today. she has been encouraged to call the office with any questions or concerns that should arise related to todays visit.  This patient was seen by Leslie with Dr Lavera Guise as a part of collaborative care agreement  Meds ordered this encounter  Medications  . cephALEXin (KEFLEX) 500 MG capsule    Sig: Take 1 capsule (500 mg total) by mouth 3 (three) times daily.    Dispense:  30 capsule    Refill:  0    Order Specific Question:   Supervising Provider    Answer:   Lavera Guise [[id number]]  . pantoprazole (PROTONIX) 40 MG tablet    Sig: Take 1 tablet (40 mg total) by mouth daily.    Dispense:  30 tablet    Refill:  3    Please d/c omeprazole    Order Specific Question:   Supervising Provider    Answer:   Lavera Guise [place]  . etodolac (LODINE) 400 MG tablet    Sig: TAKE 1 TABLET BY MOUTH  2 TIMES A DAY AS NEEDED FOR PAIN    Dispense:  60 tablet    Refill:  2    Please note reduced dosing.    Order Specific Question:   Supervising Provider    Answer:   Lavera Guise [[id number]]    Time spent: 27 Minutes    Dr Lavera Guise Internal medicine

## 2018-10-27 DIAGNOSIS — C50412 Malignant neoplasm of upper-outer quadrant of left female breast: Secondary | ICD-10-CM | POA: Insufficient documentation

## 2018-10-27 MED ORDER — ETODOLAC 400 MG PO TABS: ORAL_TABLET | ORAL | 2 refills | Status: DC

## 2018-10-28 ENCOUNTER — Other Ambulatory Visit: Payer: Self-pay | Admitting: Nurse Practitioner

## 2018-10-28 ENCOUNTER — Telehealth: Payer: Self-pay

## 2018-10-28 DIAGNOSIS — K580 Irritable bowel syndrome with diarrhea: Secondary | ICD-10-CM

## 2018-10-28 MED ORDER — IMIPRAMINE HCL 25 MG PO TABS: 50.0000 mg | ORAL_TABLET | Freq: Every day | ORAL | 3 refills | Status: DC

## 2018-10-28 NOTE — Telephone Encounter (Signed)
Refilled imipramine 60m, taking two tablets at bedtime and sent to [facility]

## 2018-10-28 NOTE — Telephone Encounter (Signed)
Pt was notified.  

## 2018-10-28 NOTE — Progress Notes (Signed)
Refilled imipramine 75m, taking two tablets at bedtime and sent to [facility].

## 2018-11-08 ENCOUNTER — Other Ambulatory Visit: Payer: Self-pay | Admitting: Gastroenterology

## 2018-11-08 ENCOUNTER — Other Ambulatory Visit: Payer: Self-pay | Admitting: *Deleted

## 2018-11-08 DIAGNOSIS — Z1231 Encounter for screening mammogram for malignant neoplasm of breast: Secondary | ICD-10-CM

## 2018-11-09 ENCOUNTER — Ambulatory Visit: Payer: BC Managed Care – PPO

## 2018-11-20 ENCOUNTER — Other Ambulatory Visit: Payer: Self-pay | Admitting: Nurse Practitioner

## 2018-11-20 DIAGNOSIS — K580 Irritable bowel syndrome with diarrhea: Secondary | ICD-10-CM

## 2018-11-25 ENCOUNTER — Other Ambulatory Visit: Payer: BLUE CROSS/BLUE SHIELD

## 2018-11-25 ENCOUNTER — Ambulatory Visit: Payer: BLUE CROSS/BLUE SHIELD | Admitting: Internal Medicine

## 2018-12-01 ENCOUNTER — Other Ambulatory Visit: Payer: Self-pay

## 2018-12-01 ENCOUNTER — Inpatient Hospital Stay (HOSPITAL_BASED_OUTPATIENT_CLINIC_OR_DEPARTMENT_OTHER): Payer: BC Managed Care – PPO | Admitting: Internal Medicine

## 2018-12-01 ENCOUNTER — Inpatient Hospital Stay: Payer: BC Managed Care – PPO | Attending: Internal Medicine

## 2018-12-01 VITALS — BP 114/75 | HR 71 | Temp 97.6°F | Resp 20 | Ht 66.0 in | Wt 207.0 lb

## 2018-12-01 DIAGNOSIS — E119 Type 2 diabetes mellitus without complications: Secondary | ICD-10-CM | POA: Diagnosis not present

## 2018-12-01 DIAGNOSIS — Z923 Personal history of irradiation: Secondary | ICD-10-CM | POA: Insufficient documentation

## 2018-12-01 DIAGNOSIS — R1013 Epigastric pain: Secondary | ICD-10-CM

## 2018-12-01 DIAGNOSIS — Z9221 Personal history of antineoplastic chemotherapy: Secondary | ICD-10-CM | POA: Diagnosis not present

## 2018-12-01 DIAGNOSIS — Z79810 Long term (current) use of selective estrogen receptor modulators (SERMs): Secondary | ICD-10-CM

## 2018-12-01 DIAGNOSIS — R110 Nausea: Secondary | ICD-10-CM

## 2018-12-01 DIAGNOSIS — Z7984 Long term (current) use of oral hypoglycemic drugs: Secondary | ICD-10-CM | POA: Diagnosis not present

## 2018-12-01 DIAGNOSIS — Z170 Estrogen receptor positive status [ER+]: Secondary | ICD-10-CM | POA: Insufficient documentation

## 2018-12-01 DIAGNOSIS — Z85528 Personal history of other malignant neoplasm of kidney: Secondary | ICD-10-CM

## 2018-12-01 DIAGNOSIS — C50412 Malignant neoplasm of upper-outer quadrant of left female breast: Secondary | ICD-10-CM | POA: Diagnosis not present

## 2018-12-01 DIAGNOSIS — M25551 Pain in right hip: Secondary | ICD-10-CM | POA: Insufficient documentation

## 2018-12-01 DIAGNOSIS — K7460 Unspecified cirrhosis of liver: Secondary | ICD-10-CM | POA: Insufficient documentation

## 2018-12-01 DIAGNOSIS — M25552 Pain in left hip: Secondary | ICD-10-CM | POA: Diagnosis not present

## 2018-12-01 DIAGNOSIS — R232 Flushing: Secondary | ICD-10-CM

## 2018-12-01 DIAGNOSIS — Z9012 Acquired absence of left breast and nipple: Secondary | ICD-10-CM

## 2018-12-01 DIAGNOSIS — R140 Abdominal distension (gaseous): Secondary | ICD-10-CM | POA: Diagnosis not present

## 2018-12-01 DIAGNOSIS — Z79899 Other long term (current) drug therapy: Secondary | ICD-10-CM

## 2018-12-01 DIAGNOSIS — M1990 Unspecified osteoarthritis, unspecified site: Secondary | ICD-10-CM | POA: Diagnosis not present

## 2018-12-01 LAB — CBC WITH DIFFERENTIAL/PLATELET
Abs Immature Granulocytes: 0.02 10*3/uL (ref 0.00–0.07)
Basophils Absolute: 0 10*3/uL (ref 0.0–0.1)
Basophils Relative: 1 %
Eosinophils Absolute: 0.1 10*3/uL (ref 0.0–0.5)
Eosinophils Relative: 2 %
HCT: 44.6 % (ref 36.0–46.0)
Hemoglobin: 14.5 g/dL (ref 12.0–15.0)
Immature Granulocytes: 0 %
Lymphocytes Relative: 37 %
Lymphs Abs: 2.4 10*3/uL (ref 0.7–4.0)
MCH: 29.8 pg (ref 26.0–34.0)
MCHC: 32.5 g/dL (ref 30.0–36.0)
MCV: 91.6 fL (ref 80.0–100.0)
Monocytes Absolute: 0.5 10*3/uL (ref 0.1–1.0)
Monocytes Relative: 7 %
Neutro Abs: 3.4 10*3/uL (ref 1.7–7.7)
Neutrophils Relative %: 53 %
Platelets: 204 10*3/uL (ref 150–400)
RBC: 4.87 MIL/uL (ref 3.87–5.11)
RDW: 12.6 % (ref 11.5–15.5)
WBC: 6.5 10*3/uL (ref 4.0–10.5)
nRBC: 0 % (ref 0.0–0.2)

## 2018-12-01 LAB — COMPREHENSIVE METABOLIC PANEL
ALT: 65 U/L — ABNORMAL HIGH (ref 0–44)
AST: 47 U/L — ABNORMAL HIGH (ref 15–41)
Albumin: 3.8 g/dL (ref 3.5–5.0)
Alkaline Phosphatase: 91 U/L (ref 38–126)
Anion gap: 9 (ref 5–15)
BUN: 24 mg/dL — ABNORMAL HIGH (ref 6–20)
CO2: 28 mmol/L (ref 22–32)
Calcium: 9.3 mg/dL (ref 8.9–10.3)
Chloride: 101 mmol/L (ref 98–111)
Creatinine, Ser: 0.63 mg/dL (ref 0.44–1.00)
GFR calc Af Amer: >60 mL/min (ref >60)
GFR calc non Af Amer: >60 mL/min (ref >60)
Glucose, Bld: 164 mg/dL — ABNORMAL HIGH (ref 70–99)
Potassium: 4 mmol/L (ref 3.5–5.1)
Sodium: 138 mmol/L (ref 135–145)
Total Bilirubin: 0.6 mg/dL (ref 0.3–1.2)
Total Protein: 7.3 g/dL (ref 6.5–8.1)

## 2018-12-01 NOTE — Progress Notes (Signed)
[facility] OFFICE PROGRESS NOTE  Patient Care Team: Lavera Guise, MD as PCP - General (Internal Medicine) Bary Castilla, Forest Gleason, MD (General Surgery) Dion Body, MD as Referring Physician (Family Medicine) Forest Gleason, MD (Inactive) (Oncology)  Cancer Staging No matching staging information was found for the patient.    Oncology History Overview Note  # July 2016- 1.carcinoma of breast(left) T1 C. N0 M0 status post ultrasound-guided biopsy Estrogen receptor positive. Progesterone receptor positive.  HER-2/neu amplified by FISH (3.8)  status pos  tnipple sparing mastectomy on the left side with reconstruction (July, 2016) Final staging is  yP1C yNOsn M0  stage 1 c  2.MRSA infection associated with left breast implant (August, 2015) 3.patient started chemotherapy with [facility] from 11th August, 2015. chemotherapy on hold since November because of cellulitis in the chest wall. 4.started on Herceptin May 22, 2014.  Cause of recurrent chest wall  infection chemotherapy was put on hold. 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  Patient is on multiple antibiotic and as per infectious disease specialist similar to stay on antibiotic for 4 months. So chemotherapy has been discontinued. Maintenance Herceptin as well as patient was started on letrozole from June 12, 2014 6.  Port was removed because of Pseudomonas infection of the pocket (August, 2016) 7.  Patient is finishing up Herceptin in August of 2016 now on letrozole;   # FEB 2017- TAMOXIFEN [poor tolerance to AI]   # Jan 2019- BCI- intermediate risk- recommend EXTENDED TAM  # Cirrhosis/ ? Etiology; gastric varices [Dr.Wohl]  # Hemochromatosis FHx ---------------------------------------------------------------   DIAGNOSIS: BREAST CA; Triple positive  STAGE:  I       ;GOALS: curative  CURRENT/MOST RECENT THERAPY: Tamoxifen    Carcinoma of upper-outer  quadrant of left breast in female, estrogen receptor positive ([place])     INTERVAL HISTORY:  Danielle Wallace 58 y.o.  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  Patient notes to have abdominal discomfort epigastric region intermittent nausea.  She is awaiting an upper GI series with gastroenterology.  She also complains of abdominal discomfort/abdominal bloating.  Also complains of mild to moderate bilateral hip pain for the last many months.  However gotten worse the last 2 to 3 weeks.  No radiation.    Chronic hot flashes.  Not any worse.  Review of Systems  Constitutional: Negative for chills, diaphoresis, fever, malaise/fatigue and weight loss.  HENT: Negative for nosebleeds and sore throat.   Eyes: Negative for double vision.  Respiratory: Negative for cough, hemoptysis, sputum production, shortness of breath and wheezing.   Cardiovascular: Negative for chest pain, palpitations, orthopnea and leg swelling.  Gastrointestinal: Negative for abdominal pain, blood in stool, constipation, diarrhea, heartburn, melena, nausea and vomiting.  Genitourinary: Negative for dysuria, frequency and urgency.  Musculoskeletal: Positive for joint pain.  Skin: Negative.  Negative for itching and rash.  Neurological: Negative for dizziness, tingling, focal weakness, weakness and headaches.  Endo/Heme/Allergies: Does not bruise/bleed easily.  Psychiatric/Behavioral: Negative for depression. The patient is not nervous/anxious and does not have insomnia.      PAST MEDICAL HISTORY :  Past Medical History:  Diagnosis Date  . Arthritis 2006  . Asthma   . Breast cancer ([employer]) 10/14/2013   18 mm, T1c, N0; ER/ PR positive,her 2 neu overexpressed. Adjuvant chemo/ herceptin.  . Cancer [facility]) 2006   Renal cell carcinoma; cryosurgery  treatment, right side  . Cirrhosis ([place])   . Collapsed lung 2007  .  Diabetes mellitus without complication ([place]) 8921   Metformin  . Diffuse cystic mastopathy   . Motion sickness    any moving vehicle  . Orthodontics    braces  . Personal history of chemotherapy   . Sinus problem     PAST SURGICAL HISTORY :   Past Surgical History:  Procedure Laterality Date  . BREAST BIOPSY Right 2005   negative  . BREAST BIOPSY Left 08/23/2007   negative  . BREAST BIOPSY Left 10/24/2013   positive  . BREAST BIOPSY Left 07/2002   neg  . BREAST BIOPSY Left 08/23/2007   neg  . BREAST SURGERY Left 10/2011   Cyst Aspirationapocrine metaplasia, ductal cells and bone cells, hypo-cellular  . BREAST SURGERY Left 2009   ADH on stereotactic biopsy, 1.5 mm focus.  Marland Kitchen BREAST SURGERY Left 2003   fibrocystic changes with ductal hyperplasia without atypia.  Marland Kitchen BREAST SURGERY Left    mastectomy  . BREAST SURGERY Left April 11, 2014   Removal of implant, debridement chest wall Dr.Coan  . CESAREAN SECTION  1991  . CHOLECYSTECTOMY  1990  . COLONOSCOPY  08/26/2013   Verdie Shire, M.D. normal.  . CRYOABLATION  2005, 2010  . CYST REMOVAL NECK  10/2011   Dr. Tami Ribas  . ESOPHAGOGASTRODUODENOSCOPY (EGD) WITH PROPOFOL N/A 01/16/2017   Procedure: ESOPHAGOGASTRODUODENOSCOPY (EGD) WITH PROPOFOL;  Surgeon: Lucilla Lame, MD;  Location: [place];  Service: Gastroenterology;  Laterality: N/A;  Diabetic - oral meds  . INCISION AND DRAINAGE Left Dec 2015, Feb 2016   Dr Tula Nakayama  . MASTECTOMY Left 11/24/2013   positive  . PORTACATH PLACEMENT  11/24/13    FAMILY HISTORY :   Family History  Problem Relation Age of Onset  . Liver cancer Father   . Hemochromatosis Father   . Liver disease Father   . Diabetes Mother   . Hypertension Mother   . Breast cancer Paternal Aunt 20  . Ovarian cancer Maternal Grandmother   . Diabetes Sister   . Hemachromatosis Daughter     SOCIAL HISTORY:   Social History   Tobacco Use  . Smoking status: Never Smoker  . Smokeless tobacco: Never Used   Substance Use Topics  . Alcohol use: Not Currently    Comment: occasionally, none recently  . Drug use: No    ALLERGIES:  is allergic to exemestane; floxin [ofloxacin]; gluten meal; levofloxacin; linezolid; taxotere [docetaxel]; hydrocodone; sulfa antibiotics; codeine; penicillins; and vancomycin.  MEDICATIONS:  Current Outpatient Medications  Medication Sig Dispense Refill  . Calcium Carb-Cholecalciferol (CALCIUM + D3) 600-200 MG-UNIT TABS Take 1 tablet by mouth 2 (two) times daily. 180 tablet 3  . canagliflozin (INVOKANA) 300 MG TABS tablet TAKE 1 TABLET BY MOUTH EVERY DAY    . diphenhydrAMINE (BENADRYL) 25 MG tablet Take 25 mg by mouth every 6 (six) hours as needed.    . etodolac (LODINE) 400 MG tablet TAKE 1 TABLET BY MOUTH  2 TIMES A DAY AS NEEDED FOR PAIN 60 tablet 2  . fluticasone (FLONASE) 50 MCG/ACT nasal spray Place into both nostrils daily.    Marland Kitchen glipiZIDE (GLUCOTROL XL) 5 MG 24 hr tablet Take 2.5 mg by mouth daily with supper.    Marland Kitchen imipramine (TOFRANIL) 25 MG tablet TAKE 2 TABLETS (50 MG TOTAL) BY MOUTH AT BEDTIME. 180 tablet 2  . metFORMIN (GLUCOPHAGE) 500 MG tablet Take 2,000 mg by mouth every evening.     Marland Kitchen  nadolol (CORGARD) 80 MG tablet Take 1 tablet (80 mg total) by mouth daily. **PLEASE SCHEDULE 6 MONTH FOLLOW UP APPT** 90 tablet 1  . pantoprazole (PROTONIX) 40 MG tablet Take 1 tablet (40 mg total) by mouth daily. 30 tablet 3  . PROAIR HFA 108 (90 Base) MCG/ACT inhaler 2 puffs 2 (two) times daily as needed.    . tamoxifen (NOLVADEX) 20 MG tablet Take 1 tablet (20 mg total) by mouth daily. 90 tablet 3  . valsartan-hydrochlorothiazide (DIOVAN-HCT) 80-12.5 MG tablet TAKE 1 TABLET BY MOUTH EVERY DAY 90 tablet 2  . albuterol (PROVENTIL) (2.5 MG/3ML) 0.083% nebulizer solution Take 3 mLs (2.5 mg total) by nebulization every 6 (six) hours as needed for wheezing or shortness of breath. (Patient not taking: Reported on 12/01/2018) 360 mL 3  . chlorhexidine (PERIDEX) 0.12 % solution  Use as directed 15 mLs in the mouth or throat 2 (two) times daily. (Patient not taking: Reported on 12/01/2018) 400 mL 2  . EPINEPHrine (EPIPEN 2-PAK) 0.3 mg/0.3 mL IJ SOAJ injection Inject 1 Units into the skin as needed.     No current facility-administered medications for this visit.     PHYSICAL EXAMINATION: ECOG PERFORMANCE STATUS: 0 - Asymptomatic  BP 114/75   Pulse 71   Temp 97.6 F (36.4 C) (Tympanic)   Resp 20   Ht _0  (1.676 m)   Wt 207 lb (93.9 kg)   BMI 33.41 kg/m   Filed Weights   12/01/18 1030  Weight: 207 lb (93.9 kg)    Physical Exam  Constitutional: She is oriented to person, place, and time and well-developed, well-nourished, and in no distress.  HENT:  Head: Normocephalic and atraumatic.  Mouth/Throat: Oropharynx is clear and moist. No oropharyngeal exudate.  Eyes: Pupils are equal, round, and reactive to light.  Neck: Normal range of motion. Neck supple.  Cardiovascular: Normal rate and regular rhythm.  Pulmonary/Chest: No respiratory distress. She has no wheezes.  Abdominal: Soft. Bowel sounds are normal. She exhibits no distension and no mass. There is no abdominal tenderness. There is no rebound and no guarding.  Musculoskeletal: Normal range of motion.        General: No tenderness or edema.  Neurological: She is alert and oriented to person, place, and time.  Skin: Skin is warm.     Psychiatric: Affect normal.         LABORATORY DATA:  I have reviewed the data as listed    Component Value Date/Time   NA 138 12/01/2018 1000   NA 141 10/12/2018 0811   NA 138 09/04/2014 1358   K 4.0 12/01/2018 1000   K 4.0 09/04/2014 1358   CL 101 12/01/2018 1000   CL 99 (L) 09/04/2014 1358   CO2 28 12/01/2018 1000   CO2 31 09/04/2014 1358   GLUCOSE 164 (H) 12/01/2018 1000   GLUCOSE 136 (H) 09/04/2014 1358   BUN 24 (H) 12/01/2018 1000   BUN 18 10/12/2018 0811   BUN 18 09/04/2014 1358   CREATININE 0.63 12/01/2018 1000   CREATININE 0.53  09/04/2014 1358   CALCIUM 9.3 12/01/2018 1000   CALCIUM 9.7 09/04/2014 1358   PROT 7.3 12/01/2018 1000   PROT 6.9 10/12/2018 0811   PROT 7.8 09/04/2014 1358   ALBUMIN 3.8 12/01/2018 1000   ALBUMIN 4.4 10/12/2018 0811   ALBUMIN 4.1 09/04/2014 1358   AST 47 (H) 12/01/2018 1000   AST 32 09/04/2014 1358   ALT 65 (H) 12/01/2018 1000   ALT 33 09/04/2014  1358   ALKPHOS 91 12/01/2018 1000   ALKPHOS 73 09/04/2014 1358   BILITOT 0.6 12/01/2018 1000   BILITOT 0.5 10/12/2018 0811   BILITOT 0.6 09/04/2014 1358   GFRNONAA >60 12/01/2018 1000   GFRNONAA >60 09/04/2014 1358   GFRAA >60 12/01/2018 1000   GFRAA >60 09/04/2014 1358    No results found for: SPEP, UPEP  Lab Results  Component Value Date   WBC 6.5 12/01/2018   NEUTROABS 3.4 12/01/2018   HGB 14.5 12/01/2018   HCT 44.6 12/01/2018   MCV 91.6 12/01/2018   PLT 204 12/01/2018      Chemistry      Component Value Date/Time   NA 138 12/01/2018 1000   NA 141 10/12/2018 0811   NA 138 09/04/2014 1358   K 4.0 12/01/2018 1000   K 4.0 09/04/2014 1358   CL 101 12/01/2018 1000   CL 99 (L) 09/04/2014 1358   CO2 28 12/01/2018 1000   CO2 31 09/04/2014 1358   BUN 24 (H) 12/01/2018 1000   BUN 18 10/12/2018 0811   BUN 18 09/04/2014 1358   CREATININE 0.63 12/01/2018 1000   CREATININE 0.53 09/04/2014 1358      Component Value Date/Time   CALCIUM 9.3 12/01/2018 1000   CALCIUM 9.7 09/04/2014 1358   ALKPHOS 91 12/01/2018 1000   ALKPHOS 73 09/04/2014 1358   AST 47 (H) 12/01/2018 1000   AST 32 09/04/2014 1358   ALT 65 (H) 12/01/2018 1000   ALT 33 09/04/2014 1358   BILITOT 0.6 12/01/2018 1000   BILITOT 0.5 10/12/2018 0811   BILITOT 0.6 09/04/2014 1358       RADIOGRAPHIC STUDIES: I have personally reviewed the radiological images as listed and agreed with the findings in the report. No results found.   ASSESSMENT & PLAN:  Carcinoma of upper-outer quadrant of left breast in female, estrogen receptor positive ([place]) Stage I Breast  cancer ER/PR/her 2 neu POS- currently on tamoxifen.  Tolerating well-extended antihormone therapy.  #Clinically stable however-given the bilateral hip pain/need to rule out any recurrent/metastatic disease.   #Epigastric discomfort/abdominal discomfort bloating-awaiting upper GI series with gastroenterology.  Recommend follow-up with GI for further evaluation.  # hot flashes/ vasomotor symptoms- stable.  # BMD- Normal Feb 2017; continue calcium and vitamin D.  This will be rescheduled.  # DISPOSITION:  # bone scan in 1 week- will call with results-  # BMD in 2-3 weeks.   # follow up in 6 months-MD /labs prior/cbc/cmp-Dr.B     Orders Placed This Encounter  Procedures  . NM Bone Scan Whole Body    Standing Status:   Future    Standing Expiration Date:   12/01/2019    Order Specific Question:   ** REASON FOR EXAM (FREE TEXT)    Answer:   bilateral hip pain/ hx of breast cancer    Order Specific Question:   If indicated for the ordered procedure, I authorize the administration of a radiopharmaceutical per Radiology protocol    Answer:   Yes    Order Specific Question:   Is the patient pregnant?    Answer:   Yes    Order Specific Question:   Preferred imaging location?    Answer:   [facility]    Order Specific Question:   Radiology Contrast Protocol - do NOT remove file path    Answer:   [email or web address]  . CBC with Differential    Standing Status:   Future    Standing Expiration Date:  12/01/2019  . Comprehensive metabolic panel    Standing Status:   Future    Standing Expiration Date:   12/01/2019   All questions were answered. The patient knows to call the clinic with any problems, questions or concerns.      Cammie Sickle, MD 12/01/2018 11:01 AM

## 2018-12-01 NOTE — Assessment & Plan Note (Addendum)
Stage I Breast cancer ER/PR/her 2 neu POS- currently on tamoxifen.  Tolerating well-extended antihormone therapy.  #Clinically stable however-given the bilateral hip pain/need to rule out any recurrent/metastatic disease.   #Epigastric discomfort/abdominal discomfort bloating-awaiting upper GI series with gastroenterology.  Recommend follow-up with GI for further evaluation.  # hot flashes/ vasomotor symptoms- stable.  # BMD- Normal Feb 2017; continue calcium and vitamin D.  This will be rescheduled.  # DISPOSITION:  # bone scan in 1 week- will call with results-  # BMD in 2-3 weeks.   # follow up in 6 months-MD /labs prior/cbc/cmp-Dr.B

## 2018-12-02 ENCOUNTER — Ambulatory Visit
Admission: RE | Admit: 2018-12-02 | Discharge: 2018-12-02 | Disposition: A | Discharge status: Home / Self Care | Payer: BC Managed Care – PPO | Source: Ambulatory Visit | Attending: Nurse Practitioner | Admitting: Nurse Practitioner

## 2018-12-02 DIAGNOSIS — K2100 Gastro-esophageal reflux disease with esophagitis, without bleeding: Secondary | ICD-10-CM

## 2018-12-03 ENCOUNTER — Other Ambulatory Visit: Payer: Self-pay

## 2018-12-03 ENCOUNTER — Ambulatory Visit
Admission: RE | Admit: 2018-12-03 | Discharge: 2018-12-03 | Disposition: A | Discharge status: Home / Self Care | Payer: BC Managed Care – PPO | Source: Ambulatory Visit | Attending: Nurse Practitioner | Admitting: Nurse Practitioner

## 2018-12-03 DIAGNOSIS — K219 Gastro-esophageal reflux disease without esophagitis: Secondary | ICD-10-CM | POA: Diagnosis not present

## 2018-12-03 DIAGNOSIS — K210 Gastro-esophageal reflux disease with esophagitis: Secondary | ICD-10-CM | POA: Diagnosis not present

## 2018-12-09 ENCOUNTER — Ambulatory Visit
Admission: RE | Admit: 2018-12-09 | Discharge: 2018-12-09 | Disposition: A | Discharge status: Home / Self Care | Payer: BC Managed Care – PPO | Source: Ambulatory Visit | Attending: Internal Medicine | Admitting: Internal Medicine

## 2018-12-09 ENCOUNTER — Encounter
Admission: RE | Admit: 2018-12-09 | Discharge: 2018-12-09 | Disposition: A | Discharge status: Home / Self Care | Payer: BC Managed Care – PPO | Source: Ambulatory Visit | Attending: Internal Medicine | Admitting: Internal Medicine

## 2018-12-09 ENCOUNTER — Other Ambulatory Visit: Payer: Self-pay

## 2018-12-09 DIAGNOSIS — Z170 Estrogen receptor positive status [ER+]: Secondary | ICD-10-CM | POA: Insufficient documentation

## 2018-12-09 DIAGNOSIS — M25552 Pain in left hip: Secondary | ICD-10-CM | POA: Diagnosis not present

## 2018-12-09 DIAGNOSIS — Z853 Personal history of malignant neoplasm of breast: Secondary | ICD-10-CM | POA: Diagnosis not present

## 2018-12-09 DIAGNOSIS — M25551 Pain in right hip: Secondary | ICD-10-CM | POA: Insufficient documentation

## 2018-12-09 DIAGNOSIS — C50412 Malignant neoplasm of upper-outer quadrant of left female breast: Secondary | ICD-10-CM

## 2018-12-09 MED ORDER — TECHNETIUM TC 99M MEDRONATE IV KIT
20.0000 | PACK | Freq: Once | INTRAVENOUS | Status: AC | PRN
  Administered 2018-12-09: 10:00:00 23.2 via INTRAVENOUS

## 2018-12-14 ENCOUNTER — Telehealth: Payer: Self-pay | Admitting: Gastroenterology

## 2018-12-14 NOTE — Telephone Encounter (Signed)
Patient called & l/m on v/m stating she has an appointment with DR Allen Norris on 01-26-19 @ 8:30am. She has received letter letting her know it was time for a colonoscopy. She wants to know does she need to wait & it will be set up in the appointment or schedule this before appointment. Please advise.

## 2018-12-14 NOTE — Telephone Encounter (Signed)
Pt notified she can wait until her appt to schedule procedure.

## 2018-12-15 ENCOUNTER — Telehealth: Payer: Self-pay | Admitting: Internal Medicine

## 2018-12-15 NOTE — Telephone Encounter (Signed)
I spoke to patient regarding the results of the bone scan negative for malignancy.  Patient has continued hip pain joint pains.  Recommend discontinue tamoxifen.  Will reevaluate in 1 month/and possibly start Aromasin.  C-Please schedule follow-up visit in 1 month with me/Doximity.  Thank you

## 2018-12-16 NOTE — Telephone Encounter (Signed)
msg sent to the scheduling team to arrange for dox visit in 1 month

## 2018-12-17 ENCOUNTER — Other Ambulatory Visit: Payer: BC Managed Care – PPO

## 2018-12-22 NOTE — Progress Notes (Signed)
Reviewed with patient

## 2018-12-23 ENCOUNTER — Ambulatory Visit: Payer: BLUE CROSS/BLUE SHIELD | Admitting: General Surgery

## 2019-01-19 ENCOUNTER — Ambulatory Visit: Payer: BC Managed Care – PPO | Admitting: Internal Medicine

## 2019-01-21 ENCOUNTER — Other Ambulatory Visit: Payer: Self-pay

## 2019-01-21 DIAGNOSIS — K2100 Gastro-esophageal reflux disease with esophagitis, without bleeding: Secondary | ICD-10-CM

## 2019-01-21 MED ORDER — PANTOPRAZOLE SODIUM 40 MG PO TBEC: 40.0000 mg | DELAYED_RELEASE_TABLET | Freq: Every day | ORAL | 3 refills | Status: DC

## 2019-01-24 ENCOUNTER — Other Ambulatory Visit: Payer: Self-pay

## 2019-01-24 ENCOUNTER — Ambulatory Visit
Admission: RE | Admit: 2019-01-24 | Discharge: 2019-01-24 | Disposition: A | Discharge status: Home / Self Care | Payer: BC Managed Care – PPO | Source: Ambulatory Visit | Attending: General Surgery | Admitting: General Surgery

## 2019-01-24 ENCOUNTER — Ambulatory Visit
Admission: RE | Admit: 2019-01-24 | Discharge: 2019-01-24 | Disposition: A | Discharge status: Home / Self Care | Payer: BC Managed Care – PPO | Source: Ambulatory Visit | Attending: Internal Medicine | Admitting: Internal Medicine

## 2019-01-24 DIAGNOSIS — Z1231 Encounter for screening mammogram for malignant neoplasm of breast: Secondary | ICD-10-CM

## 2019-01-24 DIAGNOSIS — Z79811 Long term (current) use of aromatase inhibitors: Secondary | ICD-10-CM | POA: Insufficient documentation

## 2019-01-24 DIAGNOSIS — Z780 Asymptomatic menopausal state: Secondary | ICD-10-CM | POA: Diagnosis not present

## 2019-01-24 DIAGNOSIS — Z13820 Encounter for screening for osteoporosis: Secondary | ICD-10-CM | POA: Diagnosis not present

## 2019-01-24 DIAGNOSIS — M159 Polyosteoarthritis, unspecified: Secondary | ICD-10-CM

## 2019-01-24 MED ORDER — ETODOLAC 400 MG PO TABS: ORAL_TABLET | ORAL | 2 refills | Status: DC

## 2019-01-25 ENCOUNTER — Ambulatory Visit: Payer: BLUE CROSS/BLUE SHIELD | Admitting: General Surgery

## 2019-01-26 ENCOUNTER — Ambulatory Visit (INDEPENDENT_AMBULATORY_CARE_PROVIDER_SITE_OTHER): Payer: BC Managed Care – PPO | Admitting: Gastroenterology

## 2019-01-26 ENCOUNTER — Ambulatory Visit: Payer: BLUE CROSS/BLUE SHIELD | Admitting: Surgery

## 2019-01-26 ENCOUNTER — Other Ambulatory Visit: Payer: Self-pay

## 2019-01-26 ENCOUNTER — Encounter: Payer: Self-pay | Admitting: Gastroenterology

## 2019-01-26 VITALS — BP 101/67 | HR 69 | Temp 96.8°F | Ht 66.0 in | Wt 205.0 lb

## 2019-01-26 DIAGNOSIS — K7460 Unspecified cirrhosis of liver: Secondary | ICD-10-CM

## 2019-01-26 DIAGNOSIS — K760 Fatty (change of) liver, not elsewhere classified: Secondary | ICD-10-CM | POA: Diagnosis not present

## 2019-01-26 NOTE — Patient Instructions (Signed)
You are scheduled for a RUQ abdominal US at [facility] outpatient imaging ([place]) on Wednesday, Sept 23rd at 10:15am. Please arrive at the registration desk at 10:00am. You cannot have anything to eat or drink after midnight on Tuesday night.  If you need to reschedule this appointment for any reason, please contact central scheduling at [phone].

## 2019-01-26 NOTE — Progress Notes (Signed)
Primary Care Physician: Lavera Guise, MD  Primary Gastroenterologist:  Dr. Lucilla Lame  Chief Complaint  Patient presents with   Follow up fatty liver    HPI: Danielle Wallace is a 58 y.o. female here for follow-up of her cirrhosis.  The patient was seen back in July of last year and has not followed up since.  The patient has a history of gastric varices and is being treated with a beta-blocker.  At the last visit the patient had her beta-blocker increased and had an ultrasound and alpha-fetoprotein ordered.  The ultrasound showed hepatic steatosis without any masses or lesions seen.  The patient reports that she has recently gained approximately 10 pounds.  She also states he feels much more bloated and has been feeling weak recently.  The patient's hemoglobin has been historically normal.  The patient also says that she sent off her DNA to [employer] and was told that she may have alpha 1 antitrypsin deficiency patient blood work for this showed her to be in the normal range although in the low normal. The patient denies any nausea vomiting fevers or chills.  Current Outpatient Medications  Medication Sig Dispense Refill   Calcium Carb-Cholecalciferol (CALCIUM + D3) 600-200 MG-UNIT TABS Take 1 tablet by mouth 2 (two) times daily. 180 tablet 3   diphenhydrAMINE (BENADRYL) 25 MG tablet Take 25 mg by mouth every 6 (six) hours as needed.     EPINEPHrine (EPIPEN 2-PAK) 0.3 mg/0.3 mL IJ SOAJ injection Inject 1 Units into the skin as needed.     etodolac (LODINE) 400 MG tablet TAKE 1 TABLET BY MOUTH  2 TIMES A DAY AS NEEDED FOR PAIN 60 tablet 2   fluticasone (FLONASE) 50 MCG/ACT nasal spray Place into both nostrils daily.     glipiZIDE (GLUCOTROL XL) 5 MG 24 hr tablet Take 2.5 mg by mouth daily with supper.     imipramine (TOFRANIL) 25 MG tablet TAKE 2 TABLETS (50 MG TOTAL) BY MOUTH AT BEDTIME. 180 tablet 2   metFORMIN (GLUCOPHAGE) 500 MG tablet Take 2,000 mg by mouth every  evening.      nadolol (CORGARD) 80 MG tablet Take 1 tablet (80 mg total) by mouth daily. **PLEASE SCHEDULE 6 MONTH FOLLOW UP APPT** 90 tablet 1   pantoprazole (PROTONIX) 40 MG tablet Take 1 tablet (40 mg total) by mouth daily. 30 tablet 3   PROAIR HFA 108 (90 Base) MCG/ACT inhaler 2 puffs 2 (two) times daily as needed.     tamoxifen (NOLVADEX) 20 MG tablet Take 1 tablet (20 mg total) by mouth daily. 90 tablet 3   valsartan-hydrochlorothiazide (DIOVAN-HCT) 80-12.5 MG tablet TAKE 1 TABLET BY MOUTH EVERY DAY 90 tablet 2   albuterol (PROVENTIL) (2.5 MG/3ML) 0.083% nebulizer solution Take 3 mLs (2.5 mg total) by nebulization every 6 (six) hours as needed for wheezing or shortness of breath. (Patient not taking: Reported on 12/01/2018) 360 mL 3   canagliflozin (INVOKANA) 300 MG TABS tablet TAKE 1 TABLET BY MOUTH EVERY DAY     chlorhexidine (PERIDEX) 0.12 % solution Use as directed 15 mLs in the mouth or throat 2 (two) times daily. (Patient not taking: Reported on 12/01/2018) 400 mL 2   No current facility-administered medications for this visit.     Allergies as of 01/26/2019 - Review Complete 01/26/2019  Allergen Reaction Noted   Exemestane Anaphylaxis 06/18/2015   Floxin [ofloxacin] Anaphylaxis 10/12/2013   Gluten meal Anaphylaxis 01/13/2017   Levofloxacin Anaphylaxis, Diarrhea, Itching, Nausea Only,  Other (See Comments), Palpitations, Shortness Of Breath, Swelling, and Tinitus    Linezolid Anaphylaxis 09/25/2014   Taxotere [docetaxel] Anaphylaxis 09/25/2014   Hydrocodone Itching 10/12/2013   Sulfa antibiotics Other (See Comments) 12/28/2012   Codeine Rash 08/09/2014   Penicillins Rash 10/12/2013   Vancomycin Rash 08/16/2014    ROS:  General: Negative for anorexia, weight loss, fever, chills, fatigue, weakness. ENT: Negative for hoarseness, difficulty swallowing , nasal congestion. CV: Negative for chest pain, angina, palpitations, dyspnea on exertion, peripheral edema.    Respiratory: Negative for dyspnea at rest, dyspnea on exertion, cough, sputum, wheezing.  GI: See history of present illness. GU:  Negative for dysuria, hematuria, urinary incontinence, urinary frequency, nocturnal urination.  Endo: Negative for unusual weight change.    Physical Examination:   BP 101/67    Pulse 69    Temp (!) 96.8 F (36 C) (Temporal)    Ht 5' 6"  (1.676 m)    Wt 205 lb (93 kg)    BMI 33.09 kg/m   General: Well-nourished, well-developed in no acute distress.  Eyes: No icterus. Conjunctivae pink. Mouth: Oropharyngeal mucosa moist and pink , no lesions erythema or exudate. Lungs: Clear to auscultation bilaterally. Non-labored. Heart: Regular rate and rhythm, no murmurs rubs or gallops.  Abdomen: Bowel sounds are normal, nontender, nondistended, no hepatosplenomegaly or masses, no abdominal bruits or hernia , no rebound or guarding.   Extremities: No lower extremity edema. No clubbing or deformities. Neuro: Alert and oriented x 3.  Grossly intact. Skin: Warm and dry, no jaundice.   Psych: Alert and cooperative, normal mood and affect.  Labs:    Imaging Studies: Dg Bone Density  Result Date: 01/24/2019 EXAM: DUAL X-RAY ABSORPTIOMETRY (DXA) FOR BONE MINERAL DENSITY IMPRESSION: Technologist: SCE PATIENT BIOGRAPHICAL: Name: Danielle, Wallace Patient ID: [id number] Birth Date: 25-Dec-1960 Height: 66.0 in. Gender: Female Exam Date: 01/24/2019 Weight: 204.4 lbs. Indications: Asthma, Caucasian, Diabetic, High Risk Meds, History of Breast Cancer, History of Chemo, Osteoarthritis, Postmenopausal Fractures: Treatments: Calcium, Glipizide, Invokana, Metformin, Vitamin D ASSESSMENT: The BMD measured at Femur Neck Right is 1.017 g/cm2 with a T-score of -0.1. This patient is considered normal according to [facility]) criteria. The scan quality is good. Site Region Measured Measured WHO Young Adult BMD Date       Age      Classification T-score AP Spine L1-L4 01/24/2019  59.2 Normal 1.0 1.321 g/cm2 AP Spine L1-L4 06/27/2015 55.7 Normal 0.7 1.280 g/cm2 DualFemur Neck Right 01/24/2019 59.2 Normal -0.1 1.017 g/cm2 DualFemur Neck Right 06/27/2015 55.7 Normal 0.1 1.049 g/cm2 DualFemur Total Mean 01/24/2019 59.2 Normal 0.9 1.120 g/cm2 DualFemur Total Mean 06/27/2015 55.7 Normal 1.0 1.130 g/cm2 World Health Organization [facility]) criteria for post-menopausal, Caucasian Women: Normal:       T-score at or above -1 SD Osteopenia:   T-score between -1 and -2.5 SD Osteoporosis: T-score at or below -2.5 SD RECOMMENDATIONS: 1. All patients should optimize calcium and vitamin D intake. 2. Consider FDA-approved medical therapies in postmenopausal women and men aged 40 years and older, based on the following: a. A hip or vertebral(clinical or morphometric) fracture b. T-score < -2.5 at the femoral neck or spine after appropriate evaluation to exclude secondary causes c. Low bone mass (T-score between -1.0 and -2.5 at the femoral neck or spine) and a 10-year probability of a hip fracture > 3% or a 10-year probability of a major osteoporosis-related fracture > 20% based on the [place]-adapted WHO algorithm d. Clinician judgment and/or patient preferences may  indicate treatment for people with 10-year fracture probabilities above or below these levels FOLLOW-UP: People with diagnosed cases of osteoporosis or at high risk for fracture should have regular bone mineral density tests. For patients eligible for Medicare, routine testing is allowed once every 2 years. The testing frequency can be increased to one year for patients who have rapidly progressing disease, those who are receiving or discontinuing medical therapy to restore bone mass, or have additional risk factors. I have reviewed this report, and agree with the above findings. [facility] Electronically Signed   By: Lowella Grip III M.D.   On: 01/24/2019 09:48   Mm 3d Screen Breast Uni Right  Result Date: 01/24/2019 CLINICAL DATA:   Screening. Prior malignant LEFT mastectomy in 2015. EXAM: DIGITAL SCREENING UNILATERAL RIGHT MAMMOGRAM WITH CAD AND TOMO COMPARISON:  Previous exam(s). ACR Breast Density Category c: The breast tissue is heterogeneously dense, which may obscure small masses. FINDINGS: The patient has had a left mastectomy. There are no findings suspicious for malignancy in the RIGHT breast. Images were processed with CAD. IMPRESSION: No mammographic evidence of malignancy. A result letter of this screening mammogram will be mailed directly to the patient. RECOMMENDATION: Screening mammogram in one year.  (Code:SM-R-40M) BI-RADS CATEGORY  1: Negative. Electronically Signed   By: Evangeline Dakin M.D.   On: 01/24/2019 16:20    Assessment and Plan:   Danielle Wallace is a 58 y.o. y/o female who comes in today with a history of fatty liver and gastric varices.  The patient's liver enzymes have been historically elevated but her hemoglobin and hematocrit have been normal.  The patient has recently gained some weight but report is not eating much.  The patient will have her blood at all for alpha-fetoprotein and she will be set up for an abdominal ultrasound to look for any possible causes of her abdominal bloating and distention such as ascites.  She will also have a CBC repeated to make sure she has not become anemic.  The patient has explained the plan and agrees with it.    Lucilla Lame, MD. Marval Regal   Note: This dictation was prepared with Dragon dictation along with smaller phrase technology. Any transcriptional errors that result from this process are unintentional.

## 2019-01-27 ENCOUNTER — Telehealth: Payer: Self-pay

## 2019-01-27 LAB — CBC WITH DIFFERENTIAL/PLATELET
Basophils Absolute: 0 10*3/uL (ref 0.0–0.2)
Basos: 1 %
EOS (ABSOLUTE): 0.1 10*3/uL (ref 0.0–0.4)
Eos: 2 %
Hematocrit: 43.1 % (ref 34.0–46.6)
Hemoglobin: 14.3 g/dL (ref 11.1–15.9)
Immature Grans (Abs): 0 10*3/uL (ref 0.0–0.1)
Immature Granulocytes: 0 %
Lymphocytes Absolute: 2.3 10*3/uL (ref 0.7–3.1)
Lymphs: 37 %
MCH: 29.8 pg (ref 26.6–33.0)
MCHC: 33.2 g/dL (ref 31.5–35.7)
MCV: 90 fL (ref 79–97)
Monocytes Absolute: 0.5 10*3/uL (ref 0.1–0.9)
Monocytes: 8 %
Neutrophils Absolute: 3.3 10*3/uL (ref 1.4–7.0)
Neutrophils: 52 %
Platelets: 199 10*3/uL (ref 150–450)
RBC: 4.8 x10E6/uL (ref 3.77–5.28)
RDW: 12.4 % (ref 11.7–15.4)
WBC: 6.3 10*3/uL (ref 3.4–10.8)

## 2019-01-27 LAB — AFP TUMOR MARKER: AFP, Serum, Tumor Marker: 4.3 ng/mL (ref 0.0–8.3)

## 2019-01-27 NOTE — Telephone Encounter (Signed)
-----   Message from Lucilla Lame, MD sent at 01/27/2019  8:12 AM EDT ----- Let the patient know that her tumor marker and her blood count was all normal.  We will wait for the ultrasound results to see if there is any cause for her abdominal distention.

## 2019-01-27 NOTE — Telephone Encounter (Signed)
Pt notified of lab results

## 2019-02-02 ENCOUNTER — Ambulatory Visit
Admission: RE | Admit: 2019-02-02 | Discharge: 2019-02-02 | Disposition: A | Discharge status: Home / Self Care | Payer: BC Managed Care – PPO | Source: Ambulatory Visit | Attending: Gastroenterology | Admitting: Gastroenterology

## 2019-02-02 ENCOUNTER — Other Ambulatory Visit: Payer: Self-pay

## 2019-02-02 DIAGNOSIS — K7460 Unspecified cirrhosis of liver: Secondary | ICD-10-CM

## 2019-02-03 ENCOUNTER — Telehealth: Payer: Self-pay

## 2019-02-03 NOTE — Telephone Encounter (Signed)
-----   Message from Lucilla Lame, MD sent at 02/03/2019  8:43 AM EDT ----- Let the patient know that her ultrasound did not show any masses or worrisome lesions but there was fatty liver seen.

## 2019-02-03 NOTE — Telephone Encounter (Signed)
MyChart result message sent to pt regarding recent US.

## 2019-02-17 ENCOUNTER — Other Ambulatory Visit: Payer: Self-pay | Admitting: Internal Medicine

## 2019-02-17 DIAGNOSIS — C50412 Malignant neoplasm of upper-outer quadrant of left female breast: Secondary | ICD-10-CM

## 2019-02-17 NOTE — Telephone Encounter (Signed)
Patient cancelled hre July and September appointment and has one scheduled for January 2021. Do you want this refill?

## 2019-02-18 NOTE — Telephone Encounter (Signed)
Ok to RF per Dr. Rogue Bussing. Md would like pt to keep apts in jan. 2021 as scheduled.

## 2019-03-07 ENCOUNTER — Ambulatory Visit: Payer: BLUE CROSS/BLUE SHIELD | Admitting: Surgery

## 2019-03-20 DIAGNOSIS — R0602 Shortness of breath: Secondary | ICD-10-CM | POA: Diagnosis not present

## 2019-04-26 ENCOUNTER — Other Ambulatory Visit: Payer: Self-pay

## 2019-04-26 DIAGNOSIS — M159 Polyosteoarthritis, unspecified: Secondary | ICD-10-CM

## 2019-04-26 MED ORDER — ETODOLAC 400 MG PO TABS: ORAL_TABLET | ORAL | 1 refills | Status: DC

## 2019-05-24 ENCOUNTER — Other Ambulatory Visit: Payer: Self-pay | Admitting: Gastroenterology

## 2019-05-24 ENCOUNTER — Other Ambulatory Visit: Payer: Self-pay

## 2019-05-24 MED ORDER — VALSARTAN-HYDROCHLOROTHIAZIDE 80-12.5 MG PO TABS: 1.0000 | ORAL_TABLET | Freq: Every day | ORAL | 2 refills | Status: DC

## 2019-06-01 ENCOUNTER — Inpatient Hospital Stay: Payer: BC Managed Care – PPO

## 2019-06-01 ENCOUNTER — Inpatient Hospital Stay: Payer: BC Managed Care – PPO | Admitting: Internal Medicine

## 2019-06-23 ENCOUNTER — Telehealth: Payer: Self-pay

## 2019-06-23 ENCOUNTER — Other Ambulatory Visit: Payer: Self-pay

## 2019-06-23 DIAGNOSIS — M159 Polyosteoarthritis, unspecified: Secondary | ICD-10-CM

## 2019-06-23 MED ORDER — ETODOLAC 400 MG PO TABS: ORAL_TABLET | ORAL | 0 refills | Status: DC

## 2019-06-23 NOTE — Telephone Encounter (Signed)
Confirmed appointment on 06/27/2019 and screened for covid. klh

## 2019-06-27 ENCOUNTER — Ambulatory Visit (INDEPENDENT_AMBULATORY_CARE_PROVIDER_SITE_OTHER): Payer: BC Managed Care – PPO | Admitting: Nurse Practitioner

## 2019-06-27 ENCOUNTER — Other Ambulatory Visit: Payer: Self-pay

## 2019-06-27 ENCOUNTER — Encounter: Payer: Self-pay | Admitting: Nurse Practitioner

## 2019-06-27 VITALS — BP 127/90 | HR 77 | Temp 97.1°F | Resp 16 | Ht 66.0 in | Wt 206.8 lb

## 2019-06-27 DIAGNOSIS — I10 Essential (primary) hypertension: Secondary | ICD-10-CM | POA: Diagnosis not present

## 2019-06-27 DIAGNOSIS — K2100 Gastro-esophageal reflux disease with esophagitis, without bleeding: Secondary | ICD-10-CM | POA: Diagnosis not present

## 2019-06-27 DIAGNOSIS — T7840XD Allergy, unspecified, subsequent encounter: Secondary | ICD-10-CM

## 2019-06-27 DIAGNOSIS — E1165 Type 2 diabetes mellitus with hyperglycemia: Secondary | ICD-10-CM

## 2019-06-27 LAB — POCT GLYCOSYLATED HEMOGLOBIN (HGB A1C): Hemoglobin A1C: 6.6 % — AB (ref 4.0–5.6)

## 2019-06-27 MED ORDER — EPINEPHRINE 0.3 MG/0.3ML IJ SOAJ: 0.3000 mg | INTRAMUSCULAR | 1 refills | Status: DC | PRN

## 2019-06-27 MED ORDER — PANTOPRAZOLE SODIUM 40 MG PO TBEC: 40.0000 mg | DELAYED_RELEASE_TABLET | Freq: Every day | ORAL | 3 refills | Status: DC

## 2019-06-27 NOTE — Progress Notes (Signed)
[facility] [place], [place] [address]  Internal MEDICINE  Office Visit Note  Patient Name: Danielle Wallace  [id number]  [id number]  Date of Service: 07/01/2019  Chief Complaint  Patient presents with  . Hypertension  . Diabetes  . Allergies    more frequent, random   . Back Pain    lower back pain   . Fatigue  . Numbness    in knee and and thigh on right leg, sometimes on left knee   . Medication Refill    pantoprazole    The patient is here for routine follow up. She states that she is having some more severe allergic reactions than normal. She had an anaphylactic reaction to sauerkraut. She has also noted the some odors and fragrances make her feel like her throat is going to close up and she cannot catch her breath. She states that at night, she has noted increased wheezing and cough. She has had scratch test allergy testing in the past. No significant allergies were noted. She used to take non-drowsy antihistamine and singulair. Could not tell that these were helping at all.  Overall, blood sugars are doing well. HgbA1c is 6.6 today, down from 6.8 at her last check. She has had some ups and downs with her blood sugars, bot overall, control is good. Blood pressure is well managed.       Current Medication: Outpatient Encounter Medications as of 06/27/2019  Medication Sig Note  . albuterol (PROVENTIL) (2.5 MG/3ML) 0.083% nebulizer solution Take 3 mLs (2.5 mg total) by nebulization every 6 (six) hours as needed for wheezing or shortness of breath.   . B COMPLEX-C PO Take by mouth daily.   . Calcium Carb-Cholecalciferol (CALCIUM + D3) 600-200 MG-UNIT TABS Take 1 tablet by mouth 2 (two) times daily.   . canagliflozin (INVOKANA) 300 MG TABS tablet TAKE 1 TABLET BY MOUTH EVERY DAY 11/29/2014: Received from: [facility]  . diphenhydrAMINE (BENADRYL) 25 MG tablet Take 25 mg by mouth every 6 (six) hours as needed.   Marland Kitchen EPINEPHrine (EPIPEN  2-PAK) 0.3 mg/0.3 mL IJ SOAJ injection Inject 0.3 mLs (0.3 mg total) into the skin as needed.   . etodolac (LODINE) 400 MG tablet TAKE 1 TABLET BY MOUTH  2 TIMES A DAY AS NEEDED FOR PAIN   . fluticasone (FLONASE) 50 MCG/ACT nasal spray Place into both nostrils daily.   Marland Kitchen glipiZIDE (GLUCOTROL XL) 5 MG 24 hr tablet Take 2.5 mg by mouth daily with supper.   Marland Kitchen imipramine (TOFRANIL) 25 MG tablet TAKE 2 TABLETS (50 MG TOTAL) BY MOUTH AT BEDTIME.   . metFORMIN (GLUCOPHAGE) 500 MG tablet Take 2,000 mg by mouth every evening.    . nadolol (CORGARD) 80 MG tablet TAKE 1 TABLET (80 MG TOTAL) BY MOUTH DAILY. **PLEASE SCHEDULE 6 MONTH FOLLOW UP APPT**   . Omega-3 Fatty Acids (FISH OIL OMEGA-3 PO) Take by mouth daily.   . pantoprazole (PROTONIX) 40 MG tablet Take 1 tablet (40 mg total) by mouth daily.   Marland Kitchen PROAIR HFA 108 (90 Base) MCG/ACT inhaler 2 puffs 2 (two) times daily as needed.   . tamoxifen (NOLVADEX) 20 MG tablet TAKE 1 TABLET BY MOUTH EVERY DAY   . valsartan-hydrochlorothiazide (DIOVAN-HCT) 80-12.5 MG tablet Take 1 tablet by mouth daily.   Marland Kitchen VITAMIN E PO Take by mouth daily.   . [DISCONTINUED] EPINEPHrine (EPIPEN 2-PAK) 0.3 mg/0.3 mL IJ SOAJ injection Inject 1 Units into the skin as needed.   . [  DISCONTINUED] pantoprazole (PROTONIX) 40 MG tablet Take 1 tablet (40 mg total) by mouth daily.   . [DISCONTINUED] chlorhexidine (PERIDEX) 0.12 % solution Use as directed 15 mLs in the mouth or throat 2 (two) times daily. (Patient not taking: Reported on 06/27/2019)    No facility-administered encounter medications on file as of 06/27/2019.    Surgical History: Past Surgical History:  Procedure Laterality Date  . BREAST BIOPSY Right 2005   negative  . BREAST BIOPSY Left 08/23/2007   negative  . BREAST BIOPSY Left 10/24/2013   positive  . BREAST BIOPSY Left 07/2002   neg  . BREAST BIOPSY Left 08/23/2007   neg  . BREAST SURGERY Left 10/2011   Cyst Aspirationapocrine metaplasia, ductal cells and bone cells,  hypo-cellular  . BREAST SURGERY Left 2009   ADH on stereotactic biopsy, 1.5 mm focus.  Marland Kitchen BREAST SURGERY Left 2003   fibrocystic changes with ductal hyperplasia without atypia.  Marland Kitchen BREAST SURGERY Left    mastectomy  . BREAST SURGERY Left April 11, 2014   Removal of implant, debridement chest wall Dr.Coan  . CESAREAN SECTION  1991  . CHOLECYSTECTOMY  1990  . COLONOSCOPY  08/26/2013   Verdie Shire, M.D. normal.  . CRYOABLATION  2005, 2010  . CYST REMOVAL NECK  10/2011   Dr. Tami Ribas  . ESOPHAGOGASTRODUODENOSCOPY (EGD) WITH PROPOFOL N/A 01/16/2017   Procedure: ESOPHAGOGASTRODUODENOSCOPY (EGD) WITH PROPOFOL;  Surgeon: Lucilla Lame, MD;  Location: [facility];  Service: Gastroenterology;  Laterality: N/A;  Diabetic - oral meds  . INCISION AND DRAINAGE Left Dec 2015, Feb 2016   Dr Tula Nakayama  . MASTECTOMY Left 11/24/2013   positive  . PORTACATH PLACEMENT  11/24/13    Medical History: Past Medical History:  Diagnosis Date  . Arthritis 2006  . Asthma   . Breast cancer ([place]) 10/14/2013   18 mm, T1c, N0; ER/ PR positive,her 2 neu overexpressed. Adjuvant chemo/ herceptin.  . Cancer [facility]) 2006   Renal cell carcinoma; cryosurgery treatment, right side  . Cirrhosis ([place])   . Collapsed lung 2007  . Diabetes mellitus without complication ([place]) 2500   Metformin  . Diffuse cystic mastopathy   . Motion sickness    any moving vehicle  . Orthodontics    braces  . Personal history of chemotherapy   . Sinus problem     Family History: Family History  Problem Relation Age of Onset  . Liver cancer Father   . Hemochromatosis Father   . Liver disease Father   . Diabetes Mother   . Hypertension Mother   . Breast cancer Paternal Aunt 61  . Ovarian cancer Maternal Grandmother   . Diabetes Sister   . Hemachromatosis Daughter     Social History   Socioeconomic History  . Marital status: Married    Spouse name: Not on file  . Number of children: Not on file  . Years of education: Not on file   . Highest education level: Not on file  Occupational History  . Not on file  Tobacco Use  . Smoking status: Never Smoker  . Smokeless tobacco: Never Used  Substance and Sexual Activity  . Alcohol use: Not Currently    Comment: occasionally, none recently  . Drug use: No  . Sexual activity: Not on file  Other Topics Concern  . Not on file  Social History Narrative  . Not on file   Social Determinants of Health   Financial Resource Strain:   . Difficulty of Paying Living  Expenses: Not on file  Food Insecurity:   . Worried About Charity fundraiser in the Last Year: Not on file  . Ran Out of Food in the Last Year: Not on file  Transportation Needs:   . Lack of Transportation (Medical): Not on file  . Lack of Transportation (Non-Medical): Not on file  Physical Activity:   . Days of Exercise per Week: Not on file  . Minutes of Exercise per Session: Not on file  Stress:   . Feeling of Stress : Not on file  Social Connections:   . Frequency of Communication with Friends and Family: Not on file  . Frequency of Social Gatherings with Friends and Family: Not on file  . Attends Religious Services: Not on file  . Active Member of Clubs or Organizations: Not on file  . Attends Archivist Meetings: Not on file  . Marital Status: Not on file  Intimate Partner Violence:   . Fear of Current or Ex-Partner: Not on file  . Emotionally Abused: Not on file  . Physically Abused: Not on file  . Sexually Abused: Not on file      Review of Systems  Constitutional: Positive for fatigue. Negative for chills and unexpected weight change.  HENT: Positive for congestion and postnasal drip. Negative for rhinorrhea, sneezing and sore throat.        Chronic allergy symptoms. New allergic symptoms related to eating.  Respiratory: Negative for cough, chest tightness, shortness of breath and wheezing.   Cardiovascular: Negative for chest pain and palpitations.  Gastrointestinal: Negative  for abdominal pain, constipation, diarrhea, nausea and vomiting.  Endocrine: Negative for cold intolerance, heat intolerance, polydipsia and polyuria.       Blood sugars doing well   Musculoskeletal: Negative for arthralgias, back pain, joint swelling and neck pain.  Skin: Negative for rash.  Allergic/Immunologic: Positive for environmental allergies and food allergies.  Neurological: Positive for dizziness and headaches. Negative for tremors and numbness.  Hematological: Negative for adenopathy. Does not bruise/bleed easily.  Psychiatric/Behavioral: Negative for behavioral problems (Depression), sleep disturbance and suicidal ideas. The patient is not nervous/anxious.     Today's Vitals   06/27/19 1536  BP: 127/90  Pulse: 77  Resp: 16  Temp: (!) 97.1 F (36.2 C)  SpO2: 99%  Weight: 206 lb 12.8 oz (93.8 kg)  Height: 5' 6"  (1.676 m)   Body mass index is 33.38 kg/m.  Physical Exam Vitals and nursing note reviewed.  Constitutional:      General: She is not in acute distress.    Appearance: Normal appearance. She is well-developed. She is not diaphoretic.  HENT:     Head: Normocephalic and atraumatic.     Nose: Nose normal.     Mouth/Throat:     Pharynx: No oropharyngeal exudate.  Eyes:     Conjunctiva/sclera: Conjunctivae normal.     Pupils: Pupils are equal, round, and reactive to light.  Neck:     Thyroid: No thyromegaly.     Vascular: No carotid bruit or JVD.     Trachea: No tracheal deviation.  Cardiovascular:     Rate and Rhythm: Normal rate and regular rhythm.     Heart sounds: Normal heart sounds. No murmur. No friction rub. No gallop.   Pulmonary:     Effort: Pulmonary effort is normal. No respiratory distress.     Breath sounds: Normal breath sounds. No wheezing or rales.  Chest:     Chest wall: No tenderness.  Abdominal:  Palpations: Abdomen is soft.  Musculoskeletal:        General: Normal range of motion.     Cervical back: Normal range of motion and  neck supple.  Lymphadenopathy:     Cervical: No cervical adenopathy.  Skin:    General: Skin is warm and dry.  Neurological:     Mental Status: She is alert and oriented to person, place, and time.     Cranial Nerves: No cranial nerve deficit.  Psychiatric:        Mood and Affect: Mood normal.        Behavior: Behavior normal.        Thought Content: Thought content normal.        Judgment: Judgment normal.    Assessment/Plan: 1. Type 2 diabetes mellitus with hyperglycemia, without long-term current use of insulin (HCC) - POCT HgB A1C 6.6 today. Continue diabetic medication as prescribed  2. Allergic reaction, subsequent encounter New prescription for epipen given. Use as needed and as prescribed. Reviewed need for ER treatment if she required use of epipen. Will check serum allergy panels for food, vegetables, nuts, and environmental ellergies specific to this geographic area. Discuss treatment after labs are back.  - EPINEPHrine (EPIPEN 2-PAK) 0.3 mg/0.3 mL IJ SOAJ injection; Inject 0.3 mLs (0.3 mg total) into the skin as needed.  Dispense: 1 each; Refill: 1  3. Gastroesophageal reflux disease with esophagitis Continue protonix as prescribed  - pantoprazole (PROTONIX) 40 MG tablet; Take 1 tablet (40 mg total) by mouth daily.  Dispense: 90 tablet; Refill: 3  4. Essential hypertension Stable. Continue bp medication as prescribed   General Counseling: ramon brant understanding of the findings of todays visit and agrees with plan of treatment. I have discussed any further diagnostic evaluation that may be needed or ordered today. We also reviewed her medications today. she has been encouraged to call the office with any questions or concerns that should arise related to todays visit.  Diabetes Counseling:  1. Addition of ACE inh/ ARB'S for nephroprotection. Microalbumin is updated  2. Diabetic foot care, prevention of complications. Podiatry consult 3. Exercise and lose  weight.  4. Diabetic eye examination, Diabetic eye exam is updated  5. Monitor blood sugar closlely. nutrition counseling.  6. Sign and symptoms of hypoglycemia including shaking sweating,confusion and headaches.   This patient was seen by Leretha Pol FNP Collaboration with Dr Lavera Guise as a part of collaborative care agreement   Orders Placed This Encounter  Procedures  . POCT HgB A1C    Meds ordered this encounter  Medications  . EPINEPHrine (EPIPEN 2-PAK) 0.3 mg/0.3 mL IJ SOAJ injection    Sig: Inject 0.3 mLs (0.3 mg total) into the skin as needed.    Dispense:  1 each    Refill:  1    Order Specific Question:   Supervising Provider    Answer:   Lavera Guise [[id number]]  . pantoprazole (PROTONIX) 40 MG tablet    Sig: Take 1 tablet (40 mg total) by mouth daily.    Dispense:  90 tablet    Refill:  3    Please d/c omeprazole    Order Specific Question:   Supervising Provider    Answer:   Lavera Guise [[id number]]    Total time spent: 68 Minutes   Time spent includes review of chart, medications, test results, and follow up plan with the patient.      Dr Lavera Guise Internal medicine

## 2019-06-30 ENCOUNTER — Inpatient Hospital Stay: Payer: BC Managed Care – PPO

## 2019-06-30 ENCOUNTER — Ambulatory Visit: Payer: BC Managed Care – PPO | Admitting: Internal Medicine

## 2019-07-01 ENCOUNTER — Inpatient Hospital Stay: Payer: BC Managed Care – PPO | Admitting: Internal Medicine

## 2019-07-01 DIAGNOSIS — T7840XA Allergy, unspecified, initial encounter: Secondary | ICD-10-CM | POA: Insufficient documentation

## 2019-07-07 ENCOUNTER — Other Ambulatory Visit: Payer: BC Managed Care – PPO

## 2019-07-08 ENCOUNTER — Telehealth: Payer: BC Managed Care – PPO | Admitting: Internal Medicine

## 2019-07-13 ENCOUNTER — Other Ambulatory Visit: Payer: Self-pay | Admitting: Nurse Practitioner

## 2019-07-13 DIAGNOSIS — T7840XD Allergy, unspecified, subsequent encounter: Secondary | ICD-10-CM | POA: Diagnosis not present

## 2019-07-15 ENCOUNTER — Encounter: Payer: Self-pay | Admitting: Nurse Practitioner

## 2019-07-15 LAB — ALLERGEN PROFILE, VEGETABLE II
Allergen Carrot IgE: <0.1 kU/L
Allergen Green Bean IgE: <0.1 kU/L
Allergen Green Pea IgE: <0.1 kU/L
Allergen Onion IgE: <0.1 kU/L
Allergen Potato, White IgE: <0.1 kU/L
Allergen Tomato, IgE: <0.1 kU/L
Kidney Bean IgE: <0.1 kU/L
Pumpkin IgE: <0.1 kU/L

## 2019-07-15 LAB — ALLERGENS W/COMP RFLX AREA 2
Alternaria Alternata IgE: <0.1 kU/L
Aspergillus Fumigatus IgE: <0.1 kU/L
Bermuda Grass IgE: <0.1 kU/L
Cedar, Mountain IgE: <0.1 kU/L
Cladosporium Herbarum IgE: <0.1 kU/L
Cockroach, German IgE: <0.1 kU/L
Common Silver Birch IgE: <0.1 kU/L
Cottonwood IgE: <0.1 kU/L
D Farinae IgE: <0.1 kU/L
D Pteronyssinus IgE: <0.1 kU/L
E001-IgE Cat Dander: <0.1 kU/L
E005-IgE Dog Dander: <0.1 kU/L
Elm, American IgE: <0.1 kU/L
IgE (Immunoglobulin E), Serum: 21 IU/mL (ref 6–495)
Johnson Grass IgE: <0.1 kU/L
Maple/Box Elder IgE: <0.1 kU/L
Mouse Urine IgE: <0.1 kU/L
Oak, White IgE: <0.1 kU/L
Pecan, Hickory IgE: <0.1 kU/L
Penicillium Chrysogen IgE: <0.1 kU/L
Pigweed, Rough IgE: <0.1 kU/L
Ragweed, Short IgE: <0.1 kU/L
Sheep Sorrel IgE Qn: <0.1 kU/L
Timothy Grass IgE: <0.1 kU/L
White Mulberry IgE: <0.1 kU/L

## 2019-07-15 LAB — FOOD ALLERGY PROFILE
Allergen Corn, IgE: <0.1 kU/L
Clam IgE: <0.1 kU/L
Codfish IgE: <0.1 kU/L
Egg White IgE: <0.1 kU/L
Milk IgE: <0.1 kU/L
Peanut IgE: <0.1 kU/L
Scallop IgE: <0.1 kU/L
Sesame Seed IgE: 0.11 kU/L — AB
Shrimp IgE: <0.1 kU/L
Soybean IgE: <0.1 kU/L
Walnut IgE: <0.1 kU/L
Wheat IgE: <0.1 kU/L

## 2019-07-15 LAB — ALLERGEN PROFILE, VEGETABLE I
Allergen Broccoli: <0.1 kU/L
Allergen Cabbage IgE: <0.1 kU/L
Allergen Cauliflower IgE: <0.1 kU/L
Allergen Celery IgE: <0.1 kU/L
Allergen Cucumber IgE: <0.1 kU/L
Allergen Lettuce IgE: <0.1 kU/L
F214-IgE Spinach: <0.1 kU/L

## 2019-07-15 LAB — ALLERGENS(7)
Brazil Nut IgE: <0.1 kU/L
F020-IgE Almond: <0.1 kU/L
F202-IgE Cashew Nut: <0.1 kU/L
Hazelnut (Filbert) IgE: <0.1 kU/L
Pecan Nut IgE: <0.1 kU/L

## 2019-07-18 NOTE — Telephone Encounter (Signed)
Probably easiest to go ahead and schedule appointment to discuss treatment. Just not this afternoon. Already ten follow ups.

## 2019-07-20 NOTE — Progress Notes (Signed)
Positive mild allergy to sesame seed

## 2019-07-21 ENCOUNTER — Other Ambulatory Visit: Payer: Self-pay

## 2019-07-21 DIAGNOSIS — M159 Polyosteoarthritis, unspecified: Secondary | ICD-10-CM

## 2019-07-21 MED ORDER — ETODOLAC 400 MG PO TABS: ORAL_TABLET | ORAL | 1 refills | Status: DC

## 2019-07-22 DIAGNOSIS — Z23 Encounter for immunization: Secondary | ICD-10-CM | POA: Diagnosis not present

## 2019-07-29 ENCOUNTER — Telehealth: Payer: Self-pay

## 2019-07-29 NOTE — Telephone Encounter (Signed)
Error pt make appt for labs drawn other message for error

## 2019-07-29 NOTE — Telephone Encounter (Signed)
Can you find out if it's right or left shoulder and is it her neck?

## 2019-07-29 NOTE — Telephone Encounter (Signed)
Error she is ok not for her I just

## 2019-08-04 ENCOUNTER — Inpatient Hospital Stay: Payer: BC Managed Care – PPO | Attending: Internal Medicine

## 2019-08-04 DIAGNOSIS — C50412 Malignant neoplasm of upper-outer quadrant of left female breast: Secondary | ICD-10-CM | POA: Diagnosis not present

## 2019-08-04 DIAGNOSIS — Z79810 Long term (current) use of selective estrogen receptor modulators (SERMs): Secondary | ICD-10-CM | POA: Diagnosis not present

## 2019-08-04 DIAGNOSIS — R232 Flushing: Secondary | ICD-10-CM | POA: Insufficient documentation

## 2019-08-04 DIAGNOSIS — M1990 Unspecified osteoarthritis, unspecified site: Secondary | ICD-10-CM | POA: Diagnosis not present

## 2019-08-04 DIAGNOSIS — Z170 Estrogen receptor positive status [ER+]: Secondary | ICD-10-CM | POA: Diagnosis not present

## 2019-08-04 DIAGNOSIS — Z9012 Acquired absence of left breast and nipple: Secondary | ICD-10-CM | POA: Diagnosis not present

## 2019-08-04 DIAGNOSIS — Z79899 Other long term (current) drug therapy: Secondary | ICD-10-CM | POA: Insufficient documentation

## 2019-08-04 LAB — CBC WITH DIFFERENTIAL/PLATELET
Abs Immature Granulocytes: 0.03 10*3/uL (ref 0.00–0.07)
Basophils Absolute: 0 10*3/uL (ref 0.0–0.1)
Basophils Relative: 0 %
Eosinophils Absolute: 0.2 10*3/uL (ref 0.0–0.5)
Eosinophils Relative: 2 %
HCT: 44.4 % (ref 36.0–46.0)
Hemoglobin: 14.9 g/dL (ref 12.0–15.0)
Immature Granulocytes: 0 %
Lymphocytes Relative: 37 %
Lymphs Abs: 3.1 10*3/uL (ref 0.7–4.0)
MCH: 30.4 pg (ref 26.0–34.0)
MCHC: 33.6 g/dL (ref 30.0–36.0)
MCV: 90.6 fL (ref 80.0–100.0)
Monocytes Absolute: 0.6 10*3/uL (ref 0.1–1.0)
Monocytes Relative: 7 %
Neutro Abs: 4.3 10*3/uL (ref 1.7–7.7)
Neutrophils Relative %: 54 %
Platelets: 217 10*3/uL (ref 150–400)
RBC: 4.9 MIL/uL (ref 3.87–5.11)
RDW: 12.5 % (ref 11.5–15.5)
WBC: 8.3 10*3/uL (ref 4.0–10.5)
nRBC: 0 % (ref 0.0–0.2)

## 2019-08-04 LAB — COMPREHENSIVE METABOLIC PANEL
ALT: 50 U/L — ABNORMAL HIGH (ref 0–44)
AST: 45 U/L — ABNORMAL HIGH (ref 15–41)
Albumin: 3.9 g/dL (ref 3.5–5.0)
Alkaline Phosphatase: 104 U/L (ref 38–126)
Anion gap: 11 (ref 5–15)
BUN: 25 mg/dL — ABNORMAL HIGH (ref 6–20)
CO2: 27 mmol/L (ref 22–32)
Calcium: 9.1 mg/dL (ref 8.9–10.3)
Chloride: 100 mmol/L (ref 98–111)
Creatinine, Ser: 0.71 mg/dL (ref 0.44–1.00)
GFR calc Af Amer: >60 mL/min (ref >60)
GFR calc non Af Amer: >60 mL/min (ref >60)
Glucose, Bld: 175 mg/dL — ABNORMAL HIGH (ref 70–99)
Potassium: 4 mmol/L (ref 3.5–5.1)
Sodium: 138 mmol/L (ref 135–145)
Total Bilirubin: 0.5 mg/dL (ref 0.3–1.2)
Total Protein: 7.2 g/dL (ref 6.5–8.1)

## 2019-08-05 ENCOUNTER — Inpatient Hospital Stay (HOSPITAL_BASED_OUTPATIENT_CLINIC_OR_DEPARTMENT_OTHER): Payer: BC Managed Care – PPO | Admitting: Internal Medicine

## 2019-08-05 DIAGNOSIS — Z170 Estrogen receptor positive status [ER+]: Secondary | ICD-10-CM | POA: Diagnosis not present

## 2019-08-05 DIAGNOSIS — Z79810 Long term (current) use of selective estrogen receptor modulators (SERMs): Secondary | ICD-10-CM | POA: Diagnosis not present

## 2019-08-05 DIAGNOSIS — C50412 Malignant neoplasm of upper-outer quadrant of left female breast: Secondary | ICD-10-CM

## 2019-08-05 DIAGNOSIS — R232 Flushing: Secondary | ICD-10-CM | POA: Diagnosis not present

## 2019-08-05 DIAGNOSIS — Z9012 Acquired absence of left breast and nipple: Secondary | ICD-10-CM | POA: Diagnosis not present

## 2019-08-05 DIAGNOSIS — Z79899 Other long term (current) drug therapy: Secondary | ICD-10-CM | POA: Diagnosis not present

## 2019-08-05 DIAGNOSIS — M1990 Unspecified osteoarthritis, unspecified site: Secondary | ICD-10-CM | POA: Diagnosis not present

## 2019-08-05 NOTE — Assessment & Plan Note (Addendum)
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  Tolerating poorly-because of worsening arthritis-see below.  Clinically no evidence of recurrence noted-stable.  #Arthritis-worsening; recommend holding tamoxifen for the next 3 months.  Will reevaluate at next visit.  Can consider low-dose of tamoxifen.  # hot flashes/ vasomotor symptoms- STABLE.   # BMD- [01/2019-] continue calcium and vitamin D; T score=-1.   # DISPOSITION:  # follow up in 3 months- MD; no labs--Dr.B

## 2019-08-11 NOTE — Progress Notes (Signed)
I connected with Danielle Wallace on 08/05/2019 at  3:30 PM EDT by video enabled telemedicine visit and verified that I am speaking with the correct person using two identifiers.  I discussed the limitations, risks, security and privacy concerns of performing an evaluation and management service by telemedicine and the availability of in-person appointments. I also discussed with the patient that there may be a patient responsible charge related to this service. The patient expressed understanding and agreed to proceed.    Other persons participating in the visit and their role in the encounter: RN/medical reconciliation Patient's location:home Provider's location: office  Oncology History Overview Note  # July 2016- 1.carcinoma of breast(left) T1 C. N0 M0 status post ultrasound-guided biopsy Estrogen receptor positive. Progesterone receptor positive.  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 chemotherapy on hold since November because of cellulitis in the chest wall. 4.started on Herceptin May 22, 2014.  Cause of recurrent chest wall  infection chemotherapy was put on hold. 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  Patient is on multiple antibiotic and as per infectious disease specialist similar to stay on antibiotic for 4 months. So chemotherapy has been discontinued. Maintenance Herceptin as well as patient was started on letrozole from June 12, 2014 6.  Port was removed because of Pseudomonas infection of the pocket (August, 2016) 7.  Patient is finishing up Herceptin in August of 2016 now on letrozole;   # FEB 2017- TAMOXIFEN [poor tolerance to AI]   # Jan 2019- [facility]-  intermediate risk- recommend EXTENDED TAM  # Cirrhosis/ ? Etiology; gastric varices [Dr.Wohl]  # Hemochromatosis FHx ---------------------------------------------------------------   DIAGNOSIS: BREAST CA; Triple positive  STAGE:  I       ;GOALS: curative  CURRENT/MOST RECENT THERAPY: Tamoxifen    Carcinoma of upper-outer quadrant of left breast in female, estrogen receptor positive ([place])     Chief Complaint: Breast cancer   History of present illness:Danielle Wallace 59 y.o.  female with history of stage I ER/PR positive HER-2 HER-2 positive breast cancer continue tamoxifen extended therapy is here for follow-up.  Patient notes to have worsening hip pain back pain.  Work-up included a bone scan-last year was negative.  Complains of fatigue.   Denies any vaginal discharge.  Denies any worsening hot flashes.  Observation/objective:  Assessment and plan: Carcinoma of upper-outer quadrant of left breast in female, estrogen receptor positive (Danielle Wallace) Stage I Breast cancer ER/PR/her 2 neu POS- currently on tamoxifen on extended therapy.  Tolerating poorly-because of worsening arthritis-see below.  Clinically no evidence of recurrence noted-stable.  #Arthritis-worsening; recommend holding tamoxifen for the next 3 months.  Will reevaluate at next visit.  Can consider low-dose of tamoxifen.  # hot flashes/ vasomotor symptoms- STABLE.   # BMD- [01/2019-] continue calcium and vitamin D; T score=-1.   # DISPOSITION:  # follow up in 3 months- MD; no labs--Dr.B  Follow-up instructions:  I discussed the assessment and treatment plan with the patient.  The patient was provided an opportunity to ask questions and all were answered.  The patient agreed with the plan and demonstrated understanding of instructions.  The patient was advised to call back or seek an in person evaluation if the  symptoms worsen or if the condition fails to improve as anticipated.  Dr. Charlaine Dalton [facility] at  [facility] 08/11/2019 8:10 AM

## 2019-08-15 ENCOUNTER — Ambulatory Visit: Payer: BC Managed Care – PPO | Admitting: Nurse Practitioner

## 2019-08-22 DIAGNOSIS — Z23 Encounter for immunization: Secondary | ICD-10-CM | POA: Diagnosis not present

## 2019-08-24 ENCOUNTER — Other Ambulatory Visit: Payer: Self-pay | Admitting: Internal Medicine

## 2019-08-24 DIAGNOSIS — K580 Irritable bowel syndrome with diarrhea: Secondary | ICD-10-CM

## 2019-08-30 ENCOUNTER — Telehealth: Payer: Self-pay

## 2019-08-30 NOTE — Telephone Encounter (Signed)
Confirmed and screened for 09-01-19 ov. 

## 2019-08-30 NOTE — Telephone Encounter (Signed)
Lmom to confirm and screen for 09-01-19 ov.

## 2019-09-01 ENCOUNTER — Other Ambulatory Visit: Payer: Self-pay

## 2019-09-01 ENCOUNTER — Encounter: Payer: Self-pay | Admitting: Nurse Practitioner

## 2019-09-01 ENCOUNTER — Ambulatory Visit: Payer: BC Managed Care – PPO | Admitting: Nurse Practitioner

## 2019-09-01 VITALS — BP 130/76 | HR 75 | Temp 97.2°F | Resp 16 | Ht 66.0 in | Wt 212.2 lb

## 2019-09-01 DIAGNOSIS — I10 Essential (primary) hypertension: Secondary | ICD-10-CM

## 2019-09-01 DIAGNOSIS — T7840XD Allergy, unspecified, subsequent encounter: Secondary | ICD-10-CM | POA: Diagnosis not present

## 2019-09-01 DIAGNOSIS — E1165 Type 2 diabetes mellitus with hyperglycemia: Secondary | ICD-10-CM

## 2019-09-01 DIAGNOSIS — E010 Iodine-deficiency related diffuse (endemic) goiter: Secondary | ICD-10-CM | POA: Diagnosis not present

## 2019-09-01 DIAGNOSIS — R5383 Other fatigue: Secondary | ICD-10-CM | POA: Diagnosis not present

## 2019-09-01 MED ORDER — GLIPIZIDE 5 MG PO TABS: 5.0000 mg | ORAL_TABLET | Freq: Every day | ORAL | 1 refills | Status: DC

## 2019-09-01 MED ORDER — METFORMIN HCL 500 MG PO TABS: 2000.0000 mg | ORAL_TABLET | Freq: Every evening | ORAL | 1 refills | Status: DC

## 2019-09-01 NOTE — Progress Notes (Signed)
[facility] [place], [place] [address]  Internal MEDICINE  Office Visit Note  Patient Name: Danielle Wallace  [id number]  [id number]  Date of Service: 09/19/2019  Chief Complaint  Patient presents with  . Diabetes  . Arthritis  . Allergies    allergy test   . Medication Management    stopped invokana for 3 days and has felt significantly better than she has been     The patient is here for follow up visit. Review labs which  indicated very low allergy to sesame seed. Other food allergies are negative. Continues to have severe fatigue and difficulty with weight management. Has historically good control of her blood sugars, though this is currently managed by endocrinology. Her last HgbA1c check was 6.6 in 06/2019. She wishes to have [place], as primary care, take over control of diabetes. She is currently taking metformin and glipizide to maintain blood sugars. She is curious of there may be problem with her thyroid. Does often feel swelling in the lower part of her throat. This can interfere with her ability to swallow. She denies pain. Last check of thyroid panel was done 06/2019 and was normal.       Current Medication: Outpatient Encounter Medications as of 09/01/2019  Medication Sig Note  . albuterol (PROVENTIL) (2.5 MG/3ML) 0.083% nebulizer solution Take 3 mLs (2.5 mg total) by nebulization every 6 (six) hours as needed for wheezing or shortness of breath.   . B COMPLEX-C PO Take by mouth daily.   . Calcium Carb-Cholecalciferol (CALCIUM + D3) 600-200 MG-UNIT TABS Take 1 tablet by mouth 2 (two) times daily.   . diphenhydrAMINE (BENADRYL) 25 MG tablet Take 25 mg by mouth every 6 (six) hours as needed.   Marland Kitchen EPINEPHrine (EPIPEN 2-PAK) 0.3 mg/0.3 mL IJ SOAJ injection Inject 0.3 mLs (0.3 mg total) into the skin as needed.   . etodolac (LODINE) 400 MG tablet TAKE 1 TABLET BY MOUTH  2 TIMES A DAY AS NEEDED FOR PAIN   . fluticasone (FLONASE) 50 MCG/ACT nasal spray Place  into both nostrils daily.   Marland Kitchen imipramine (TOFRANIL) 25 MG tablet TAKE 2 TABLETS (50 MG TOTAL) BY MOUTH AT BEDTIME.   . metFORMIN (GLUCOPHAGE) 500 MG tablet Take 4 tablets (2,000 mg total) by mouth every evening.   . nadolol (CORGARD) 80 MG tablet TAKE 1 TABLET (80 MG TOTAL) BY MOUTH DAILY. **PLEASE SCHEDULE 6 MONTH FOLLOW UP APPT**   . Omega-3 Fatty Acids (FISH OIL OMEGA-3 PO) Take by mouth daily.   . pantoprazole (PROTONIX) 40 MG tablet Take 1 tablet (40 mg total) by mouth daily.   Marland Kitchen PROAIR HFA 108 (90 Base) MCG/ACT inhaler 2 puffs 2 (two) times daily as needed.   . tamoxifen (NOLVADEX) 20 MG tablet TAKE 1 TABLET BY MOUTH EVERY DAY   . valsartan-hydrochlorothiazide (DIOVAN-HCT) 80-12.5 MG tablet Take 1 tablet by mouth daily.   Marland Kitchen VITAMIN E PO Take by mouth daily.   . [DISCONTINUED] glipiZIDE (GLUCOTROL XL) 5 MG 24 hr tablet Take 2.5 mg by mouth daily with supper.   . [DISCONTINUED] metFORMIN (GLUCOPHAGE) 500 MG tablet Take 2,000 mg by mouth every evening.    Marland Kitchen glipiZIDE (GLUCOTROL) 5 MG tablet Take 1 tablet (5 mg total) by mouth daily before breakfast.   . [DISCONTINUED] canagliflozin (INVOKANA) 300 MG TABS tablet TAKE 1 TABLET BY MOUTH EVERY DAY 09/01/2019: patient stopped after she ran out of medication   No facility-administered encounter medications on file as of  09/01/2019.    Surgical History: Past Surgical History:  Procedure Laterality Date  . BREAST BIOPSY Right 2005   negative  . BREAST BIOPSY Left 08/23/2007   negative  . BREAST BIOPSY Left 10/24/2013   positive  . BREAST BIOPSY Left 07/2002   neg  . BREAST BIOPSY Left 08/23/2007   neg  . BREAST SURGERY Left 10/2011   Cyst Aspirationapocrine metaplasia, ductal cells and bone cells, hypo-cellular  . BREAST SURGERY Left 2009   ADH on stereotactic biopsy, 1.5 mm focus.  Marland Kitchen BREAST SURGERY Left 2003   fibrocystic changes with ductal hyperplasia without atypia.  Marland Kitchen BREAST SURGERY Left    mastectomy  . BREAST SURGERY Left April 11, 2014   Removal of implant, debridement chest wall Dr.Coan  . CESAREAN SECTION  1991  . CHOLECYSTECTOMY  1990  . COLONOSCOPY  08/26/2013   Verdie Shire, M.D. normal.  . CRYOABLATION  2005, 2010  . CYST REMOVAL NECK  10/2011   Dr. Tami Ribas  . ESOPHAGOGASTRODUODENOSCOPY (EGD) WITH PROPOFOL N/A 01/16/2017   Procedure: ESOPHAGOGASTRODUODENOSCOPY (EGD) WITH PROPOFOL;  Surgeon: Lucilla Lame, MD;  Location: [place];  Service: Gastroenterology;  Laterality: N/A;  Diabetic - oral meds  . INCISION AND DRAINAGE Left Dec 2015, Feb 2016   Dr Tula Nakayama  . MASTECTOMY Left 11/24/2013   positive  . PORTACATH PLACEMENT  11/24/13    Medical History: Past Medical History:  Diagnosis Date  . Arthritis 2006  . Asthma   . Breast cancer ([place]) 10/14/2013   18 mm, T1c, N0; ER/ PR positive,her 2 neu overexpressed. Adjuvant chemo/ herceptin.  . Cancer [facility]) 2006   Renal cell carcinoma; cryosurgery treatment, right side  . Cirrhosis ([place])   . Collapsed lung 2007  . Diabetes mellitus without complication ([place]) 7124   Metformin  . Diffuse cystic mastopathy   . Motion sickness    any moving vehicle  . Orthodontics    braces  . Personal history of chemotherapy   . Sinus problem     Family History: Family History  Problem Relation Age of Onset  . Liver cancer Father   . Hemochromatosis Father   . Liver disease Father   . Diabetes Mother   . Hypertension Mother   . Breast cancer Paternal Aunt 16  . Ovarian cancer Maternal Grandmother   . Diabetes Sister   . Hemachromatosis Daughter     Social History   Socioeconomic History  . Marital status: Married    Spouse name: Not on file  . Number of children: Not on file  . Years of education: Not on file  . Highest education level: Not on file  Occupational History  . Not on file  Tobacco Use  . Smoking status: Never Smoker  . Smokeless tobacco: Never Used  Substance and Sexual Activity  . Alcohol use: Not Currently    Comment: occasionally,  none recently  . Drug use: No  . Sexual activity: Not on file  Other Topics Concern  . Not on file  Social History Narrative  . Not on file   Social Determinants of Health   Financial Resource Strain:   . Difficulty of Paying Living Expenses:   Food Insecurity:   . Worried About Charity fundraiser in the Last Year:   . Arboriculturist in the Last Year:   Transportation Needs:   . Film/video editor (Medical):   Marland Kitchen Lack of Transportation (Non-Medical):   Physical Activity:   . Days  of Exercise per Week:   . Minutes of Exercise per Session:   Stress:   . Feeling of Stress :   Social Connections:   . Frequency of Communication with Friends and Family:   . Frequency of Social Gatherings with Friends and Family:   . Attends Religious Services:   . Active Member of Clubs or Organizations:   . Attends Archivist Meetings:   Marland Kitchen Marital Status:   Intimate Partner Violence:   . Fear of Current or Ex-Partner:   . Emotionally Abused:   Marland Kitchen Physically Abused:   . Sexually Abused:       Review of Systems  Constitutional: Positive for fatigue. Negative for chills and unexpected weight change.  HENT: Positive for congestion and postnasal drip. Negative for rhinorrhea and sore throat.        Chronic allergy symptoms. New allergic symptoms related to eating.  Respiratory: Negative for cough, chest tightness, shortness of breath and wheezing.   Cardiovascular: Negative for chest pain and palpitations.  Gastrointestinal: Negative for abdominal pain, constipation, diarrhea, nausea and vomiting.  Endocrine: Negative for cold intolerance, heat intolerance, polydipsia and polyuria.       Blood sugars doing well   Musculoskeletal: Negative for arthralgias, back pain, joint swelling and neck pain.  Skin: Negative for rash.  Allergic/Immunologic: Positive for environmental allergies and food allergies.  Neurological: Positive for dizziness and headaches. Negative for tremors and  numbness.  Hematological: Negative for adenopathy. Does not bruise/bleed easily.  Psychiatric/Behavioral: Negative for behavioral problems (Depression), sleep disturbance and suicidal ideas. The patient is not nervous/anxious.     Today's Vitals   09/01/19 1145  BP: 130/76  Pulse: 75  Resp: 16  Temp: (!) 97.2 F (36.2 C)  SpO2: 99%  Weight: 212 lb 3.2 oz (96.3 kg)  Height: 5' 6"  (1.676 m)   Body mass index is 34.25 kg/m.  Physical Exam Vitals and nursing note reviewed.  Constitutional:      General: She is not in acute distress.    Appearance: Normal appearance. She is well-developed. She is not diaphoretic.  HENT:     Head: Normocephalic and atraumatic.     Nose: Nose normal.     Mouth/Throat:     Pharynx: No oropharyngeal exudate.  Eyes:     Conjunctiva/sclera: Conjunctivae normal.     Pupils: Pupils are equal, round, and reactive to light.  Neck:     Thyroid: No thyromegaly.     Vascular: No carotid bruit or JVD.     Trachea: No tracheal deviation.     Comments: Mild thyromegaly present.  Cardiovascular:     Rate and Rhythm: Normal rate and regular rhythm.     Heart sounds: Normal heart sounds. No murmur. No friction rub. No gallop.   Pulmonary:     Effort: Pulmonary effort is normal. No respiratory distress.     Breath sounds: Normal breath sounds. No wheezing or rales.  Chest:     Chest wall: No tenderness.  Abdominal:     Palpations: Abdomen is soft.  Musculoskeletal:        General: Normal range of motion.     Cervical back: Normal range of motion and neck supple.  Lymphadenopathy:     Cervical: No cervical adenopathy.  Skin:    General: Skin is warm and dry.  Neurological:     Mental Status: She is alert and oriented to person, place, and time.     Cranial Nerves: No cranial nerve deficit.  Psychiatric:        Mood and Affect: Mood normal.        Behavior: Behavior normal.        Thought Content: Thought content normal.        Judgment: Judgment  normal.   Assessment/Plan: 1. Thyromegaly Mild thyroimegaly on physical exam. Normal thyroid labs 06/2019. Will get thyroid ultrasound for further evaluation.  - US THYROID; Future  2. Other fatigue Check labs, including anemia and recheck full thyroid panel, including thyroid autoantibodies.   3. Type 2 diabetes mellitus with hyperglycemia, without long-term current use of insulin ([place]) Well managed. Continue metformin and glipizide as prescribed. Refills provided today.  - metFORMIN (GLUCOPHAGE) 500 MG tablet; Take 4 tablets (2,000 mg total) by mouth every evening.  Dispense: 360 tablet; Refill: 1 - glipiZIDE (GLUCOTROL) 5 MG tablet; Take 1 tablet (5 mg total) by mouth daily before breakfast.  Dispense: 90 tablet; Refill: 1  4. Allergic reaction, subsequent encounter Reviewed labs showing mild allergy to sesame seeds. Other food allergies negative. Will monitor.   5. Essential hypertension Stable. Continue bp medication as prescribed   General Counseling: manmeet arzola understanding of the findings of todays visit and agrees with plan of treatment. I have discussed any further diagnostic evaluation that may be needed or ordered today. We also reviewed her medications today. she has been encouraged to call the office with any questions or concerns that should arise related to todays visit.  Diabetes Counseling:  1. Addition of ACE inh/ ARB'S for nephroprotection. Microalbumin is updated  2. Diabetic foot care, prevention of complications. Podiatry consult 3. Exercise and lose weight.  4. Diabetic eye examination, Diabetic eye exam is updated  5. Monitor blood sugar closlely. nutrition counseling.  6. Sign and symptoms of hypoglycemia including shaking sweating,confusion and headaches.  This patient was seen by Leretha Pol FNP Collaboration with Dr Lavera Guise as a part of collaborative care agreement  Orders Placed This Encounter  Procedures  . US THYROID    Meds  ordered this encounter  Medications  . metFORMIN (GLUCOPHAGE) 500 MG tablet    Sig: Take 4 tablets (2,000 mg total) by mouth every evening.    Dispense:  360 tablet    Refill:  1    Order Specific Question:   Supervising Provider    Answer:   Lavera Guise [[id number]]  . glipiZIDE (GLUCOTROL) 5 MG tablet    Sig: Take 1 tablet (5 mg total) by mouth daily before breakfast.    Dispense:  90 tablet    Refill:  1    Order Specific Question:   Supervising Provider    Answer:   Lavera Guise [[id number]]    Total time spent: 30 Minutes   Time spent includes review of chart, medications, test results, and follow up plan with the patient.      Dr Lavera Guise Internal medicine

## 2019-09-14 DIAGNOSIS — Z0189 Encounter for other specified special examinations: Secondary | ICD-10-CM | POA: Diagnosis not present

## 2019-09-19 DIAGNOSIS — E010 Iodine-deficiency related diffuse (endemic) goiter: Secondary | ICD-10-CM | POA: Insufficient documentation

## 2019-09-21 DIAGNOSIS — R197 Diarrhea, unspecified: Secondary | ICD-10-CM | POA: Diagnosis not present

## 2019-09-21 DIAGNOSIS — E559 Vitamin D deficiency, unspecified: Secondary | ICD-10-CM | POA: Diagnosis not present

## 2019-09-21 DIAGNOSIS — E119 Type 2 diabetes mellitus without complications: Secondary | ICD-10-CM | POA: Diagnosis not present

## 2019-09-21 DIAGNOSIS — R110 Nausea: Secondary | ICD-10-CM | POA: Diagnosis not present

## 2019-09-21 NOTE — Progress Notes (Signed)
Scanned in labs from [place].

## 2019-09-22 ENCOUNTER — Other Ambulatory Visit: Payer: Self-pay

## 2019-09-22 DIAGNOSIS — M159 Polyosteoarthritis, unspecified: Secondary | ICD-10-CM

## 2019-09-22 MED ORDER — ETODOLAC 400 MG PO TABS: ORAL_TABLET | ORAL | 1 refills | Status: DC

## 2019-09-30 ENCOUNTER — Other Ambulatory Visit: Payer: Self-pay

## 2019-09-30 ENCOUNTER — Ambulatory Visit: Payer: BC Managed Care – PPO

## 2019-09-30 DIAGNOSIS — E010 Iodine-deficiency related diffuse (endemic) goiter: Secondary | ICD-10-CM

## 2019-10-04 ENCOUNTER — Telehealth: Payer: Self-pay

## 2019-10-04 NOTE — Telephone Encounter (Signed)
Confirmed and screened for 10-06-19 ov.

## 2019-10-06 ENCOUNTER — Ambulatory Visit: Payer: BC Managed Care – PPO | Admitting: Nurse Practitioner

## 2019-10-06 ENCOUNTER — Other Ambulatory Visit: Payer: Self-pay

## 2019-10-06 ENCOUNTER — Encounter: Payer: Self-pay | Admitting: Nurse Practitioner

## 2019-10-06 VITALS — BP 128/68 | HR 65 | Temp 96.3°F | Resp 16 | Ht 66.0 in | Wt 215.6 lb

## 2019-10-06 DIAGNOSIS — I10 Essential (primary) hypertension: Secondary | ICD-10-CM | POA: Diagnosis not present

## 2019-10-06 DIAGNOSIS — E042 Nontoxic multinodular goiter: Secondary | ICD-10-CM | POA: Diagnosis not present

## 2019-10-06 DIAGNOSIS — E1165 Type 2 diabetes mellitus with hyperglycemia: Secondary | ICD-10-CM

## 2019-10-06 MED ORDER — ONETOUCH VERIO VI STRP: ORAL_STRIP | 12 refills | Status: DC

## 2019-10-06 NOTE — Progress Notes (Signed)
[facility] [place], [place] [address]  Internal MEDICINE  Office Visit Note  Patient Name: Danielle Wallace  [id number]  [id number]  Date of Service: 10/10/2019  Chief Complaint  Patient presents with  . Follow-up    Review ultrasound  . Diabetes  . Arthritis  . Asthma  . Medication Management    pt stopped taking metformin because she was having nausea and upset stomach, added tresiba    The patient is here for routine follow up. She states that her stomach Is feeling better. She is less nauseated. She did see endocrinology. Medication changes were made. Her metformin and invokana were discontinued. She is now on tresiba at 14 units daily. She is increasing this by two units every few days until her blood sugars are routinely under 130. She is now taking 22 units daily.  She has had ultrasound of thyroid since her last visit. She has single nodule on right lobe of thyroid and two nodules on the left side. These are all sub centimeter in size.  Thyroid ultrasound should be repeated in six months for surveillance.       Current Medication: Outpatient Encounter Medications as of 10/06/2019  Medication Sig  . albuterol (PROVENTIL) (2.5 MG/3ML) 0.083% nebulizer solution Take 3 mLs (2.5 mg total) by nebulization every 6 (six) hours as needed for wheezing or shortness of breath.  . B COMPLEX-C PO Take by mouth daily.  . Calcium Carb-Cholecalciferol (CALCIUM + D3) 600-200 MG-UNIT TABS Take 1 tablet by mouth 2 (two) times daily.  . diphenhydrAMINE (BENADRYL) 25 MG tablet Take 25 mg by mouth every 6 (six) hours as needed.  Marland Kitchen EPINEPHrine (EPIPEN 2-PAK) 0.3 mg/0.3 mL IJ SOAJ injection Inject 0.3 mLs (0.3 mg total) into the skin as needed.  . etodolac (LODINE) 400 MG tablet TAKE 1 TABLET BY MOUTH  2 TIMES A DAY AS NEEDED FOR PAIN  . fluticasone (FLONASE) 50 MCG/ACT nasal spray Place into both nostrils daily.  Marland Kitchen glipiZIDE (GLUCOTROL) 5 MG tablet Take 1 tablet (5 mg  total) by mouth daily before breakfast.  . imipramine (TOFRANIL) 25 MG tablet TAKE 2 TABLETS (50 MG TOTAL) BY MOUTH AT BEDTIME.  . insulin degludec (TRESIBA FLEXTOUCH) 100 UNIT/ML FlexTouch Pen Inject into the skin daily. Take in the evening, raise dosage by 2 units every 3 days until blood sugar is 130 or below.  . nadolol (CORGARD) 80 MG tablet TAKE 1 TABLET (80 MG TOTAL) BY MOUTH DAILY. **PLEASE SCHEDULE 6 MONTH FOLLOW UP APPT**  . Omega-3 Fatty Acids (FISH OIL OMEGA-3 PO) Take by mouth daily.  . pantoprazole (PROTONIX) 40 MG tablet Take 1 tablet (40 mg total) by mouth daily.  Marland Kitchen PROAIR HFA 108 (90 Base) MCG/ACT inhaler 2 puffs 2 (two) times daily as needed.  . tamoxifen (NOLVADEX) 20 MG tablet TAKE 1 TABLET BY MOUTH EVERY DAY  . valsartan-hydrochlorothiazide (DIOVAN-HCT) 80-12.5 MG tablet Take 1 tablet by mouth daily.  Marland Kitchen VITAMIN E PO Take by mouth daily.  . [DISCONTINUED] metFORMIN (GLUCOPHAGE) 500 MG tablet Take 4 tablets (2,000 mg total) by mouth every evening.  Marland Kitchen glucose blood (ONETOUCH VERIO) test strip Blood sugar testing done QD and as needed  E11.65   No facility-administered encounter medications on file as of 10/06/2019.    Surgical History: Past Surgical History:  Procedure Laterality Date  . BREAST BIOPSY Right 2005   negative  . BREAST BIOPSY Left 08/23/2007   negative  . BREAST BIOPSY Left 10/24/2013  positive  . BREAST BIOPSY Left 07/2002   neg  . BREAST BIOPSY Left 08/23/2007   neg  . BREAST SURGERY Left 10/2011   Cyst Aspirationapocrine metaplasia, ductal cells and bone cells, hypo-cellular  . BREAST SURGERY Left 2009   ADH on stereotactic biopsy, 1.5 mm focus.  Marland Kitchen BREAST SURGERY Left 2003   fibrocystic changes with ductal hyperplasia without atypia.  Marland Kitchen BREAST SURGERY Left    mastectomy  . BREAST SURGERY Left April 11, 2014   Removal of implant, debridement chest wall Dr.Coan  . CESAREAN SECTION  1991  . CHOLECYSTECTOMY  1990  . COLONOSCOPY  08/26/2013   Verdie Shire, M.D. normal.  . CRYOABLATION  2005, 2010  . CYST REMOVAL NECK  10/2011   Dr. Tami Ribas  . ESOPHAGOGASTRODUODENOSCOPY (EGD) WITH PROPOFOL N/A 01/16/2017   Procedure: ESOPHAGOGASTRODUODENOSCOPY (EGD) WITH PROPOFOL;  Surgeon: Lucilla Lame, MD;  Location: [facility];  Service: Gastroenterology;  Laterality: N/A;  Diabetic - oral meds  . INCISION AND DRAINAGE Left Dec 2015, Feb 2016   Dr Tula Nakayama  . MASTECTOMY Left 11/24/2013   positive  . PORTACATH PLACEMENT  11/24/13    Medical History: Past Medical History:  Diagnosis Date  . Arthritis 2006  . Asthma   . Breast cancer ([place]) 10/14/2013   18 mm, T1c, N0; ER/ PR positive,her 2 neu overexpressed. Adjuvant chemo/ herceptin.  . Cancer [facility]) 2006   Renal cell carcinoma; cryosurgery treatment, right side  . Cirrhosis ([place])   . Collapsed lung 2007  . Diabetes mellitus without complication ([place]) 2446   Metformin  . Diffuse cystic mastopathy   . Motion sickness    any moving vehicle  . Orthodontics    braces  . Personal history of chemotherapy   . Sinus problem     Family History: Family History  Problem Relation Age of Onset  . Liver cancer Father   . Hemochromatosis Father   . Liver disease Father   . Diabetes Mother   . Hypertension Mother   . Breast cancer Paternal Aunt 44  . Ovarian cancer Maternal Grandmother   . Diabetes Sister   . Hemachromatosis Daughter     Social History   Socioeconomic History  . Marital status: Married    Spouse name: Not on file  . Number of children: Not on file  . Years of education: Not on file  . Highest education level: Not on file  Occupational History  . Not on file  Tobacco Use  . Smoking status: Never Smoker  . Smokeless tobacco: Never Used  Substance and Sexual Activity  . Alcohol use: Not Currently    Comment: occasionally, none recently  . Drug use: No  . Sexual activity: Not on file  Other Topics Concern  . Not on file  Social History Narrative  . Not on file    Social Determinants of Health   Financial Resource Strain:   . Difficulty of Paying Living Expenses:   Food Insecurity:   . Worried About Charity fundraiser in the Last Year:   . Arboriculturist in the Last Year:   Transportation Needs:   . Film/video editor (Medical):   Marland Kitchen Lack of Transportation (Non-Medical):   Physical Activity:   . Days of Exercise per Week:   . Minutes of Exercise per Session:   Stress:   . Feeling of Stress :   Social Connections:   . Frequency of Communication with Friends and Family:   .  Frequency of Social Gatherings with Friends and Family:   . Attends Religious Services:   . Active Member of Clubs or Organizations:   . Attends Archivist Meetings:   Marland Kitchen Marital Status:   Intimate Partner Violence:   . Fear of Current or Ex-Partner:   . Emotionally Abused:   Marland Kitchen Physically Abused:   . Sexually Abused:       Review of Systems  Constitutional: Negative for chills, fatigue and unexpected weight change.  HENT: Negative for congestion, postnasal drip, rhinorrhea and sore throat.   Respiratory: Negative for cough, chest tightness, shortness of breath and wheezing.   Cardiovascular: Negative for chest pain and palpitations.  Gastrointestinal: Negative for abdominal pain, constipation, diarrhea, nausea and vomiting.  Endocrine: Negative for cold intolerance, heat intolerance, polydipsia and polyuria.       Blood sugars doing well. Single, sub centimeter nodule present on right thyroid lobe and two sub centimeter nodules on left lobe of thyroid on recent thyroid ultrasound.   Musculoskeletal: Negative for arthralgias, back pain, joint swelling and neck pain.  Skin: Negative for rash.  Allergic/Immunologic: Positive for environmental allergies and food allergies.  Neurological: Positive for dizziness and headaches. Negative for tremors and numbness.  Hematological: Negative for adenopathy. Does not bruise/bleed easily.   Psychiatric/Behavioral: Negative for behavioral problems (Depression), sleep disturbance and suicidal ideas. The patient is not nervous/anxious.    Today's Vitals   10/06/19 1027  BP: 128/68  Pulse: 65  Resp: 16  Temp: (!) 96.3 F (35.7 C)  SpO2: 96%  Weight: 215 lb 9.6 oz (97.8 kg)  Height: 5' 6"  (1.676 m)   Body mass index is 34.8 kg/m.  Physical Exam Vitals and nursing note reviewed.  Constitutional:      General: She is not in acute distress.    Appearance: Normal appearance. She is well-developed. She is not diaphoretic.  HENT:     Head: Normocephalic and atraumatic.     Nose: Nose normal.     Mouth/Throat:     Pharynx: No oropharyngeal exudate.  Eyes:     Conjunctiva/sclera: Conjunctivae normal.     Pupils: Pupils are equal, round, and reactive to light.  Neck:     Thyroid: No thyromegaly.     Vascular: No carotid bruit or JVD.     Trachea: No tracheal deviation.     Comments: Mild thyromegaly present.  Cardiovascular:     Rate and Rhythm: Normal rate and regular rhythm.     Heart sounds: Normal heart sounds. No murmur. No friction rub. No gallop.   Pulmonary:     Effort: Pulmonary effort is normal. No respiratory distress.     Breath sounds: Normal breath sounds. No wheezing or rales.  Chest:     Chest wall: No tenderness.  Abdominal:     Palpations: Abdomen is soft.  Musculoskeletal:        General: Normal range of motion.     Cervical back: Normal range of motion and neck supple.  Lymphadenopathy:     Cervical: No cervical adenopathy.  Skin:    General: Skin is warm and dry.  Neurological:     Mental Status: She is alert and oriented to person, place, and time.     Cranial Nerves: No cranial nerve deficit.  Psychiatric:        Mood and Affect: Mood normal.        Behavior: Behavior normal.        Thought Content: Thought content normal.  Judgment: Judgment normal.    Assessment/Plan: 1. Multinodular goiter Reviewed results of recent  thyroid ultrasound. This showed single nodule on right lobe of thyroid and two nodules on the left side. These are all sub centimeter in size.  Will repeat the thyroid ultrasound in 6 months for surveillance. Thyroid panel has, historically, been normal.  - US THYROID; Future  2. Uncontrolled type 2 diabetes mellitus with hyperglycemia [facility]) Patient being followed per endocrinology. Medication changes updated in her medication list.  - glucose blood (ONETOUCH VERIO) test strip; Blood sugar testing done QD and as needed  E11.65  Dispense: 100 each; Refill: 12  3. Essential hypertension Stable. Continue bp medication as prescribed   General Counseling: jasmon mattice understanding of the findings of todays visit and agrees with plan of treatment. I have discussed any further diagnostic evaluation that may be needed or ordered today. We also reviewed her medications today. she has been encouraged to call the office with any questions or concerns that should arise related to todays visit.  This patient was seen by Leretha Pol FNP Collaboration with Dr Lavera Guise as a part of collaborative care agreement  Orders Placed This Encounter  Procedures  . US THYROID    Meds ordered this encounter  Medications  . glucose blood (ONETOUCH VERIO) test strip    Sig: Blood sugar testing done QD and as needed  E11.65    Dispense:  100 each    Refill:  12    Order Specific Question:   Supervising Provider    Answer:   Lavera Guise [[id number]]    Total time spent: 25 Minutes   Time spent includes review of chart, medications, test results, and follow up plan with the patient.      Dr Lavera Guise Internal medicine

## 2019-10-10 DIAGNOSIS — E042 Nontoxic multinodular goiter: Secondary | ICD-10-CM | POA: Insufficient documentation

## 2019-10-25 ENCOUNTER — Ambulatory Visit: Payer: BC Managed Care – PPO | Admitting: Nurse Practitioner

## 2019-11-02 ENCOUNTER — Other Ambulatory Visit: Payer: Self-pay

## 2019-11-02 ENCOUNTER — Encounter: Payer: Self-pay | Admitting: Internal Medicine

## 2019-11-02 ENCOUNTER — Inpatient Hospital Stay: Payer: BC Managed Care – PPO | Attending: Internal Medicine | Admitting: Internal Medicine

## 2019-11-02 VITALS — BP 134/75 | HR 76 | Temp 98.0°F | Wt 214.0 lb

## 2019-11-02 DIAGNOSIS — E1165 Type 2 diabetes mellitus with hyperglycemia: Secondary | ICD-10-CM | POA: Diagnosis not present

## 2019-11-02 DIAGNOSIS — K7460 Unspecified cirrhosis of liver: Secondary | ICD-10-CM | POA: Insufficient documentation

## 2019-11-02 DIAGNOSIS — Z794 Long term (current) use of insulin: Secondary | ICD-10-CM | POA: Diagnosis not present

## 2019-11-02 DIAGNOSIS — Z9221 Personal history of antineoplastic chemotherapy: Secondary | ICD-10-CM | POA: Insufficient documentation

## 2019-11-02 DIAGNOSIS — Z79899 Other long term (current) drug therapy: Secondary | ICD-10-CM | POA: Diagnosis not present

## 2019-11-02 DIAGNOSIS — C641 Malignant neoplasm of right kidney, except renal pelvis: Secondary | ICD-10-CM

## 2019-11-02 DIAGNOSIS — E119 Type 2 diabetes mellitus without complications: Secondary | ICD-10-CM | POA: Insufficient documentation

## 2019-11-02 DIAGNOSIS — Z85528 Personal history of other malignant neoplasm of kidney: Secondary | ICD-10-CM | POA: Insufficient documentation

## 2019-11-02 DIAGNOSIS — C50412 Malignant neoplasm of upper-outer quadrant of left female breast: Secondary | ICD-10-CM | POA: Diagnosis not present

## 2019-11-02 DIAGNOSIS — R232 Flushing: Secondary | ICD-10-CM | POA: Insufficient documentation

## 2019-11-02 DIAGNOSIS — M1990 Unspecified osteoarthritis, unspecified site: Secondary | ICD-10-CM | POA: Diagnosis not present

## 2019-11-02 DIAGNOSIS — Z79810 Long term (current) use of selective estrogen receptor modulators (SERMs): Secondary | ICD-10-CM | POA: Diagnosis not present

## 2019-11-02 DIAGNOSIS — Z170 Estrogen receptor positive status [ER+]: Secondary | ICD-10-CM

## 2019-11-02 DIAGNOSIS — Z8051 Family history of malignant neoplasm of kidney: Secondary | ICD-10-CM | POA: Insufficient documentation

## 2019-11-02 DIAGNOSIS — Z9012 Acquired absence of left breast and nipple: Secondary | ICD-10-CM | POA: Insufficient documentation

## 2019-11-02 DIAGNOSIS — E559 Vitamin D deficiency, unspecified: Secondary | ICD-10-CM | POA: Diagnosis not present

## 2019-11-02 MED ORDER — TAMOXIFEN CITRATE 10 MG PO TABS: 10.0000 mg | ORAL_TABLET | Freq: Every day | ORAL | 6 refills | Status: DC

## 2019-11-02 NOTE — Progress Notes (Signed)
[facility] OFFICE PROGRESS NOTE  Patient Care Team: Lavera Guise, MD as PCP - General (Internal Medicine) Bary Castilla, Forest Gleason, MD (General Surgery) Dion Body, MD as Referring Physician (Family Medicine) Forest Gleason, MD (Inactive) (Oncology)  Cancer Staging Carcinoma of upper-outer quadrant of left breast in female, estrogen receptor positive [facility]) Staging form: Breast, AJCC 7th Edition - Clinical: No stage assigned - Unsigned    Oncology History Overview Note  # July 2016- 1.carcinoma of breast(left) T1 C. N0 M0 status post ultrasound-guided biopsy Estrogen receptor positive. Progesterone receptor positive.  HER-2/neu amplified by FISH (3.8)  status pos  tnipple sparing mastectomy on the left side with reconstruction (July, 2016) Final staging is  yP1C yNOsn M0  stage 1 c  2.MRSA infection associated with left breast implant (August, 2015) 3.patient started chemotherapy with [facility] from 11th August, 2015. chemotherapy on hold since November because of cellulitis in the chest wall. 4.started on Herceptin May 22, 2014.  Cause of recurrent chest wall  infection chemotherapy was put on hold. 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  Patient is on multiple antibiotic and as per infectious disease specialist similar to stay on antibiotic for 4 months. So chemotherapy has been discontinued. Maintenance Herceptin as well as patient was started on letrozole from June 12, 2014 6.  Port was removed because of Pseudomonas infection of the pocket (August, 2016) 7.  Patient is finishing up Herceptin in August of 2016 now on letrozole;   # FEB 2017- TAMOXIFEN [poor tolerance to AI]   # Jan 2019- BCI- intermediate risk- recommend EXTENDED TAM  # Cirrhosis/ ? 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  Patient has been off tamoxifen for the last 3 months.  Notes a significant improvement of her joint pains/body aches.  Otherwise no nausea no vomiting no lumps or bumps.   Review of Systems  Constitutional: Negative for chills, diaphoresis, fever, malaise/fatigue and weight loss.  HENT: Negative for nosebleeds and sore throat.   Eyes: Negative for double vision.  Respiratory: Negative for cough, hemoptysis, sputum production, shortness of breath and wheezing.   Cardiovascular: Negative for chest pain, palpitations, orthopnea and leg swelling.  Gastrointestinal: Negative for abdominal pain, blood in stool, constipation, diarrhea, heartburn, melena, nausea and vomiting.  Genitourinary: Negative for dysuria, frequency and urgency.  Musculoskeletal: Positive for joint pain.  Skin: Negative.  Negative for itching and rash.  Neurological: Negative for dizziness, tingling, focal weakness, weakness and headaches.  Endo/Heme/Allergies: Does not bruise/bleed easily.  Psychiatric/Behavioral: Negative for depression. The patient is not nervous/anxious and does not have insomnia.      PAST MEDICAL HISTORY :  Past Medical History:  Diagnosis Date  . Arthritis 2006  . Asthma   . Breast cancer ([place]) 10/14/2013   18 mm, T1c, N0; ER/ PR positive,her 2 neu overexpressed. Adjuvant chemo/ herceptin.  . Cancer [facility]) 2006   Renal cell carcinoma; cryosurgery treatment, right side  . Cirrhosis ([place])   .  Collapsed lung 2007   . Diabetes mellitus without complication ([place]) 6546   Metformin  . Diffuse cystic mastopathy   . Motion sickness    any moving vehicle  . Orthodontics    braces  . Personal history of chemotherapy   . Sinus problem     PAST SURGICAL HISTORY :   Past Surgical History:  Procedure Laterality Date  . BREAST BIOPSY Right 2005   negative  . BREAST BIOPSY Left 08/23/2007   negative  . BREAST BIOPSY Left 10/24/2013   positive  . BREAST BIOPSY Left 07/2002   neg  . BREAST BIOPSY Left 08/23/2007   neg  . BREAST SURGERY Left 10/2011   Cyst Aspirationapocrine metaplasia, ductal cells and bone cells, hypo-cellular  . BREAST SURGERY Left 2009   ADH on stereotactic biopsy, 1.5 mm focus.  Marland Kitchen BREAST SURGERY Left 2003   fibrocystic changes with ductal hyperplasia without atypia.  Marland Kitchen BREAST SURGERY Left    mastectomy  . BREAST SURGERY Left April 11, 2014   Removal of implant, debridement chest wall Dr.Coan  . CESAREAN SECTION  1991  . CHOLECYSTECTOMY  1990  . COLONOSCOPY  08/26/2013   Verdie Shire, M.D. normal.  . CRYOABLATION  2005, 2010  . CYST REMOVAL NECK  10/2011   Dr. Tami Ribas  . ESOPHAGOGASTRODUODENOSCOPY (EGD) WITH PROPOFOL N/A 01/16/2017   Procedure: ESOPHAGOGASTRODUODENOSCOPY (EGD) WITH PROPOFOL;  Surgeon: Lucilla Lame, MD;  Location: [facility];  Service: Gastroenterology;  Laterality: N/A;  Diabetic - oral meds  . INCISION AND DRAINAGE Left Dec 2015, Feb 2016   Dr Tula Nakayama  . MASTECTOMY Left 11/24/2013   positive  . PORTACATH PLACEMENT  11/24/13    FAMILY HISTORY :   Family History  Problem Relation Age of Onset  . Liver cancer Father   . Hemochromatosis Father   . Liver disease Father   . Diabetes Mother   . Hypertension Mother   . Breast cancer Paternal Aunt 52  . Ovarian cancer Maternal Grandmother   . Diabetes Sister   . Hemachromatosis Daughter     SOCIAL HISTORY:   Social History   Tobacco Use  . Smoking status: Never Smoker  . Smokeless tobacco: Never Used   Vaping Use  . Vaping Use: Never used  Substance Use Topics  . Alcohol use: Not Currently    Comment: occasionally, none recently  . Drug use: No    ALLERGIES:  is allergic to exemestane, floxin [ofloxacin], gluten meal, levofloxacin, linezolid, taxotere [docetaxel], hydrocodone, invokana [canagliflozin], metformin and related, sulfa antibiotics, codeine, penicillins, and vancomycin.  MEDICATIONS:  Current Outpatient Medications  Medication Sig Dispense Refill  . B COMPLEX-C PO Take by mouth daily.    . Calcium Carb-Cholecalciferol (CALCIUM + D3) 600-200 MG-UNIT TABS Take 1 tablet by mouth 2 (two) times daily. 180 tablet 3  . diphenhydrAMINE (BENADRYL) 25 MG tablet Take 25 mg by mouth every 6 (six) hours as needed.    Marland Kitchen EPINEPHrine (EPIPEN 2-PAK) 0.3 mg/0.3 mL IJ SOAJ injection Inject 0.3 mLs (0.3 mg total) into the skin as needed. 1 each 1  . etodolac (LODINE) 400 MG tablet TAKE 1 TABLET BY MOUTH  2 TIMES A DAY AS NEEDED FOR PAIN 60 tablet 1  . fluticasone (FLONASE) 50 MCG/ACT nasal spray Place into both nostrils daily.    Marland Kitchen glipiZIDE (GLUCOTROL) 5 MG tablet Take 1 tablet (5 mg total) by mouth daily before breakfast. 90 tablet 1  . glucose blood (ONETOUCH VERIO) test strip Blood sugar testing  done QD and as needed  E11.65 100 each 12  . imipramine (TOFRANIL) 25 MG tablet TAKE 2 TABLETS (50 MG TOTAL) BY MOUTH AT BEDTIME. 180 tablet 1  . insulin degludec (TRESIBA FLEXTOUCH) 100 UNIT/ML FlexTouch Pen Inject into the skin daily. Take in the evening, raise dosage by 2 units every 3 days until blood sugar is 130 or below.    . nadolol (CORGARD) 80 MG tablet TAKE 1 TABLET (80 MG TOTAL) BY MOUTH DAILY. **PLEASE SCHEDULE 6 MONTH FOLLOW UP APPT** 90 tablet 1  . Omega-3 Fatty Acids (FISH OIL OMEGA-3 PO) Take by mouth daily.    . pantoprazole (PROTONIX) 40 MG tablet Take 1 tablet (40 mg total) by mouth daily. 90 tablet 3  . PROAIR HFA 108 (90 Base) MCG/ACT inhaler 2 puffs 2 (two) times daily as  needed.    . valsartan-hydrochlorothiazide (DIOVAN-HCT) 80-12.5 MG tablet Take 1 tablet by mouth daily. 90 tablet 2  . VITAMIN E PO Take by mouth daily.    Marland Kitchen albuterol (PROVENTIL) (2.5 MG/3ML) 0.083% nebulizer solution Take 3 mLs (2.5 mg total) by nebulization every 6 (six) hours as needed for wheezing or shortness of breath. (Patient not taking: Reported on 11/02/2019) 360 mL 3  . tamoxifen (NOLVADEX) 10 MG tablet Take 1 tablet (10 mg total) by mouth daily. 30 tablet 6   No current facility-administered medications for this visit.    PHYSICAL EXAMINATION: ECOG PERFORMANCE STATUS: 0 - Asymptomatic  BP 134/75 (BP Location: Right Arm, Patient Position: Sitting, Cuff Size: Normal)   Pulse 76   Temp 98 F (36.7 C) (Tympanic)   Wt 214 lb (97.1 kg)   BMI 34.54 kg/m   Filed Weights   11/02/19 1434  Weight: 214 lb (97.1 kg)    Physical Exam HENT:     Head: Normocephalic and atraumatic.     Mouth/Throat:     Pharynx: No oropharyngeal exudate.  Eyes:     Pupils: Pupils are equal, round, and reactive to light.  Cardiovascular:     Rate and Rhythm: Normal rate and regular rhythm.  Pulmonary:     Effort: No respiratory distress.     Breath sounds: No wheezing.  Abdominal:     General: Bowel sounds are normal. There is no distension.     Palpations: Abdomen is soft. There is no mass.     Tenderness: There is no abdominal tenderness. There is no guarding or rebound.  Musculoskeletal:        General: No tenderness. Normal range of motion.     Cervical back: Normal range of motion and neck supple.  Skin:    General: Skin is warm.     Comments:    Neurological:     Mental Status: She is alert and oriented to person, place, and time.  Psychiatric:        Mood and Affect: Affect normal.          LABORATORY DATA:  I have reviewed the data as listed    Component Value Date/Time   NA 138 08/04/2019 1457   NA 141 10/12/2018 0811   NA 138 09/04/2014 1358   K 4.0 08/04/2019  1457   K 4.0 09/04/2014 1358   CL 100 08/04/2019 1457   CL 99 (L) 09/04/2014 1358   CO2 27 08/04/2019 1457   CO2 31 09/04/2014 1358   GLUCOSE 175 (H) 08/04/2019 1457   GLUCOSE 136 (H) 09/04/2014 1358   BUN 25 (H) 08/04/2019 1457   BUN  18 10/12/2018 0811   BUN 18 09/04/2014 1358   CREATININE 0.71 08/04/2019 1457   CREATININE 0.53 09/04/2014 1358   CALCIUM 9.1 08/04/2019 1457   CALCIUM 9.7 09/04/2014 1358   PROT 7.2 08/04/2019 1457   PROT 6.9 10/12/2018 0811   PROT 7.8 09/04/2014 1358   ALBUMIN 3.9 08/04/2019 1457   ALBUMIN 4.4 10/12/2018 0811   ALBUMIN 4.1 09/04/2014 1358   AST 45 (H) 08/04/2019 1457   AST 32 09/04/2014 1358   ALT 50 (H) 08/04/2019 1457   ALT 33 09/04/2014 1358   ALKPHOS 104 08/04/2019 1457   ALKPHOS 73 09/04/2014 1358   BILITOT 0.5 08/04/2019 1457   BILITOT 0.5 10/12/2018 0811   BILITOT 0.6 09/04/2014 1358   GFRNONAA >60 08/04/2019 1457   GFRNONAA >60 09/04/2014 1358   GFRAA >60 08/04/2019 1457   GFRAA >60 09/04/2014 1358    No results found for: SPEP, UPEP  Lab Results  Component Value Date   WBC 8.3 08/04/2019   NEUTROABS 4.3 08/04/2019   HGB 14.9 08/04/2019   HCT 44.4 08/04/2019   MCV 90.6 08/04/2019   PLT 217 08/04/2019      Chemistry      Component Value Date/Time   NA 138 08/04/2019 1457   NA 141 10/12/2018 0811   NA 138 09/04/2014 1358   K 4.0 08/04/2019 1457   K 4.0 09/04/2014 1358   CL 100 08/04/2019 1457   CL 99 (L) 09/04/2014 1358   CO2 27 08/04/2019 1457   CO2 31 09/04/2014 1358   BUN 25 (H) 08/04/2019 1457   BUN 18 10/12/2018 0811   BUN 18 09/04/2014 1358   CREATININE 0.71 08/04/2019 1457   CREATININE 0.53 09/04/2014 1358      Component Value Date/Time   CALCIUM 9.1 08/04/2019 1457   CALCIUM 9.7 09/04/2014 1358   ALKPHOS 104 08/04/2019 1457   ALKPHOS 73 09/04/2014 1358   AST 45 (H) 08/04/2019 1457   AST 32 09/04/2014 1358   ALT 50 (H) 08/04/2019 1457   ALT 33 09/04/2014 1358   BILITOT 0.5 08/04/2019 1457    BILITOT 0.5 10/12/2018 0811   BILITOT 0.6 09/04/2014 1358       RADIOGRAPHIC STUDIES: I have personally reviewed the radiological images as listed and agreed with the findings in the report. No results found.   ASSESSMENT & PLAN:  Carcinoma of upper-outer quadrant of left breast in female, estrogen receptor positive ([place]) Stage I Breast cancer ER/PR/her 2 neu POS- currently on tamoxifen on extended therapy. Re-start tamoxifen ; however start at a lower dose.  #Clinically no evidence of recurrence.  #Arthritis/musculoskeletal symptoms-likely secondary to tamoxifen; improved off tamoxifen.  Start tamoxifen low-dose as above  # hot flashes/ vasomotor symptoms-stable  # BMD- [01/2019-]  T score=-1- continue calcium and vitamin D; stable.  # Right RCC-s/p cryoablation; recommend ultrasound kidneys.   # genetic counselling- mom-died of cancer; hx of RCC  # DISPOSITION:  # Genetic counseling re: breast cnacer/kidney cancer # Bil Kideny [place] in 1-2 weeks.   # follow up in 3 months- MD; no labs--Dr.B     Orders Placed This Encounter  Procedures  . US RENAL    Standing Status:   Future    Standing Expiration Date:   11/01/2020    Order Specific Question:   Reason for Exam (SYMPTOM  OR DIAGNOSIS REQUIRED)    Answer:   kideny cancer    Order Specific Question:   Preferred imaging location?    Answer:  [facility]  . Ambulatory referral to Genetics    Referral Priority:   Routine    Referral Type:   Consultation    Referral Reason:   Specialty Services Required    Number of Visits Requested:   1   All questions were answered. The patient knows to call the clinic with any problems, questions or concerns.      Cammie Sickle, MD 11/03/2019 3:12 PM

## 2019-11-02 NOTE — Assessment & Plan Note (Addendum)
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 Re-start tamoxifen ; however start at a lower dose.  #Clinically no evidence of recurrence.  #Arthritis/musculoskeletal symptoms-likely secondary to tamoxifen; improved off tamoxifen.  Start tamoxifen low-dose as above  # hot flashes/ vasomotor symptoms-stable  # BMD- [01/2019-]  T score=-1- continue calcium and vitamin D; stable.  # Right RCC-s/p cryoablation; recommend ultrasound kidneys.   # genetic counselling- mom-died of cancer; hx of RCC  # DISPOSITION:  # Genetic counseling re: breast cnacer/kidney cancer # Bil [place] in 1-2 weeks.   # follow up in 3 months- MD; no labs--Dr.B

## 2019-11-07 ENCOUNTER — Inpatient Hospital Stay (HOSPITAL_BASED_OUTPATIENT_CLINIC_OR_DEPARTMENT_OTHER): Payer: BC Managed Care – PPO | Admitting: Licensed Clinical Social Worker

## 2019-11-07 ENCOUNTER — Other Ambulatory Visit: Payer: Self-pay

## 2019-11-07 ENCOUNTER — Inpatient Hospital Stay: Payer: BC Managed Care – PPO

## 2019-11-07 ENCOUNTER — Encounter: Payer: Self-pay | Admitting: Licensed Clinical Social Worker

## 2019-11-07 DIAGNOSIS — Z1379 Encounter for other screening for genetic and chromosomal anomalies: Secondary | ICD-10-CM

## 2019-11-07 DIAGNOSIS — Z170 Estrogen receptor positive status [ER+]: Secondary | ICD-10-CM

## 2019-11-07 DIAGNOSIS — Z803 Family history of malignant neoplasm of breast: Secondary | ICD-10-CM

## 2019-11-07 DIAGNOSIS — C50412 Malignant neoplasm of upper-outer quadrant of left female breast: Secondary | ICD-10-CM

## 2019-11-07 DIAGNOSIS — Z800 Family history of malignant neoplasm of digestive organs: Secondary | ICD-10-CM | POA: Insufficient documentation

## 2019-11-07 DIAGNOSIS — Z85528 Personal history of other malignant neoplasm of kidney: Secondary | ICD-10-CM

## 2019-11-07 NOTE — Progress Notes (Signed)
REFERRING PROVIDER: Cammie Sickle, MD [place],  [place] [address]  PRIMARY PROVIDER:  Lavera Guise, MD  PRIMARY REASON FOR VISIT:  1. Carcinoma of upper-outer quadrant of left breast in female, estrogen receptor positive ([place])   2. Family history of breast cancer   3. Family history of liver cancer   4. Personal history of other malignant neoplasm of kidney      HISTORY OF PRESENT ILLNESS:   Danielle Wallace, a 59 y.o. female, was seen for a [facility] cancer genetics consultation at the request of Dr. Rogue Bussing due to a personal and family history of cancer.  Danielle Wallace presents to clinic today to discuss the possibility of a hereditary predisposition to cancer, genetic testing, and to further clarify her future cancer risks, as well as potential cancer risks for family members.   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 This was treated with cryoablation. She had a recurrence of this in 2010 that was also treated with cryoablation.   In 2016, at the age of 65, Danielle Wallace was diagnosed with left breast cancer. This was treated with surgery and chemotherapy.  CANCER HISTORY:  Oncology History Overview Note  # July 2016- 1.carcinoma of breast(left) T1 C. N0 M0 status post ultrasound-guided biopsy Estrogen receptor positive. Progesterone receptor positive.  HER-2/neu amplified by FISH (3.8)  status pos  tnipple sparing mastectomy on the left side with reconstruction (July, 2016) Final staging is  yP1C yNOsn M0  stage 1 c  2.MRSA infection associated with left breast implant (August, 2015) 3.patient started chemotherapy with [facility] from 11th August, 2015. chemotherapy on hold since November because of cellulitis in the chest wall. 4.started on Herceptin May 22, 2014.  Cause of recurrent chest wall  infection chemotherapy was put on hold. 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  Patient is on multiple antibiotic and as per infectious disease specialist similar to stay on antibiotic for 4 months. So chemotherapy has been discontinued. Maintenance Herceptin as well as patient was started on letrozole from June 12, 2014 6.  Port was removed because of Pseudomonas infection of the pocket (August, 2016) 7.  Patient is finishing up Herceptin in August of 2016 now on letrozole;   # FEB 2017- TAMOXIFEN [poor tolerance to AI]   # Jan 2019- [facility]CI- intermediate risk- recommend EXTENDED TAM  # Cirrhosis/ ? 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  First live birth at age 4.  OCP use: yes Ovaries intact: yes.  Hysterectomy: no.  Menopausal status: postmenopausal.  HRT use: 0 years. Colonoscopy: yes; normal. Mammogram within the last year: yes. Number of breast biopsies: "multiple" Up to date with pelvic exams: yes. Any excessive radiation exposure in the past: no  Past Medical History:  Diagnosis Date  . Arthritis 2006  . Asthma   . Breast cancer ([place]) 10/14/2013   18 mm, T1c, N0; ER/ PR positive,her 2 neu overexpressed. Adjuvant chemo/ herceptin.  . Cancer [facility]) 2006   Renal cell carcinoma; cryosurgery treatment, right  side  . Cirrhosis ([place])   . Collapsed lung 2007  . Diabetes mellitus without complication ([place]) 0086   Metformin  . Diffuse cystic mastopathy   . Family history of breast cancer   . Family history of liver cancer   . Motion sickness    any moving vehicle  . Orthodontics    braces  . Personal history of chemotherapy   . Sinus problem      Past Surgical History:  Procedure Laterality Date  . BREAST BIOPSY Right 2005   negative  . BREAST BIOPSY Left 08/23/2007   negative  . BREAST BIOPSY Left 10/24/2013   positive  . BREAST BIOPSY Left 07/2002   neg  . BREAST BIOPSY Left 08/23/2007   neg  . BREAST SURGERY Left 10/2011   Cyst Aspirationapocrine metaplasia, ductal cells and bone cells, hypo-cellular  . BREAST SURGERY Left 2009   ADH on stereotactic biopsy, 1.5 mm focus.  Marland Kitchen BREAST SURGERY Left 2003   fibrocystic changes with ductal hyperplasia without atypia.  Marland Kitchen BREAST SURGERY Left    mastectomy  . BREAST SURGERY Left April 11, 2014   Removal of implant, debridement chest wall Dr.Coan  . CESAREAN SECTION  1991  . CHOLECYSTECTOMY  1990  . COLONOSCOPY  08/26/2013   Verdie Shire, M.D. normal.  . CRYOABLATION  2005, 2010  . CYST REMOVAL NECK  10/2011   Dr. Tami Ribas  . ESOPHAGOGASTRODUODENOSCOPY (EGD) WITH PROPOFOL N/A 01/16/2017   Procedure: ESOPHAGOGASTRODUODENOSCOPY (EGD) WITH PROPOFOL;  Surgeon: Lucilla Lame, MD;  Location: [place];  Service: Gastroenterology;  Laterality: N/A;  Diabetic - oral meds  . INCISION AND DRAINAGE Left Dec 2015, Feb 2016   Dr Tula Nakayama  . MASTECTOMY Left 11/24/2013   positive  . PORTACATH PLACEMENT  11/24/13    Social History   Socioeconomic History  . Marital status: Married    Spouse name: Not on file  . Number of children: Not on file  . Years of education: Not on file  . Highest education level: Not on file  Occupational History  . Not on file  Tobacco Use  . Smoking status: Never Smoker  . Smokeless tobacco: Never Used  Vaping Use  . Vaping Use: Never used  Substance and Sexual Activity  . Alcohol use: Not Currently    Comment: occasionally, none recently  . Drug use: No  . Sexual activity: Not on file  Other Topics Concern  . Not on file  Social History Narrative  . Not on file   Social Determinants of Health   Financial Resource Strain:   . Difficulty of  Paying Living Expenses:   Food Insecurity:   . Worried About Charity fundraiser in the Last Year:   . Arboriculturist in the Last Year:   Transportation Needs:   . Film/video editor (Medical):   Marland Kitchen Lack of Transportation (Non-Medical):   Physical Activity:   . Days of Exercise per Week:   . Minutes of Exercise per Session:   Stress:   . Feeling of Stress :   Social Connections:   . Frequency of Communication with Friends and Family:   . Frequency of Social Gatherings with Friends and Family:   . Attends Religious Services:   . Active Member of Clubs or Organizations:   . Attends Archivist Meetings:   Marland Kitchen Marital Status:      FAMILY HISTORY:  We obtained a detailed, 4-generation family history.  Significant diagnoses are listed  below: Family History  Problem Relation Age of Onset  . Liver cancer Father   . Hemochromatosis Father   . Liver disease Father   . Diabetes Mother   . Hypertension Mother   . Breast cancer Paternal Aunt 34  . Ovarian cancer Maternal Grandmother        unk primary, metastatic cancer  . Hemachromatosis Daughter   . Liver cancer Paternal Uncle   . Hemochromatosis Paternal Uncle    Danielle Wallace has a son, 52, and twin daughter and son, 87. Patient has 1 full brother and 2 maternal half sisters, 1 half brother, none have had cancer.  Danielle Wallace mother died at 78. Patient's mother was an only child. Maternal grandmother died of metastatic cancer that was believed to be ovarian primary. Maternal grandmother had 2 sisters who also died of cancer, unknown types. Patient does not have information about maternal grandfather.  Danielle Wallace father died of liver cancer and had hemochromatosis. He passed at 21. Patient had 3 paternal aunts, 5 paternal uncles. Two uncles died of liver cancer. Another uncle had cancer of unknown type. An aunt had breast cancer in her 36s-60s. No known cancers in paternal cousins. Paternal grandparents both passed in their  60s-90s.   Danielle Wallace is unaware of previous family history of genetic testing for hereditary cancer risks. Patient's maternal ancestors are of [place] descent, and paternal ancestors are of [place], [place], [place], [place] descent. There is no reported Ashkenazi Jewish ancestry. There is no known consanguinity.  GENETIC COUNSELING ASSESSMENT: Danielle Wallace is a 59 y.o. female with a personal and family history of cancer which is somewhat suggestive of a hereditary cancer syndrome and predisposition to cancer. We, therefore, discussed and recommended the following at today's visit.   DISCUSSION: We discussed that 5 - 10% of breast cancer is hereditary, with most cases associated with BRCA1/BRCA2 mutations.  There are other genes that can be associated with hereditary breast cancer syndromes.  We also discussed that there are genes associated with kidney cancer as well, and that a recent study suggests anywhere from 15-20% of kidney cancer is hereditary. We discussed that testing is beneficial for several reasons including  knowing how to follow individuals for cancer screenings, and understand if other family members could be at risk for cancer and allow them to undergo genetic testing.   We reviewed the characteristics, features and inheritance patterns of hereditary cancer syndromes. We also discussed genetic testing, including the appropriate family members to test, the process of testing, insurance coverage and turn-around-time for results. We discussed the implications of a negative, positive and/or variant of uncertain significant result. We recommended Danielle Wallace pursue genetic testing for the [employer].   The Multi-Cancer Panel offered by [facility] includes sequencing and/or deletion duplication testing of the following 85 genes: AIP, ALK, APC, ATM, AXIN2,BAP1,  BARD1, BLM, BMPR1A, BRCA1, BRCA2, BRIP1, CASR, CDC73, CDH1, CDK4, CDKN1B, CDKN1C, CDKN2A (p14ARF), CDKN2A (p16INK4a),  CEBPA, CHEK2, CTNNA1, DICER1, DIS3L2, EGFR (c.2369C>T, p.Thr790Met variant only), EPCAM (Deletion/duplication testing only), FH, FLCN, GATA2, GPC3, GREM1 (Promoter region deletion/duplication testing only), HOXB13 (c.251G>A, p.Gly84Glu), HRAS, KIT, MAX, MEN1, MET, MITF (c.952G>A, p.Glu318Lys variant only), MLH1, MSH2, MSH3, MSH6, MUTYH, NBN, NF1, NF2, NTHL1, PALB2, PDGFRA, PHOX2B, PMS2, POLD1, POLE, POT1, PRKAR1A, PTCH1, PTEN, RAD50, RAD51C, RAD51D, RB1, RECQL4, RET, RNF43, RUNX1, SDHAF2, SDHA (sequence changes only), SDHB, SDHC, SDHD, SMAD4, SMARCA4, SMARCB1, SMARCE1, STK11, SUFU, TERC, TERT, TMEM127, TP53, TSC1, TSC2, VHL, WRN and WT1.   Based on Ms.  Wallace's personal and family history of cancer, she meets medical criteria for genetic testing. Despite that she meets criteria, she may still have an out of pocket cost. We discussed that if her out of pocket cost for testing is over $100, the laboratory will call and confirm whether she wants to proceed with testing.  If the out of pocket cost of testing is less than $100 she will be billed by the genetic testing laboratory.   PLAN: After considering the risks, benefits, and limitations, Danielle Wallace provided informed consent to pursue genetic testing and the blood sample was sent to [facility] for analysis of the Multi-Cancer Panel. Results should be available within approximately 2-3 weeks' time, at which point they will be disclosed by telephone to Danielle Wallace, as will any additional recommendations warranted by these results. Danielle Wallace will receive a summary of her genetic counseling visit and a copy of her results once available. This information will also be available in Epic.    Danielle Wallace questions were answered to her satisfaction today. Our contact information was provided should additional questions or concerns arise. Thank you for the referral and allowing [place] to share in the care of your patient.   Faith Rogue, MS, [facility] Genetic  Counselor [email or web address] Phone: [phone]  The patient was seen for a total of 35 minutes in face-to-face genetic counseling.  Dr. Grayland Ormond was available for discussion regarding this case.   _______________________________________________________________________ For Office Staff:  Number of people involved in session: 1 Was an Intern/ student involved with case: no

## 2019-11-16 ENCOUNTER — Encounter: Payer: Self-pay | Admitting: Licensed Clinical Social Worker

## 2019-11-16 ENCOUNTER — Other Ambulatory Visit: Payer: Self-pay

## 2019-11-16 ENCOUNTER — Telehealth: Payer: Self-pay | Admitting: Licensed Clinical Social Worker

## 2019-11-16 ENCOUNTER — Ambulatory Visit
Admission: RE | Admit: 2019-11-16 | Discharge: 2019-11-16 | Disposition: A | Discharge status: Home / Self Care | Payer: BC Managed Care – PPO | Source: Ambulatory Visit | Attending: Internal Medicine | Admitting: Internal Medicine

## 2019-11-16 ENCOUNTER — Ambulatory Visit: Payer: Self-pay | Admitting: Licensed Clinical Social Worker

## 2019-11-16 DIAGNOSIS — Z1379 Encounter for other screening for genetic and chromosomal anomalies: Secondary | ICD-10-CM

## 2019-11-16 DIAGNOSIS — N2889 Other specified disorders of kidney and ureter: Secondary | ICD-10-CM | POA: Diagnosis not present

## 2019-11-16 DIAGNOSIS — C50412 Malignant neoplasm of upper-outer quadrant of left female breast: Secondary | ICD-10-CM

## 2019-11-16 DIAGNOSIS — C649 Malignant neoplasm of unspecified kidney, except renal pelvis: Secondary | ICD-10-CM

## 2019-11-16 DIAGNOSIS — C641 Malignant neoplasm of right kidney, except renal pelvis: Secondary | ICD-10-CM

## 2019-11-16 DIAGNOSIS — Z803 Family history of malignant neoplasm of breast: Secondary | ICD-10-CM

## 2019-11-16 DIAGNOSIS — Q600 Renal agenesis, unilateral: Secondary | ICD-10-CM | POA: Diagnosis not present

## 2019-11-16 DIAGNOSIS — Z800 Family history of malignant neoplasm of digestive organs: Secondary | ICD-10-CM

## 2019-11-16 DIAGNOSIS — Z170 Estrogen receptor positive status [ER+]: Secondary | ICD-10-CM

## 2019-11-16 NOTE — Telephone Encounter (Signed)
Revealed negative genetic testing. This normal result is reassuring and indicates that it is unlikely Danielle Wallace's cancer was due to a hereditary cause.  It is unlikely that there is an increased risk of another cancer due to a mutation in one of these genes.  However, genetic testing is not perfect, and cannot definitively rule out a hereditary cause.  It will be important for her to keep in contact with genetics to learn if any additional testing may be needed in the future.

## 2019-11-16 NOTE — Progress Notes (Signed)
HPI:  Ms. Heinrich was previously seen in the [facility] clinic due to a personal and family history of cancer and concerns regarding a hereditary predisposition to cancer. Please refer to our prior cancer genetics clinic note for more information regarding our discussion, assessment and recommendations, at the time. Ms. Oscarson recent genetic test results were disclosed to her, as were recommendations warranted by these results. These results and recommendations are discussed in more detail below.  CANCER HISTORY:  Oncology History Overview Note  # July 2016- 1.carcinoma of breast(left) T1 C. N0 M0 status post ultrasound-guided biopsy Estrogen receptor positive. Progesterone receptor positive.  HER-2/neu amplified by FISH (3.8)  status pos  tnipple sparing mastectomy on the left side with reconstruction (July, 2016) Final staging is  yP1C yNOsn M0  stage 1 c  2.MRSA infection associated with left breast implant (August, 2015) 3.patient started chemotherapy with [facility] from 11th August, 2015. chemotherapy on hold since November because of cellulitis in the chest wall. 4.started on Herceptin May 22, 2014.  Cause of recurrent chest wall  infection chemotherapy was put on hold. 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  Patient is on multiple antibiotic and as per infectious disease specialist similar to stay on antibiotic for 4 months. So chemotherapy has been discontinued. Maintenance Herceptin as well as patient was started on letrozole from June 12, 2014 6.  Port was removed because of Pseudomonas infection of the pocket (August, 2016) 7.  Patient is finishing up Herceptin in August of 2016 now on letrozole;   # FEB 2017- TAMOXIFEN [poor tolerance to AI]   # Jan 2019- [facility]CI- intermediate risk- recommend EXTENDED TAM  # Cirrhosis/ ? 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  Significant diagnoses are listed below: Family History  Problem Relation Age of Onset  . Liver cancer Father   . Hemochromatosis Father   . Liver disease Father   . Diabetes Mother   . Hypertension Mother   . Breast cancer Paternal Aunt 27  . Ovarian cancer Maternal Grandmother        unk primary, metastatic cancer  . Hemachromatosis Daughter   . Liver cancer Paternal Uncle   . Hemochromatosis Paternal Uncle    Ms. Asencio has a son, 99, and twin daughter and son, 33. Patient has 1 full brother and 2 maternal half sisters, 1 half brother, none have had cancer.  Ms. Markwardt mother died at 47. Patient's mother was an only child. Maternal grandmother died of metastatic cancer that was believed to be ovarian primary. Maternal grandmother had 2 sisters who also died of cancer, unknown types. Patient does not have information about maternal grandfather.  Ms. Rosenow father died of liver cancer and had hemochromatosis. He passed at 46. Patient had 3 paternal aunts, 5 paternal uncles. Two uncles died of liver cancer. Another uncle had cancer of unknown type. An aunt had breast cancer in her 60s-60s. No known cancers in paternal cousins. Paternal grandparents both passed in their 44s-90s.   Ms. [place] is unaware of previous family history of genetic testing for hereditary cancer risks.  Patient's maternal ancestors are of [place] descent, and paternal ancestors are of [place], [place], [place], [place] descent. There is no reported Ashkenazi Jewish ancestry. There is no known consanguinity.  GENETIC TEST RESULTS: Genetic testing reported  out on 11/12/2019 through the [facility] Multi- cancer panel found no pathogenic mutations.   The Multi-Cancer Panel offered by [facility] includes sequencing and/or deletion duplication testing of the following 85 genes: AIP, ALK, APC, ATM, AXIN2,BAP1,  BARD1, BLM, BMPR1A, BRCA1, BRCA2, BRIP1, CASR, CDC73, CDH1, CDK4, CDKN1B, CDKN1C, CDKN2A (p14ARF), CDKN2A (p16INK4a), CEBPA, CHEK2, CTNNA1, DICER1, DIS3L2, EGFR (c.2369C>T, p.Thr790Met variant only), EPCAM (Deletion/duplication testing only), FH, FLCN, GATA2, GPC3, GREM1 (Promoter region deletion/duplication testing only), HOXB13 (c.251G>A, p.Gly84Glu), HRAS, KIT, MAX, MEN1, MET, MITF (c.952G>A, p.Glu318Lys variant only), MLH1, MSH2, MSH3, MSH6, MUTYH, NBN, NF1, NF2, NTHL1, PALB2, PDGFRA, PHOX2B, PMS2, POLD1, POLE, POT1, PRKAR1A, PTCH1, PTEN, RAD50, RAD51C, RAD51D, RB1, RECQL4, RET, RNF43, RUNX1, SDHAF2, SDHA (sequence changes only), SDHB, SDHC, SDHD, SMAD4, SMARCA4, SMARCB1, SMARCE1, STK11, SUFU, TERC, TERT, TMEM127, TP53, TSC1, TSC2, VHL, WRN and WT1.   The test report has been scanned into EPIC and is located under the Molecular Pathology section of the Results Review tab.  A portion of the result report is included below for reference.     We discussed with Ms. Cypert that because current genetic testing is not perfect, it is possible there may be a gene mutation in one of these genes that current testing cannot detect, but that chance is small.  We also discussed, that there could be another gene that has not yet been discovered, or that we have not yet tested, that is responsible for the cancer diagnoses in the family. It is also possible there is a hereditary cause for the cancer in the family that Ms. Brunner did not inherit and therefore was not identified in her testing.  Therefore, it is important to remain in touch with cancer genetics in the future so that we can continue to offer Ms. Furlough the most up to date genetic testing.   ADDITIONAL GENETIC  TESTING: We discussed with Ms. Rhue that her genetic testing was fairly extensive.  If there are genes identified to increase cancer risk that can be analyzed in the future, we would be happy to discuss and coordinate this testing at that time.    CANCER SCREENING RECOMMENDATIONS: Ms. Sortino test result is considered negative (normal).  This means that we have not identified a hereditary cause for her  personal and family history of cancer at this time. Most cancers happen by chance and this negative test suggests that her cancer may fall into this category.    While reassuring, this does not definitively rule out a hereditary predisposition to cancer. It is still possible that there could be genetic mutations that are undetectable by current technology. There could be genetic mutations in genes that have not been tested or identified to increase cancer risk.  Therefore, it is recommended she continue to follow the cancer management and screening guidelines provided by her oncology and primary healthcare provider.   An individual's cancer risk and medical management are not determined by genetic test results alone. Overall cancer risk assessment incorporates additional factors, including personal medical history, family history, and any available genetic information that may result in a personalized plan for cancer prevention and surveillance.  RECOMMENDATIONS FOR FAMILY MEMBERS:  Relatives in this family might be at some increased risk of developing cancer, over the general population risk, simply due to the family  history of cancer.  We recommended female relatives in this family have a yearly mammogram beginning at age 79, or 99 years younger than the earliest onset of cancer, an annual clinical breast exam, and perform monthly breast self-exams. Female relatives in this family should also have a gynecological exam as recommended by their primary provider.  All family members should be referred for  colonoscopy starting at age 55.    It is also possible there is a hereditary cause for the cancer in Ms. Arenz's family that she did not inherit and therefore was not identified in her.  Based on Ms. Haecker's family history, we recommended maternal relatives have genetic counseling and testing if it is determined that her grandmother did have ovarian cancer. Ms. Williams will let us know if we can be of any assistance in coordinating genetic counseling and/or testing for these family members.  FOLLOW-UP: Lastly, we discussed with Ms. Riggin that cancer genetics is a rapidly advancing field and it is possible that new genetic tests will be appropriate for her and/or her family members in the future. We encouraged her to remain in contact with cancer genetics on an annual basis so we can update her personal and family histories and let her know of advances in cancer genetics that may benefit this family.   Our contact number was provided. Ms. Sramek questions were answered to her satisfaction, and she knows she is welcome to call us at anytime with additional questions or concerns.   Faith Rogue, MS, [facility] Genetic Counselor [email or web address] Phone: [phone]

## 2019-11-17 ENCOUNTER — Telehealth: Payer: Self-pay | Admitting: Internal Medicine

## 2019-11-17 DIAGNOSIS — Z170 Estrogen receptor positive status [ER+]: Secondary | ICD-10-CM

## 2019-11-17 DIAGNOSIS — C641 Malignant neoplasm of right kidney, except renal pelvis: Secondary | ICD-10-CM

## 2019-11-17 DIAGNOSIS — C50412 Malignant neoplasm of upper-outer quadrant of left female breast: Secondary | ICD-10-CM

## 2019-11-17 NOTE — Telephone Encounter (Signed)
On 7/08- spoke to pt re: results of kidney ultrasound. Will get MRI of kidneys for further work up. Will call with results/plan of care.

## 2019-11-18 NOTE — Telephone Encounter (Signed)
Co[place], Please schedule pt for MRI.

## 2019-11-21 ENCOUNTER — Encounter: Payer: Self-pay | Admitting: Internal Medicine

## 2019-11-22 ENCOUNTER — Ambulatory Visit: Payer: BC Managed Care – PPO

## 2019-11-23 ENCOUNTER — Other Ambulatory Visit: Payer: Self-pay | Admitting: Gastroenterology

## 2019-11-29 ENCOUNTER — Other Ambulatory Visit: Payer: Self-pay

## 2019-11-29 DIAGNOSIS — M159 Polyosteoarthritis, unspecified: Secondary | ICD-10-CM

## 2019-11-29 MED ORDER — ETODOLAC 400 MG PO TABS: ORAL_TABLET | ORAL | 1 refills | Status: DC

## 2019-12-01 NOTE — Telephone Encounter (Signed)
Please advise 

## 2019-12-03 DIAGNOSIS — H524 Presbyopia: Secondary | ICD-10-CM | POA: Diagnosis not present

## 2019-12-03 DIAGNOSIS — E119 Type 2 diabetes mellitus without complications: Secondary | ICD-10-CM | POA: Diagnosis not present

## 2019-12-03 DIAGNOSIS — H25042 Posterior subcapsular polar age-related cataract, left eye: Secondary | ICD-10-CM | POA: Diagnosis not present

## 2019-12-03 DIAGNOSIS — E089 Diabetes mellitus due to underlying condition without complications: Secondary | ICD-10-CM | POA: Diagnosis not present

## 2019-12-13 ENCOUNTER — Other Ambulatory Visit: Payer: Self-pay | Admitting: General Surgery

## 2019-12-13 ENCOUNTER — Other Ambulatory Visit: Payer: Self-pay

## 2019-12-13 DIAGNOSIS — Z85528 Personal history of other malignant neoplasm of kidney: Secondary | ICD-10-CM

## 2019-12-14 ENCOUNTER — Ambulatory Visit: Payer: BC Managed Care – PPO | Admitting: Gastroenterology

## 2019-12-14 ENCOUNTER — Other Ambulatory Visit: Payer: Self-pay

## 2019-12-14 ENCOUNTER — Encounter: Payer: Self-pay | Admitting: Gastroenterology

## 2019-12-14 VITALS — BP 100/64 | HR 66 | Temp 96.4°F | Ht 66.0 in | Wt 216.8 lb

## 2019-12-14 DIAGNOSIS — K7460 Unspecified cirrhosis of liver: Secondary | ICD-10-CM

## 2019-12-14 NOTE — Progress Notes (Signed)
Primary Care Physician: Lavera Guise, MD  Primary Gastroenterologist:  Dr. Lucilla Lame  Chief Complaint  Patient presents with  . Follow up fatty liver disease    HPI: Danielle Wallace is a 59 y.o. very pleasant female here for follow-up of chronic cirrhosis and abnormal liver enzymes. The patient's last ultrasound showed her to have fatty liver. 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 The patient's iron studies and ferritin at that time were normal. Her last ultrasound in September 2020 did not show any masses or lesions. It did show fatty liver versus cirrhosis. The patient has been diagnosed in the past with gastric varices and has been treated with a beta-blocker. She is presently on nadolol. Her most recent lab work showed her alkaline phosphatase to be 126 with her ALT being 55. Her AST was normal. Her previous antimitochondrial antibody was negative as was her antinuclear antigen and smooth muscle antibody. In 2018 the patient had a positive hepatitis A antibody but a negative hepatitis B surface antibody. She has not had a hep B vaccine since then. The patient reports that she continues to have food allergies and has recently gained weight despite trying to lose weight.  She denies any black stools bloody stools or abdominal pain.  Past Medical History:  Diagnosis Date  . Arthritis 2006  . Asthma   . Breast cancer ([place]) 10/14/2013   18 mm, T1c, N0; ER/ PR positive,her 2 neu overexpressed. Adjuvant chemo/ herceptin.  . Cancer [facility]) 2006   Renal cell carcinoma; cryosurgery treatment, right side  . Cirrhosis ([place])   . Collapsed lung 2007  . Diabetes mellitus without complication ([place]) 5361   Metformin  . Diffuse cystic mastopathy   . Family history of breast cancer   . Family history of liver cancer   . Motion sickness    any moving vehicle  . Orthodontics    braces  . Personal history of chemotherapy   . Sinus  problem     Current Outpatient Medications  Medication Sig Dispense Refill  . B COMPLEX-C PO Take by mouth daily.    . Calcium Carb-Cholecalciferol (CALCIUM + D3) 600-200 MG-UNIT TABS Take 1 tablet by mouth 2 (two) times daily. 180 tablet 3  . Calcium Carbonate-Vitamin D 600-200 MG-UNIT TABS Calcium 600 + D(3) 600 mg (1,500 mg)-200 unit tablet  TAKE 1 TABLET BY MOUTH 2 (TWO) TIMES DAILY.    . canagliflozin (INVOKANA) 300 MG TABS tablet Take by mouth.    . diphenhydrAMINE (BENADRYL) 25 MG tablet Take 25 mg by mouth every 6 (six) hours as needed.    Marland Kitchen EPINEPHrine (EPIPEN 2-PAK) 0.3 mg/0.3 mL IJ SOAJ injection Inject 0.3 mLs (0.3 mg total) into the skin as needed. 1 each 1  . etodolac (LODINE) 400 MG tablet TAKE 1 TABLET BY MOUTH  2 TIMES A DAY AS NEEDED FOR PAIN 60 tablet 1  . fluticasone (FLONASE) 50 MCG/ACT nasal spray Place into both nostrils daily.    Marland Kitchen gabapentin (NEURONTIN) 600 MG tablet gabapentin 600 mg tablet  TAKE 1 TABLET BY MOUTH 3 TIMES A DAY    . glipiZIDE (GLUCOTROL) 5 MG tablet Take 1 tablet (5 mg total) by mouth daily before breakfast. 90 tablet 1  . glucose blood (ONETOUCH VERIO) test strip Blood sugar testing done QD and as needed  E11.65 100 each 12  . imipramine (TOFRANIL) 25 MG tablet TAKE 2 TABLETS (  50 MG TOTAL) BY MOUTH AT BEDTIME. 180 tablet 1  . insulin degludec (TRESIBA FLEXTOUCH) 100 UNIT/ML FlexTouch Pen Inject into the skin daily. Take in the evening, raise dosage by 2 units every 3 days until blood sugar is 130 or below.    . nadolol (CORGARD) 80 MG tablet TAKE 1 TABLET (80 MG TOTAL) BY MOUTH DAILY. **PLEASE SCHEDULE 6 MONTH FOLLOW UP APPT** 90 tablet 1  . NOVOTWIST 32G X 5 MM MISC Inject into the skin daily.    . Omega-3 Fatty Acids (FISH OIL OMEGA-3 PO) Take by mouth daily.    . ondansetron (ZOFRAN) 4 MG tablet Take 4 mg by mouth every 8 (eight) hours as needed.    Marland Kitchen oxybutynin (DITROPAN-XL) 5 MG 24 hr tablet Take by mouth.    . pantoprazole (PROTONIX) 40 MG  tablet Take 1 tablet (40 mg total) by mouth daily. 90 tablet 3  . PROAIR HFA 108 (90 Base) MCG/ACT inhaler 2 puffs 2 (two) times daily as needed.    . tamoxifen (NOLVADEX) 10 MG tablet Take 1 tablet (10 mg total) by mouth daily. 30 tablet 6  . valsartan-hydrochlorothiazide (DIOVAN-HCT) 80-12.5 MG tablet Take 1 tablet by mouth daily. 90 tablet 2  . VITAMIN E PO Take by mouth daily.    Marland Kitchen albuterol (PROVENTIL) (2.5 MG/3ML) 0.083% nebulizer solution Take 3 mLs (2.5 mg total) by nebulization every 6 (six) hours as needed for wheezing or shortness of breath. (Patient not taking: Reported on 11/02/2019) 360 mL 3   No current facility-administered medications for this visit.    Allergies as of 12/14/2019 - Review Complete 12/14/2019  Allergen Reaction Noted  . Exemestane Anaphylaxis 06/18/2015  . Floxin [ofloxacin] Anaphylaxis 10/12/2013  . Gluten meal Anaphylaxis 01/13/2017  . Levofloxacin Anaphylaxis, Diarrhea, Itching, Nausea Only, Other (See Comments), Palpitations, Shortness Of Breath, Swelling, and Tinitus   . Linezolid Anaphylaxis 09/25/2014  . Sesame oil Anaphylaxis 11/02/2019  . Taxotere [docetaxel] Anaphylaxis 09/25/2014  . Hydrocodone Itching 10/12/2013  . Invokana [canagliflozin]  10/06/2019  . Metformin and related  10/06/2019  . Sulfa antibiotics Other (See Comments) 12/28/2012  . Codeine Rash 08/09/2014  . Penicillins Rash 10/12/2013  . Vancomycin Rash 08/16/2014    ROS:  General: Negative for anorexia, weight loss, fever, chills, fatigue, weakness. ENT: Negative for hoarseness, difficulty swallowing , nasal congestion. CV: Negative for chest pain, angina, palpitations, dyspnea on exertion, peripheral edema.  Respiratory: Negative for dyspnea at rest, dyspnea on exertion, cough, sputum, wheezing.  GI: See history of present illness. GU:  Negative for dysuria, hematuria, urinary incontinence, urinary frequency, nocturnal urination.  Endo: Negative for unusual weight change.      Physical Examination:   BP 100/64   Pulse 66   Temp (!) 96.4 F (35.8 C) (Temporal)   Ht 5' 6"  (1.676 m)   Wt 216 lb 12.8 oz (98.3 kg)   BMI 34.99 kg/m   General: Well-nourished, well-developed in no acute distress.  Eyes: No icterus. Conjunctivae pink. Lungs: Clear to auscultation bilaterally. Non-labored. Heart: Regular rate and rhythm, no murmurs rubs or gallops.  Abdomen: Bowel sounds are normal, nontender, nondistended, no hepatosplenomegaly or masses, no abdominal bruits or hernia , no rebound or guarding.   Extremities: No lower extremity edema. No clubbing or deformities. Neuro: Alert and oriented x 3.  Grossly intact. Skin: Warm and dry, no jaundice.   Psych: Alert and cooperative, normal mood and affect.  Labs:    Imaging Studies: US RENAL  Result Date: 11/17/2019 CLINICAL  DATA:  59 year old female with a history of right-sided kidney malignancy EXAM: RENAL / URINARY TRACT ULTRASOUND COMPLETE COMPARISON:  CT abdomen.  06/18/2015, ultrasound 02/02/2019, FINDINGS: Right Kidney: Length: 9.7 cm x 4.8 cm x 5.5 cm, 134 cc. No hydronephrosis. Echogenicity is similar to that of the adjacent liver parenchyma. Flow confirmed in the hilum of the right kidney. Heterogeneously hypoechoic mass at the inferior pole of the right kidney. Posterior shadowing present. Estimated diameters 3.7 cm x 3.2 cm x 2.9 cm. The comparison CT of 2017 demonstrates a largest estimated diameter of 2.9 cm. Left Kidney: Length: 12.7 cm x 5.8 cm x 4.8 cm, 184 cc. Echogenicity within normal limits. No mass or hydronephrosis visualized. Bladder: Appears normal for degree of bladder distention. IMPRESSION: The mass at the inferior cortex of the right kidney, representative of the ablated/treated RCC, measures a larger diameter than the most recent cross-sectional imaging dated 06/18/2015. Greatest diameter previously 2.9 cm, greatest diameter currently 3.7 cm. Recurrence/progression cannot be excluded and further  evaluation with either contrast-enhanced MR or CT is recommended No hydronephrosis. Electronically Signed   By: Corrie Mckusick D.O.   On: 11/17/2019 08:40    Assessment and Plan:   Danielle Wallace is a 59 y.o. y/o female who comes in with a history of cirrhosis and had an ultrasound recently with a mass on the right kidney that is being evaluated.  The patient will be set up for a right upper quadrant ultrasound of the liver due to her history of cirrhosis and blood work for alpha-fetoprotein.  She will also continue to try and lose weight.  The patient will continue to follow-up with me for [facility] surveillance.  The patient has been explained the plan agrees with it.      Lucilla Lame, MD. Danielle Wallace    Note: This dictation was prepared with Dragon dictation along with smaller phrase technology. Any transcriptional errors that result from this process are unintentional.

## 2019-12-15 ENCOUNTER — Telehealth: Payer: Self-pay

## 2019-12-15 LAB — AFP TUMOR MARKER: AFP, Serum, Tumor Marker: 4 ng/mL (ref 0.0–8.3)

## 2019-12-15 NOTE — Telephone Encounter (Signed)
-----   Message from Lucilla Lame, MD sent at 12/15/2019 10:34 AM EDT ----- Let the patient know the tumor marker was negative.

## 2019-12-15 NOTE — Telephone Encounter (Signed)
Pt notified of lab result via mychart.

## 2019-12-19 ENCOUNTER — Ambulatory Visit
Admission: RE | Admit: 2019-12-19 | Discharge: 2019-12-19 | Disposition: A | Discharge status: Home / Self Care | Payer: BC Managed Care – PPO | Source: Ambulatory Visit | Attending: Gastroenterology | Admitting: Gastroenterology

## 2019-12-19 ENCOUNTER — Telehealth: Payer: Self-pay

## 2019-12-19 ENCOUNTER — Other Ambulatory Visit: Payer: Self-pay

## 2019-12-19 DIAGNOSIS — K7460 Unspecified cirrhosis of liver: Secondary | ICD-10-CM | POA: Insufficient documentation

## 2019-12-19 DIAGNOSIS — K7689 Other specified diseases of liver: Secondary | ICD-10-CM | POA: Diagnosis not present

## 2019-12-19 NOTE — Telephone Encounter (Signed)
Pt notified of results via mychart.  

## 2019-12-19 NOTE — Telephone Encounter (Signed)
-----   Message from Lucilla Lame, MD sent at 12/19/2019 12:03 PM EDT ----- Let the patient know that the ultrasound did not show any masses or cancers but was consistent with her already established cirrhosis.  She is to have this repeated in 6 months.

## 2019-12-26 ENCOUNTER — Ambulatory Visit
Admission: RE | Admit: 2019-12-26 | Discharge: 2019-12-26 | Disposition: A | Discharge status: Home / Self Care | Payer: BC Managed Care – PPO | Source: Ambulatory Visit | Attending: Internal Medicine | Admitting: Internal Medicine

## 2019-12-26 ENCOUNTER — Other Ambulatory Visit: Payer: Self-pay

## 2019-12-26 DIAGNOSIS — C641 Malignant neoplasm of right kidney, except renal pelvis: Secondary | ICD-10-CM

## 2019-12-26 DIAGNOSIS — K766 Portal hypertension: Secondary | ICD-10-CM | POA: Diagnosis not present

## 2019-12-26 DIAGNOSIS — Z0189 Encounter for other specified special examinations: Secondary | ICD-10-CM | POA: Diagnosis not present

## 2019-12-26 DIAGNOSIS — I700 Atherosclerosis of aorta: Secondary | ICD-10-CM | POA: Diagnosis not present

## 2019-12-26 DIAGNOSIS — I10 Essential (primary) hypertension: Secondary | ICD-10-CM | POA: Diagnosis not present

## 2019-12-26 DIAGNOSIS — K7469 Other cirrhosis of liver: Secondary | ICD-10-CM | POA: Diagnosis not present

## 2019-12-26 MED ORDER — GADOBUTROL 1 MMOL/ML IV SOLN
9.0000 mL | Freq: Once | INTRAVENOUS | Status: AC | PRN
  Administered 2019-12-26: 9 mL via INTRAVENOUS

## 2019-12-27 ENCOUNTER — Encounter: Payer: Self-pay | Admitting: Internal Medicine

## 2019-12-28 ENCOUNTER — Encounter: Payer: Self-pay | Admitting: Nurse Practitioner

## 2019-12-28 ENCOUNTER — Telehealth: Payer: Self-pay | Admitting: Internal Medicine

## 2019-12-28 NOTE — Telephone Encounter (Signed)
Spoke to patient regarding result  of the abdominal MRI-kidney lesion improving; cirrhosis-noted patient/follows up with Dr. Allen Norris.  Mild abdominal adenopathy secondary to cirrhosis reactive-no obvious concerns for malignancy.  Follow-up as planned

## 2020-01-04 DIAGNOSIS — E559 Vitamin D deficiency, unspecified: Secondary | ICD-10-CM | POA: Diagnosis not present

## 2020-01-04 DIAGNOSIS — E1165 Type 2 diabetes mellitus with hyperglycemia: Secondary | ICD-10-CM | POA: Diagnosis not present

## 2020-01-11 ENCOUNTER — Telehealth: Payer: Self-pay | Admitting: *Deleted

## 2020-01-11 DIAGNOSIS — C50412 Malignant neoplasm of upper-outer quadrant of left female breast: Secondary | ICD-10-CM

## 2020-01-11 NOTE — Telephone Encounter (Signed)
-----   Message from Danielle Wallace sent at 01/11/2020  3:42 PM EDT ----- Patient phoned stating that she doesn't feel that she needs the appt later on this month as she was told to come back if her meds aren't working. She said that they are and would like to come at a later time (which would be her regular time). Please call her back at [phone].

## 2020-01-11 NOTE — Telephone Encounter (Signed)
C- as per pt's request-Ok to move the appt to early Jan 2022. - labs- cbc/cmp;AFP. GB

## 2020-01-11 NOTE — Telephone Encounter (Signed)
Patient is doing well and has no complaints at this time.  Patient declines apt on 9/22 and would like to move it out. Dr. Rogue Bussing, please advise on the timing of her future apts.

## 2020-01-12 NOTE — Addendum Note (Signed)
Addended by: Gloris Ham on: 01/12/2020 08:38 AM   Modules accepted: Orders

## 2020-01-12 NOTE — Telephone Encounter (Signed)
Lab orders entered

## 2020-01-13 ENCOUNTER — Encounter: Payer: Self-pay | Admitting: Nurse Practitioner

## 2020-01-13 ENCOUNTER — Ambulatory Visit (INDEPENDENT_AMBULATORY_CARE_PROVIDER_SITE_OTHER): Payer: BC Managed Care – PPO | Admitting: Nurse Practitioner

## 2020-01-13 ENCOUNTER — Other Ambulatory Visit: Payer: Self-pay

## 2020-01-13 VITALS — BP 124/65 | HR 77 | Temp 98.1°F | Resp 16 | Ht 66.0 in | Wt 216.6 lb

## 2020-01-13 DIAGNOSIS — E042 Nontoxic multinodular goiter: Secondary | ICD-10-CM

## 2020-01-13 DIAGNOSIS — E1165 Type 2 diabetes mellitus with hyperglycemia: Secondary | ICD-10-CM

## 2020-01-13 DIAGNOSIS — Z0001 Encounter for general adult medical examination with abnormal findings: Secondary | ICD-10-CM

## 2020-01-13 DIAGNOSIS — I10 Essential (primary) hypertension: Secondary | ICD-10-CM

## 2020-01-13 DIAGNOSIS — M5136 Other intervertebral disc degeneration, lumbar region: Secondary | ICD-10-CM

## 2020-01-13 DIAGNOSIS — M170 Bilateral primary osteoarthritis of knee: Secondary | ICD-10-CM

## 2020-01-13 NOTE — Progress Notes (Signed)
[facility] [place], [place] [address]  Internal MEDICINE  Office Visit Note  Patient Name: Danielle Wallace  [id number]  [id number]  Date of Service: 01/31/2020  Chief Complaint  Patient presents with  . Annual Exam    knee pain  . Diabetes  . Quality Metric Gaps    HIV screening, TDAP, foot exam, flu shot     The patient is here for health maintenance exam. Today, she is having joint pain. This started in the right knee, around the lateral and medial aspect of the knee cap. She states that it got so bad, she was unable to bend the knee at all. Hurts going up and down the stairs. Also having pain, but to a lesser degree, in right knee. Yesterday, she was able to rest, ice, and elevate the right knee. Is slightly better today. She states that she had swelling all around the knee, veins above the knee were very swollen and enlarged. Today, this is somewhat improved.  She states that she is also having weakness in both legs. She does take etodolac as needed for pain. She states that without this medication, she would be unable to function.  She has seen her endocrinologist this past week. HbA1c was very elevated. She was started on regular insulin for mealtime coverage. She has not started on this yet as she needed a different prescription for pen needles. Endocrinology does not monitor patient's thyroid.   Pt is here for routine health maintenance examination  Current Medication: Outpatient Encounter Medications as of 01/13/2020  Medication Sig  . B COMPLEX-C PO Take by mouth daily.  . Calcium Carb-Cholecalciferol (CALCIUM + D3) 600-200 MG-UNIT TABS Take 1 tablet by mouth 2 (two) times daily.  . Calcium Carbonate-Vitamin D 600-200 MG-UNIT TABS Calcium 600 + D(3) 600 mg (1,500 mg)-200 unit tablet  TAKE 1 TABLET BY MOUTH 2 (TWO) TIMES DAILY.  . diphenhydrAMINE (BENADRYL) 25 MG tablet Take 25 mg by mouth every 6 (six) hours as needed.  Marland Kitchen EPINEPHrine (EPIPEN  2-PAK) 0.3 mg/0.3 mL IJ SOAJ injection Inject 0.3 mLs (0.3 mg total) into the skin as needed.  . etodolac (LODINE) 400 MG tablet TAKE 1 TABLET BY MOUTH  2 TIMES A DAY AS NEEDED FOR PAIN  . fluticasone (FLONASE) 50 MCG/ACT nasal spray Place into both nostrils daily.  Marland Kitchen glipiZIDE (GLUCOTROL) 5 MG tablet Take 1 tablet (5 mg total) by mouth daily before breakfast.  . glucose blood (ONETOUCH VERIO) test strip Blood sugar testing done QD and as needed  E11.65  . imipramine (TOFRANIL) 25 MG tablet TAKE 2 TABLETS (50 MG TOTAL) BY MOUTH AT BEDTIME.  . insulin degludec (TRESIBA FLEXTOUCH) 100 UNIT/ML FlexTouch Pen Inject into the skin daily. Take in the evening, raise dosage by 2 units every 3 days until blood sugar is 130 or below.  . insulin lispro (HUMALOG) 100 UNIT/ML KwikPen Inject 10 Units into the skin 2 (two) times daily before a meal.  . nadolol (CORGARD) 80 MG tablet TAKE 1 TABLET (80 MG TOTAL) BY MOUTH DAILY. **PLEASE SCHEDULE 6 MONTH FOLLOW UP APPT**  . NOVOTWIST 32G X 5 MM MISC Inject into the skin daily.  . Omega-3 Fatty Acids (FISH OIL OMEGA-3 PO) Take by mouth daily.  . pantoprazole (PROTONIX) 40 MG tablet Take 1 tablet (40 mg total) by mouth daily.  . pravastatin (PRAVACHOL) 20 MG tablet Take 20 mg by mouth daily.  Marland Kitchen PROAIR HFA 108 (90 Base) MCG/ACT inhaler 2 puffs  2 (two) times daily as needed.  . tamoxifen (NOLVADEX) 10 MG tablet Take 1 tablet (10 mg total) by mouth daily.  . valsartan-hydrochlorothiazide (DIOVAN-HCT) 80-12.5 MG tablet Take 1 tablet by mouth daily.  Marland Kitchen VITAMIN E PO Take by mouth daily.  . [DISCONTINUED] albuterol (PROVENTIL) (2.5 MG/3ML) 0.083% nebulizer solution Take 3 mLs (2.5 mg total) by nebulization every 6 (six) hours as needed for wheezing or shortness of breath. (Patient not taking: Reported on 11/02/2019)  . [DISCONTINUED] canagliflozin (INVOKANA) 300 MG TABS tablet Take by mouth. (Patient not taking: Reported on 01/13/2020)  . [DISCONTINUED] gabapentin (NEURONTIN)  600 MG tablet gabapentin 600 mg tablet  TAKE 1 TABLET BY MOUTH 3 TIMES A DAY (Patient not taking: Reported on 01/13/2020)  . [DISCONTINUED] ondansetron (ZOFRAN) 4 MG tablet Take 4 mg by mouth every 8 (eight) hours as needed. (Patient not taking: Reported on 01/13/2020)  . [DISCONTINUED] oxybutynin (DITROPAN-XL) 5 MG 24 hr tablet Take by mouth. (Patient not taking: Reported on 01/13/2020)   No facility-administered encounter medications on file as of 01/13/2020.    Surgical History: Past Surgical History:  Procedure Laterality Date  . BREAST BIOPSY Right 2005   negative  . BREAST BIOPSY Left 08/23/2007   negative  . BREAST BIOPSY Left 10/24/2013   positive  . BREAST BIOPSY Left 07/2002   neg  . BREAST BIOPSY Left 08/23/2007   neg  . BREAST SURGERY Left 10/2011   Cyst Aspirationapocrine metaplasia, ductal cells and bone cells, hypo-cellular  . BREAST SURGERY Left 2009   ADH on stereotactic biopsy, 1.5 mm focus.  Marland Kitchen BREAST SURGERY Left 2003   fibrocystic changes with ductal hyperplasia without atypia.  Marland Kitchen BREAST SURGERY Left    mastectomy  . BREAST SURGERY Left April 11, 2014   Removal of implant, debridement chest wall Dr.Coan  . CESAREAN SECTION  1991  . CHOLECYSTECTOMY  1990  . COLONOSCOPY  08/26/2013   Verdie Shire, M.D. normal.  . CRYOABLATION  2005, 2010  . CYST REMOVAL NECK  10/2011   Dr. Tami Ribas  . ESOPHAGOGASTRODUODENOSCOPY (EGD) WITH PROPOFOL N/A 01/16/2017   Procedure: ESOPHAGOGASTRODUODENOSCOPY (EGD) WITH PROPOFOL;  Surgeon: Lucilla Lame, MD;  Location: [place];  Service: Gastroenterology;  Laterality: N/A;  Diabetic - oral meds  . INCISION AND DRAINAGE Left Dec 2015, Feb 2016   Dr Tula Nakayama  . MASTECTOMY Left 11/24/2013   positive  . PORTACATH PLACEMENT  11/24/13    Medical History: Past Medical History:  Diagnosis Date  . Arthritis 2006  . Asthma   . Breast cancer ([place]) 10/14/2013   18 mm, T1c, N0; ER/ PR positive,her 2 neu overexpressed. Adjuvant chemo/ herceptin.  .  Cancer [facility]) 2006   Renal cell carcinoma; cryosurgery treatment, right side  . Cirrhosis ([place])   . Collapsed lung 2007  . Diabetes mellitus without complication ([place]) 2683   Metformin  . Diffuse cystic mastopathy   . Family history of breast cancer   . Family history of liver cancer   . Motion sickness    any moving vehicle  . Orthodontics    braces  . Personal history of chemotherapy   . Sinus problem     Family History: Family History  Problem Relation Age of Onset  . Liver cancer Father   . Hemochromatosis Father   . Liver disease Father   . Diabetes Mother   . Hypertension Mother   . Breast cancer Paternal Aunt 74  . Ovarian cancer Maternal Grandmother  unk primary, metastatic cancer  . Hemachromatosis Daughter   . Liver cancer Paternal Uncle   . Hemochromatosis Paternal Uncle       Review of Systems  Constitutional: Positive for fatigue. Negative for chills and unexpected weight change.  HENT: Positive for postnasal drip. Negative for congestion, rhinorrhea and sore throat.        Chronic allergy symptoms. New allergic symptoms related to eating. Food allergen profile done in 07/2019 shwoing only mild allergy to sesame seeds.   Respiratory: Negative for cough, chest tightness, shortness of breath and wheezing.   Cardiovascular: Negative for chest pain and palpitations.  Gastrointestinal: Negative for abdominal pain, constipation, diarrhea, nausea and vomiting.  Endocrine: Negative for cold intolerance, heat intolerance, polydipsia and polyuria.       Blood sugars doing well   Musculoskeletal: Positive for arthralgias. Negative for back pain, joint swelling and neck pain.       Bilateral knee pain. Hurting a great deal when going up and down the stairs. Hurts to flex and/or extend the legs. Also hurts when applying weight to both legs.   Skin: Negative for rash.  Allergic/Immunologic: Positive for environmental allergies. Negative for food allergies.   Neurological: Positive for headaches. Negative for dizziness, tremors and numbness.  Hematological: Negative for adenopathy. Does not bruise/bleed easily.  Psychiatric/Behavioral: Negative for behavioral problems (Depression), sleep disturbance and suicidal ideas. The patient is not nervous/anxious.      Today's Vitals   01/13/20 1356  BP: 124/65  Pulse: 77  Resp: 16  Temp: 98.1 F (36.7 C)  SpO2: 97%  Weight: 216 lb 9.6 oz (98.2 kg)  Height: 5' 6"  (1.676 m)   Body mass index is 34.96 kg/m.  Physical Exam Vitals and nursing note reviewed.  Constitutional:      General: She is not in acute distress.    Appearance: Normal appearance. She is well-developed. She is not diaphoretic.  HENT:     Head: Normocephalic and atraumatic.     Nose: Nose normal.     Mouth/Throat:     Pharynx: No oropharyngeal exudate.  Eyes:     Conjunctiva/sclera: Conjunctivae normal.     Pupils: Pupils are equal, round, and reactive to light.  Neck:     Thyroid: No thyromegaly.     Vascular: No carotid bruit or JVD.     Trachea: No tracheal deviation.     Comments: Mild thyromegaly present.  Cardiovascular:     Rate and Rhythm: Normal rate and regular rhythm.     Pulses: Normal pulses.     Heart sounds: Normal heart sounds. No murmur heard.  No friction rub. No gallop.   Pulmonary:     Effort: Pulmonary effort is normal. No respiratory distress.     Breath sounds: Normal breath sounds. No wheezing or rales.  Chest:     Chest wall: No tenderness.  Abdominal:     General: Bowel sounds are normal.     Palpations: Abdomen is soft.     Tenderness: There is no abdominal tenderness.  Musculoskeletal:        General: Normal range of motion.     Cervical back: Normal range of motion and neck supple.     Comments: There is bilateral knee pain with mild swelling present. Flexion and extension of the knees causes increased pain. Crepitus can be felt with movement.   Lymphadenopathy:     Cervical: No  cervical adenopathy.  Skin:    General: Skin is warm and dry.  Neurological:     General: No focal deficit present.     Mental Status: She is alert and oriented to person, place, and time.     Cranial Nerves: No cranial nerve deficit.  Psychiatric:        Mood and Affect: Mood normal.        Behavior: Behavior normal.        Thought Content: Thought content normal.        Judgment: Judgment normal.      LABS: Recent Results (from the past 2160 hour(s))  AFP tumor marker     Status: None   Collection Time: 12/14/19 11:03 AM  Result Value Ref Range   AFP, Serum, Tumor Marker 4.0 0.0 - 8.3 ng/mL    Comment: Roche [employer] Electrochemiluminescence Immunoassay (ECLIA) Values obtained with different assay methods or kits cannot be used interchangeably.  Results cannot be interpreted as absolute evidence of the presence or absence of malignant disease. This test is not interpretable in pregnant females.   Comprehensive metabolic panel     Status: Abnormal   Collection Time: 01/18/20  8:51 AM  Result Value Ref Range   Glucose 162 (H) 65 - 99 mg/dL   BUN 14 8 - 27 mg/dL   Creatinine, Ser 0.64 0.57 - 1.00 mg/dL   GFR calc non Af Amer 97 >59 mL/min/1.73   GFR calc Af Amer 112 >59 mL/min/1.73    Comment: **[facility] currently reports eGFR in compliance with the current**   recommendations of the [employer]. [facility] will   update reporting as new guidelines are published from the NKF-ASN   Task force.    BUN/Creatinine Ratio 22 12 - 28   Sodium 143 134 - 144 mmol/L   Potassium 4.5 3.5 - 5.2 mmol/L   Chloride 104 96 - 106 mmol/L   CO2 28 20 - 29 mmol/L   Calcium 8.6 (L) 8.7 - 10.3 mg/dL   Total Protein 6.5 6.0 - 8.5 g/dL   Albumin 3.8 3.8 - 4.9 g/dL   Globulin, Total 2.7 1.5 - 4.5 g/dL   Albumin/Globulin Ratio 1.4 1.2 - 2.2   Bilirubin Total 0.5 0.0 - 1.2 mg/dL   Alkaline Phosphatase 121 48 - 121 IU/L    Comment: **Effective January 23, 2020 Alkaline  Phosphatase**   reference interval will be changing to:              Age                Female          Female           0 -  5 days         6 - 127       71 - 127           6 - 10 days         97 - 242       89 - 84          11 - 20 days        109 - 357      109 - 357          21 - 30 days         47 - 865       78 - 494           1 -  2 months      149 - 28  149 - 539           3 -  6 months      131 - 452      131 - 452           7 - 11 months      117 - 401      117 - 401   12 months -  6 years       158 - 369      158 - 369           7 - 12 years       150 - 409      150 - 409               13 years       156 - 435       78 - 227               14 years       114 - 375       41 - 161               15 years        7 - 279       67 - 134               16 years        84 - 207       71 - 121               17 years        53 - 161       70 - 113          68 - 20 years        55 - 125       70 - 106              >20 years         15 - 121       44 - 121    AST 29 0 - 40 IU/L   ALT 31 0 - 32 IU/L  CBC     Status: None   Collection Time: 01/18/20  8:51 AM  Result Value Ref Range   WBC 6.6 3.4 - 10.8 x10E3/uL   RBC 4.31 3.77 - 5.28 x10E6/uL   Hemoglobin 13.0 11.1 - 15.9 g/dL   Hematocrit 40.1 34.0 - 46.6 %   MCV 93 79 - 97 fL   MCH 30.2 26.6 - 33.0 pg   MCHC 32.4 31 - 35 g/dL   RDW 12.2 11.7 - 15.4 %   Platelets 175 150 - 450 x10E3/uL  Lipid Panel With LDL/HDL Ratio     Status: None   Collection Time: 01/18/20  8:51 AM  Result Value Ref Range   Cholesterol, Total 129 100 - 199 mg/dL   Triglycerides 99 0 - 149 mg/dL   HDL 40 >39 mg/dL   VLDL Cholesterol Cal 19 5 - 40 mg/dL   LDL Chol Calc (NIH) 70 0 - 99 mg/dL   LDL/HDL Ratio 1.8 0.0 - 3.2 ratio    Comment:                                     LDL/HDL Ratio  Men  Women                               1/2 Avg.Risk  1.0    1.5                                   Avg.Risk  3.6     3.2                                2X Avg.Risk  6.2    5.0                                3X Avg.Risk  8.0    6.1   Iron and TIBC     Status: None   Collection Time: 01/18/20  8:51 AM  Result Value Ref Range   Total Iron Binding Capacity 326 250 - 450 ug/dL   UIBC 224 131 - 425 ug/dL   Iron 102 27 - 159 ug/dL   Iron Saturation 31 15 - 55 %  B12 and Folate Panel     Status: None   Collection Time: 01/18/20  8:51 AM  Result Value Ref Range   Vitamin B-12 370 232 - 1,245 pg/mL   Folate 8.1 >3.0 ng/mL    Comment: A serum folate concentration of less than 3.1 ng/mL is considered to represent clinical deficiency.   T4, free     Status: None   Collection Time: 01/18/20  8:51 AM  Result Value Ref Range   Free T4 1.17 0.82 - 1.77 ng/dL  TSH     Status: None   Collection Time: 01/18/20  8:51 AM  Result Value Ref Range   TSH 2.580 0.450 - 4.500 uIU/mL  VITAMIN D 25 Hydroxy (Vit-D Deficiency, Fractures)     Status: Abnormal   Collection Time: 01/18/20  8:51 AM  Result Value Ref Range   Vit D, 25-Hydroxy 28.8 (L) 30.0 - 100.0 ng/mL    Comment: Vitamin D deficiency has been defined by the [place] practice guideline as a level of serum 25-OH vitamin D less than 20 ng/mL (1,2). The Endocrine Society went on to further define vitamin D insufficiency as a level between 21 and 29 ng/mL (2). 1. IOM (Institute of Medicine). 2010. Dietary reference    intakes for calcium and D. [place]: The    [employer]. 2. Holick MF, Binkley [place], Bischoff-Ferrari HA, et al.    Evaluation, treatment, and prevention of vitamin D    deficiency: an Endocrine Society clinical practice    guideline. JCEM. 2011 Jul; 96(7):1911-30.   ANA w/Reflex     Status: None   Collection Time: 01/18/20  8:51 AM  Result Value Ref Range   Anti Nuclear Antibody (ANA) Negative Negative  Ferritin     Status: None   Collection Time: 01/18/20  8:51 AM  Result Value Ref  Range   Ferritin 121 15.0 - 150.0 ng/mL    Assessment/Plan: 1. Encounter for general adult medical examination with abnormal findings Annual health maintenance exam today.   2. Type 2 diabetes mellitus with hyperglycemia, without long-term current use of insulin ([place]) Generally well managed. Now seeing endocrinology for maintenance of diabetes. Continue  routien visits as scheduled.   3. Essential hypertension Stable. Continue bp medication as prescribed   4. Primary osteoarthritis of both knees Rest, ice, and elevate the knees when possible. Use ACE bandage or knee sleeve to give support to the knees. Will get X-rays of both knees for further evaluation. Will refer to orthopedics as indicated.  - DG Knee 1-2 Views Right; Future - DG Knee 1-2 Views Left; Future  5. Degenerative disc disease, lumbar Will get x-ray of lumbar spine for further evaluation.  - DG Lumbar Spine Complete; Future  6. Multinodular goiter Will continue to monitor.   General Counseling: keaunna skipper understanding of the findings of todays visit and agrees with plan of treatment. I have discussed any further diagnostic evaluation that may be needed or ordered today. We also reviewed her medications today. she has been encouraged to call the office with any questions or concerns that should arise related to todays visit.    Counseling:  This patient was seen by Leretha Pol FNP Collaboration with Dr Lavera Guise as a part of collaborative care agreement  Orders Placed This Encounter  Procedures  . DG Knee 1-2 Views Right  . DG Knee 1-2 Views Left  . DG Lumbar Spine Complete     Total time spent: 45 Minutes  Time spent includes review of chart, medications, test results, and follow up plan with the patient.     Lavera Guise, MD  Internal Medicine

## 2020-01-18 ENCOUNTER — Other Ambulatory Visit: Payer: Self-pay | Admitting: Nurse Practitioner

## 2020-01-18 ENCOUNTER — Other Ambulatory Visit: Payer: Self-pay

## 2020-01-18 ENCOUNTER — Ambulatory Visit
Admission: RE | Admit: 2020-01-18 | Discharge: 2020-01-18 | Disposition: A | Discharge status: Home / Self Care | Payer: BC Managed Care – PPO | Source: Ambulatory Visit | Attending: Nurse Practitioner | Admitting: Nurse Practitioner

## 2020-01-18 ENCOUNTER — Telehealth: Payer: Self-pay

## 2020-01-18 ENCOUNTER — Ambulatory Visit
Admission: RE | Admit: 2020-01-18 | Discharge: 2020-01-18 | Disposition: A | Discharge status: Home / Self Care | Payer: BC Managed Care – PPO | Attending: Nurse Practitioner | Admitting: Nurse Practitioner

## 2020-01-18 DIAGNOSIS — M170 Bilateral primary osteoarthritis of knee: Secondary | ICD-10-CM

## 2020-01-18 DIAGNOSIS — M1712 Unilateral primary osteoarthritis, left knee: Secondary | ICD-10-CM | POA: Diagnosis not present

## 2020-01-18 DIAGNOSIS — R29898 Other symptoms and signs involving the musculoskeletal system: Secondary | ICD-10-CM | POA: Diagnosis not present

## 2020-01-18 DIAGNOSIS — M545 Low back pain: Secondary | ICD-10-CM | POA: Diagnosis not present

## 2020-01-18 DIAGNOSIS — Z0001 Encounter for general adult medical examination with abnormal findings: Secondary | ICD-10-CM | POA: Diagnosis not present

## 2020-01-18 DIAGNOSIS — M5136 Other intervertebral disc degeneration, lumbar region: Secondary | ICD-10-CM | POA: Diagnosis not present

## 2020-01-18 DIAGNOSIS — M064 Inflammatory polyarthropathy: Secondary | ICD-10-CM | POA: Diagnosis not present

## 2020-01-18 DIAGNOSIS — M7989 Other specified soft tissue disorders: Secondary | ICD-10-CM | POA: Diagnosis not present

## 2020-01-18 DIAGNOSIS — D510 Vitamin B12 deficiency anemia due to intrinsic factor deficiency: Secondary | ICD-10-CM | POA: Diagnosis not present

## 2020-01-18 DIAGNOSIS — M76892 Other specified enthesopathies of left lower limb, excluding foot: Secondary | ICD-10-CM | POA: Diagnosis not present

## 2020-01-18 DIAGNOSIS — M1711 Unilateral primary osteoarthritis, right knee: Secondary | ICD-10-CM | POA: Diagnosis not present

## 2020-01-18 DIAGNOSIS — M25461 Effusion, right knee: Secondary | ICD-10-CM | POA: Diagnosis not present

## 2020-01-18 DIAGNOSIS — E039 Hypothyroidism, unspecified: Secondary | ICD-10-CM | POA: Diagnosis not present

## 2020-01-19 ENCOUNTER — Encounter: Payer: Self-pay | Admitting: Nurse Practitioner

## 2020-01-19 ENCOUNTER — Other Ambulatory Visit: Payer: Self-pay | Admitting: Nurse Practitioner

## 2020-01-19 DIAGNOSIS — M1712 Unilateral primary osteoarthritis, left knee: Secondary | ICD-10-CM

## 2020-01-19 LAB — COMPREHENSIVE METABOLIC PANEL
ALT: 31 IU/L (ref 0–32)
AST: 29 IU/L (ref 0–40)
Albumin/Globulin Ratio: 1.4 (ref 1.2–2.2)
Albumin: 3.8 g/dL (ref 3.8–4.9)
Alkaline Phosphatase: 121 IU/L (ref 48–121)
BUN/Creatinine Ratio: 22 (ref 12–28)
BUN: 14 mg/dL (ref 8–27)
Bilirubin Total: 0.5 mg/dL (ref 0.0–1.2)
CO2: 28 mmol/L (ref 20–29)
Calcium: 8.6 mg/dL — ABNORMAL LOW (ref 8.7–10.3)
Chloride: 104 mmol/L (ref 96–106)
Creatinine, Ser: 0.64 mg/dL (ref 0.57–1.00)
GFR calc Af Amer: 112 mL/min/{1.73_m2} (ref >59)
GFR calc non Af Amer: 97 mL/min/{1.73_m2} (ref >59)
Globulin, Total: 2.7 g/dL (ref 1.5–4.5)
Glucose: 162 mg/dL — ABNORMAL HIGH (ref 65–99)
Potassium: 4.5 mmol/L (ref 3.5–5.2)
Sodium: 143 mmol/L (ref 134–144)
Total Protein: 6.5 g/dL (ref 6.0–8.5)

## 2020-01-19 LAB — LIPID PANEL WITH LDL/HDL RATIO
Cholesterol, Total: 129 mg/dL (ref 100–199)
HDL: 40 mg/dL (ref >39)
LDL Chol Calc (NIH): 70 mg/dL (ref 0–99)
LDL/HDL Ratio: 1.8 ratio (ref 0.0–3.2)
Triglycerides: 99 mg/dL (ref 0–149)
VLDL Cholesterol Cal: 19 mg/dL (ref 5–40)

## 2020-01-19 LAB — CBC
Hematocrit: 40.1 % (ref 34.0–46.6)
Hemoglobin: 13 g/dL (ref 11.1–15.9)
MCH: 30.2 pg (ref 26.6–33.0)
MCHC: 32.4 g/dL (ref 31.5–35.7)
MCV: 93 fL (ref 79–97)
Platelets: 175 10*3/uL (ref 150–450)
RBC: 4.31 x10E6/uL (ref 3.77–5.28)
RDW: 12.2 % (ref 11.7–15.4)
WBC: 6.6 10*3/uL (ref 3.4–10.8)

## 2020-01-19 LAB — VITAMIN D 25 HYDROXY (VIT D DEFICIENCY, FRACTURES): Vit D, 25-Hydroxy: 28.8 ng/mL — ABNORMAL LOW (ref 30.0–100.0)

## 2020-01-19 LAB — IRON AND TIBC
Iron Saturation: 31 % (ref 15–55)
Iron: 102 ug/dL (ref 27–159)
Total Iron Binding Capacity: 326 ug/dL (ref 250–450)
UIBC: 224 ug/dL (ref 131–425)

## 2020-01-19 LAB — TSH: TSH: 2.58 u[IU]/mL (ref 0.450–4.500)

## 2020-01-19 LAB — ANA W/REFLEX: Anti Nuclear Antibody (ANA): NEGATIVE

## 2020-01-19 LAB — B12 AND FOLATE PANEL
Folate: 8.1 ng/mL (ref >3.0)
Vitamin B-12: 370 pg/mL (ref 232–1245)

## 2020-01-19 LAB — T4, FREE: Free T4: 1.17 ng/dL (ref 0.82–1.77)

## 2020-01-19 LAB — FERRITIN: Ferritin: 121 ng/mL (ref 15–150)

## 2020-01-19 NOTE — Telephone Encounter (Signed)
Hey. I just placed order for MRI of left knee for further evaluation.

## 2020-01-23 ENCOUNTER — Other Ambulatory Visit: Payer: Self-pay

## 2020-01-23 MED ORDER — LINACLOTIDE 145 MCG PO CAPS: 145.0000 ug | ORAL_CAPSULE | Freq: Every day | ORAL | 2 refills | Status: DC

## 2020-01-25 ENCOUNTER — Other Ambulatory Visit: Payer: Self-pay

## 2020-01-25 ENCOUNTER — Ambulatory Visit
Admission: RE | Admit: 2020-01-25 | Discharge: 2020-01-25 | Disposition: A | Discharge status: Home / Self Care | Payer: BC Managed Care – PPO | Source: Ambulatory Visit | Attending: General Surgery | Admitting: General Surgery

## 2020-01-25 DIAGNOSIS — N6489 Other specified disorders of breast: Secondary | ICD-10-CM | POA: Diagnosis not present

## 2020-01-25 DIAGNOSIS — Z1231 Encounter for screening mammogram for malignant neoplasm of breast: Secondary | ICD-10-CM | POA: Insufficient documentation

## 2020-01-25 DIAGNOSIS — Z85528 Personal history of other malignant neoplasm of kidney: Secondary | ICD-10-CM | POA: Diagnosis not present

## 2020-01-26 ENCOUNTER — Other Ambulatory Visit: Payer: Self-pay | Admitting: General Surgery

## 2020-01-26 DIAGNOSIS — R928 Other abnormal and inconclusive findings on diagnostic imaging of breast: Secondary | ICD-10-CM

## 2020-01-26 DIAGNOSIS — N6489 Other specified disorders of breast: Secondary | ICD-10-CM

## 2020-01-31 DIAGNOSIS — Z0001 Encounter for general adult medical examination with abnormal findings: Secondary | ICD-10-CM | POA: Insufficient documentation

## 2020-01-31 DIAGNOSIS — M170 Bilateral primary osteoarthritis of knee: Secondary | ICD-10-CM | POA: Insufficient documentation

## 2020-01-31 DIAGNOSIS — M5136 Other intervertebral disc degeneration, lumbar region: Secondary | ICD-10-CM | POA: Insufficient documentation

## 2020-02-01 ENCOUNTER — Ambulatory Visit: Payer: BC Managed Care – PPO | Admitting: Internal Medicine

## 2020-02-02 DIAGNOSIS — R928 Other abnormal and inconclusive findings on diagnostic imaging of breast: Secondary | ICD-10-CM | POA: Diagnosis not present

## 2020-02-02 DIAGNOSIS — Z853 Personal history of malignant neoplasm of breast: Secondary | ICD-10-CM | POA: Diagnosis not present

## 2020-02-06 ENCOUNTER — Ambulatory Visit
Admission: RE | Admit: 2020-02-06 | Discharge: 2020-02-06 | Disposition: A | Discharge status: Home / Self Care | Payer: BC Managed Care – PPO | Source: Ambulatory Visit | Attending: General Surgery | Admitting: General Surgery

## 2020-02-06 ENCOUNTER — Other Ambulatory Visit: Payer: Self-pay

## 2020-02-06 ENCOUNTER — Ambulatory Visit
Admission: RE | Admit: 2020-02-06 | Discharge: 2020-02-06 | Disposition: A | Discharge status: Home / Self Care | Payer: BC Managed Care – PPO | Source: Ambulatory Visit | Attending: Nurse Practitioner | Admitting: Nurse Practitioner

## 2020-02-06 DIAGNOSIS — R928 Other abnormal and inconclusive findings on diagnostic imaging of breast: Secondary | ICD-10-CM | POA: Diagnosis not present

## 2020-02-06 DIAGNOSIS — N6489 Other specified disorders of breast: Secondary | ICD-10-CM

## 2020-02-06 DIAGNOSIS — M1712 Unilateral primary osteoarthritis, left knee: Secondary | ICD-10-CM | POA: Diagnosis not present

## 2020-02-06 DIAGNOSIS — R922 Inconclusive mammogram: Secondary | ICD-10-CM | POA: Diagnosis not present

## 2020-02-06 DIAGNOSIS — M23321 Other meniscus derangements, posterior horn of medial meniscus, right knee: Secondary | ICD-10-CM | POA: Diagnosis not present

## 2020-02-16 ENCOUNTER — Encounter: Payer: Self-pay | Admitting: Nurse Practitioner

## 2020-02-16 ENCOUNTER — Ambulatory Visit: Payer: BC Managed Care – PPO | Admitting: Nurse Practitioner

## 2020-02-16 ENCOUNTER — Other Ambulatory Visit: Payer: Self-pay | Admitting: General Surgery

## 2020-02-16 VITALS — BP 146/81 | HR 84 | Temp 97.5°F | Resp 16 | Ht 66.0 in | Wt 219.0 lb

## 2020-02-16 DIAGNOSIS — I700 Atherosclerosis of aorta: Secondary | ICD-10-CM | POA: Diagnosis not present

## 2020-02-16 DIAGNOSIS — E042 Nontoxic multinodular goiter: Secondary | ICD-10-CM

## 2020-02-16 DIAGNOSIS — N6311 Unspecified lump in the right breast, upper outer quadrant: Secondary | ICD-10-CM | POA: Diagnosis not present

## 2020-02-16 DIAGNOSIS — I10 Essential (primary) hypertension: Secondary | ICD-10-CM | POA: Diagnosis not present

## 2020-02-16 DIAGNOSIS — E1165 Type 2 diabetes mellitus with hyperglycemia: Secondary | ICD-10-CM

## 2020-02-16 DIAGNOSIS — Z794 Long term (current) use of insulin: Secondary | ICD-10-CM | POA: Diagnosis not present

## 2020-02-16 DIAGNOSIS — N6312 Unspecified lump in the right breast, upper inner quadrant: Secondary | ICD-10-CM | POA: Diagnosis not present

## 2020-02-16 DIAGNOSIS — S83231D Complex tear of medial meniscus, current injury, right knee, subsequent encounter: Secondary | ICD-10-CM

## 2020-02-16 LAB — POCT GLYCOSYLATED HEMOGLOBIN (HGB A1C): Hemoglobin A1C: 7 % — AB (ref 4.0–5.6)

## 2020-02-16 NOTE — Progress Notes (Signed)
[facility] [place], [place] [address]  Internal MEDICINE  Office Visit Note  Patient Name: Danielle Wallace  [id number]  [id number]  Date of Service: 03/04/2020  Chief Complaint  Patient presents with  . Follow-up    MRI  . Quality Metric Gaps    Flu,tetnaus  . Diabetes  . other    controlled substance form given to PT    The patient is here for follow up of bilateral knee pain, worse on the right side than the left. having joint pain. This started in the right knee, around the lateral and medial aspect of the knee cap. She states that it got so bad, she was unable to bend the knee at all. Hurts going up and down the stairs. An x-ray of the right knee did show A 14 mm lucency within the central superior aspect of the patella may represent a subchondral cyst, however is nonspecific, and greater than expected for the degree of degenerative change which ismild. MRI of the right knee shows Complex tearing posterior horn and posterior body of the medial meniscus. Osteoarthritis about the knee is worst in the patellofemoral compartment. Subchondral cyst formation accounts for the appearance of the patella on plain films. Ruptured Baker's cyst with a moderately large volume of fluid dissecting superficial to the medial gastrocnemius. On x-ray of the lumbar spine donein 01/2020, aortic atherosclerosis was present. She should have further imaging of carotid arteries and abdominal arteries for more in-depth evaluation. She is currently on pravastatin as preventive. Diabetes is doing better. Her HgbA1c is 7.0 in the office today. It was 8.0 in he endocrinologist's office in 12/2019.      Current Medication: Outpatient Encounter Medications as of 02/16/2020  Medication Sig  . B COMPLEX-C PO Take by mouth daily.  . Calcium Carb-Cholecalciferol (CALCIUM + D3) 600-200 MG-UNIT TABS Take 1 tablet by mouth 2 (two) times daily.  . Calcium Carbonate-Vitamin D 600-200 MG-UNIT TABS  Calcium 600 + D(3) 600 mg (1,500 mg)-200 unit tablet  TAKE 1 TABLET BY MOUTH 2 (TWO) TIMES DAILY.  . diphenhydrAMINE (BENADRYL) 25 MG tablet Take 25 mg by mouth every 6 (six) hours as needed.  Marland Kitchen EPINEPHrine (EPIPEN 2-PAK) 0.3 mg/0.3 mL IJ SOAJ injection Inject 0.3 mLs (0.3 mg total) into the skin as needed.  . etodolac (LODINE) 400 MG tablet TAKE 1 TABLET BY MOUTH  2 TIMES A DAY AS NEEDED FOR PAIN  . fluticasone (FLONASE) 50 MCG/ACT nasal spray Place into both nostrils daily.  Marland Kitchen glucose blood (ONETOUCH VERIO) test strip Blood sugar testing done QD and as needed  E11.65  . insulin degludec (TRESIBA FLEXTOUCH) 100 UNIT/ML FlexTouch Pen Inject into the skin daily. Take in the evening, raise dosage by 2 units every 3 days until blood sugar is 130 or below.  . insulin lispro (HUMALOG) 100 UNIT/ML KwikPen Inject 10 Units into the skin 2 (two) times daily before a meal.  . linaclotide (LINZESS) 145 MCG CAPS capsule Take 1 capsule (145 mcg total) by mouth daily before breakfast.  . nadolol (CORGARD) 80 MG tablet TAKE 1 TABLET (80 MG TOTAL) BY MOUTH DAILY. **PLEASE SCHEDULE 6 MONTH FOLLOW UP APPT**  . NOVOTWIST 32G X 5 MM MISC Inject into the skin daily.  . Omega-3 Fatty Acids (FISH OIL OMEGA-3 PO) Take by mouth daily.  . pantoprazole (PROTONIX) 40 MG tablet Take 1 tablet (40 mg total) by mouth daily.  . pravastatin (PRAVACHOL) 20 MG tablet Take 20 mg by  mouth daily.  Marland Kitchen PROAIR HFA 108 (90 Base) MCG/ACT inhaler 2 puffs 2 (two) times daily as needed.  . tamoxifen (NOLVADEX) 10 MG tablet Take 1 tablet (10 mg total) by mouth daily.  Marland Kitchen VITAMIN E PO Take by mouth daily.  . [DISCONTINUED] glipiZIDE (GLUCOTROL) 5 MG tablet Take 1 tablet (5 mg total) by mouth daily before breakfast.  . [DISCONTINUED] imipramine (TOFRANIL) 25 MG tablet TAKE 2 TABLETS (50 MG TOTAL) BY MOUTH AT BEDTIME.  . [DISCONTINUED] valsartan-hydrochlorothiazide (DIOVAN-HCT) 80-12.5 MG tablet Take 1 tablet by mouth daily.   No  facility-administered encounter medications on file as of 02/16/2020.    Surgical History: Past Surgical History:  Procedure Laterality Date  . BREAST BIOPSY Right 2005   negative  . BREAST BIOPSY Left 08/23/2007   negative  . BREAST BIOPSY Left 10/24/2013   positive  . BREAST BIOPSY Left 07/2002   neg  . BREAST BIOPSY Left 08/23/2007   neg  . BREAST SURGERY Left 10/2011   Cyst Aspirationapocrine metaplasia, ductal cells and bone cells, hypo-cellular  . BREAST SURGERY Left 2009   ADH on stereotactic biopsy, 1.5 mm focus.  Marland Kitchen BREAST SURGERY Left 2003   fibrocystic changes with ductal hyperplasia without atypia.  Marland Kitchen BREAST SURGERY Left    mastectomy  . BREAST SURGERY Left April 11, 2014   Removal of implant, debridement chest wall Dr.Coan  . CESAREAN SECTION  1991  . CHOLECYSTECTOMY  1990  . COLONOSCOPY  08/26/2013   Verdie Shire, M.D. normal.  . CRYOABLATION  2005, 2010  . CYST REMOVAL NECK  10/2011   Dr. Tami Ribas  . ESOPHAGOGASTRODUODENOSCOPY (EGD) WITH PROPOFOL N/A 01/16/2017   Procedure: ESOPHAGOGASTRODUODENOSCOPY (EGD) WITH PROPOFOL;  Surgeon: Lucilla Lame, MD;  Location: [facility];  Service: Gastroenterology;  Laterality: N/A;  Diabetic - oral meds  . INCISION AND DRAINAGE Left Dec 2015, Feb 2016   Dr Tula Nakayama  . MASTECTOMY Left 11/24/2013   positive  . PORTACATH PLACEMENT  11/24/13    Medical History: Past Medical History:  Diagnosis Date  . Arthritis 2006  . Asthma   . Breast cancer ([place]) 10/14/2013   18 mm, T1c, N0; ER/ PR positive,her 2 neu overexpressed. Adjuvant chemo/ herceptin.  . Cancer [facility]) 2006   Renal cell carcinoma; cryosurgery treatment, right side  . Cirrhosis ([place])   . Collapsed lung 2007  . Diabetes mellitus without complication ([place]) 5093   Metformin  . Diffuse cystic mastopathy   . Family history of breast cancer   . Family history of liver cancer   . Motion sickness    any moving vehicle  . Orthodontics    braces  . Personal history of  chemotherapy   . Sinus problem     Family History: Family History  Problem Relation Age of Onset  . Liver cancer Father   . Hemochromatosis Father   . Liver disease Father   . Diabetes Mother   . Hypertension Mother   . Breast cancer Paternal Aunt 90  . Ovarian cancer Maternal Grandmother        unk primary, metastatic cancer  . Hemachromatosis Daughter   . Liver cancer Paternal Uncle   . Hemochromatosis Paternal Uncle     Social History   Socioeconomic History  . Marital status: Married    Spouse name: Not on file  . Number of children: Not on file  . Years of education: Not on file  . Highest education level: Not on file  Occupational History  .  Not on file  Tobacco Use  . Smoking status: Never Smoker  . Smokeless tobacco: Never Used  Vaping Use  . Vaping Use: Never used  Substance and Sexual Activity  . Alcohol use: Not Currently    Comment: occasionally, none recently  . Drug use: No  . Sexual activity: Not on file  Other Topics Concern  . Not on file  Social History Narrative  . Not on file   Social Determinants of Health   Financial Resource Strain:   . Difficulty of Paying Living Expenses: Not on file  Food Insecurity:   . Worried About Charity fundraiser in the Last Year: Not on file  . Ran Out of Food in the Last Year: Not on file  Transportation Needs:   . Lack of Transportation (Medical): Not on file  . Lack of Transportation (Non-Medical): Not on file  Physical Activity:   . Days of Exercise per Week: Not on file  . Minutes of Exercise per Session: Not on file  Stress:   . Feeling of Stress : Not on file  Social Connections:   . Frequency of Communication with Friends and Family: Not on file  . Frequency of Social Gatherings with Friends and Family: Not on file  . Attends Religious Services: Not on file  . Active Member of Clubs or Organizations: Not on file  . Attends Archivist Meetings: Not on file  . Marital Status: Not on  file  Intimate Partner Violence:   . Fear of Current or Ex-Partner: Not on file  . Emotionally Abused: Not on file  . Physically Abused: Not on file  . Sexually Abused: Not on file      Review of Systems  Constitutional: Positive for fatigue. Negative for chills and unexpected weight change.  HENT: Positive for postnasal drip. Negative for congestion, rhinorrhea and sore throat.        Chronic allergy symptoms. New allergic symptoms related to eating. Food allergen profile done in 07/2019 shwoing only mild allergy to sesame seeds.   Respiratory: Negative for cough, chest tightness, shortness of breath and wheezing.   Cardiovascular: Negative for chest pain and palpitations.  Gastrointestinal: Negative for abdominal pain, constipation, diarrhea, nausea and vomiting.  Endocrine: Negative for cold intolerance, heat intolerance, polydipsia and polyuria.       Improved blood sugars.   Musculoskeletal: Positive for arthralgias. Negative for back pain, joint swelling and neck pain.       Bilateral knee pain. Hurting a great deal when going up and down the stairs. Hurts to flex and/or extend the legs. Also hurts when applying weight to both legs.   Skin: Negative for rash.  Allergic/Immunologic: Positive for environmental allergies and food allergies.  Neurological: Positive for headaches. Negative for dizziness, tremors and numbness.  Hematological: Negative for adenopathy. Does not bruise/bleed easily.  Psychiatric/Behavioral: Negative for behavioral problems (Depression), sleep disturbance and suicidal ideas. The patient is not nervous/anxious.     Today's Vitals   02/16/20 0920  BP: (!) 146/81  Pulse: 84  Resp: 16  Temp: (!) 97.5 F (36.4 C)  SpO2: 98%  Weight: 219 lb (99.3 kg)  Height: 5' 6"  (1.676 m)   Body mass index is 35.35 kg/m.  Physical Exam Vitals and nursing note reviewed.  Constitutional:      General: She is not in acute distress.    Appearance: Normal appearance.  She is well-developed. She is not diaphoretic.  HENT:     Head: Normocephalic  and atraumatic.     Nose: Nose normal.     Mouth/Throat:     Pharynx: No oropharyngeal exudate.  Eyes:     Conjunctiva/sclera: Conjunctivae normal.     Pupils: Pupils are equal, round, and reactive to light.  Neck:     Thyroid: No thyromegaly.     Vascular: No carotid bruit or JVD.     Trachea: No tracheal deviation.     Comments: Mild thyromegaly present.  Cardiovascular:     Rate and Rhythm: Normal rate and regular rhythm.     Pulses: Normal pulses.     Heart sounds: Normal heart sounds. No murmur heard.  No friction rub. No gallop.   Pulmonary:     Effort: Pulmonary effort is normal. No respiratory distress.     Breath sounds: Normal breath sounds. No wheezing or rales.  Chest:     Chest wall: No tenderness.  Abdominal:     General: Bowel sounds are normal.     Palpations: Abdomen is soft.     Tenderness: There is no abdominal tenderness.  Musculoskeletal:        General: Normal range of motion.     Cervical back: Normal range of motion and neck supple.     Comments: There is bilateral knee pain with mild swelling present. Flexion and extension of the knees causes increased pain. Crepitus can be felt with movement.   Lymphadenopathy:     Cervical: No cervical adenopathy.  Skin:    General: Skin is warm and dry.  Neurological:     General: No focal deficit present.     Mental Status: She is alert and oriented to person, place, and time.     Cranial Nerves: No cranial nerve deficit.  Psychiatric:        Mood and Affect: Mood normal.        Behavior: Behavior normal.        Thought Content: Thought content normal.        Judgment: Judgment normal.    Assessment/Plan: 1. Complex tear of medial meniscus of right knee as current injury, subsequent encounter Reviewed x-rays and MRI of the right knee with the patient. An x-ray of the right knee did show A 14 mm lucency within the central superior  aspect of the patella may represent a subchondral cyst, however is nonspecific, and greater than expected for the degree of degenerative change which ismild. MRI of the right knee shows Complex tearing posterior horn and posterior body of the medial meniscus. Osteoarthritis about the knee is worst in the patellofemoral compartment. Subchondral cyst formation accounts for the appearance of the patella on plain films. Ruptured Baker's cyst with a moderately large volume of fluid dissecting superficial to the medial gastrocnemius. A referral to orthopedics has been made today.  - Ambulatory referral to Orthopedic Surgery  2. Aortic atherosclerosis (HCC) Aortic atherosclerosis present on x-ray of lumbar spine. Will get ultrasound of abdominal vasculature and a carotid doppler for further evaluation.  - VAS US AORTA/IVC/ILIACS; Future - US Carotid Bilateral; Future  3. Type 2 diabetes mellitus with hyperglycemia, with long-term current use of insulin (HCC) - POCT HgB A1C 7.0 today in the office. This is improved from recent check with endocrinology which was 8.0. she should continue regular visits with endocrinology as scheduled   4. Essential hypertension Stable. Continue bp medication as prescribed   5. Multinodular goiter Will continue to monitor.   General Counseling: tyreanna bisesi understanding of the findings of todays  visit and agrees with plan of treatment. I have discussed any further diagnostic evaluation that may be needed or ordered today. We also reviewed her medications today. she has been encouraged to call the office with any questions or concerns that should arise related to todays visit.  This patient was seen by Leretha Pol FNP Collaboration with Dr Lavera Guise as a part of collaborative care agreement  Orders Placed This Encounter  Procedures  . US Carotid Bilateral  . Ambulatory referral to Orthopedic Surgery  . POCT HgB A1C  . VAS US AORTA/IVC/ILIACS       Total time spent: 45 Minutes   Time spent includes review of chart, medications, test results, and follow up plan with the patient.      Dr Lavera Guise Internal medicine

## 2020-02-19 NOTE — Progress Notes (Signed)
Reviewed with patient during visit

## 2020-02-19 NOTE — Progress Notes (Signed)
Reviewed with patient during her visit. Referred to orthopedics

## 2020-02-20 LAB — SURGICAL PATHOLOGY

## 2020-02-27 ENCOUNTER — Other Ambulatory Visit: Payer: Self-pay

## 2020-02-27 DIAGNOSIS — K580 Irritable bowel syndrome with diarrhea: Secondary | ICD-10-CM

## 2020-02-27 DIAGNOSIS — E1165 Type 2 diabetes mellitus with hyperglycemia: Secondary | ICD-10-CM

## 2020-02-27 MED ORDER — IMIPRAMINE HCL 25 MG PO TABS: 50.0000 mg | ORAL_TABLET | Freq: Every day | ORAL | 1 refills | Status: DC

## 2020-02-27 MED ORDER — GLIPIZIDE 5 MG PO TABS: 5.0000 mg | ORAL_TABLET | Freq: Every day | ORAL | 1 refills | Status: DC

## 2020-02-27 MED ORDER — VALSARTAN-HYDROCHLOROTHIAZIDE 80-12.5 MG PO TABS: 1.0000 | ORAL_TABLET | Freq: Every day | ORAL | 2 refills | Status: DC

## 2020-03-02 DIAGNOSIS — S86912A Strain of unspecified muscle(s) and tendon(s) at lower leg level, left leg, initial encounter: Secondary | ICD-10-CM | POA: Diagnosis not present

## 2020-03-02 DIAGNOSIS — M1710 Unilateral primary osteoarthritis, unspecified knee: Secondary | ICD-10-CM | POA: Insufficient documentation

## 2020-03-02 DIAGNOSIS — M179 Osteoarthritis of knee, unspecified: Secondary | ICD-10-CM | POA: Insufficient documentation

## 2020-03-02 DIAGNOSIS — S83241A Other tear of medial meniscus, current injury, right knee, initial encounter: Secondary | ICD-10-CM | POA: Diagnosis not present

## 2020-03-04 DIAGNOSIS — S83231A Complex tear of medial meniscus, current injury, right knee, initial encounter: Secondary | ICD-10-CM | POA: Insufficient documentation

## 2020-03-04 DIAGNOSIS — I700 Atherosclerosis of aorta: Secondary | ICD-10-CM | POA: Insufficient documentation

## 2020-03-09 ENCOUNTER — Other Ambulatory Visit: Payer: Self-pay

## 2020-03-12 DIAGNOSIS — S83241A Other tear of medial meniscus, current injury, right knee, initial encounter: Secondary | ICD-10-CM | POA: Diagnosis not present

## 2020-03-12 DIAGNOSIS — M170 Bilateral primary osteoarthritis of knee: Secondary | ICD-10-CM | POA: Diagnosis not present

## 2020-03-16 ENCOUNTER — Other Ambulatory Visit: Payer: BC Managed Care – PPO

## 2020-03-23 ENCOUNTER — Other Ambulatory Visit: Payer: BC Managed Care – PPO

## 2020-03-23 DIAGNOSIS — M170 Bilateral primary osteoarthritis of knee: Secondary | ICD-10-CM | POA: Diagnosis not present

## 2020-03-27 DIAGNOSIS — M170 Bilateral primary osteoarthritis of knee: Secondary | ICD-10-CM | POA: Diagnosis not present

## 2020-03-30 ENCOUNTER — Encounter: Payer: Self-pay | Admitting: Nurse Practitioner

## 2020-03-30 ENCOUNTER — Ambulatory Visit: Payer: BC Managed Care – PPO

## 2020-03-30 ENCOUNTER — Other Ambulatory Visit: Payer: Self-pay

## 2020-03-30 DIAGNOSIS — E042 Nontoxic multinodular goiter: Secondary | ICD-10-CM | POA: Diagnosis not present

## 2020-04-06 ENCOUNTER — Other Ambulatory Visit: Payer: BC Managed Care – PPO

## 2020-04-07 NOTE — Progress Notes (Signed)
Stable appearing thyroid ultrasound. Monitor closely. Follow up ultrasound should be done in 6 to 12 months. May want to discuss with endocrinology.

## 2020-04-10 DIAGNOSIS — M170 Bilateral primary osteoarthritis of knee: Secondary | ICD-10-CM | POA: Diagnosis not present

## 2020-04-11 ENCOUNTER — Other Ambulatory Visit: Payer: Self-pay

## 2020-04-11 DIAGNOSIS — M159 Polyosteoarthritis, unspecified: Secondary | ICD-10-CM

## 2020-04-11 MED ORDER — ETODOLAC 400 MG PO TABS: ORAL_TABLET | ORAL | 0 refills | Status: DC

## 2020-04-13 ENCOUNTER — Ambulatory Visit: Payer: BC Managed Care – PPO | Admitting: Nurse Practitioner

## 2020-04-17 ENCOUNTER — Encounter: Payer: Self-pay | Admitting: Internal Medicine

## 2020-04-17 ENCOUNTER — Ambulatory Visit: Payer: BC Managed Care – PPO | Admitting: Nurse Practitioner

## 2020-04-17 ENCOUNTER — Other Ambulatory Visit: Payer: Self-pay

## 2020-04-17 DIAGNOSIS — M170 Bilateral primary osteoarthritis of knee: Secondary | ICD-10-CM | POA: Diagnosis not present

## 2020-04-17 MED ORDER — LINACLOTIDE 145 MCG PO CAPS: 145.0000 ug | ORAL_CAPSULE | Freq: Every day | ORAL | 3 refills | Status: DC

## 2020-04-20 ENCOUNTER — Telehealth: Payer: Self-pay | Admitting: Internal Medicine

## 2020-04-20 NOTE — Telephone Encounter (Signed)
On 12/09-spoke to patient regarding her concerns of thyroid nodules.  Low clinical suspicion for metastatic disease to the thyroid.  We will review imaging-and will reach out to her again.  Patient agreement.

## 2020-04-23 ENCOUNTER — Telehealth: Payer: Self-pay | Admitting: Internal Medicine

## 2020-04-23 NOTE — Telephone Encounter (Signed)
On 12/10-called patient to discuss her concerns of subcentimeter thyroid nodules [not been changing between 2 to 4 mm] noted on ultrasound.  Unlikely site of solitary metastatic disease from her stating kidney cancer especially if her MRI abdomen-August 2021 did not show metastatic disease.  For now recommend continued surveillance.  Patient agreement.

## 2020-04-25 ENCOUNTER — Other Ambulatory Visit: Payer: BC Managed Care – PPO

## 2020-04-25 DIAGNOSIS — Z20822 Contact with and (suspected) exposure to covid-19: Secondary | ICD-10-CM

## 2020-04-26 LAB — NOVEL CORONAVIRUS, NAA: SARS-CoV-2, NAA: NOT DETECTED

## 2020-04-26 LAB — SARS-COV-2, NAA 2 DAY TAT

## 2020-05-09 ENCOUNTER — Other Ambulatory Visit: Payer: Self-pay | Admitting: Internal Medicine

## 2020-05-09 NOTE — Telephone Encounter (Signed)
Lauren - Please consider RF

## 2020-05-17 ENCOUNTER — Other Ambulatory Visit: Payer: Self-pay

## 2020-05-17 DIAGNOSIS — M159 Polyosteoarthritis, unspecified: Secondary | ICD-10-CM

## 2020-05-17 MED ORDER — ETODOLAC 400 MG PO TABS: ORAL_TABLET | ORAL | 0 refills | Status: DC

## 2020-05-24 ENCOUNTER — Other Ambulatory Visit: Payer: Self-pay | Admitting: Gastroenterology

## 2020-05-30 ENCOUNTER — Inpatient Hospital Stay: Payer: BC Managed Care – PPO | Admitting: Internal Medicine

## 2020-05-30 ENCOUNTER — Ambulatory Visit: Payer: BC Managed Care – PPO | Admitting: Hospice and Palliative Medicine

## 2020-06-11 ENCOUNTER — Other Ambulatory Visit: Payer: Self-pay

## 2020-06-11 DIAGNOSIS — M159 Polyosteoarthritis, unspecified: Secondary | ICD-10-CM

## 2020-06-11 MED ORDER — ETODOLAC 400 MG PO TABS: ORAL_TABLET | ORAL | 0 refills | Status: DC

## 2020-06-13 ENCOUNTER — Ambulatory Visit: Payer: BC Managed Care – PPO | Admitting: Hospice and Palliative Medicine

## 2020-06-19 ENCOUNTER — Encounter: Payer: Self-pay | Admitting: Hospice and Palliative Medicine

## 2020-06-19 ENCOUNTER — Ambulatory Visit (INDEPENDENT_AMBULATORY_CARE_PROVIDER_SITE_OTHER): Payer: BC Managed Care – PPO | Admitting: Hospice and Palliative Medicine

## 2020-06-19 VITALS — BP 129/72 | HR 65 | Temp 97.0°F | Ht 66.0 in | Wt 222.5 lb

## 2020-06-19 DIAGNOSIS — R059 Cough, unspecified: Secondary | ICD-10-CM | POA: Diagnosis not present

## 2020-06-19 DIAGNOSIS — U071 COVID-19: Secondary | ICD-10-CM | POA: Diagnosis not present

## 2020-06-19 MED ORDER — AZITHROMYCIN 250 MG PO TABS: ORAL_TABLET | ORAL | 0 refills | Status: DC

## 2020-06-19 MED ORDER — BENZONATATE 100 MG PO CAPS: 100.0000 mg | ORAL_CAPSULE | Freq: Two times a day (BID) | ORAL | 0 refills | Status: DC | PRN

## 2020-06-19 MED ORDER — PREDNISONE 10 MG PO TABS: ORAL_TABLET | ORAL | 0 refills | Status: DC

## 2020-06-19 NOTE — Progress Notes (Signed)
[facility] [place], [place] [address]  Internal MEDICINE  Telephone Visit  Patient Name: Danielle Wallace  [id number]  [id number]  Date of Service: 06/23/2020  I connected with the patient at 1622 by telephone and verified the patients identity using two identifiers.   I discussed the limitations, risks, security and privacy concerns of performing an evaluation and management service by telephone and the availability of in person appointments. I also discussed with the patient that there may be a patient responsible charge related to the service.  The patient expressed understanding and agrees to proceed.    Chief Complaint  Patient presents with  . Telephone Assessment    [phone]  . Telephone Screen  . Sore Throat  . Sinusitis  . Headache    Ringing in both ears  . Dizziness  . Cough    HPI Patient is being seen virtually today for acute sick visit Tested positive today for COVID on home testing C/o headaches, ringing and fullness in her ears, coughing and congestion Taking appropriate quarantine measures Has not been taking OTC medications for symptoms Denies shortness of breath or trouble breathing   Current Medication: Outpatient Encounter Medications as of 06/19/2020  Medication Sig  . azithromycin (ZITHROMAX Z-PAK) 250 MG tablet Take two 250 mg tablets on day one followed by one 250 mg tablet each day for four days.  . B COMPLEX-C PO Take by mouth daily.  . benzonatate (TESSALON) 100 MG capsule Take 1 capsule (100 mg total) by mouth 2 (two) times daily as needed for cough.  . Calcium Carb-Cholecalciferol (CALCIUM + D3) 600-200 MG-UNIT TABS Take 1 tablet by mouth 2 (two) times daily.  . Calcium Carbonate-Vitamin D 600-200 MG-UNIT TABS Calcium 600 + D(3) 600 mg (1,500 mg)-200 unit tablet  TAKE 1 TABLET BY MOUTH 2 (TWO) TIMES DAILY.  . diphenhydrAMINE (BENADRYL) 25 MG tablet Take 25 mg by mouth every 6 (six) hours as needed.  Marland Kitchen EPINEPHrine  (EPIPEN 2-PAK) 0.3 mg/0.3 mL IJ SOAJ injection Inject 0.3 mLs (0.3 mg total) into the skin as needed.  . etodolac (LODINE) 400 MG tablet TAKE 1 TABLET BY MOUTH  2 TIMES A DAY AS NEEDED FOR PAIN  . fluticasone (FLONASE) 50 MCG/ACT nasal spray Place into both nostrils daily.  Marland Kitchen glipiZIDE (GLUCOTROL) 5 MG tablet Take 1 tablet (5 mg total) by mouth daily before breakfast.  . glucose blood (ONETOUCH VERIO) test strip Blood sugar testing done QD and as needed  E11.65  . imipramine (TOFRANIL) 25 MG tablet Take 2 tablets (50 mg total) by mouth at bedtime.  . insulin degludec (TRESIBA FLEXTOUCH) 100 UNIT/ML FlexTouch Pen Inject into the skin daily. Take in the evening, raise dosage by 2 units every 3 days until blood sugar is 130 or below.  . insulin lispro (HUMALOG) 100 UNIT/ML KwikPen Inject 10 Units into the skin 2 (two) times daily before a meal.  . linaclotide (LINZESS) 145 MCG CAPS capsule Take 1 capsule (145 mcg total) by mouth daily before breakfast.  . nadolol (CORGARD) 80 MG tablet TAKE 1 TABLET (80 MG TOTAL) BY MOUTH DAILY. **PLEASE SCHEDULE 6 MONTH FOLLOW UP APPT**  . NOVOTWIST 32G X 5 MM MISC Inject into the skin daily.  . Omega-3 Fatty Acids (FISH OIL OMEGA-3 PO) Take by mouth daily.  . pantoprazole (PROTONIX) 40 MG tablet Take 1 tablet (40 mg total) by mouth daily.  . pravastatin (PRAVACHOL) 20 MG tablet Take 20 mg by mouth daily.  Marland Kitchen  predniSONE (DELTASONE) 10 MG tablet Take 1 tablet three times a day with a meal for three for three days, take 1 tablet by twice daily with a meal for 3 days, take 1 tablet once daily with a meal for 3 days  . PROAIR HFA 108 (90 Base) MCG/ACT inhaler 2 puffs 2 (two) times daily as needed.  . tamoxifen (NOLVADEX) 10 MG tablet TAKE 1 TABLET BY MOUTH EVERY DAY  . valsartan-hydrochlorothiazide (DIOVAN-HCT) 80-12.5 MG tablet Take 1 tablet by mouth daily.  Marland Kitchen VITAMIN E PO Take by mouth daily.   No facility-administered encounter medications on file as of 06/19/2020.     Surgical History: Past Surgical History:  Procedure Laterality Date  . BREAST BIOPSY Right 2005   negative  . BREAST BIOPSY Left 08/23/2007   negative  . BREAST BIOPSY Left 10/24/2013   positive  . BREAST BIOPSY Left 07/2002   neg  . BREAST BIOPSY Left 08/23/2007   neg  . BREAST SURGERY Left 10/2011   Cyst Aspirationapocrine metaplasia, ductal cells and bone cells, hypo-cellular  . BREAST SURGERY Left 2009   ADH on stereotactic biopsy, 1.5 mm focus.  Marland Kitchen BREAST SURGERY Left 2003   fibrocystic changes with ductal hyperplasia without atypia.  Marland Kitchen BREAST SURGERY Left    mastectomy  . BREAST SURGERY Left April 11, 2014   Removal of implant, debridement chest wall Dr.Coan  . CESAREAN SECTION  1991  . CHOLECYSTECTOMY  1990  . COLONOSCOPY  08/26/2013   Verdie Shire, M.D. normal.  . CRYOABLATION  2005, 2010  . CYST REMOVAL NECK  10/2011   Dr. Tami Ribas  . ESOPHAGOGASTRODUODENOSCOPY (EGD) WITH PROPOFOL N/A 01/16/2017   Procedure: ESOPHAGOGASTRODUODENOSCOPY (EGD) WITH PROPOFOL;  Surgeon: Lucilla Lame, MD;  Location: [facility];  Service: Gastroenterology;  Laterality: N/A;  Diabetic - oral meds  . INCISION AND DRAINAGE Left Dec 2015, Feb 2016   Dr Tula Nakayama  . MASTECTOMY Left 11/24/2013   positive  . PORTACATH PLACEMENT  11/24/13    Medical History: Past Medical History:  Diagnosis Date  . Arthritis 2006  . Asthma   . Breast cancer ([place]) 10/14/2013   18 mm, T1c, N0; ER/ PR positive,her 2 neu overexpressed. Adjuvant chemo/ herceptin.  . Cancer [facility]) 2006   Renal cell carcinoma; cryosurgery treatment, right side  . Cirrhosis ([place])   . Collapsed lung 2007  . Diabetes mellitus without complication ([place]) 9030   Metformin  . Diffuse cystic mastopathy   . Family history of breast cancer   . Family history of liver cancer   . Motion sickness    any moving vehicle  . Orthodontics    braces  . Personal history of chemotherapy   . Sinus problem     Family History: Family History   Problem Relation Age of Onset  . Liver cancer Father   . Hemochromatosis Father   . Liver disease Father   . Diabetes Mother   . Hypertension Mother   . Breast cancer Paternal Aunt 17  . Ovarian cancer Maternal Grandmother        unk primary, metastatic cancer  . Hemachromatosis Daughter   . Liver cancer Paternal Uncle   . Hemochromatosis Paternal Uncle     Social History   Socioeconomic History  . Marital status: Married    Spouse name: Not on file  . Number of children: Not on file  . Years of education: Not on file  . Highest education level: Not on file  Occupational History  .  Not on file  Tobacco Use  . Smoking status: Never Smoker  . Smokeless tobacco: Never Used  Vaping Use  . Vaping Use: Never used  Substance and Sexual Activity  . Alcohol use: Not Currently    Comment: occasionally, none recently  . Drug use: No  . Sexual activity: Not on file  Other Topics Concern  . Not on file  Social History Narrative  . Not on file   Social Determinants of Health   Financial Resource Strain: Not on file  Food Insecurity: Not on file  Transportation Needs: Not on file  Physical Activity: Not on file  Stress: Not on file  Social Connections: Not on file  Intimate Partner Violence: Not on file      Review of Systems  Constitutional: Positive for fatigue. Negative for chills and diaphoresis.  HENT: Positive for congestion, sinus pressure, sinus pain and tinnitus. Negative for ear pain and postnasal drip.   Eyes: Negative for photophobia, discharge, redness, itching and visual disturbance.  Respiratory: Positive for cough. Negative for shortness of breath and wheezing.   Cardiovascular: Negative for chest pain, palpitations and leg swelling.  Gastrointestinal: Negative for abdominal pain, constipation, diarrhea, nausea and vomiting.  Genitourinary: Negative for dysuria and flank pain.  Musculoskeletal: Negative for arthralgias, back pain, gait problem and neck  pain.  Skin: Negative for color change.  Allergic/Immunologic: Negative for environmental allergies and food allergies.  Neurological: Negative for dizziness and headaches.  Hematological: Does not bruise/bleed easily.  Psychiatric/Behavioral: Negative for agitation, behavioral problems (depression) and hallucinations.    Vital Signs: BP 129/72   Pulse 65   Temp (!) 97 F (36.1 C)   Ht 5' 6"  (1.676 m)   Wt 222 lb 8 oz (100.9 kg)   BMI 35.91 kg/m    Observation/Objective: Alert and oriented, answers questions appropriately. No acute/respiratory distress noted  Assessment/Plan: 1. COVID-19 virus detected Start treatment with ZPAK as well as low dose prednisone taper Advised to contact office if symptoms worsened or have not improved within 10 days - predniSONE (DELTASONE) 10 MG tablet; Take 1 tablet three times a day with a meal for three for three days, take 1 tablet by twice daily with a meal for 3 days, take 1 tablet once daily with a meal for 3 days  Dispense: 18 tablet; Refill: 0 - azithromycin (ZITHROMAX Z-PAK) 250 MG tablet; Take two 250 mg tablets on day one followed by one 250 mg tablet each day for four days.  Dispense: 6 each; Refill: 0  2. Cough Tessalon as needed for cough Consider CXR if symptoms worsen or have not improved - benzonatate (TESSALON) 100 MG capsule; Take 1 capsule (100 mg total) by mouth 2 (two) times daily as needed for cough.  Dispense: 20 capsule; Refill: 0  General Counseling: Shakila verbalizes understanding of the findings of today's phone visit and agrees with plan of treatment. I have discussed any further diagnostic evaluation that may be needed or ordered today. We also reviewed her medications today. she has been encouraged to call the office with any questions or concerns that should arise related to todays visit.    No orders of the defined types were placed in this encounter.   Meds ordered this encounter  Medications  . predniSONE  (DELTASONE) 10 MG tablet    Sig: Take 1 tablet three times a day with a meal for three for three days, take 1 tablet by twice daily with a meal for 3 days, take 1  tablet once daily with a meal for 3 days    Dispense:  18 tablet    Refill:  0  . benzonatate (TESSALON) 100 MG capsule    Sig: Take 1 capsule (100 mg total) by mouth 2 (two) times daily as needed for cough.    Dispense:  20 capsule    Refill:  0  . azithromycin (ZITHROMAX Z-PAK) 250 MG tablet    Sig: Take two 250 mg tablets on day one followed by one 250 mg tablet each day for four days.    Dispense:  6 each    Refill:  0     Time spent: 25 Minutes Time spent includes review of chart, medications, test results and follow-up plan with the patient.  Tanna Furry Maraki Macquarrie AGNP-C Internal medicine

## 2020-06-23 ENCOUNTER — Encounter: Payer: Self-pay | Admitting: Hospice and Palliative Medicine

## 2020-06-27 ENCOUNTER — Other Ambulatory Visit: Payer: Self-pay

## 2020-06-27 ENCOUNTER — Telehealth: Payer: Self-pay

## 2020-06-27 ENCOUNTER — Other Ambulatory Visit: Payer: Self-pay | Admitting: Hospice and Palliative Medicine

## 2020-06-27 DIAGNOSIS — R059 Cough, unspecified: Secondary | ICD-10-CM

## 2020-06-27 MED ORDER — PROAIR HFA 108 (90 BASE) MCG/ACT IN AERS: 2.0000 | INHALATION_SPRAY | Freq: Four times a day (QID) | RESPIRATORY_TRACT | 2 refills | Status: DC | PRN

## 2020-06-27 NOTE — Telephone Encounter (Signed)
Pt called that she still coughing and shortness of breath we send pro air and also advised her to take mucinex for cough and also call us back if she not feeling better we can send her for chest xray

## 2020-07-02 ENCOUNTER — Telehealth: Payer: Self-pay

## 2020-07-02 ENCOUNTER — Other Ambulatory Visit: Payer: Self-pay

## 2020-07-02 ENCOUNTER — Ambulatory Visit
Admission: RE | Admit: 2020-07-02 | Discharge: 2020-07-02 | Disposition: A | Discharge status: Home / Self Care | Payer: BC Managed Care – PPO | Source: Ambulatory Visit | Attending: Hospice and Palliative Medicine | Admitting: Hospice and Palliative Medicine

## 2020-07-02 DIAGNOSIS — U071 COVID-19: Secondary | ICD-10-CM

## 2020-07-02 DIAGNOSIS — R059 Cough, unspecified: Secondary | ICD-10-CM

## 2020-07-02 MED ORDER — DOXYCYCLINE HYCLATE 100 MG PO CAPS: 100.0000 mg | ORAL_CAPSULE | Freq: Two times a day (BID) | ORAL | 0 refills | Status: DC

## 2020-07-02 NOTE — Progress Notes (Signed)
Please let her know that CXR is normal--what symptoms is she still having?

## 2020-07-02 NOTE — Telephone Encounter (Signed)
Pt advised chest xray normal pt still having sinus drainage and coughing as per dr Humphrey Rolls send doxycycline and advised to take OTC Flonase and Claritin and drink plenty of water and rest

## 2020-07-02 NOTE — Progress Notes (Signed)
Added stat order for chest x ray, due to covid a few weeks ago and pt not feeling better. Danielle Wallace

## 2020-07-02 NOTE — Telephone Encounter (Signed)
-----   Message from Luiz Ochoa, NP sent at 07/02/2020 12:51 PM EST ----- Please let her know that CXR is normal--what symptoms is she still having?

## 2020-07-04 ENCOUNTER — Other Ambulatory Visit: Payer: Self-pay

## 2020-07-04 ENCOUNTER — Ambulatory Visit: Payer: BC Managed Care – PPO | Admitting: Hospice and Palliative Medicine

## 2020-07-04 ENCOUNTER — Inpatient Hospital Stay: Payer: BC Managed Care – PPO | Admitting: Internal Medicine

## 2020-07-04 ENCOUNTER — Encounter: Payer: Self-pay | Admitting: Hospice and Palliative Medicine

## 2020-07-04 ENCOUNTER — Telehealth: Payer: Self-pay | Admitting: *Deleted

## 2020-07-04 VITALS — BP 124/62 | HR 75 | Temp 97.3°F | Resp 16 | Ht 66.0 in | Wt 221.2 lb

## 2020-07-04 DIAGNOSIS — K7460 Unspecified cirrhosis of liver: Secondary | ICD-10-CM

## 2020-07-04 DIAGNOSIS — R053 Chronic cough: Secondary | ICD-10-CM

## 2020-07-04 DIAGNOSIS — R059 Cough, unspecified: Secondary | ICD-10-CM

## 2020-07-04 DIAGNOSIS — Z85528 Personal history of other malignant neoplasm of kidney: Secondary | ICD-10-CM | POA: Diagnosis not present

## 2020-07-04 DIAGNOSIS — U099 Post covid-19 condition, unspecified: Secondary | ICD-10-CM

## 2020-07-04 DIAGNOSIS — K769 Liver disease, unspecified: Secondary | ICD-10-CM

## 2020-07-04 DIAGNOSIS — E1165 Type 2 diabetes mellitus with hyperglycemia: Secondary | ICD-10-CM | POA: Diagnosis not present

## 2020-07-04 DIAGNOSIS — Z794 Long term (current) use of insulin: Secondary | ICD-10-CM

## 2020-07-04 DIAGNOSIS — Z853 Personal history of malignant neoplasm of breast: Secondary | ICD-10-CM

## 2020-07-04 DIAGNOSIS — I10 Essential (primary) hypertension: Secondary | ICD-10-CM | POA: Diagnosis not present

## 2020-07-04 LAB — POCT GLYCOSYLATED HEMOGLOBIN (HGB A1C): Hemoglobin A1C: 8.6 % — AB (ref 4.0–5.6)

## 2020-07-04 MED ORDER — BENZONATATE 100 MG PO CAPS
100.0000 mg | ORAL_CAPSULE | Freq: Two times a day (BID) | ORAL | 0 refills | Status: DC | PRN
Start: 2020-07-04 — End: 2020-10-17

## 2020-07-04 NOTE — Telephone Encounter (Signed)
Reviewed chart prior to apt. Patient was dx with covid last week. Patient elected to r/s her apt virtually next week as patient still is symptomatic. mychart apt made for 3/4

## 2020-07-04 NOTE — Progress Notes (Signed)
[facility] [place], [place] [address]  Internal MEDICINE  Office Visit Note  Patient Name: Danielle Wallace  [id number]  [id number]  Date of Service: 07/06/2020  Chief Complaint  Patient presents with  . Follow-up    Cough started last Thursday, fatigue, light headed, sore throat, ringing in ears  . Diabetes  . Asthma    HPI  Patient is here for routine follow-up  Tested positive for COVID earlier this month--treated with ZPAK as well as Prednisone taper--tessalon for cough, has completed treatment but is complaining of ongoing cough, fatigue, sore throat and tinnitus CXR 2/21 negative for acute findings Tessalon capsules to help relive cough and each day she feels she may be starting to somewhat improve History of asthma--denies shortness of breath or wheezing  DM-glucose readings have been elevated with recent use of prednisone, starting to slowly return to her baseline, while taking prednisone was having frequent readings above 200 Currently taking 50 units of Tresiba daily Prescribed to take 10 units of Humalog with breakfast and dinner, routinely skips breakfast dosing as glucose levels are well controlled at that time--unaware that she could also take with lunch based on glucose readings at that time Glipizide daily Followed and managed by endocrinology  History of chronic cirrhosis and gastric varices--followed by GI Improving renal lesion on latest MRI History of left breast cancer--currently on Tamoxifen, followed by oncology  Current Medication: Outpatient Encounter Medications as of 07/04/2020  Medication Sig  . albuterol (VENTOLIN HFA) 108 (90 Base) MCG/ACT inhaler Inhale 2 puffs into the lungs every 6 (six) hours as needed for wheezing or shortness of breath.  . B COMPLEX-C PO Take by mouth daily.  . benzonatate (TESSALON) 100 MG capsule Take 1 capsule (100 mg total) by mouth 2 (two) times daily as needed for cough.  . Calcium  Carb-Cholecalciferol (CALCIUM + D3) 600-200 MG-UNIT TABS Take 1 tablet by mouth 2 (two) times daily.  . Calcium Carbonate-Vitamin D 600-200 MG-UNIT TABS Calcium 600 + D(3) 600 mg (1,500 mg)-200 unit tablet  TAKE 1 TABLET BY MOUTH 2 (TWO) TIMES DAILY.  . diphenhydrAMINE (BENADRYL) 25 MG tablet Take 25 mg by mouth every 6 (six) hours as needed.  . doxycycline (VIBRAMYCIN) 100 MG capsule Take 1 capsule (100 mg total) by mouth 2 (two) times daily.  Marland Kitchen EPINEPHrine (EPIPEN 2-PAK) 0.3 mg/0.3 mL IJ SOAJ injection Inject 0.3 mLs (0.3 mg total) into the skin as needed.  . etodolac (LODINE) 400 MG tablet TAKE 1 TABLET BY MOUTH  2 TIMES A DAY AS NEEDED FOR PAIN  . fluticasone (FLONASE) 50 MCG/ACT nasal spray Place into both nostrils daily.  Marland Kitchen glucose blood (ONETOUCH VERIO) test strip Blood sugar testing done QD and as needed  E11.65  . imipramine (TOFRANIL) 25 MG tablet Take 2 tablets (50 mg total) by mouth at bedtime.  . insulin degludec (TRESIBA FLEXTOUCH) 100 UNIT/ML FlexTouch Pen Inject into the skin daily. Take in the evening, raise dosage by 2 units every 3 days until blood sugar is 130 or below.  . insulin lispro (HUMALOG) 100 UNIT/ML KwikPen Inject 10 Units into the skin 2 (two) times daily before a meal.  . linaclotide (LINZESS) 145 MCG CAPS capsule Take 1 capsule (145 mcg total) by mouth daily before breakfast.  . nadolol (CORGARD) 80 MG tablet TAKE 1 TABLET (80 MG TOTAL) BY MOUTH DAILY. **PLEASE SCHEDULE 6 MONTH FOLLOW UP APPT**  . NOVOTWIST 32G X 5 MM MISC Inject into the skin daily.  Marland Kitchen  Omega-3 Fatty Acids (FISH OIL OMEGA-3 PO) Take by mouth daily.  . pravastatin (PRAVACHOL) 20 MG tablet Take 20 mg by mouth daily.  . predniSONE (DELTASONE) 10 MG tablet Take 1 tablet three times a day with a meal for three for three days, take 1 tablet by twice daily with a meal for 3 days, take 1 tablet once daily with a meal for 3 days  . tamoxifen (NOLVADEX) 10 MG tablet TAKE 1 TABLET BY MOUTH EVERY DAY  .  valsartan-hydrochlorothiazide (DIOVAN-HCT) 80-12.5 MG tablet Take 1 tablet by mouth daily.  Marland Kitchen VITAMIN E PO Take by mouth daily.  . [DISCONTINUED] benzonatate (TESSALON) 100 MG capsule Take 1 capsule (100 mg total) by mouth 2 (two) times daily as needed for cough.  . [DISCONTINUED] glipiZIDE (GLUCOTROL) 5 MG tablet Take 1 tablet (5 mg total) by mouth daily before breakfast.  . [DISCONTINUED] pantoprazole (PROTONIX) 40 MG tablet Take 1 tablet (40 mg total) by mouth daily.   No facility-administered encounter medications on file as of 07/04/2020.    Surgical History: Past Surgical History:  Procedure Laterality Date  . BREAST BIOPSY Right 2005   negative  . BREAST BIOPSY Left 08/23/2007   negative  . BREAST BIOPSY Left 10/24/2013   positive  . BREAST BIOPSY Left 07/2002   neg  . BREAST BIOPSY Left 08/23/2007   neg  . BREAST SURGERY Left 10/2011   Cyst Aspirationapocrine metaplasia, ductal cells and bone cells, hypo-cellular  . BREAST SURGERY Left 2009   ADH on stereotactic biopsy, 1.5 mm focus.  Marland Kitchen BREAST SURGERY Left 2003   fibrocystic changes with ductal hyperplasia without atypia.  Marland Kitchen BREAST SURGERY Left    mastectomy  . BREAST SURGERY Left April 11, 2014   Removal of implant, debridement chest wall Dr.Coan  . CESAREAN SECTION  1991  . CHOLECYSTECTOMY  1990  . COLONOSCOPY  08/26/2013   Verdie Shire, M.D. normal.  . CRYOABLATION  2005, 2010  . CYST REMOVAL NECK  10/2011   Dr. Tami Ribas  . ESOPHAGOGASTRODUODENOSCOPY (EGD) WITH PROPOFOL N/A 01/16/2017   Procedure: ESOPHAGOGASTRODUODENOSCOPY (EGD) WITH PROPOFOL;  Surgeon: Lucilla Lame, MD;  Location: [facility];  Service: Gastroenterology;  Laterality: N/A;  Diabetic - oral meds  . INCISION AND DRAINAGE Left Dec 2015, Feb 2016   Dr Tula Nakayama  . MASTECTOMY Left 11/24/2013   positive  . PORTACATH PLACEMENT  11/24/13    Medical History: Past Medical History:  Diagnosis Date  . Arthritis 2006  . Asthma   . Breast cancer ([place])  10/14/2013   18 mm, T1c, N0; ER/ PR positive,her 2 neu overexpressed. Adjuvant chemo/ herceptin.  . Cancer [facility]) 2006   Renal cell carcinoma; cryosurgery treatment, right side  . Cirrhosis ([place])   . Collapsed lung 2007  . Diabetes mellitus without complication ([place]) 0454   Metformin  . Diffuse cystic mastopathy   . Family history of breast cancer   . Family history of liver cancer   . Motion sickness    any moving vehicle  . Orthodontics    braces  . Personal history of chemotherapy   . Sinus problem     Family History: Family History  Problem Relation Age of Onset  . Liver cancer Father   . Hemochromatosis Father   . Liver disease Father   . Diabetes Mother   . Hypertension Mother   . Breast cancer Paternal Aunt 42  . Ovarian cancer Maternal Grandmother        unk primary, metastatic  cancer  . Hemachromatosis Daughter   . Liver cancer Paternal Uncle   . Hemochromatosis Paternal Uncle     Social History   Socioeconomic History  . Marital status: Married    Spouse name: Not on file  . Number of children: Not on file  . Years of education: Not on file  . Highest education level: Not on file  Occupational History  . Not on file  Tobacco Use  . Smoking status: Never Smoker  . Smokeless tobacco: Never Used  Vaping Use  . Vaping Use: Never used  Substance and Sexual Activity  . Alcohol use: Not Currently    Comment: occasionally, none recently  . Drug use: No  . Sexual activity: Not on file  Other Topics Concern  . Not on file  Social History Narrative  . Not on file   Social Determinants of Health   Financial Resource Strain: Not on file  Food Insecurity: Not on file  Transportation Needs: Not on file  Physical Activity: Not on file  Stress: Not on file  Social Connections: Not on file  Intimate Partner Violence: Not on file      Review of Systems  Constitutional: Positive for fatigue. Negative for chills and diaphoresis.  HENT: Positive for ear  pain. Negative for postnasal drip and sinus pressure.   Eyes: Negative for photophobia, discharge, redness, itching and visual disturbance.  Respiratory: Positive for cough. Negative for shortness of breath and wheezing.   Cardiovascular: Negative for chest pain, palpitations and leg swelling.  Gastrointestinal: Negative for abdominal pain, constipation, diarrhea, nausea and vomiting.  Genitourinary: Negative for dysuria and flank pain.  Musculoskeletal: Negative for arthralgias, back pain, gait problem and neck pain.  Skin: Negative for color change.  Allergic/Immunologic: Negative for environmental allergies and food allergies.  Neurological: Negative for dizziness and headaches.  Hematological: Does not bruise/bleed easily.  Psychiatric/Behavioral: Negative for agitation, behavioral problems (depression) and hallucinations.    Vital Signs: BP 124/62   Pulse 75   Temp (!) 97.3 F (36.3 C)   Resp 16   Ht 5' 6"  (1.676 m)   Wt 221 lb 3.2 oz (100.3 kg)   SpO2 99%   BMI 35.70 kg/m    Physical Exam Vitals reviewed.  Constitutional:      Appearance: Normal appearance. She is obese.  Cardiovascular:     Rate and Rhythm: Normal rate and regular rhythm.     Pulses: Normal pulses.     Heart sounds: Normal heart sounds.  Pulmonary:     Effort: Pulmonary effort is normal.     Breath sounds: Normal breath sounds.  Abdominal:     General: Abdomen is flat.     Palpations: Abdomen is soft.  Musculoskeletal:        General: Normal range of motion.     Cervical back: Normal range of motion.  Skin:    General: Skin is warm.  Neurological:     General: No focal deficit present.     Mental Status: She is alert and oriented to person, place, and time. Mental status is at baseline.  Psychiatric:        Mood and Affect: Mood normal.        Behavior: Behavior normal.        Thought Content: Thought content normal.        Judgment: Judgment normal.    Assessment/Plan: 1. Type 2  diabetes mellitus with hyperglycemia, with long-term current use of insulin (HCC) A1C 8.6--likely elevated  to recent prednisone use May take dose of Novolog with lunch based on glucose readings Followed by endocrinology - POCT HgB A1C  2. Post-COVID chronic cough CXR normal, refill Tessalon, consider chest CT if symptoms do not improve Continue with Mucinex as well as Flonase - benzonatate (TESSALON) 100 MG capsule; Take 1 capsule (100 mg total) by mouth 2 (two) times daily as needed for cough.  Dispense: 20 capsule; Refill: 0  3. Essential hypertension BP and HR well controlled on current therapy, continue to monitor  4. Personal history of renal cancer Followed by oncology  5. Personal history of breast cancer Followed by oncology  6. Chronic liver disease and cirrhosis ([place]) Followed by GI  General Counseling: Loleta Chance understanding of the findings of todays visit and agrees with plan of treatment. I have discussed any further diagnostic evaluation that may be needed or ordered today. We also reviewed her medications today. she has been encouraged to call the office with any questions or concerns that should arise related to todays visit.    Orders Placed This Encounter  Procedures  . POCT HgB A1C    Meds ordered this encounter  Medications  . benzonatate (TESSALON) 100 MG capsule    Sig: Take 1 capsule (100 mg total) by mouth 2 (two) times daily as needed for cough.    Dispense:  20 capsule    Refill:  0    Time spent: 30 Minutes Time spent includes review of chart, medications, test results and follow-up plan with the patient.  This patient was seen by Theodoro Grist AGNP-C in Collaboration with Dr Lavera Guise as a part of collaborative care agreement     Tanna Furry. Fitzroy Mikami AGNP-C Internal medicine

## 2020-07-05 ENCOUNTER — Other Ambulatory Visit: Payer: Self-pay | Admitting: Nurse Practitioner

## 2020-07-05 DIAGNOSIS — K2100 Gastro-esophageal reflux disease with esophagitis, without bleeding: Secondary | ICD-10-CM

## 2020-07-06 ENCOUNTER — Telehealth: Payer: Self-pay

## 2020-07-06 ENCOUNTER — Other Ambulatory Visit: Payer: Self-pay

## 2020-07-06 ENCOUNTER — Encounter: Payer: Self-pay | Admitting: Hospice and Palliative Medicine

## 2020-07-06 MED ORDER — IPRATROPIUM-ALBUTEROL 0.5-2.5 (3) MG/3ML IN SOLN: 3.0000 mL | Freq: Four times a day (QID) | RESPIRATORY_TRACT | 0 refills | Status: DC | PRN

## 2020-07-06 NOTE — Telephone Encounter (Signed)
Pt called still coughing as per taylor send her nebulizer med to use every 6 hrs as needed and still used Claritin and flonase

## 2020-07-08 ENCOUNTER — Other Ambulatory Visit: Payer: Self-pay | Admitting: Internal Medicine

## 2020-07-08 DIAGNOSIS — M159 Polyosteoarthritis, unspecified: Secondary | ICD-10-CM

## 2020-07-11 ENCOUNTER — Telehealth: Payer: Self-pay

## 2020-07-11 NOTE — Telephone Encounter (Signed)
Pt called that she still not feeling better with post covid  Coughing ,headache advised her take deslym for cough and she also can continue as discuss before and we made her pulmonology appt with dr Humphrey Rolls on monday

## 2020-07-13 ENCOUNTER — Inpatient Hospital Stay: Payer: BC Managed Care – PPO | Attending: Internal Medicine | Admitting: Internal Medicine

## 2020-07-13 DIAGNOSIS — Z170 Estrogen receptor positive status [ER+]: Secondary | ICD-10-CM | POA: Diagnosis not present

## 2020-07-13 DIAGNOSIS — C50412 Malignant neoplasm of upper-outer quadrant of left female breast: Secondary | ICD-10-CM | POA: Diagnosis not present

## 2020-07-13 NOTE — Assessment & Plan Note (Addendum)
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 On Taomoxifen 10 mg/day.  Tolerating well. Clinically no evidence of recurrence.  Stable.  #Arthritis/musculoskeletal symptoms- G-1; improved- continue Tamoxifen 28m/day.  # hot flashes/ vasomotor symptoms-from Tam-STABLE.   # BMD- [01/2019-]  T score=-1- continue calcium and vitamin D; stable.  # Right RCC-s/p cryoablation; MRI aug 2022- STABLE.  #Thyroid nodules-incidental [awaiting repeat ultrasound in 12 months/September 2022].  Clinically this does not make me suspicious of metastatic renal cancer going to the thyroid.  Agree with monitoring for now.  # DISPOSITION: mychart   # follow up in 6 months- MD; cbc/cmp--Dr.B

## 2020-07-13 NOTE — Progress Notes (Signed)
I connected with Danielle Wallace on 07/13/2020 at  2:30 PM EST by video enabled telemedicine visit and verified that I am speaking with the correct person using two identifiers.  I discussed the limitations, risks, security and privacy concerns of performing an evaluation and management service by telemedicine and the availability of in-person appointments. I also discussed with the patient that there may be a patient responsible charge related to this service. The patient expressed understanding and agreed to proceed.    Other persons participating in the visit and their role in the encounter: RN/medical reconciliation Patient's location: home Provider's location: office  Oncology History Overview Note  # July 2016- 1.carcinoma of breast(left) T1 C. N0 M0 status post ultrasound-guided biopsy Estrogen receptor positive. Progesterone receptor positive.  HER-2/neu amplified by FISH (3.8)  status pos  tnipple sparing mastectomy on the left side with reconstruction (July, 2016) Final staging is  yP1C yNOsn M0  stage 1 c  2.MRSA infection associated with left breast implant (August, 2015) 3.patient started chemotherapy with [facility] from 11th August, 2015. chemotherapy on hold since November because of cellulitis in the chest wall. 4.started on Herceptin May 22, 2014.  Cause of recurrent chest wall  infection chemotherapy was put on hold. 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  Patient is on multiple antibiotic and as per infectious disease specialist similar to stay on antibiotic for 4 months. So chemotherapy has been discontinued. Maintenance Herceptin as well as patient was started on letrozole from June 12, 2014 6.  Port was removed because of Pseudomonas infection of the pocket (August, 2016) 7.  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  female with history of breast cancer currently on low-dose tamoxifen; also kidney cancer stage I is here for follow-up.  Patient interim patient underwent thyroid ultrasound [PCP] that showed 3-4 mm nodules.  Patient denies any lumps or bumps.  In the interim patient also had COVID infection.  Did not need to have admission to the hospital or/monoclonal antibody.  Mild cough.  Awaiting appointment with pulmonary next week.   Observation/objective: Alert & oriented x 3. In No acute distress.   Assessment and plan: Carcinoma of upper-outer quadrant of left breast in female, estrogen receptor positive ([place]) Stage I Breast cancer ER/PR/her 2 neu POS- currently on tamoxifen on extended therapy. On Taomoxifen 10 mg/day.  Tolerating well. Clinically no evidence of recurrence.  Stable.  #Arthritis/musculoskeletal symptoms- G-1; improved- continue Tamoxifen 43m/day.  # hot flashes/ vasomotor symptoms-from Tam-STABLE.   # BMD- [01/2019-]  T score=-1- continue calcium and vitamin D; stable.  # Right RCC-s/p cryoablation; MRI aug 2022- STABLE.  #Thyroid nodules-incidental [awaiting repeat ultrasound in 12 months/September 2022].  Clinically this does not make me suspicious of metastatic renal cancer going to the thyroid.  Agree with monitoring for now.  # DISPOSITION: mychart   #  follow up in 6 months- MD; cbc/cmp--Dr.B  Follow-up instructions:  I  discussed the assessment and treatment plan with the patient.  The patient was provided an opportunity to ask questions and all were answered.  The patient agreed with the plan and demonstrated understanding of instructions.  The patient was advised to call back or seek an in person evaluation if the symptoms worsen or if the condition fails to improve as anticipated.  Dr. Charlaine Dalton [place] at [facility] 07/16/2020 12:41 PM

## 2020-07-16 ENCOUNTER — Encounter: Payer: Self-pay | Admitting: Internal Medicine

## 2020-07-16 ENCOUNTER — Other Ambulatory Visit: Payer: Self-pay

## 2020-07-16 ENCOUNTER — Ambulatory Visit: Payer: BC Managed Care – PPO | Admitting: Internal Medicine

## 2020-07-16 VITALS — BP 136/76 | HR 85 | Temp 98.0°F | Resp 16 | Ht 66.0 in | Wt 221.8 lb

## 2020-07-16 DIAGNOSIS — Z6835 Body mass index (BMI) 35.0-35.9, adult: Secondary | ICD-10-CM | POA: Diagnosis not present

## 2020-07-16 DIAGNOSIS — R0602 Shortness of breath: Secondary | ICD-10-CM

## 2020-07-16 DIAGNOSIS — J4520 Mild intermittent asthma, uncomplicated: Secondary | ICD-10-CM | POA: Diagnosis not present

## 2020-07-16 DIAGNOSIS — R053 Chronic cough: Secondary | ICD-10-CM | POA: Diagnosis not present

## 2020-07-16 DIAGNOSIS — U099 Post covid-19 condition, unspecified: Secondary | ICD-10-CM

## 2020-07-16 MED ORDER — BREO ELLIPTA 100-25 MCG/INH IN AEPB
1.0000 | INHALATION_SPRAY | Freq: Every day | RESPIRATORY_TRACT | 2 refills | Status: DC
Start: 2020-07-16 — End: 2020-08-13

## 2020-07-16 MED ORDER — PREDNISONE 5 MG PO TABS: 5.0000 mg | ORAL_TABLET | Freq: Every day | ORAL | 0 refills | Status: DC

## 2020-07-16 NOTE — Patient Instructions (Signed)
Chronic Obstructive Pulmonary Disease  Chronic obstructive pulmonary disease (COPD) is a long-term (chronic) lung problem. When you have COPD, it is hard for air to get in and out of your lungs. Usually the condition gets worse over time, and your lungs will never return to normal. There are things you can do to keep yourself as healthy as possible. What are the causes?  Smoking. This is the most common cause.  Certain genes passed from parent to child (inherited). What increases the risk?  Being exposed to secondhand smoke from cigarettes, pipes, or cigars.  Being exposed to chemicals and other irritants, such as fumes and dust in the work environment.  Having chronic lung conditions or infections. What are the signs or symptoms?  Shortness of breath, especially during physical activity.  A long-term cough with a large amount of thick mucus. Sometimes, the cough may not have any mucus (dry cough).  Wheezing.  Breathing quickly.  Skin that looks gray or blue, especially in the fingers, toes, or lips.  Feeling tired (fatigue).  Weight loss.  Chest tightness.  Having infections often.  Episodes when breathing symptoms become much worse (exacerbations). At the later stages of this disease, you may have swelling in the ankles, feet, or legs. How is this treated?  Taking medicines.  Quitting smoking, if you smoke.  Rehabilitation. This includes steps to make your body work better. It may involve a team of specialists.  Doing exercises.  Making changes to your diet.  Using oxygen.  Lung surgery.  Lung transplant.  Comfort measures (palliative care). Follow these instructions at home: Medicines  Take over-the-counter and prescription medicines only as told by your doctor.  Talk to your doctor before taking any cough or allergy medicines. You may need to avoid medicines that cause your lungs to be dry. Lifestyle  If you smoke, stop smoking. Smoking makes the  problem worse.  Do not smoke or use any products that contain nicotine or tobacco. If you need help quitting, ask your doctor.  Avoid being around things that make your breathing worse. This may include smoke, chemicals, and fumes.  Stay active, but remember to rest as well.  Learn and use tips on how to manage stress and control your breathing.  Make sure you get enough sleep. Most adults need at least 7 hours of sleep every night.  Eat healthy foods. Eat smaller meals more often. Rest before meals. Controlled breathing Learn and use tips on how to control your breathing as told by your doctor. Try:  Breathing in (inhaling) through your nose for 1 second. Then, pucker your lips and breath out (exhale) through your lips for 2 seconds.  Putting one hand on your belly (abdomen). Breathe in slowly through your nose for 1 second. Your hand on your belly should move out. Pucker your lips and breathe out slowly through your lips. Your hand on your belly should move in as you breathe out.   Controlled coughing Learn and use controlled coughing to clear mucus from your lungs. Follow these steps: 1. Lean your head a little forward. 2. Breathe in deeply. 3. Try to hold your breath for 3 seconds. 4. Keep your mouth slightly open while coughing 2 times. 5. Spit any mucus out into a tissue. 6. Rest and do the steps again 1 or 2 times as needed. General instructions  Make sure you get all the shots (vaccines) that your doctor recommends. Ask your doctor about a flu shot and a pneumonia shot.    Use oxygen therapy and pulmonary rehabilitation if told by your doctor. If you need home oxygen therapy, ask your doctor if you should buy a tool to measure your oxygen level (oximeter).  Make a COPD action plan with your doctor. This helps you to know what to do if you feel worse than usual.  Manage any other conditions you have as told by your doctor.  Avoid going outside when it is very hot, cold, or  humid.  Avoid people who have a sickness you can catch (contagious).  Keep all follow-up visits. Contact a doctor if:  You cough up more mucus than usual.  There is a change in the color or thickness of the mucus.  It is harder to breathe than usual.  Your breathing is faster than usual.  You have trouble sleeping.  You need to use your medicines more often than usual.  You have trouble doing your normal activities such as getting dressed or walking around the house. Get help right away if:  You have shortness of breath while resting.  You have shortness of breath that stops you from: ? Being able to talk. ? Doing normal activities.  Your chest hurts for longer than 5 minutes.  Your skin color is more blue than usual.  Your pulse oximeter shows that you have low oxygen for longer than 5 minutes.  You have a fever.  You feel too tired to breathe normally. These symptoms may represent a serious problem that is an emergency. Do not wait to see if the symptoms will go away. Get medical help right away. Call your local emergency services (911 in the [place] Do not drive yourself to the hospital. Summary  Chronic obstructive pulmonary disease (COPD) is a long-term lung problem.  The way your lungs work will never return to normal. Usually the condition gets worse over time. There are things you can do to keep yourself as healthy as possible.  Take over-the-counter and prescription medicines only as told by your doctor.  If you smoke, stop. Smoking makes the problem worse. This information is not intended to replace advice given to you by your health care provider. Make sure you discuss any questions you have with your health care provider. Document Revised: 03/06/2020 Document Reviewed: 03/06/2020 Elsevier Patient Education  2021 [employer]   

## 2020-07-16 NOTE — Progress Notes (Signed)
[facility] [place], [place] [address]  Pulmonary Sleep Medicine   Office Visit Note  Patient Name: Danielle Wallace DOB: 03-30-1961 MRN [id number]  Date of Service: 07/16/2020  Complaints/HPI: Post Covid. She states that she has been having cough and wheeze symptoms. Patient states that she has had some brain fog. She also has had headaches. She states that she has history of asthma like symptoms even prior to the covid. She had a CXR done in February read as unremarkable. She also has a history of breast cancer. She did have pneumonitis at the time of treatment of the breast ca. She did not get radiation at the time. As far as the breathing she has been on albuterol no LABA or steroid was prescribed. She had oral steroids in february  ROS  General: (-) fever, (-) chills, (-) night sweats, (-) weakness Skin: (-) rashes, (-) itching,. Eyes: (-) visual changes, (-) redness, (-) itching. Nose and Sinuses: (-) nasal stuffiness or itchiness, (-) postnasal drip, (-) nosebleeds, (-) sinus trouble. Mouth and Throat: (-) sore throat, (-) hoarseness. Neck: (-) swollen glands, (-) enlarged thyroid, (-) neck pain. Respiratory: + cough, (-) bloody sputum, + shortness of breath, + wheezing. Cardiovascular: - ankle swelling, (-) chest pain. Lymphatic: (-) lymph node enlargement. Neurologic: (-) numbness, (-) tingling. Psychiatric: (-) anxiety, (-) depression   Current Medication: Outpatient Encounter Medications as of 07/16/2020  Medication Sig  . albuterol (VENTOLIN HFA) 108 (90 Base) MCG/ACT inhaler Inhale 2 puffs into the lungs every 6 (six) hours as needed for wheezing or shortness of breath.  . B COMPLEX-C PO Take by mouth daily.  . benzonatate (TESSALON) 100 MG capsule Take 1 capsule (100 mg total) by mouth 2 (two) times daily as needed for cough.  . Calcium Carb-Cholecalciferol (CALCIUM + D3) 600-200 MG-UNIT TABS Take 1 tablet by mouth 2 (two) times daily.  .  Calcium Carbonate-Vitamin D 600-200 MG-UNIT TABS Calcium 600 + D(3) 600 mg (1,500 mg)-200 unit tablet  TAKE 1 TABLET BY MOUTH 2 (TWO) TIMES DAILY.  . diphenhydrAMINE (BENADRYL) 25 MG tablet Take 25 mg by mouth every 6 (six) hours as needed.  . doxycycline (VIBRAMYCIN) 100 MG capsule Take 1 capsule (100 mg total) by mouth 2 (two) times daily.  Marland Kitchen EPINEPHrine (EPIPEN 2-PAK) 0.3 mg/0.3 mL IJ SOAJ injection Inject 0.3 mLs (0.3 mg total) into the skin as needed.  . etodolac (LODINE) 400 MG tablet TAKE 1 TABLET BY MOUTH TWICE A DAY AS NEEDED FOR PAIN  . fluticasone (FLONASE) 50 MCG/ACT nasal spray Place into both nostrils daily.  Marland Kitchen glucose blood (ONETOUCH VERIO) test strip Blood sugar testing done QD and as needed  E11.65  . imipramine (TOFRANIL) 25 MG tablet Take 2 tablets (50 mg total) by mouth at bedtime.  . insulin degludec (TRESIBA FLEXTOUCH) 100 UNIT/ML FlexTouch Pen Inject into the skin daily. Take in the evening, raise dosage by 2 units every 3 days until blood sugar is 130 or below.  . insulin lispro (HUMALOG) 100 UNIT/ML KwikPen Inject 10 Units into the skin 2 (two) times daily before a meal.  . ipratropium-albuterol (DUONEB) 0.5-2.5 (3) MG/3ML SOLN Take 3 mLs by nebulization every 6 (six) hours as needed.  . linaclotide (LINZESS) 145 MCG CAPS capsule Take 1 capsule (145 mcg total) by mouth daily before breakfast.  . nadolol (CORGARD) 80 MG tablet TAKE 1 TABLET (80 MG TOTAL) BY MOUTH DAILY. **PLEASE SCHEDULE 6 MONTH FOLLOW UP APPT**  . NOVOTWIST 32G  X 5 MM MISC Inject into the skin daily.  . Omega-3 Fatty Acids (FISH OIL OMEGA-3 PO) Take by mouth daily.  . pantoprazole (PROTONIX) 40 MG tablet TAKE 1 TABLET BY MOUTH EVERY DAY  . pravastatin (PRAVACHOL) 20 MG tablet Take 20 mg by mouth daily.  . tamoxifen (NOLVADEX) 10 MG tablet TAKE 1 TABLET BY MOUTH EVERY DAY  . valsartan-hydrochlorothiazide (DIOVAN-HCT) 80-12.5 MG tablet Take 1 tablet by mouth daily.  Marland Kitchen VITAMIN E PO Take by mouth daily.  .  predniSONE (DELTASONE) 10 MG tablet Take 1 tablet three times a day with a meal for three for three days, take 1 tablet by twice daily with a meal for 3 days, take 1 tablet once daily with a meal for 3 days (Patient not taking: Reported on 07/16/2020)   No facility-administered encounter medications on file as of 07/16/2020.    Surgical History: Past Surgical History:  Procedure Laterality Date  . BREAST BIOPSY Right 2005   negative  . BREAST BIOPSY Left 08/23/2007   negative  . BREAST BIOPSY Left 10/24/2013   positive  . BREAST BIOPSY Left 07/2002   neg  . BREAST BIOPSY Left 08/23/2007   neg  . BREAST SURGERY Left 10/2011   Cyst Aspirationapocrine metaplasia, ductal cells and bone cells, hypo-cellular  . BREAST SURGERY Left 2009   ADH on stereotactic biopsy, 1.5 mm focus.  Marland Kitchen BREAST SURGERY Left 2003   fibrocystic changes with ductal hyperplasia without atypia.  Marland Kitchen BREAST SURGERY Left    mastectomy  . BREAST SURGERY Left April 11, 2014   Removal of implant, debridement chest wall Dr.Coan  . CESAREAN SECTION  1991  . CHOLECYSTECTOMY  1990  . COLONOSCOPY  08/26/2013   Verdie Shire, M.D. normal.  . CRYOABLATION  2005, 2010  . CYST REMOVAL NECK  10/2011   Dr. Tami Ribas  . ESOPHAGOGASTRODUODENOSCOPY (EGD) WITH PROPOFOL N/A 01/16/2017   Procedure: ESOPHAGOGASTRODUODENOSCOPY (EGD) WITH PROPOFOL;  Surgeon: Lucilla Lame, MD;  Location: [facility];  Service: Gastroenterology;  Laterality: N/A;  Diabetic - oral meds  . INCISION AND DRAINAGE Left Dec 2015, Feb 2016   Dr Tula Nakayama  . MASTECTOMY Left 11/24/2013   positive  . PORTACATH PLACEMENT  11/24/13    Medical History: Past Medical History:  Diagnosis Date  . Arthritis 2006  . Asthma   . Breast cancer ([place]) 10/14/2013   18 mm, T1c, N0; ER/ PR positive,her 2 neu overexpressed. Adjuvant chemo/ herceptin.  . Cancer [facility]) 2006   Renal cell carcinoma; cryosurgery treatment, right side  . Cirrhosis ([place])   . Collapsed lung 2007  . Diabetes  mellitus without complication ([place]) 5465   Metformin  . Diffuse cystic mastopathy   . Family history of breast cancer   . Family history of liver cancer   . Motion sickness    any moving vehicle  . Orthodontics    braces  . Personal history of chemotherapy   . Sinus problem     Family History: Family History  Problem Relation Age of Onset  . Liver cancer Father   . Hemochromatosis Father   . Liver disease Father   . Diabetes Mother   . Hypertension Mother   . Breast cancer Paternal Aunt 93  . Ovarian cancer Maternal Grandmother        unk primary, metastatic cancer  . Hemachromatosis Daughter   . Liver cancer Paternal Uncle   . Hemochromatosis Paternal Uncle     Social History: Social History   Socioeconomic  History  . Marital status: Married    Spouse name: Not on file  . Number of children: Not on file  . Years of education: Not on file  . Highest education level: Not on file  Occupational History  . Not on file  Tobacco Use  . Smoking status: Never Smoker  . Smokeless tobacco: Never Used  Vaping Use  . Vaping Use: Never used  Substance and Sexual Activity  . Alcohol use: Not Currently    Comment: occasionally, none recently  . Drug use: No  . Sexual activity: Not on file  Other Topics Concern  . Not on file  Social History Narrative  . Not on file   Social Determinants of Health   Financial Resource Strain: Not on file  Food Insecurity: Not on file  Transportation Needs: Not on file  Physical Activity: Not on file  Stress: Not on file  Social Connections: Not on file  Intimate Partner Violence: Not on file    Vital Signs: Blood pressure 136/76, pulse 85, temperature 98 F (36.7 C), resp. rate 16, height 5' 6"  (1.676 m), weight 221 lb 12.8 oz (100.6 kg), SpO2 97 %.  Examination: General Appearance: The patient is well-developed, well-nourished, and in no distress. Skin: Gross inspection of skin unremarkable. Head: normocephalic, no gross  deformities. Eyes: no gross deformities noted. ENT: ears appear grossly normal no exudates. Neck: Supple. No thyromegaly. No LAD. Respiratory: few rhonchi. Cardiovascular: Normal S1 and S2 without murmur or rub. Extremities: No cyanosis. pulses are equal. Neurologic: Alert and oriented. No involuntary movements.  LABS: Recent Results (from the past 2160 hour(s))  Novel Coronavirus, NAA ([facility])     Status: None   Collection Time: 04/25/20  5:39 PM   Specimen: Nasopharyngeal(NP) swabs in vial transport medium   Nasopharynge  Screenin  Result Value Ref Range   SARS-CoV-2, NAA Not Detected Not Detected    Comment: This nucleic acid amplification test was developed and its performance characteristics determined by [employer]. Nucleic acid amplification tests include RT-PCR and TMA. This test has not been FDA cleared or approved. This test has been authorized by FDA under an Emergency Use Authorization (EUA). This test is only authorized for the duration of time the declaration that circumstances exist justifying the authorization of the emergency use of in vitro diagnostic tests for detection of SARS-CoV-2 virus and/or diagnosis of COVID-19 infection under section 564(b)(1) of the Act, 21 U.S.C. 599JTT-0(V) (1), unless the authorization is terminated or revoked sooner. When diagnostic testing is negative, the possibility of a false negative result should be considered in the context of a patient's recent exposures and the presence of clinical signs and symptoms consistent with COVID-19. An individual without symptoms of COVID-19 and who is not shedding SARS-CoV-2 virus wo uld expect to have a negative (not detected) result in this assay.   SARS-COV-2, NAA 2 DAY TAT     Status: None   Collection Time: 04/25/20  5:39 PM   Nasopharynge  Screenin  Result Value Ref Range   SARS-CoV-2, NAA 2 DAY TAT Performed   POCT HgB A1C     Status: Abnormal   Collection Time: 07/04/20   2:33 PM  Result Value Ref Range   Hemoglobin A1C 8.6 (A) 4.0 - 5.6 %   HbA1c POC (<> result, manual entry)     HbA1c, POC (prediabetic range)     HbA1c, POC (controlled diabetic range)      Radiology: DG Chest 2 View  Result Date:  07/02/2020 CLINICAL DATA:  COVID positive 06/19/2020 with productive cough as of Friday. EXAM: CHEST - 2 VIEW COMPARISON:  Chest radiograph August 06, 2017 and chest CT October 27, 2016 FINDINGS: The heart size and mediastinal contours are within normal limits. No focal consolidation. No pleural effusion. No pneumothorax. Calcific density in the region of the right rotator cuff likely calcific rotator cuff tendinosis. Mild bilateral AC joint and glenohumeral joint degenerative change. Thoracic spondylosis. IMPRESSION: No acute cardiopulmonary disease. Electronically Signed   By: Dahlia Bailiff MD   On: 07/02/2020 11:34    No results found.  DG Chest 2 View  Result Date: 07/02/2020 CLINICAL DATA:  COVID positive 06/19/2020 with productive cough as of Friday. EXAM: CHEST - 2 VIEW COMPARISON:  Chest radiograph August 06, 2017 and chest CT October 27, 2016 FINDINGS: The heart size and mediastinal contours are within normal limits. No focal consolidation. No pleural effusion. No pneumothorax. Calcific density in the region of the right rotator cuff likely calcific rotator cuff tendinosis. Mild bilateral AC joint and glenohumeral joint degenerative change. Thoracic spondylosis. IMPRESSION: No acute cardiopulmonary disease. Electronically Signed   By: Dahlia Bailiff MD   On: 07/02/2020 11:34      Assessment and Plan: Patient Active Problem List   Diagnosis Date Noted  . Complex tear of medial meniscus of right knee as current injury 03/04/2020  . Aortic atherosclerosis ([place]) 03/04/2020  . Encounter for general adult medical examination with abnormal findings 01/31/2020  . Primary osteoarthritis of both knees 01/31/2020  . Degenerative disc disease, lumbar 01/31/2020  .  Genetic testing 11/16/2019  . Family history of breast cancer   . Family history of liver cancer   . Multinodular goiter 10/10/2019  . Thyromegaly 09/19/2019  . Allergic reaction 07/01/2019  . Breast cancer of upper-outer quadrant of left female breast ([place]) 10/27/2018  . Routine cervical smear 10/19/2018  . Uncontrolled type 2 diabetes mellitus with hyperglycemia ([place]) 10/19/2018  . Gastroesophageal reflux disease with esophagitis 10/19/2018  . Stomatitis and mucositis 10/19/2018  . Urinary tract infection without hematuria 10/19/2018  . Dysuria 10/19/2018  . Need for vaccination against Streptococcus pneumoniae using pneumococcal conjugate vaccine 13 04/21/2018  . Acute bronchitis 10/01/2017  . Wheezing 10/01/2017  . Sore throat 10/01/2017  . Fatigue 09/23/2017  . Vitamin D deficiency 09/23/2017  . Anemia 09/23/2017  . Acute non-recurrent pansinusitis 08/12/2017  . Type 2 diabetes mellitus with hyperglycemia, without long-term current use of insulin ([place]) 08/12/2017  . [place] Patient virus infection 08/12/2017  . Essential hypertension 05/18/2017  . Abnormal CT scan, stomach   . Gastric varices   . Cellulitis of female breast 12/11/2016  . Open wound of breast 12/11/2016  . Abdominal distention 01/23/2016  . 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 12/12/2014  . Nausea 09/22/2014  . EN (erythema nodosum) 07/26/2014  . 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 Personal history of renal cancer 05/23/2014  . Personal history of other malignant neoplasm of kidney 05/23/2014  . Right flank pain 05/23/2014  . Carcinoma of upper-outer quadrant of left breast in female, estrogen receptor positive ([place]) 10/14/2013  . Calcium blood increased 06/25/2012  . Cancer of kidney ([place]) 06/25/2012  . Malignant neoplasm of kidney ([place]) 06/25/2012  . Female stress incontinence 06/25/2012  . Diabetes mellitus without complication ([place]) 24/01/7352    1. COVID-19 long hauler manifesting chronic cough Will  place on steroids and also will start on breo also  2. SOB (shortness of breath)  - Spirometry with Graph  3. Chronic asthma, mild intermittent, uncomplicated FEV1 is significantly reduced and she has ongoing wheeze and cough. She will benefit from inhaled laba steroid combination  4. BMI 35.0-35.9,adult Obesity Counseling: Had a lengthy discussion regarding patients BMI and weight issues. Patient was instructed on portion control as well as increased activity. Also discussed caloric restrictions with trying to maintain intake less than 2000 Kcal. Discussions were made in accordance with the 5As of weight management. Simple actions such as not eating late and if able to, taking a walk is suggested.  General Counseling: I have discussed the findings of the evaluation and examination with Selinda Eon.  I have also discussed any further diagnostic evaluation thatmay be needed or ordered today. Teirra verbalizes understanding of the findings of todays visit. We also reviewed her medications today and discussed drug interactions and side effects including but not limited excessive drowsiness and altered mental states. We also discussed that there is always a risk not just to her but also people around her. she has been encouraged to call the office with any questions or concerns that should arise related to todays visit.  Orders Placed This Encounter  Procedures  . Spirometry with Graph    Order Specific Question:   Where should this test be performed?    Answer:   [facility]    Order Specific Question:   Basic spirometry    Answer:   Yes    Order Specific Question:   Spirometry pre & post bronchodilator    Answer:   No     Time spent: 27  I have personally obtained a history, examined the patient, evaluated laboratory and imaging results, formulated the assessment and plan and placed orders.    Allyne Gee, MD [facility] Pulmonary and Critical Care Sleep medicine

## 2020-07-29 ENCOUNTER — Other Ambulatory Visit: Payer: Self-pay | Admitting: Hospice and Palliative Medicine

## 2020-07-31 ENCOUNTER — Encounter: Payer: Self-pay | Admitting: Internal Medicine

## 2020-07-31 ENCOUNTER — Other Ambulatory Visit: Payer: Self-pay | Admitting: Hospice and Palliative Medicine

## 2020-08-07 ENCOUNTER — Other Ambulatory Visit: Payer: Self-pay | Admitting: Internal Medicine

## 2020-08-07 DIAGNOSIS — J4520 Mild intermittent asthma, uncomplicated: Secondary | ICD-10-CM

## 2020-08-08 ENCOUNTER — Other Ambulatory Visit: Payer: Self-pay | Admitting: Internal Medicine

## 2020-08-08 DIAGNOSIS — M159 Polyosteoarthritis, unspecified: Secondary | ICD-10-CM

## 2020-08-09 ENCOUNTER — Other Ambulatory Visit: Payer: Self-pay | Admitting: Nurse Practitioner

## 2020-08-13 ENCOUNTER — Other Ambulatory Visit: Payer: Self-pay | Admitting: Gastroenterology

## 2020-08-13 ENCOUNTER — Other Ambulatory Visit: Payer: Self-pay | Admitting: Nurse Practitioner

## 2020-08-13 ENCOUNTER — Encounter: Payer: Self-pay | Admitting: Internal Medicine

## 2020-08-13 ENCOUNTER — Ambulatory Visit: Payer: BC Managed Care – PPO | Admitting: Internal Medicine

## 2020-08-13 ENCOUNTER — Telehealth: Payer: Self-pay

## 2020-08-13 VITALS — BP 140/70 | HR 72 | Temp 97.2°F | Resp 16 | Ht 66.0 in | Wt 222.4 lb

## 2020-08-13 DIAGNOSIS — R0602 Shortness of breath: Secondary | ICD-10-CM

## 2020-08-13 DIAGNOSIS — E1165 Type 2 diabetes mellitus with hyperglycemia: Secondary | ICD-10-CM

## 2020-08-13 DIAGNOSIS — J4520 Mild intermittent asthma, uncomplicated: Secondary | ICD-10-CM

## 2020-08-13 DIAGNOSIS — K580 Irritable bowel syndrome with diarrhea: Secondary | ICD-10-CM

## 2020-08-13 DIAGNOSIS — Z6835 Body mass index (BMI) 35.0-35.9, adult: Secondary | ICD-10-CM

## 2020-08-13 MED ORDER — BREO ELLIPTA 100-25 MCG/INH IN AEPB: 1.0000 | INHALATION_SPRAY | Freq: Every day | RESPIRATORY_TRACT | 2 refills | Status: DC

## 2020-08-13 MED ORDER — METFORMIN HCL ER 500 MG PO TB24: ORAL_TABLET | ORAL | 1 refills | Status: DC

## 2020-08-13 NOTE — Telephone Encounter (Signed)
Lmom for patient CT chest scheduled at [facility] for 08/27/20 at 8:30. Danielle Wallace

## 2020-08-13 NOTE — Progress Notes (Signed)
[facility] [place], [place] [address]  Internal MEDICINE  Office Visit Note  Patient Name: Danielle Wallace  [id number]  [id number]  Date of Service: 08/20/2020  Chief Complaint  Patient presents with  . Follow-up    Not nearly as fatigued and breathing is easier, not coughing as bad but still has cough  . Asthma    HPI Pt is here for follow up 1. Ongoing cough and sob, poor energy and fatigue. She continues to have chest congestion and cough. Wheezing is present as well, she has not felt well after COVID infection 2. Unhappy with her diabetic control, fluctuations and continues to gain weight. Had side effects to increased dose of metformin and she is now on large amount of insulin  3. Unable to exercise due to excessive fatigue   Current Medication: Outpatient Encounter Medications as of 08/13/2020  Medication Sig  . metFORMIN (GLUCOPHAGE-XR) 500 MG 24 hr tablet Take one tab po qd with supper  . B COMPLEX-C PO Take by mouth daily.  . benzonatate (TESSALON) 100 MG capsule Take 1 capsule (100 mg total) by mouth 2 (two) times daily as needed for cough.  . Calcium Carb-Cholecalciferol (CALCIUM + D3) 600-200 MG-UNIT TABS Take 1 tablet by mouth 2 (two) times daily.  . Calcium Carbonate-Vitamin D 600-200 MG-UNIT TABS Calcium 600 + D(3) 600 mg (1,500 mg)-200 unit tablet  TAKE 1 TABLET BY MOUTH 2 (TWO) TIMES DAILY.  . diphenhydrAMINE (BENADRYL) 25 MG tablet Take 25 mg by mouth every 6 (six) hours as needed.  . doxycycline (VIBRAMYCIN) 100 MG capsule Take 1 capsule (100 mg total) by mouth 2 (two) times daily.  Marland Kitchen EPINEPHrine (EPIPEN 2-PAK) 0.3 mg/0.3 mL IJ SOAJ injection Inject 0.3 mLs (0.3 mg total) into the skin as needed.  . etodolac (LODINE) 400 MG tablet TAKE 1 TABLET BY MOUTH TWICE A DAY AS NEEDED FOR PAIN  . fluticasone (FLONASE) 50 MCG/ACT nasal spray Place into both nostrils daily.  . fluticasone furoate-vilanterol (BREO ELLIPTA) 100-25 MCG/INH AEPB Inhale  1 puff into the lungs daily.  Marland Kitchen glucose blood (ONETOUCH VERIO) test strip Blood sugar testing done QD and as needed  E11.65  . insulin degludec (TRESIBA FLEXTOUCH) 100 UNIT/ML FlexTouch Pen Inject into the skin daily. Take in the evening, raise dosage by 2 units every 3 days until blood sugar is 130 or below.  . insulin lispro (HUMALOG) 100 UNIT/ML KwikPen Inject 10 Units into the skin 2 (two) times daily before a meal.  . ipratropium-albuterol (DUONEB) 0.5-2.5 (3) MG/3ML SOLN USE 1 VIAL IN NEBULIZER EVERY 6 HOURS AS NEEDED  . NOVOTWIST 32G X 5 MM MISC Inject into the skin daily.  . Omega-3 Fatty Acids (FISH OIL OMEGA-3 PO) Take by mouth daily.  . pantoprazole (PROTONIX) 40 MG tablet TAKE 1 TABLET BY MOUTH EVERY DAY  . pravastatin (PRAVACHOL) 20 MG tablet Take 20 mg by mouth daily.  . predniSONE (DELTASONE) 5 MG tablet Take 1 tablet (5 mg total) by mouth daily with breakfast. Take 58m day1-2 then taper by 519mevery other day  . tamoxifen (NOLVADEX) 10 MG tablet TAKE 1 TABLET BY MOUTH EVERY DAY  . valsartan-hydrochlorothiazide (DIOVAN-HCT) 80-12.5 MG tablet Take 1 tablet by mouth daily.  . Marland KitchenITAMIN E PO Take by mouth daily.  . [DISCONTINUED] albuterol (VENTOLIN HFA) 108 (90 Base) MCG/ACT inhaler Inhale 2 puffs into the lungs every 6 (six) hours as needed for wheezing or shortness of breath.  . [DISCONTINUED] fluticasone furoate-vilanterol (  BREO ELLIPTA) 100-25 MCG/INH AEPB Inhale 1 puff into the lungs daily.  . [DISCONTINUED] imipramine (TOFRANIL) 25 MG tablet Take 2 tablets (50 mg total) by mouth at bedtime.  . [DISCONTINUED] linaclotide (LINZESS) 145 MCG CAPS capsule Take 1 capsule (145 mcg total) by mouth daily before breakfast.  . [DISCONTINUED] nadolol (CORGARD) 80 MG tablet TAKE 1 TABLET (80 MG TOTAL) BY MOUTH DAILY. **PLEASE SCHEDULE 6 MONTH FOLLOW UP APPT**   No facility-administered encounter medications on file as of 08/13/2020.    Surgical History: Past Surgical History:  Procedure  Laterality Date  . BREAST BIOPSY Right 2005   negative  . BREAST BIOPSY Left 08/23/2007   negative  . BREAST BIOPSY Left 10/24/2013   positive  . BREAST BIOPSY Left 07/2002   neg  . BREAST BIOPSY Left 08/23/2007   neg  . BREAST SURGERY Left 10/2011   Cyst Aspirationapocrine metaplasia, ductal cells and bone cells, hypo-cellular  . BREAST SURGERY Left 2009   ADH on stereotactic biopsy, 1.5 mm focus.  Marland Kitchen BREAST SURGERY Left 2003   fibrocystic changes with ductal hyperplasia without atypia.  Marland Kitchen BREAST SURGERY Left    mastectomy  . BREAST SURGERY Left April 11, 2014   Removal of implant, debridement chest wall Dr.Coan  . CESAREAN SECTION  1991  . CHOLECYSTECTOMY  1990  . COLONOSCOPY  08/26/2013   Verdie Shire, M.D. normal.  . CRYOABLATION  2005, 2010  . CYST REMOVAL NECK  10/2011   Dr. Tami Ribas  . ESOPHAGOGASTRODUODENOSCOPY (EGD) WITH PROPOFOL N/A 01/16/2017   Procedure: ESOPHAGOGASTRODUODENOSCOPY (EGD) WITH PROPOFOL;  Surgeon: Lucilla Lame, MD;  Location: [facility];  Service: Gastroenterology;  Laterality: N/A;  Diabetic - oral meds  . INCISION AND DRAINAGE Left Dec 2015, Feb 2016   Dr Tula Nakayama  . MASTECTOMY Left 11/24/2013   positive  . PORTACATH PLACEMENT  11/24/13    Medical History: Past Medical History:  Diagnosis Date  . Arthritis 2006  . Asthma   . Breast cancer ([place]) 10/14/2013   18 mm, T1c, N0; ER/ PR positive,her 2 neu overexpressed. Adjuvant chemo/ herceptin.  . Cancer [facility]) 2006   Renal cell carcinoma; cryosurgery treatment, right side  . Cirrhosis ([place])   . Collapsed lung 2007  . Diabetes mellitus without complication ([place]) 0865   Metformin  . Diffuse cystic mastopathy   . Family history of breast cancer   . Family history of liver cancer   . Motion sickness    any moving vehicle  . Orthodontics    braces  . Personal history of chemotherapy   . Sinus problem     Family History: Family History  Problem Relation Age of Onset  . Liver cancer Father   .  Hemochromatosis Father   . Liver disease Father   . Diabetes Mother   . Hypertension Mother   . Breast cancer Paternal Aunt 47  . Ovarian cancer Maternal Grandmother        unk primary, metastatic cancer  . Hemachromatosis Daughter   . Liver cancer Paternal Uncle   . Hemochromatosis Paternal Uncle     Social History   Socioeconomic History  . Marital status: Married    Spouse name: Not on file  . Number of children: Not on file  . Years of education: Not on file  . Highest education level: Not on file  Occupational History  . Not on file  Tobacco Use  . Smoking status: Never Smoker  . Smokeless tobacco: Never Used  Vaping Use  .  Vaping Use: Never used  Substance and Sexual Activity  . Alcohol use: Not Currently    Comment: occasionally, none recently  . Drug use: No  . Sexual activity: Not on file  Other Topics Concern  . Not on file  Social History Narrative  . Not on file   Social Determinants of Health   Financial Resource Strain: Not on file  Food Insecurity: Not on file  Transportation Needs: Not on file  Physical Activity: Not on file  Stress: Not on file  Social Connections: Not on file  Intimate Partner Violence: Not on file      Review of Systems  Constitutional: Positive for fatigue. Negative for chills and diaphoresis.  HENT: Negative for ear pain, postnasal drip and sinus pressure.   Eyes: Negative for photophobia, discharge, redness, itching and visual disturbance.  Respiratory: Positive for cough and shortness of breath. Negative for wheezing.   Cardiovascular: Negative for chest pain, palpitations and leg swelling.  Gastrointestinal: Negative for abdominal pain, constipation, diarrhea, nausea and vomiting.  Endocrine:       Elevated blood sugars along with periods of hypoglycemia   Genitourinary: Negative for dysuria and flank pain.  Musculoskeletal: Negative for arthralgias, back pain, gait problem and neck pain.  Skin: Negative for color  change.  Allergic/Immunologic: Negative for environmental allergies and food allergies.  Neurological: Negative for dizziness and headaches.  Hematological: Does not bruise/bleed easily.  Psychiatric/Behavioral: Negative for agitation, behavioral problems (depression) and hallucinations.    Vital Signs: BP 140/70   Pulse 72   Temp (!) 97.2 F (36.2 C)   Resp 16   Ht 5' 6"  (1.676 m)   Wt 222 lb 6.4 oz (100.9 kg)   SpO2 98%   BMI 35.90 kg/m    Physical Exam Constitutional:      General: She is not in acute distress.    Appearance: She is well-developed. She is not diaphoretic.  HENT:     Head: Normocephalic and atraumatic.     Mouth/Throat:     Pharynx: No oropharyngeal exudate.  Eyes:     Pupils: Pupils are equal, round, and reactive to light.  Neck:     Thyroid: No thyromegaly.     Vascular: No JVD.     Trachea: No tracheal deviation.  Cardiovascular:     Rate and Rhythm: Normal rate and regular rhythm.     Heart sounds: Normal heart sounds. No murmur heard. No friction rub. No gallop.   Pulmonary:     Effort: Pulmonary effort is normal. No respiratory distress.     Breath sounds: Wheezing and rhonchi present. No rales.  Chest:     Chest wall: No tenderness.  Musculoskeletal:        General: Normal range of motion.     Cervical back: Normal range of motion and neck supple.  Lymphadenopathy:     Cervical: No cervical adenopathy.  Skin:    General: Skin is warm and dry.  Neurological:     Mental Status: She is alert and oriented to person, place, and time.     Cranial Nerves: No cranial nerve deficit.  Psychiatric:        Behavior: Behavior normal.        Thought Content: Thought content normal.        Judgment: Judgment normal.      Assessment/Plan: 1. SOB (shortness of breath) Pt has ongoing symptoms, needs further work up. Will order diagnostics  - Spirometry with Graph - CT Chest  Wo Contrast; Future - Pulmonary Function Test; Future  2. Chronic  asthma, mild intermittent, uncomplicated Start BREO until  - Spirometry with Graph - CT Chest Wo Contrast; Future - fluticasone furoate-vilanterol (BREO ELLIPTA) 100-25 MCG/INH AEPB; Inhale 1 puff into the lungs daily.  Dispense: 1 each; Refill: 2  3. Uncontrolled type 2 diabetes mellitus with hyperglycemia (HCC) Start Ozempic 0.25 mg then 0.5 mg once a week, will try titrating it on next visit, Decrease Triseba to 40 units, Humalog qith breakfast and lunch  - metFORMIN (GLUCOPHAGE-XR) 500 MG 24 hr tablet; Take one tab po qd with supper  Dispense: 90 tablet; Refill: 1  4. BMI 35.0-35.9,adult Obesity Counseling: Risk Assessment: An assessment of behavioral risk factors was made today and includes lack of exercise sedentary lifestyle, lack of portion control and poor dietary habits.  Risk Modification Advice: She was counseled on portion control guidelines. Restricting daily caloric intake to1500 The detrimental long term effects of obesity on her health and ongoing poor compliance was also discussed with the patient.    General Counseling: kennia vanvorst understanding of the findings of todays visit and agrees with plan of treatment. I have discussed any further diagnostic evaluation that may be needed or ordered today. We also reviewed her medications today. she has been encouraged to call the office with any questions or concerns that should arise related to todays visit.    Orders Placed This Encounter  Procedures  . CT Chest Wo Contrast  . Spirometry with Graph  . Pulmonary Function Test    Meds ordered this encounter  Medications  . fluticasone furoate-vilanterol (BREO ELLIPTA) 100-25 MCG/INH AEPB    Sig: Inhale 1 puff into the lungs daily.    Dispense:  1 each    Refill:  2  . metFORMIN (GLUCOPHAGE-XR) 500 MG 24 hr tablet    Sig: Take one tab po qd with supper    Dispense:  90 tablet    Refill:  1    Total time spent:35  Minutes Time spent includes review of chart,  medications, test results, and follow up plan with the patient.   [place] Controlled Substance Database was reviewed by me.   Dr Lavera Guise Internal medicine

## 2020-08-14 ENCOUNTER — Other Ambulatory Visit: Payer: Self-pay | Admitting: Internal Medicine

## 2020-08-14 DIAGNOSIS — K580 Irritable bowel syndrome with diarrhea: Secondary | ICD-10-CM

## 2020-08-17 ENCOUNTER — Other Ambulatory Visit: Payer: Self-pay

## 2020-08-17 MED ORDER — NADOLOL 80 MG PO TABS: 80.0000 mg | ORAL_TABLET | Freq: Every day | ORAL | 3 refills | Status: DC

## 2020-08-20 ENCOUNTER — Other Ambulatory Visit: Payer: Self-pay | Admitting: Hospice and Palliative Medicine

## 2020-08-20 ENCOUNTER — Encounter: Payer: Self-pay | Admitting: Internal Medicine

## 2020-08-21 ENCOUNTER — Encounter: Payer: Self-pay | Admitting: Internal Medicine

## 2020-08-21 ENCOUNTER — Other Ambulatory Visit: Payer: Self-pay

## 2020-08-21 MED ORDER — OZEMPIC (0.25 OR 0.5 MG/DOSE) 2 MG/1.5ML ~~LOC~~ SOPN: 0.5000 mg | PEN_INJECTOR | SUBCUTANEOUS | 3 refills | Status: DC

## 2020-08-27 ENCOUNTER — Other Ambulatory Visit: Payer: Self-pay

## 2020-08-27 ENCOUNTER — Ambulatory Visit
Admission: RE | Admit: 2020-08-27 | Discharge: 2020-08-27 | Disposition: A | Discharge status: Home / Self Care | Payer: BC Managed Care – PPO | Source: Ambulatory Visit | Attending: Internal Medicine | Admitting: Internal Medicine

## 2020-08-27 DIAGNOSIS — R0602 Shortness of breath: Secondary | ICD-10-CM | POA: Diagnosis present

## 2020-08-27 DIAGNOSIS — J4520 Mild intermittent asthma, uncomplicated: Secondary | ICD-10-CM | POA: Insufficient documentation

## 2020-08-28 ENCOUNTER — Other Ambulatory Visit: Payer: Self-pay | Admitting: Hospice and Palliative Medicine

## 2020-08-29 ENCOUNTER — Ambulatory Visit: Payer: BC Managed Care – PPO | Admitting: Gastroenterology

## 2020-08-31 ENCOUNTER — Encounter: Payer: Self-pay | Admitting: Hospice and Palliative Medicine

## 2020-08-31 ENCOUNTER — Ambulatory Visit: Payer: BC Managed Care – PPO | Admitting: Hospice and Palliative Medicine

## 2020-08-31 ENCOUNTER — Other Ambulatory Visit: Payer: Self-pay

## 2020-08-31 VITALS — BP 112/80 | HR 86 | Temp 97.1°F | Ht 66.0 in | Wt 236.6 lb

## 2020-08-31 DIAGNOSIS — I10 Essential (primary) hypertension: Secondary | ICD-10-CM | POA: Diagnosis not present

## 2020-08-31 DIAGNOSIS — I700 Atherosclerosis of aorta: Secondary | ICD-10-CM | POA: Diagnosis not present

## 2020-08-31 DIAGNOSIS — E1165 Type 2 diabetes mellitus with hyperglycemia: Secondary | ICD-10-CM

## 2020-08-31 NOTE — Progress Notes (Signed)
[facility] [place], [place] [address]  Internal MEDICINE  Office Visit Note  Patient Name: Danielle Wallace  [id number]  [id number]  Date of Service: 09/09/2020  Chief Complaint  Patient presents with  . Diabetes    Med check  . Arthritis  . Asthma    HPI Patient is here for routine follow-up Feeling well today, explains she is feeling the best she has felt in months Diabetic medicines have been recently adjusted and has noticed a significant improvement in her glucose levels Has stopped Humalog about a week after her last visit since medication changes, tolerating Ozempic well   Current Medication: Outpatient Encounter Medications as of 08/31/2020  Medication Sig  . albuterol (VENTOLIN HFA) 108 (90 Base) MCG/ACT inhaler TAKE 2 PUFFS BY MOUTH EVERY 6 HOURS AS NEEDED FOR WHEEZE OR SHORTNESS OF BREATH  . B COMPLEX-C PO Take by mouth daily.  . benzonatate (TESSALON) 100 MG capsule Take 1 capsule (100 mg total) by mouth 2 (two) times daily as needed for cough.  . Calcium Carb-Cholecalciferol (CALCIUM + D3) 600-200 MG-UNIT TABS Take 1 tablet by mouth 2 (two) times daily.  . Calcium Carbonate-Vitamin D 600-200 MG-UNIT TABS Calcium 600 + D(3) 600 mg (1,500 mg)-200 unit tablet  TAKE 1 TABLET BY MOUTH 2 (TWO) TIMES DAILY.  . diphenhydrAMINE (BENADRYL) 25 MG tablet Take 25 mg by mouth every 6 (six) hours as needed.  . doxycycline (VIBRAMYCIN) 100 MG capsule Take 1 capsule (100 mg total) by mouth 2 (two) times daily.  Marland Kitchen EPINEPHrine (EPIPEN 2-PAK) 0.3 mg/0.3 mL IJ SOAJ injection Inject 0.3 mLs (0.3 mg total) into the skin as needed.  . etodolac (LODINE) 400 MG tablet TAKE 1 TABLET BY MOUTH TWICE A DAY AS NEEDED FOR PAIN  . fluticasone (FLONASE) 50 MCG/ACT nasal spray Place into both nostrils daily.  . fluticasone furoate-vilanterol (BREO ELLIPTA) 100-25 MCG/INH AEPB Inhale 1 puff into the lungs daily.  Marland Kitchen glucose blood (ONETOUCH VERIO) test strip Blood sugar testing  done QD and as needed  E11.65  . imipramine (TOFRANIL) 25 MG tablet TAKE 2 TABLETS BY MOUTH AT BEDTIME.  . insulin degludec (TRESIBA FLEXTOUCH) 100 UNIT/ML FlexTouch Pen Inject into the skin daily. Take in the evening, raise dosage by 2 units every 3 days until blood sugar is 130 or below.  . insulin lispro (HUMALOG) 100 UNIT/ML KwikPen Inject 10 Units into the skin 2 (two) times daily before a meal.  . ipratropium-albuterol (DUONEB) 0.5-2.5 (3) MG/3ML SOLN USE 1 VIAL IN NEBULIZER EVERY 6 HOURS AS NEEDED  . LINZESS 145 MCG CAPS capsule TAKE 1 CAPSULE BY MOUTH DAILY BEFORE BREAKFAST.  . metFORMIN (GLUCOPHAGE-XR) 500 MG 24 hr tablet Take one tab po qd with supper  . nadolol (CORGARD) 80 MG tablet Take 1 tablet (80 mg total) by mouth daily.  Marland Kitchen NOVOTWIST 32G X 5 MM MISC Inject into the skin daily.  . Omega-3 Fatty Acids (FISH OIL OMEGA-3 PO) Take by mouth daily.  . pantoprazole (PROTONIX) 40 MG tablet TAKE 1 TABLET BY MOUTH EVERY DAY  . pravastatin (PRAVACHOL) 20 MG tablet Take 20 mg by mouth daily.  . predniSONE (DELTASONE) 5 MG tablet Take 1 tablet (5 mg total) by mouth daily with breakfast. Take 75m day1-2 then taper by 515mevery other day  . Semaglutide,0.25 or 0.5MG/DOS, (OZEMPIC, 0.25 OR 0.5 MG/DOSE,) 2 MG/1.5ML SOPN Inject 0.5 mg into the skin once a week. Inject 0.5 mg once a week  . tamoxifen (NOLVADEX)  10 MG tablet TAKE 1 TABLET BY MOUTH EVERY DAY  . valsartan-hydrochlorothiazide (DIOVAN-HCT) 80-12.5 MG tablet Take 1 tablet by mouth daily.  Marland Kitchen VITAMIN E PO Take by mouth daily.   No facility-administered encounter medications on file as of 08/31/2020.    Surgical History: Past Surgical History:  Procedure Laterality Date  . BREAST BIOPSY Right 2005   negative  . BREAST BIOPSY Left 08/23/2007   negative  . BREAST BIOPSY Left 10/24/2013   positive  . BREAST BIOPSY Left 07/2002   neg  . BREAST BIOPSY Left 08/23/2007   neg  . BREAST SURGERY Left 10/2011   Cyst Aspirationapocrine  metaplasia, ductal cells and bone cells, hypo-cellular  . BREAST SURGERY Left 2009   ADH on stereotactic biopsy, 1.5 mm focus.  Marland Kitchen BREAST SURGERY Left 2003   fibrocystic changes with ductal hyperplasia without atypia.  Marland Kitchen BREAST SURGERY Left    mastectomy  . BREAST SURGERY Left April 11, 2014   Removal of implant, debridement chest wall Dr.Coan  . CESAREAN SECTION  1991  . CHOLECYSTECTOMY  1990  . COLONOSCOPY  08/26/2013   Verdie Shire, M.D. normal.  . CRYOABLATION  2005, 2010  . CYST REMOVAL NECK  10/2011   Dr. Tami Ribas  . ESOPHAGOGASTRODUODENOSCOPY (EGD) WITH PROPOFOL N/A 01/16/2017   Procedure: ESOPHAGOGASTRODUODENOSCOPY (EGD) WITH PROPOFOL;  Surgeon: Lucilla Lame, MD;  Location: [facility];  Service: Gastroenterology;  Laterality: N/A;  Diabetic - oral meds  . INCISION AND DRAINAGE Left Dec 2015, Feb 2016   Dr Tula Nakayama  . MASTECTOMY Left 11/24/2013   positive  . PORTACATH PLACEMENT  11/24/13    Medical History: Past Medical History:  Diagnosis Date  . Arthritis 2006  . Asthma   . Breast cancer ([place]) 10/14/2013   18 mm, T1c, N0; ER/ PR positive,her 2 neu overexpressed. Adjuvant chemo/ herceptin.  . Cancer [facility]) 2006   Renal cell carcinoma; cryosurgery treatment, right side  . Cirrhosis ([place])   . Collapsed lung 2007  . Diabetes mellitus without complication ([place]) 8315   Metformin  . Diffuse cystic mastopathy   . Family history of breast cancer   . Family history of liver cancer   . Motion sickness    any moving vehicle  . Orthodontics    braces  . Personal history of chemotherapy   . Sinus problem     Family History: Family History  Problem Relation Age of Onset  . Liver cancer Father   . Hemochromatosis Father   . Liver disease Father   . Diabetes Mother   . Hypertension Mother   . Breast cancer Paternal Aunt 10  . Ovarian cancer Maternal Grandmother        unk primary, metastatic cancer  . Hemachromatosis Daughter   . Liver cancer Paternal Uncle   .  Hemochromatosis Paternal Uncle     Social History   Socioeconomic History  . Marital status: Married    Spouse name: Not on file  . Number of children: Not on file  . Years of education: Not on file  . Highest education level: Not on file  Occupational History  . Not on file  Tobacco Use  . Smoking status: Never Smoker  . Smokeless tobacco: Never Used  Vaping Use  . Vaping Use: Never used  Substance and Sexual Activity  . Alcohol use: Not Currently    Comment: occasionally, none recently  . Drug use: No  . Sexual activity: Not on file  Other Topics Concern  .  Not on file  Social History Narrative  . Not on file   Social Determinants of Health   Financial Resource Strain: Not on file  Food Insecurity: Not on file  Transportation Needs: Not on file  Physical Activity: Not on file  Stress: Not on file  Social Connections: Not on file  Intimate Partner Violence: Not on file      Review of Systems  Constitutional: Negative for chills, diaphoresis and fatigue.  HENT: Negative for ear pain, postnasal drip and sinus pressure.   Eyes: Negative for photophobia, discharge, redness, itching and visual disturbance.  Respiratory: Negative for cough, shortness of breath and wheezing.   Cardiovascular: Negative for chest pain, palpitations and leg swelling.  Gastrointestinal: Negative for abdominal pain, constipation, diarrhea, nausea and vomiting.  Genitourinary: Negative for dysuria and flank pain.  Musculoskeletal: Negative for arthralgias, back pain, gait problem and neck pain.  Skin: Negative for color change.  Allergic/Immunologic: Negative for environmental allergies and food allergies.  Neurological: Negative for dizziness and headaches.  Hematological: Does not bruise/bleed easily.  Psychiatric/Behavioral: Negative for agitation, behavioral problems (depression) and hallucinations.    Vital Signs: BP 112/80   Pulse 86   Temp (!) 97.1 F (36.2 C)   Ht 5' 6"   (1.676 m)   Wt 236 lb 9.6 oz (107.3 kg)   SpO2 95%   BMI 38.19 kg/m    Physical Exam Vitals reviewed.  Constitutional:      Appearance: Normal appearance. She is normal weight.  Cardiovascular:     Rate and Rhythm: Normal rate and regular rhythm.     Pulses: Normal pulses.     Heart sounds: Normal heart sounds.  Pulmonary:     Effort: Pulmonary effort is normal.     Breath sounds: Normal breath sounds.  Abdominal:     General: Abdomen is flat.     Palpations: Abdomen is soft.  Musculoskeletal:        General: Normal range of motion.     Cervical back: Normal range of motion.  Skin:    General: Skin is warm.  Neurological:     General: No focal deficit present.     Mental Status: She is alert and oriented to person, place, and time. Mental status is at baseline.  Psychiatric:        Mood and Affect: Mood normal.        Behavior: Behavior normal.        Thought Content: Thought content normal.        Judgment: Judgment normal.    Assessment/Plan: 1. Uncontrolled type 2 diabetes mellitus with hyperglycemia (HCC) Glucose levels significantly improved with recent med changes Will be due for A1C testing at next visit  2. Essential hypertension BP and HR continue to be well controlled on present management, continue to monitor  3. Aortic atherosclerosis ([place]) Continue with statin therapy, tolerating well  General Counseling: Aqua verbalizes understanding of the findings of todays visit and agrees with plan of treatment. I have discussed any further diagnostic evaluation that may be needed or ordered today. We also reviewed her medications today. she has been encouraged to call the office with any questions or concerns that should arise related to todays visit.   Time spent: 30 Minutes Time spent includes review of chart, medications, test results and follow-up plan with the patient.  This patient was seen by Theodoro Grist AGNP-C in Collaboration with Dr Lavera Guise  as a part of collaborative care agreement  Tanna Furry Naarah Borgerding AGNP-C Internal medicine

## 2020-09-12 ENCOUNTER — Other Ambulatory Visit: Payer: Self-pay | Admitting: Hospice and Palliative Medicine

## 2020-09-12 ENCOUNTER — Ambulatory Visit: Payer: BC Managed Care – PPO | Admitting: Internal Medicine

## 2020-09-12 ENCOUNTER — Other Ambulatory Visit: Payer: Self-pay

## 2020-09-12 DIAGNOSIS — M159 Polyosteoarthritis, unspecified: Secondary | ICD-10-CM

## 2020-09-12 DIAGNOSIS — R0602 Shortness of breath: Secondary | ICD-10-CM

## 2020-10-01 ENCOUNTER — Other Ambulatory Visit: Payer: Self-pay

## 2020-10-01 ENCOUNTER — Ambulatory Visit: Payer: BC Managed Care – PPO | Admitting: Physician Assistant

## 2020-10-01 ENCOUNTER — Encounter: Payer: Self-pay | Admitting: Physician Assistant

## 2020-10-01 DIAGNOSIS — E669 Obesity, unspecified: Secondary | ICD-10-CM

## 2020-10-01 DIAGNOSIS — I700 Atherosclerosis of aorta: Secondary | ICD-10-CM | POA: Diagnosis not present

## 2020-10-01 DIAGNOSIS — I10 Essential (primary) hypertension: Secondary | ICD-10-CM | POA: Diagnosis not present

## 2020-10-01 DIAGNOSIS — J4520 Mild intermittent asthma, uncomplicated: Secondary | ICD-10-CM

## 2020-10-01 DIAGNOSIS — E1165 Type 2 diabetes mellitus with hyperglycemia: Secondary | ICD-10-CM

## 2020-10-01 MED ORDER — PRAVASTATIN SODIUM 20 MG PO TABS: 20.0000 mg | ORAL_TABLET | Freq: Every day | ORAL | 1 refills | Status: DC

## 2020-10-01 MED ORDER — OZEMPIC (1 MG/DOSE) 2 MG/1.5ML ~~LOC~~ SOPN: 1.0000 mg | PEN_INJECTOR | SUBCUTANEOUS | 2 refills | Status: DC

## 2020-10-01 NOTE — Progress Notes (Signed)
[facility] [place], [place] [address]  Internal MEDICINE  Office Visit Note  Patient Name: Danielle Wallace  [id number]  [id number]  Date of Service: 10/03/2020  Chief Complaint  Patient presents with  . Follow-up  . Diabetes  . Arthritis  . Cancer    HPI Pt is here for routine follow up. -BG at home fasting 120-150. She is tolerating the ozempic and tresiba. Stopped the humalog and metformin completely. Metformin was stopped about a week ago because of GI upset. Felt like she had sugar lows on the humalog. -Breathing well and using breo daily. -Has lost 20 pounds since last visit  Current Medication: Outpatient Encounter Medications as of 10/01/2020  Medication Sig  . Semaglutide, 1 MG/DOSE, (OZEMPIC, 1 MG/DOSE,) 2 MG/1.5ML SOPN Inject 1 mg into the skin once a week.  Marland Kitchen albuterol (VENTOLIN HFA) 108 (90 Base) MCG/ACT inhaler TAKE 2 PUFFS BY MOUTH EVERY 6 HOURS AS NEEDED FOR WHEEZE OR SHORTNESS OF BREATH  . B COMPLEX-C PO Take by mouth daily.  . benzonatate (TESSALON) 100 MG capsule Take 1 capsule (100 mg total) by mouth 2 (two) times daily as needed for cough.  . Calcium Carb-Cholecalciferol (CALCIUM + D3) 600-200 MG-UNIT TABS Take 1 tablet by mouth 2 (two) times daily.  . Calcium Carbonate-Vitamin D 600-200 MG-UNIT TABS Calcium 600 + D(3) 600 mg (1,500 mg)-200 unit tablet  TAKE 1 TABLET BY MOUTH 2 (TWO) TIMES DAILY.  . diphenhydrAMINE (BENADRYL) 25 MG tablet Take 25 mg by mouth every 6 (six) hours as needed.  . doxycycline (VIBRAMYCIN) 100 MG capsule Take 1 capsule (100 mg total) by mouth 2 (two) times daily.  Marland Kitchen EPINEPHrine (EPIPEN 2-PAK) 0.3 mg/0.3 mL IJ SOAJ injection Inject 0.3 mLs (0.3 mg total) into the skin as needed.  . etodolac (LODINE) 400 MG tablet TAKE 1 TABLET BY MOUTH TWICE A DAY AS NEEDED FOR PAIN  . fluticasone (FLONASE) 50 MCG/ACT nasal spray Place into both nostrils daily.  . fluticasone furoate-vilanterol (BREO ELLIPTA) 100-25 MCG/INH  AEPB Inhale 1 puff into the lungs daily.  Marland Kitchen glucose blood (ONETOUCH VERIO) test strip Blood sugar testing done QD and as needed  E11.65  . imipramine (TOFRANIL) 25 MG tablet TAKE 2 TABLETS BY MOUTH AT BEDTIME.  . insulin degludec (TRESIBA FLEXTOUCH) 100 UNIT/ML FlexTouch Pen Inject into the skin daily. Take in the evening, raise dosage by 2 units every 3 days until blood sugar is 130 or below.  . insulin lispro (HUMALOG) 100 UNIT/ML KwikPen Inject 10 Units into the skin 2 (two) times daily before a meal.  . ipratropium-albuterol (DUONEB) 0.5-2.5 (3) MG/3ML SOLN USE 1 VIAL IN NEBULIZER EVERY 6 HOURS AS NEEDED  . LINZESS 145 MCG CAPS capsule TAKE 1 CAPSULE BY MOUTH DAILY BEFORE BREAKFAST.  . metFORMIN (GLUCOPHAGE-XR) 500 MG 24 hr tablet Take one tab po qd with supper  . nadolol (CORGARD) 80 MG tablet Take 1 tablet (80 mg total) by mouth daily.  Marland Kitchen NOVOTWIST 32G X 5 MM MISC Inject into the skin daily.  . Omega-3 Fatty Acids (FISH OIL OMEGA-3 PO) Take by mouth daily.  . pantoprazole (PROTONIX) 40 MG tablet TAKE 1 TABLET BY MOUTH EVERY DAY  . pravastatin (PRAVACHOL) 20 MG tablet Take 1 tablet (20 mg total) by mouth daily.  . predniSONE (DELTASONE) 5 MG tablet Take 1 tablet (5 mg total) by mouth daily with breakfast. Take 82m day1-2 then taper by 560mevery other day  . tamoxifen (NOLVADEX) 10 MG  tablet TAKE 1 TABLET BY MOUTH EVERY DAY  . valsartan-hydrochlorothiazide (DIOVAN-HCT) 80-12.5 MG tablet Take 1 tablet by mouth daily.  Marland Kitchen VITAMIN E PO Take by mouth daily.  . [DISCONTINUED] pravastatin (PRAVACHOL) 20 MG tablet Take 20 mg by mouth daily.  . [DISCONTINUED] Semaglutide,0.25 or 0.5MG/DOS, (OZEMPIC, 0.25 OR 0.5 MG/DOSE,) 2 MG/1.5ML SOPN Inject 0.5 mg into the skin once a week. Inject 0.5 mg once a week   No facility-administered encounter medications on file as of 10/01/2020.    Surgical History: Past Surgical History:  Procedure Laterality Date  . BREAST BIOPSY Right 2005   negative  . BREAST  BIOPSY Left 08/23/2007   negative  . BREAST BIOPSY Left 10/24/2013   positive  . BREAST BIOPSY Left 07/2002   neg  . BREAST BIOPSY Left 08/23/2007   neg  . BREAST SURGERY Left 10/2011   Cyst Aspirationapocrine metaplasia, ductal cells and bone cells, hypo-cellular  . BREAST SURGERY Left 2009   ADH on stereotactic biopsy, 1.5 mm focus.  Marland Kitchen BREAST SURGERY Left 2003   fibrocystic changes with ductal hyperplasia without atypia.  Marland Kitchen BREAST SURGERY Left    mastectomy  . BREAST SURGERY Left April 11, 2014   Removal of implant, debridement chest wall Dr.Coan  . CESAREAN SECTION  1991  . CHOLECYSTECTOMY  1990  . COLONOSCOPY  08/26/2013   Verdie Shire, M.D. normal.  . CRYOABLATION  2005, 2010  . CYST REMOVAL NECK  10/2011   Dr. Tami Ribas  . ESOPHAGOGASTRODUODENOSCOPY (EGD) WITH PROPOFOL N/A 01/16/2017   Procedure: ESOPHAGOGASTRODUODENOSCOPY (EGD) WITH PROPOFOL;  Surgeon: Lucilla Lame, MD;  Location: [facility];  Service: Gastroenterology;  Laterality: N/A;  Diabetic - oral meds  . INCISION AND DRAINAGE Left Dec 2015, Feb 2016   Dr Tula Nakayama  . MASTECTOMY Left 11/24/2013   positive  . PORTACATH PLACEMENT  11/24/13    Medical History: Past Medical History:  Diagnosis Date  . Arthritis 2006  . Asthma   . Breast cancer ([place]) 10/14/2013   18 mm, T1c, N0; ER/ PR positive,her 2 neu overexpressed. Adjuvant chemo/ herceptin.  . Cancer [facility]) 2006   Renal cell carcinoma; cryosurgery treatment, right side  . Cirrhosis ([place])   . Collapsed lung 2007  . Diabetes mellitus without complication ([place]) 1030   Metformin  . Diffuse cystic mastopathy   . Family history of breast cancer   . Family history of liver cancer   . Motion sickness    any moving vehicle  . Orthodontics    braces  . Personal history of chemotherapy   . Sinus problem     Family History: Family History  Problem Relation Age of Onset  . Liver cancer Father   . Hemochromatosis Father   . Liver disease Father   . Diabetes Mother    . Hypertension Mother   . Breast cancer Paternal Aunt 39  . Ovarian cancer Maternal Grandmother        unk primary, metastatic cancer  . Hemachromatosis Daughter   . Liver cancer Paternal Uncle   . Hemochromatosis Paternal Uncle     Social History   Socioeconomic History  . Marital status: Married    Spouse name: Not on file  . Number of children: Not on file  . Years of education: Not on file  . Highest education level: Not on file  Occupational History  . Not on file  Tobacco Use  . Smoking status: Never Smoker  . Smokeless tobacco: Never Used  Vaping Use  .  Vaping Use: Never used  Substance and Sexual Activity  . Alcohol use: Not Currently    Comment: occasionally, none recently  . Drug use: No  . Sexual activity: Not on file  Other Topics Concern  . Not on file  Social History Narrative  . Not on file   Social Determinants of Health   Financial Resource Strain: Not on file  Food Insecurity: Not on file  Transportation Needs: Not on file  Physical Activity: Not on file  Stress: Not on file  Social Connections: Not on file  Intimate Partner Violence: Not on file      Review of Systems  Constitutional: Negative for chills, fatigue and unexpected weight change.  HENT: Negative for congestion, postnasal drip, rhinorrhea, sneezing and sore throat.   Eyes: Negative for redness.  Respiratory: Negative for cough, chest tightness, shortness of breath and wheezing.   Cardiovascular: Negative for chest pain and palpitations.  Gastrointestinal: Negative for abdominal pain, constipation, diarrhea, nausea and vomiting.  Genitourinary: Negative for dysuria and frequency.  Musculoskeletal: Negative for arthralgias, back pain, joint swelling and neck pain.  Skin: Negative for rash.  Neurological: Negative.  Negative for tremors and numbness.  Hematological: Negative for adenopathy. Does not bruise/bleed easily.  Psychiatric/Behavioral: Negative for behavioral problems  (Depression), sleep disturbance and suicidal ideas. The patient is not nervous/anxious.     Vital Signs: BP 132/74   Pulse 71   Temp 98 F (36.7 C)   Resp 16   Ht 5' 6"  (1.676 m)   Wt 216 lb (98 kg)   SpO2 97%   BMI 34.86 kg/m    Physical Exam Vitals and nursing note reviewed.  Constitutional:      General: She is not in acute distress.    Appearance: She is well-developed. She is obese. She is not diaphoretic.  HENT:     Head: Normocephalic and atraumatic.     Mouth/Throat:     Pharynx: No oropharyngeal exudate.  Eyes:     Pupils: Pupils are equal, round, and reactive to light.  Neck:     Thyroid: No thyromegaly.     Vascular: No JVD.     Trachea: No tracheal deviation.  Cardiovascular:     Rate and Rhythm: Normal rate and regular rhythm.     Heart sounds: Normal heart sounds. No murmur heard. No friction rub. No gallop.   Pulmonary:     Effort: Pulmonary effort is normal. No respiratory distress.     Breath sounds: No wheezing or rales.  Chest:     Chest wall: No tenderness.  Abdominal:     General: Bowel sounds are normal.     Palpations: Abdomen is soft.  Musculoskeletal:        General: Normal range of motion.     Cervical back: Normal range of motion and neck supple.  Lymphadenopathy:     Cervical: No cervical adenopathy.  Skin:    General: Skin is warm and dry.  Neurological:     Mental Status: She is alert and oriented to person, place, and time.     Cranial Nerves: No cranial nerve deficit.  Psychiatric:        Behavior: Behavior normal.        Thought Content: Thought content normal.        Judgment: Judgment normal.        Assessment/Plan: 1. Uncontrolled type 2 diabetes mellitus with hyperglycemia (HCC) - POCT HgB A1C is significantly improved today, continue tresiba and will  increase ozempic for further BG control and wt loss. Continue to monitor BG at home - Semaglutide, 1 MG/DOSE, (OZEMPIC, 1 MG/DOSE,) 2 MG/1.5ML SOPN; Inject 1 mg into  the skin once a week.  Dispense: 6 mL; Refill: 2  2. Essential hypertension Well controlled, continue current therapy  3. Aortic atherosclerosis (HCC) Continue pravastatin, refill sent - pravastatin (PRAVACHOL) 20 MG tablet; Take 1 tablet (20 mg total) by mouth daily.  Dispense: 90 tablet; Refill: 1  4. Chronic asthma, mild intermittent, uncomplicated Continue inhalers as prescribed  5. Obesity (BMI 30.0-34.9) Has lost 20lbs since last visit, will increase ozempic for further wt loss Obesity Counseling: Had a lengthy discussion regarding patients BMI and weight issues. Patient was instructed on portion control as well as increased activity. Also discussed caloric restrictions with trying to maintain intake less than 2000 Kcal. Discussions were made in accordance with the 5As of weight management. Simple actions such as not eating late and if able to, taking a walk is suggested.    General Counseling: muskaan smet understanding of the findings of todays visit and agrees with plan of treatment. I have discussed any further diagnostic evaluation that may be needed or ordered today. We also reviewed her medications today. she has been encouraged to call the office with any questions or concerns that should arise related to todays visit.    Orders Placed This Encounter  Procedures  . POCT HgB A1C    Meds ordered this encounter  Medications  . pravastatin (PRAVACHOL) 20 MG tablet    Sig: Take 1 tablet (20 mg total) by mouth daily.    Dispense:  90 tablet    Refill:  1  . Semaglutide, 1 MG/DOSE, (OZEMPIC, 1 MG/DOSE,) 2 MG/1.5ML SOPN    Sig: Inject 1 mg into the skin once a week.    Dispense:  6 mL    Refill:  2    This patient was seen by Drema Dallas, PA-C in collaboration with Dr. Clayborn Bigness as a part of collaborative care agreement.   Total time spent:30 Minutes Time spent includes review of chart, medications, test results, and follow up plan with the patient.       Dr Lavera Guise Internal medicine

## 2020-10-09 LAB — POCT GLYCOSYLATED HEMOGLOBIN (HGB A1C): Hemoglobin A1C: 6.8 % — AB (ref 4.0–5.6)

## 2020-10-10 ENCOUNTER — Other Ambulatory Visit: Payer: Self-pay | Admitting: Nurse Practitioner

## 2020-10-11 ENCOUNTER — Other Ambulatory Visit: Payer: Self-pay | Admitting: Internal Medicine

## 2020-10-17 ENCOUNTER — Ambulatory Visit: Payer: BC Managed Care – PPO | Admitting: Gastroenterology

## 2020-10-17 ENCOUNTER — Encounter: Payer: Self-pay | Admitting: Gastroenterology

## 2020-10-17 ENCOUNTER — Other Ambulatory Visit: Payer: Self-pay

## 2020-10-17 VITALS — BP 103/71 | HR 83 | Temp 96.8°F | Ht 66.0 in | Wt 211.4 lb

## 2020-10-17 DIAGNOSIS — K7460 Unspecified cirrhosis of liver: Secondary | ICD-10-CM

## 2020-10-17 MED ORDER — NADOLOL 80 MG PO TABS: 80.0000 mg | ORAL_TABLET | Freq: Every day | ORAL | 3 refills | Status: DC

## 2020-10-17 NOTE — Progress Notes (Signed)
Primary Care Physician: Lavera Guise, MD  Primary Gastroenterologist:  Dr. Lucilla Lame  Chief Complaint  Patient presents with   Medication Refill    HPI: Danielle Wallace is a 60 y.o. female here with a history of cirrhosis and a need for her nadolol to be refilled.  The patient reports that she has a lot of gas and bloating and states that the passing of gas wakes her up in the melanite.  She notices the gas to be worse when she is laying down at night.  The patient also reports that she eats cheese but does not have a lot of other milk products.  The patient has a history of fatty liver with cirrhosis and portal hypertension.  The patient has not had any report of rectal bleeding.  She had an MRI due to some concerns about her past history of a renal mass.  That was in August of last year.   Past Medical History:  Diagnosis Date   Arthritis 2006   Asthma    Breast cancer ([place]) 10/14/2013   18 mm, T1c, N0; ER/ PR positive,her 2 neu overexpressed. Adjuvant chemo/ herceptin.   Cancer [facility]) 2006   Renal cell carcinoma; cryosurgery treatment, right side   Cirrhosis ([place])    Collapsed lung 2007   Diabetes mellitus without complication ([place]) 7619   Metformin   Diffuse cystic mastopathy    Family history of breast cancer    Family history of liver cancer    Motion sickness    any moving vehicle   Orthodontics    braces   Personal history of chemotherapy    Sinus problem     Current Outpatient Medications  Medication Sig Dispense Refill   albuterol (VENTOLIN HFA) 108 (90 Base) MCG/ACT inhaler TAKE 2 PUFFS BY MOUTH EVERY 6 HOURS AS NEEDED FOR WHEEZE OR SHORTNESS OF BREATH 6.7 each 1   B COMPLEX-C PO Take by mouth daily.     Calcium Carb-Cholecalciferol (CALCIUM + D3) 600-200 MG-UNIT TABS Take 1 tablet by mouth 2 (two) times daily. 180 tablet 3   Calcium Carbonate-Vitamin D 600-200 MG-UNIT TABS Calcium 600 + D(3) 600 mg (1,500 mg)-200 unit tablet  TAKE 1 TABLET BY MOUTH 2 (TWO)  TIMES DAILY.     diphenhydrAMINE (BENADRYL) 25 MG tablet Take 25 mg by mouth every 6 (six) hours as needed.     EPINEPHrine (EPIPEN 2-PAK) 0.3 mg/0.3 mL IJ SOAJ injection Inject 0.3 mLs (0.3 mg total) into the skin as needed. 1 each 1   etodolac (LODINE) 400 MG tablet TAKE 1 TABLET BY MOUTH TWICE A DAY AS NEEDED FOR PAIN 60 tablet 0   fluticasone (FLONASE) 50 MCG/ACT nasal spray Place into both nostrils daily.     fluticasone furoate-vilanterol (BREO ELLIPTA) 100-25 MCG/INH AEPB Inhale 1 puff into the lungs daily. 1 each 2   glucose blood (ONETOUCH VERIO) test strip Blood sugar testing done QD and as needed  E11.65 100 each 12   imipramine (TOFRANIL) 25 MG tablet TAKE 2 TABLETS BY MOUTH AT BEDTIME. 180 tablet 1   insulin degludec (TRESIBA FLEXTOUCH) 100 UNIT/ML FlexTouch Pen Inject into the skin daily. Take in the evening, raise dosage by 2 units every 3 days until blood sugar is 130 or below.     ipratropium-albuterol (DUONEB) 0.5-2.5 (3) MG/3ML SOLN USE 1 VIAL IN NEBULIZER EVERY 6 HOURS AS NEEDED 360 mL 0   LINZESS 145 MCG CAPS capsule TAKE 1 CAPSULE BY MOUTH DAILY  BEFORE BREAKFAST. 30 capsule 3   NOVOTWIST 32G X 5 MM MISC Inject into the skin daily.     Omega-3 Fatty Acids (FISH OIL OMEGA-3 PO) Take by mouth daily.     pantoprazole (PROTONIX) 40 MG tablet TAKE 1 TABLET BY MOUTH EVERY DAY 90 tablet 3   pravastatin (PRAVACHOL) 20 MG tablet Take 1 tablet (20 mg total) by mouth daily. 90 tablet 1   Semaglutide, 1 MG/DOSE, (OZEMPIC, 1 MG/DOSE,) 2 MG/1.5ML SOPN Inject 1 mg into the skin once a week. 6 mL 2   tamoxifen (NOLVADEX) 10 MG tablet TAKE 1 TABLET BY MOUTH EVERY DAY 90 tablet 2   valsartan-hydrochlorothiazide (DIOVAN-HCT) 80-12.5 MG tablet TAKE 1 TABLET BY MOUTH EVERY DAY 90 tablet 2   VITAMIN E PO Take by mouth daily.     nadolol (CORGARD) 80 MG tablet Take 1 tablet (80 mg total) by mouth daily. 90 tablet 3   No current facility-administered medications for this visit.    Allergies as  of 10/17/2020 - Review Complete 10/17/2020  Allergen Reaction Noted   Exemestane Anaphylaxis 06/18/2015   Floxin [ofloxacin] Anaphylaxis 10/12/2013   Gluten meal Anaphylaxis 01/13/2017   Levofloxacin Anaphylaxis, Diarrhea, Itching, Nausea Only, Other (See Comments), Palpitations, Shortness Of Breath, Swelling, and Tinitus    Linezolid Anaphylaxis 09/25/2014   Sesame oil Anaphylaxis 11/02/2019   Taxotere [docetaxel] Anaphylaxis 09/25/2014   Hydrocodone Itching 10/12/2013   Invokana [canagliflozin]  10/06/2019   Metformin and related  10/06/2019   Sulfa antibiotics Other (See Comments) 12/28/2012   Codeine Rash 08/09/2014   Penicillins Rash 10/12/2013   Vancomycin Rash 08/16/2014    ROS:  General: Negative for anorexia, weight loss, fever, chills, fatigue, weakness. ENT: Negative for hoarseness, difficulty swallowing , nasal congestion. CV: Negative for chest pain, angina, palpitations, dyspnea on exertion, peripheral edema.  Respiratory: Negative for dyspnea at rest, dyspnea on exertion, cough, sputum, wheezing.  GI: See history of present illness. GU:  Negative for dysuria, hematuria, urinary incontinence, urinary frequency, nocturnal urination.  Endo: Negative for unusual weight change.    Physical Examination:   BP 103/71   Pulse 83   Temp (!) 96.8 F (36 C) (Temporal)   Ht 5' 6"  (1.676 m)   Wt 211 lb 6.4 oz (95.9 kg)   BMI 34.12 kg/m   General: Well-nourished, well-developed in no acute distress.  Eyes: No icterus. Conjunctivae pink. Lungs: Clear to auscultation bilaterally. Non-labored. Heart: Regular rate and rhythm, no murmurs rubs or gallops.  Abdomen: Bowel sounds are normal, nontender, nondistended, no hepatosplenomegaly or masses, no abdominal bruits or hernia , no rebound or guarding.   Extremities: No lower extremity edema. No clubbing or deformities. Neuro: Alert and oriented x 3.  Grossly intact. Skin: Warm and dry, no jaundice.   Psych: Alert and  cooperative, normal mood and affect.  Labs:    Imaging Studies: No results found.  Assessment and Plan:   Danielle Wallace is a 60 y.o. y/o female who comes in today for follow-up of her portal hypertension with cirrhosis.  The patient needs a refill of her nadolol and that will be done.  The patient will also try to see what foods are making her more gassy and has been told to try and avoid swallowing excessive amounts of air by chewing gum, drink with a straw, eating quickly or drinking carbonated drinks.  She has also been told to increase fiber in her diet which may facilitate her bowel movements and expelling gas.  Patient  has been explained the plan and agrees with it.     Lucilla Lame, MD. Marval Regal    Note: This dictation was prepared with Dragon dictation along with smaller phrase technology. Any transcriptional errors that result from this process are unintentional.

## 2020-10-18 ENCOUNTER — Other Ambulatory Visit: Payer: Self-pay | Admitting: Internal Medicine

## 2020-10-18 DIAGNOSIS — M159 Polyosteoarthritis, unspecified: Secondary | ICD-10-CM

## 2020-10-23 ENCOUNTER — Ambulatory Visit: Payer: BC Managed Care – PPO | Admitting: Internal Medicine

## 2020-11-06 ENCOUNTER — Ambulatory Visit (INDEPENDENT_AMBULATORY_CARE_PROVIDER_SITE_OTHER): Payer: BC Managed Care – PPO | Admitting: Internal Medicine

## 2020-11-06 ENCOUNTER — Other Ambulatory Visit: Payer: Self-pay

## 2020-11-06 ENCOUNTER — Encounter: Payer: Self-pay | Admitting: Internal Medicine

## 2020-11-06 VITALS — BP 118/64 | HR 82 | Temp 97.2°F | Resp 16 | Ht 66.0 in | Wt 211.0 lb

## 2020-11-06 DIAGNOSIS — U099 Post covid-19 condition, unspecified: Secondary | ICD-10-CM

## 2020-11-06 DIAGNOSIS — E669 Obesity, unspecified: Secondary | ICD-10-CM | POA: Diagnosis not present

## 2020-11-06 DIAGNOSIS — J4520 Mild intermittent asthma, uncomplicated: Secondary | ICD-10-CM

## 2020-11-06 DIAGNOSIS — R053 Chronic cough: Secondary | ICD-10-CM | POA: Diagnosis not present

## 2020-11-06 NOTE — Progress Notes (Signed)
[facility] [place], [place] [address]  Pulmonary Sleep Medicine   Office Visit Note  Patient Name: Danielle Wallace DOB: [birth year] MRN [id number]  Date of Service: 11/06/2020  Complaints/HPI: post Covid. She had covid back in February 8. She had a long course. She had cough and congestion. She states that she was put on a nebulizer. She had a Ct scan done which was unremarkable. Patient states that she has had no wheeze currently. Patient states that she has gone out for a walk and had difficulty with SOB. She had a spiro done which does show improvement over time. She is using breo regularly  ROS  General: (-) fever, (-) chills, (-) night sweats, (-) weakness Skin: (-) rashes, (-) itching,. Eyes: (-) visual changes, (-) redness, (-) itching. Nose and Sinuses: (-) nasal stuffiness or itchiness, (-) postnasal drip, (-) nosebleeds, (-) sinus trouble. Mouth and Throat: (-) sore throat, (-) hoarseness. Neck: (-) swollen glands, (-) enlarged thyroid, (-) neck pain. Respiratory: + cough, (-) bloody sputum, + shortness of breath, - wheezing. Cardiovascular: - ankle swelling, (-) chest pain. Lymphatic: (-) lymph node enlargement. Neurologic: (-) numbness, (-) tingling. Psychiatric: (-) anxiety, (-) depression   Current Medication: Outpatient Encounter Medications as of 11/06/2020  Medication Sig   albuterol (VENTOLIN HFA) 108 (90 Base) MCG/ACT inhaler TAKE 2 PUFFS BY MOUTH EVERY 6 HOURS AS NEEDED FOR WHEEZE OR SHORTNESS OF BREATH   B COMPLEX-C PO Take by mouth daily.   Calcium Carb-Cholecalciferol (CALCIUM + D3) 600-200 MG-UNIT TABS Take 1 tablet by mouth 2 (two) times daily.   Calcium Carbonate-Vitamin D 600-200 MG-UNIT TABS Calcium 600 + D(3) 600 mg (1,500 mg)-200 unit tablet  TAKE 1 TABLET BY MOUTH 2 (TWO) TIMES DAILY.   diphenhydrAMINE (BENADRYL) 25 MG tablet Take 25 mg by mouth every 6 (six) hours as needed.   EPINEPHrine (EPIPEN 2-PAK) 0.3 mg/0.3 mL IJ  SOAJ injection Inject 0.3 mLs (0.3 mg total) into the skin as needed.   etodolac (LODINE) 400 MG tablet TAKE 1 TABLET BY MOUTH TWICE A DAY AS NEEDED FOR PAIN   fluticasone (FLONASE) 50 MCG/ACT nasal spray Place into both nostrils daily.   fluticasone furoate-vilanterol (BREO ELLIPTA) 100-25 MCG/INH AEPB Inhale 1 puff into the lungs daily.   glucose blood (ONETOUCH VERIO) test strip Blood sugar testing done QD and as needed  E11.65   imipramine (TOFRANIL) 25 MG tablet TAKE 2 TABLETS BY MOUTH AT BEDTIME.   insulin degludec (TRESIBA FLEXTOUCH) 100 UNIT/ML FlexTouch Pen Inject 40 Units into the skin daily. Take in the evening, raise dosage by 2 units every 3 days until blood sugar is 130 or below.   ipratropium-albuterol (DUONEB) 0.5-2.5 (3) MG/3ML SOLN USE 1 VIAL IN NEBULIZER EVERY 6 HOURS AS NEEDED   LINZESS 145 MCG CAPS capsule TAKE 1 CAPSULE BY MOUTH DAILY BEFORE BREAKFAST.   nadolol (CORGARD) 80 MG tablet Take 1 tablet (80 mg total) by mouth daily.   Omega-3 Fatty Acids (FISH OIL OMEGA-3 PO) Take by mouth daily.   pantoprazole (PROTONIX) 40 MG tablet TAKE 1 TABLET BY MOUTH EVERY DAY   pravastatin (PRAVACHOL) 20 MG tablet Take 1 tablet (20 mg total) by mouth daily.   Semaglutide, 1 MG/DOSE, (OZEMPIC, 1 MG/DOSE,) 2 MG/1.5ML SOPN Inject 1 mg into the skin once a week.   tamoxifen (NOLVADEX) 10 MG tablet TAKE 1 TABLET BY MOUTH EVERY DAY   valsartan-hydrochlorothiazide (DIOVAN-HCT) 80-12.5 MG tablet TAKE 1 TABLET BY MOUTH EVERY DAY  VITAMIN E PO Take by mouth daily.   [DISCONTINUED] NOVOTWIST 32G X 5 MM MISC Inject into the skin daily. (Patient not taking: Reported on 11/06/2020)   No facility-administered encounter medications on file as of 11/06/2020.    Surgical History: Past Surgical History:  Procedure Laterality Date   BREAST BIOPSY Right 2005   negative   BREAST BIOPSY Left 08/23/2007   negative   BREAST BIOPSY Left 10/24/2013   positive   BREAST BIOPSY Left 07/2002   neg   BREAST  BIOPSY Left 08/23/2007   neg   BREAST SURGERY Left 10/2011   Cyst Aspirationapocrine metaplasia, ductal cells and bone cells, hypo-cellular   BREAST SURGERY Left 2009   ADH on stereotactic biopsy, 1.5 mm focus.   BREAST SURGERY Left 2003   fibrocystic changes with ductal hyperplasia without atypia.   BREAST SURGERY Left    mastectomy   BREAST SURGERY Left April 11, 2014   Removal of implant, debridement chest wall [place]   COLONOSCOPY  08/26/2013   Verdie Shire, M.D. normal.   CRYOABLATION  2005, 2010   CYST REMOVAL NECK  10/2011   Dr. Tami Ribas   ESOPHAGOGASTRODUODENOSCOPY (EGD) WITH PROPOFOL N/A 01/16/2017   Procedure: ESOPHAGOGASTRODUODENOSCOPY (EGD) WITH PROPOFOL;  Surgeon: Lucilla Lame, MD;  Location: [place];  Service: Gastroenterology;  Laterality: N/A;  Diabetic - oral meds   INCISION AND DRAINAGE Left Dec 2015, Feb 2016   Dr Tula Nakayama   MASTECTOMY Left 11/24/2013   positive   PORTACATH PLACEMENT  11/24/13    Medical History: Past Medical History:  Diagnosis Date   Arthritis 2006   Asthma    Breast cancer ([place]) 10/14/2013   18 mm, T1c, N0; ER/ PR positive,her 2 neu overexpressed. Adjuvant chemo/ herceptin.   Cancer [facility]) 2006   Renal cell carcinoma; cryosurgery treatment, right side   Cirrhosis ([place])    Collapsed lung 2007   Diabetes mellitus without complication ([place]) 2130   Metformin   Diffuse cystic mastopathy    Family history of breast cancer    Family history of liver cancer    Motion sickness    any moving vehicle   Orthodontics    braces   Personal history of chemotherapy    Sinus problem     Family History: Family History  Problem Relation Age of Onset   Liver cancer Father    Hemochromatosis Father    Liver disease Father    Diabetes Mother    Hypertension Mother    Breast cancer Paternal Aunt 61   Ovarian cancer Maternal Grandmother        unk primary, metastatic cancer   Hemachromatosis  Daughter    Liver cancer Paternal Uncle    Hemochromatosis Paternal Uncle     Social History: Social History   Socioeconomic History   Marital status: Married    Spouse name: Not on file   Number of children: Not on file   Years of education: Not on file   Highest education level: Not on file  Occupational History   Not on file  Tobacco Use   Smoking status: Never   Smokeless tobacco: Never  Vaping Use   Vaping Use: Never used  Substance and Sexual Activity   Alcohol use: Not Currently    Comment: occasionally, none recently   Drug use: No   Sexual activity: Not on file  Other Topics Concern   Not on file  Social  History Narrative   Not on file   Social Determinants of Health   Financial Resource Strain: Not on file  Food Insecurity: Not on file  Transportation Needs: Not on file  Physical Activity: Not on file  Stress: Not on file  Social Connections: Not on file  Intimate Partner Violence: Not on file    Vital Signs: Blood pressure 118/64, pulse 82, temperature (!) 97.2 F (36.2 C), resp. rate 16, height 5' 6"  (1.676 m), weight 211 lb (95.7 kg), SpO2 97 %.  Examination: General Appearance: The patient is well-developed, well-nourished, and in no distress. Skin: Gross inspection of skin unremarkable. Head: normocephalic, no gross deformities. Eyes: no gross deformities noted. ENT: ears appear grossly normal no exudates. Neck: Supple. No thyromegaly. No LAD. Respiratory: No rhonchi. Cardiovascular: Normal S1 and S2 without murmur or rub. Extremities: No cyanosis. pulses are equal. Neurologic: Alert and oriented. No involuntary movements.  LABS: Recent Results (from the past 2160 hour(s))  POCT HgB A1C     Status: Abnormal   Collection Time: 10/09/20  9:21 AM  Result Value Ref Range   Hemoglobin A1C 6.8 (A) 4.0 - 5.6 %   HbA1c POC (<> result, manual entry)     HbA1c, POC (prediabetic range)     HbA1c, POC (controlled diabetic range)       Radiology: CT Chest Wo Contrast  Result Date: 08/27/2020 CLINICAL DATA:  Persistent cough and increasing shortness of breath, history of COVID 30 June 2020, history of left breast cancer status post mastectomy EXAM: CT CHEST WITHOUT CONTRAST TECHNIQUE: Multidetector CT imaging of the chest was performed following the standard protocol without IV contrast. COMPARISON:  07/02/2020, 10/27/2016 FINDINGS: Cardiovascular: Unenhanced imaging of the heart and great vessels demonstrate no pericardial effusion. Normal caliber of the thoracic aorta, with mild atherosclerosis of the descending thoracic aorta. Mild atherosclerosis of the coronary vasculature greatest in the LAD distribution. Mediastinum/Nodes: No enlarged mediastinal or axillary lymph nodes. Thyroid gland, trachea, and esophagus demonstrate no significant findings. Lungs/Pleura: No airspace disease, effusion, or pneumothorax. No evidence of postinflammatory scarring. Central airways are patent. Upper Abdomen: No acute abnormality. Musculoskeletal: Postsurgical changes from left mastectomy. There are no acute or destructive bony lesions. Reconstructed images demonstrate no additional findings. IMPRESSION: 1. No acute intrathoracic process. 2. Aortic Atherosclerosis (ICD10-I70.0). Coronary artery atherosclerosis. Electronically Signed   By: Randa Ngo M.D.   On: 08/27/2020 13:53    No results found.  No results found.    Assessment and Plan: Patient Active Problem List   Diagnosis Date Noted   Complex tear of medial meniscus of right knee as current injury 03/04/2020   Aortic atherosclerosis ([place]) 03/04/2020   Osteoarthritis of knee 03/02/2020   Encounter for general adult medical examination with abnormal findings 01/31/2020   Primary osteoarthritis of both knees 01/31/2020   Degenerative disc disease, lumbar 01/31/2020   Genetic testing 11/16/2019   Family history of breast cancer    Family history of liver cancer     Multinodular goiter 10/10/2019   Thyromegaly 09/19/2019   Allergic reaction 07/01/2019   Breast cancer of upper-outer quadrant of left female breast ([place]) 10/27/2018   Routine cervical smear 10/19/2018   Uncontrolled type 2 diabetes mellitus with hyperglycemia ([place]) 10/19/2018   Gastroesophageal reflux disease with esophagitis 10/19/2018   Stomatitis and mucositis 10/19/2018   Urinary tract infection without hematuria 10/19/2018   Dysuria 10/19/2018   Need for vaccination against Streptococcus pneumoniae using pneumococcal conjugate vaccine 13 04/21/2018   Acute bronchitis 10/01/2017  Wheezing 10/01/2017   Sore throat 10/01/2017   Fatigue 09/23/2017   Vitamin D deficiency 09/23/2017   Anemia 09/23/2017   Acute non-recurrent pansinusitis 08/12/2017   Type 2 diabetes mellitus with hyperglycemia, without long-term current use of insulin ([place]) 08/12/2017   Epstein Barr virus infection 08/12/2017   Essential hypertension 05/18/2017   Abnormal CT scan, stomach    Gastric varices    Cellulitis of female breast 12/11/2016   Open wound of breast 12/11/2016   Abdominal distention 01/23/2016   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 Chronic asthma, mild intermittent, uncomplicated Likely had underlying RADs now with post covid lingering symptoms. Arlyce Harman improving. Would follow along. Continue with inhalers as  prescribed  2. Obesity (BMI 30.0-34.9) Obesity Counseling: Risk Assessment: An assessment of behavioral risk factors was made today and includes lack of exercise sedentary lifestyle, lack of portion control and poor dietary habits.  Risk Modification Advice: She was counseled on portion control guidelines. Restricting daily caloric intake to 1500. The detrimental long term effects of obesity on her health and ongoing poor compliance was also discussed with the patient.    3. Post-COVID chronic cough Improving though slowlyu. Ct scan was OK and personally reviewed it. PFt also reviewed   General Counseling: I have discussed the findings of the evaluation and examination with Selinda Eon.  I have also discussed any further diagnostic evaluation thatmay be needed or ordered today. Ares verbalizes understanding of the findings of todays visit. We also reviewed her medications today and discussed drug interactions and side effects including but not limited excessive drowsiness and altered mental states. We also discussed that there is always a risk not just to her but also people around her. she has been encouraged to call the office with any questions or concerns that should arise related to todays visit.  No orders of the defined types were placed in this encounter.    Time spent: 66  I have personally obtained a history, examined the patient, evaluated laboratory and imaging results, formulated the assessment and plan and placed orders.    Allyne Gee, MD [facility] Pulmonary and Critical Care Sleep medicine

## 2020-11-19 ENCOUNTER — Telehealth: Payer: Self-pay | Admitting: Internal Medicine

## 2020-11-19 NOTE — Telephone Encounter (Signed)
Patient requested to reschedule her 9/2 appointment due to being out of town.  Rescheduled and patient confirmed.

## 2020-11-20 ENCOUNTER — Other Ambulatory Visit: Payer: Self-pay | Admitting: Internal Medicine

## 2020-11-20 ENCOUNTER — Other Ambulatory Visit: Payer: Self-pay | Admitting: Nurse Practitioner

## 2020-11-20 DIAGNOSIS — E1165 Type 2 diabetes mellitus with hyperglycemia: Secondary | ICD-10-CM

## 2020-11-20 NOTE — Telephone Encounter (Signed)
Can u find out if she is taking her metformin pls

## 2020-11-21 ENCOUNTER — Other Ambulatory Visit: Payer: Self-pay

## 2020-11-21 ENCOUNTER — Ambulatory Visit
Admission: RE | Admit: 2020-11-21 | Discharge: 2020-11-21 | Disposition: A | Discharge status: Home / Self Care | Payer: BC Managed Care – PPO | Source: Ambulatory Visit | Attending: Gastroenterology | Admitting: Gastroenterology

## 2020-11-21 DIAGNOSIS — K7460 Unspecified cirrhosis of liver: Secondary | ICD-10-CM | POA: Insufficient documentation

## 2020-12-03 ENCOUNTER — Telehealth: Payer: Self-pay

## 2020-12-03 NOTE — Telephone Encounter (Signed)
Pt notified of [place] results through [place].

## 2020-12-03 NOTE — Telephone Encounter (Signed)
-----   Message from Lucilla Lame, MD sent at 12/02/2020 10:24 AM EDT ----- Let the patient know that the ultrasound showed cirrhosis but no sign of any masses or abnormal growths.

## 2020-12-16 ENCOUNTER — Other Ambulatory Visit: Payer: Self-pay | Admitting: Nurse Practitioner

## 2020-12-16 DIAGNOSIS — E1165 Type 2 diabetes mellitus with hyperglycemia: Secondary | ICD-10-CM

## 2020-12-24 ENCOUNTER — Other Ambulatory Visit: Payer: Self-pay | Admitting: General Surgery

## 2020-12-24 ENCOUNTER — Other Ambulatory Visit: Payer: Self-pay | Admitting: Internal Medicine

## 2020-12-24 DIAGNOSIS — Z1231 Encounter for screening mammogram for malignant neoplasm of breast: Secondary | ICD-10-CM

## 2020-12-24 DIAGNOSIS — M159 Polyosteoarthritis, unspecified: Secondary | ICD-10-CM

## 2020-12-26 ENCOUNTER — Other Ambulatory Visit: Payer: Self-pay

## 2020-12-26 MED ORDER — LINACLOTIDE 145 MCG PO CAPS: ORAL_CAPSULE | ORAL | 3 refills | Status: DC

## 2020-12-27 ENCOUNTER — Encounter: Payer: Self-pay | Admitting: Internal Medicine

## 2020-12-27 ENCOUNTER — Other Ambulatory Visit: Payer: Self-pay | Admitting: Internal Medicine

## 2020-12-27 DIAGNOSIS — E1165 Type 2 diabetes mellitus with hyperglycemia: Secondary | ICD-10-CM

## 2020-12-27 MED ORDER — ONETOUCH VERIO VI STRP: ORAL_STRIP | 12 refills | Status: DC

## 2020-12-27 MED ORDER — LINACLOTIDE 290 MCG PO CAPS: ORAL_CAPSULE | ORAL | 1 refills | Status: DC

## 2020-12-27 MED ORDER — FLUTICASONE FUROATE-VILANTEROL 100-25 MCG/INH IN AEPB: 1.0000 | INHALATION_SPRAY | Freq: Every day | RESPIRATORY_TRACT | 11 refills | Status: DC

## 2020-12-27 NOTE — Progress Notes (Signed)
Cardiac eval, pls discuss with dfk

## 2020-12-31 ENCOUNTER — Ambulatory Visit: Payer: BC Managed Care – PPO | Admitting: Physician Assistant

## 2021-01-11 ENCOUNTER — Other Ambulatory Visit: Payer: BC Managed Care – PPO

## 2021-01-11 ENCOUNTER — Ambulatory Visit: Payer: BC Managed Care – PPO | Admitting: Internal Medicine

## 2021-01-15 ENCOUNTER — Encounter: Payer: BC Managed Care – PPO | Admitting: Nurse Practitioner

## 2021-01-24 ENCOUNTER — Encounter: Payer: Self-pay | Admitting: Physician Assistant

## 2021-01-24 ENCOUNTER — Ambulatory Visit (INDEPENDENT_AMBULATORY_CARE_PROVIDER_SITE_OTHER): Payer: BC Managed Care – PPO | Admitting: Physician Assistant

## 2021-01-24 ENCOUNTER — Other Ambulatory Visit: Payer: Self-pay

## 2021-01-24 DIAGNOSIS — Z23 Encounter for immunization: Secondary | ICD-10-CM | POA: Diagnosis not present

## 2021-01-24 DIAGNOSIS — I2510 Atherosclerotic heart disease of native coronary artery without angina pectoris: Secondary | ICD-10-CM | POA: Diagnosis not present

## 2021-01-24 DIAGNOSIS — I10 Essential (primary) hypertension: Secondary | ICD-10-CM | POA: Diagnosis not present

## 2021-01-24 DIAGNOSIS — I700 Atherosclerosis of aorta: Secondary | ICD-10-CM | POA: Diagnosis not present

## 2021-01-24 DIAGNOSIS — Z794 Long term (current) use of insulin: Secondary | ICD-10-CM | POA: Diagnosis not present

## 2021-01-24 DIAGNOSIS — E1165 Type 2 diabetes mellitus with hyperglycemia: Secondary | ICD-10-CM

## 2021-01-24 LAB — POCT GLYCOSYLATED HEMOGLOBIN (HGB A1C): Hemoglobin A1C: 6.3 % — AB (ref 4.0–5.6)

## 2021-01-24 NOTE — Progress Notes (Signed)
[facility] [place], [place] [address]  Internal MEDICINE  Office Visit Note  Patient Name: Danielle Wallace  [id number]  [id number]  Date of Service: 01/26/2021  Chief Complaint  Patient presents with   Follow-up   Diabetes   Asthma    HPI Pt is here for routine follow up -BG meter stopped registering a few weeks ago, then started back this week a little higher 150. Prior to this was around 107-110.  -Taking 40units tresiba between 6:30-7:30 and ozempic weekly. A1c improving -Bp at home 107/60-but more often 120s -aortic atheroscerosis and coronary artery atherosclerosis seen on last CT chest. Does note some swelling in LE on vacation recently and had gained some weight but this has resolved once back to normal routine and diet. Discussed cardiac workup and patient interested in cardiology referral for further workup on these findings. She is on a statin -Denies any CP or SOB  Current Medication: Outpatient Encounter Medications as of 01/24/2021  Medication Sig   albuterol (VENTOLIN HFA) 108 (90 Base) MCG/ACT inhaler TAKE 2 PUFFS BY MOUTH EVERY 6 HOURS AS NEEDED FOR WHEEZE OR SHORTNESS OF BREATH   B COMPLEX-C PO Take by mouth daily.   Calcium Carb-Cholecalciferol (CALCIUM + D3) 600-200 MG-UNIT TABS Take 1 tablet by mouth 2 (two) times daily.   Calcium Carbonate-Vitamin D 600-200 MG-UNIT TABS Calcium 600 + D(3) 600 mg (1,500 mg)-200 unit tablet  TAKE 1 TABLET BY MOUTH 2 (TWO) TIMES DAILY.   diphenhydrAMINE (BENADRYL) 25 MG tablet Take 25 mg by mouth every 6 (six) hours as needed.   EPINEPHrine (EPIPEN 2-PAK) 0.3 mg/0.3 mL IJ SOAJ injection Inject 0.3 mLs (0.3 mg total) into the skin as needed.   etodolac (LODINE) 400 MG tablet TAKE 1 TABLET BY MOUTH TWICE A DAY AS NEEDED FOR PAIN   fluticasone (FLONASE) 50 MCG/ACT nasal spray Place into both nostrils daily.   fluticasone furoate-vilanterol (BREO ELLIPTA) 100-25 MCG/INH AEPB Inhale 1 puff into the lungs  daily.   glucose blood (ONETOUCH VERIO) test strip Blood sugar testing done QD and as needed  E11.65   imipramine (TOFRANIL) 25 MG tablet TAKE 2 TABLETS BY MOUTH AT BEDTIME.   insulin degludec (TRESIBA FLEXTOUCH) 100 UNIT/ML FlexTouch Pen Inject 40 Units into the skin daily. Take in the evening, raise dosage by 2 units every 3 days until blood sugar is 130 or below.   ipratropium-albuterol (DUONEB) 0.5-2.5 (3) MG/3ML SOLN USE 1 VIAL IN NEBULIZER EVERY 6 HOURS AS NEEDED   linaclotide (LINZESS) 290 MCG CAPS capsule TAKE 1 CAPSULE BY MOUTH DAILY BEFORE BREAKFAST.   nadolol (CORGARD) 80 MG tablet Take 1 tablet (80 mg total) by mouth daily.   Omega-3 Fatty Acids (FISH OIL OMEGA-3 PO) Take by mouth daily.   pantoprazole (PROTONIX) 40 MG tablet TAKE 1 TABLET BY MOUTH EVERY DAY   pravastatin (PRAVACHOL) 20 MG tablet Take 1 tablet (20 mg total) by mouth daily.   Semaglutide, 1 MG/DOSE, (OZEMPIC, 1 MG/DOSE,) 2 MG/1.5ML SOPN Inject 1 mg into the skin once a week.   tamoxifen (NOLVADEX) 10 MG tablet TAKE 1 TABLET BY MOUTH EVERY DAY   valsartan-hydrochlorothiazide (DIOVAN-HCT) 80-12.5 MG tablet TAKE 1 TABLET BY MOUTH EVERY DAY   VITAMIN E PO Take by mouth daily.   No facility-administered encounter medications on file as of 01/24/2021.    Surgical History: Past Surgical History:  Procedure Laterality Date   BREAST BIOPSY Right 2005   negative   BREAST BIOPSY Left 08/23/2007  negative   BREAST BIOPSY Left 10/24/2013   positive   BREAST BIOPSY Left 07/2002   neg   BREAST BIOPSY Left 08/23/2007   neg   BREAST SURGERY Left 10/2011   Cyst Aspirationapocrine metaplasia, ductal cells and bone cells, hypo-cellular   BREAST SURGERY Left 2009   ADH on stereotactic biopsy, 1.5 mm focus.   BREAST SURGERY Left 2003   fibrocystic changes with ductal hyperplasia without atypia.   BREAST SURGERY Left    mastectomy   BREAST SURGERY Left April 11, 2014   Removal of implant, debridement chest wall [place]   COLONOSCOPY  08/26/2013   Verdie Shire, M.D. normal.   CRYOABLATION  2005, 2010   CYST REMOVAL NECK  10/2011   Dr. Tami Ribas   ESOPHAGOGASTRODUODENOSCOPY (EGD) WITH PROPOFOL N/A 01/16/2017   Procedure: ESOPHAGOGASTRODUODENOSCOPY (EGD) WITH PROPOFOL;  Surgeon: Lucilla Lame, MD;  Location: [place];  Service: Gastroenterology;  Laterality: N/A;  Diabetic - oral meds   INCISION AND DRAINAGE Left Dec 2015, Feb 2016   Dr Tula Nakayama   MASTECTOMY Left 11/24/2013   positive   PORTACATH PLACEMENT  11/24/13    Medical History: Past Medical History:  Diagnosis Date   Arthritis 2006   Asthma    Breast cancer ([place]) 10/14/2013   18 mm, T1c, N0; ER/ PR positive,her 2 neu overexpressed. Adjuvant chemo/ herceptin.   Cancer [facility]) 2006   Renal cell carcinoma; cryosurgery treatment, right side   Cirrhosis ([place])    Collapsed lung 2007   Diabetes mellitus without complication ([place]) 7616   Metformin   Diffuse cystic mastopathy    Family history of breast cancer    Family history of liver cancer    Motion sickness    any moving vehicle   Orthodontics    braces   Personal history of chemotherapy    Sinus problem     Family History: Family History  Problem Relation Age of Onset   Liver cancer Father    Hemochromatosis Father    Liver disease Father    Diabetes Mother    Hypertension Mother    Breast cancer Paternal Aunt 32   Ovarian cancer Maternal Grandmother        unk primary, metastatic cancer   Hemachromatosis Daughter    Liver cancer Paternal Uncle    Hemochromatosis Paternal Uncle     Social History   Socioeconomic History   Marital status: Married    Spouse name: Not on file   Number of children: Not on file   Years of education: Not on file   Highest education level: Not on file  Occupational History   Not on file  Tobacco Use   Smoking status: Never   Smokeless tobacco: Never  Vaping Use   Vaping Use: Never used   Substance and Sexual Activity   Alcohol use: Not Currently    Comment: occasionally, none recently   Drug use: No   Sexual activity: Not on file  Other Topics Concern   Not on file  Social History Narrative   Not on file   Social Determinants of Health   Financial Resource Strain: Not on file  Food Insecurity: Not on file  Transportation Needs: Not on file  Physical Activity: Not on file  Stress: Not on file  Social Connections: Not on file  Intimate Partner Violence: Not on file      Review of Systems  Constitutional:  Negative  for chills, fatigue and unexpected weight change.  HENT:  Negative for congestion, postnasal drip, rhinorrhea, sneezing and sore throat.   Eyes:  Negative for redness.  Respiratory:  Negative for cough, chest tightness, shortness of breath and wheezing.   Cardiovascular:  Negative for chest pain and palpitations.  Gastrointestinal:  Negative for abdominal pain, constipation, diarrhea, nausea and vomiting.  Genitourinary:  Negative for dysuria and frequency.  Musculoskeletal:  Negative for arthralgias, back pain, joint swelling and neck pain.  Skin:  Negative for rash.  Neurological: Negative.  Negative for tremors and numbness.  Hematological:  Negative for adenopathy. Does not bruise/bleed easily.  Psychiatric/Behavioral:  Negative for behavioral problems (Depression), sleep disturbance and suicidal ideas. The patient is not nervous/anxious.    Vital Signs: BP 130/80   Pulse 83   Temp 97.8 F (36.6 C)   Resp 16   Ht 5' 6"  (1.676 m)   Wt 201 lb (91.2 kg)   SpO2 97%   BMI 32.44 kg/m    Physical Exam Vitals and nursing note reviewed.  Constitutional:      General: She is not in acute distress.    Appearance: She is well-developed. She is obese. She is not diaphoretic.  HENT:     Head: Normocephalic and atraumatic.     Mouth/Throat:     Pharynx: No oropharyngeal exudate.  Eyes:     Pupils: Pupils are equal, round, and reactive to  light.  Neck:     Thyroid: No thyromegaly.     Vascular: No JVD.     Trachea: No tracheal deviation.  Cardiovascular:     Rate and Rhythm: Normal rate and regular rhythm.     Heart sounds: Normal heart sounds. No murmur heard.   No friction rub. No gallop.  Pulmonary:     Effort: Pulmonary effort is normal. No respiratory distress.     Breath sounds: No wheezing or rales.  Chest:     Chest wall: No tenderness.  Abdominal:     General: Bowel sounds are normal.     Palpations: Abdomen is soft.  Musculoskeletal:        General: Normal range of motion.     Cervical back: Normal range of motion and neck supple.     Right lower leg: No edema.     Left lower leg: No edema.  Lymphadenopathy:     Cervical: No cervical adenopathy.  Skin:    General: Skin is warm and dry.  Neurological:     Mental Status: She is alert and oriented to person, place, and time.     Cranial Nerves: No cranial nerve deficit.  Psychiatric:        Behavior: Behavior normal.        Thought Content: Thought content normal.        Judgment: Judgment normal.       Assessment/Plan: 1. Type 2 diabetes mellitus with hyperglycemia, with long-term current use of insulin (HCC) - POCT HgB A1C is 6.3 which is improved from 6.8 on last check.  We will continue with current medications and improving diet and exercise.  2. Essential hypertension Stable, will continue current medications  3. Aortic atherosclerosis ([place]) Will continue on pravastatin and refer to cardiology for further evaluation - Ambulatory referral to Cardiology  4. Atherosclerosis of native coronary artery of native heart without angina pectoris Will continue on pravastatin and refer to cardiology for further evaluation and cardiac work-up - Ambulatory referral to Cardiology  5. Flu vaccine need -  Flu Vaccine MDCK QUAD PF   General Counseling: Najmo verbalizes understanding of the findings of todays visit and agrees with plan of treatment.  I have discussed any further diagnostic evaluation that may be needed or ordered today. We also reviewed her medications today. she has been encouraged to call the office with any questions or concerns that should arise related to todays visit.    Orders Placed This Encounter  Procedures   Flu Vaccine MDCK QUAD PF   Ambulatory referral to Cardiology   POCT HgB A1C    No orders of the defined types were placed in this encounter.   This patient was seen by Drema Dallas, PA-C in collaboration with Dr. Clayborn Bigness as a part of collaborative care agreement.   Total time spent:35 Minutes Time spent includes review of chart, medications, test results, and follow up plan with the patient.      Dr Lavera Guise Internal medicine

## 2021-02-04 ENCOUNTER — Other Ambulatory Visit: Payer: BC Managed Care – PPO

## 2021-02-04 ENCOUNTER — Ambulatory Visit: Payer: BC Managed Care – PPO | Admitting: Internal Medicine

## 2021-02-13 ENCOUNTER — Other Ambulatory Visit: Payer: Self-pay

## 2021-02-13 ENCOUNTER — Ambulatory Visit
Admission: RE | Admit: 2021-02-13 | Discharge: 2021-02-13 | Disposition: A | Discharge status: Home / Self Care | Payer: BC Managed Care – PPO | Source: Ambulatory Visit | Attending: General Surgery | Admitting: General Surgery

## 2021-02-13 DIAGNOSIS — Z1231 Encounter for screening mammogram for malignant neoplasm of breast: Secondary | ICD-10-CM | POA: Diagnosis present

## 2021-02-18 ENCOUNTER — Other Ambulatory Visit: Payer: Self-pay | Admitting: Internal Medicine

## 2021-02-18 ENCOUNTER — Other Ambulatory Visit: Payer: Self-pay | Admitting: General Surgery

## 2021-02-18 DIAGNOSIS — M159 Polyosteoarthritis, unspecified: Secondary | ICD-10-CM

## 2021-02-18 DIAGNOSIS — R928 Other abnormal and inconclusive findings on diagnostic imaging of breast: Secondary | ICD-10-CM

## 2021-02-18 DIAGNOSIS — N6489 Other specified disorders of breast: Secondary | ICD-10-CM

## 2021-02-22 ENCOUNTER — Encounter: Payer: Self-pay | Admitting: Internal Medicine

## 2021-02-22 ENCOUNTER — Inpatient Hospital Stay: Payer: BC Managed Care – PPO | Attending: Internal Medicine | Admitting: Internal Medicine

## 2021-02-22 ENCOUNTER — Other Ambulatory Visit: Payer: Self-pay

## 2021-02-22 ENCOUNTER — Inpatient Hospital Stay: Payer: BC Managed Care – PPO

## 2021-02-22 VITALS — BP 112/69 | HR 83 | Temp 98.3°F | Resp 20 | Wt 204.0 lb

## 2021-02-22 DIAGNOSIS — Z170 Estrogen receptor positive status [ER+]: Secondary | ICD-10-CM

## 2021-02-22 DIAGNOSIS — Z79899 Other long term (current) drug therapy: Secondary | ICD-10-CM | POA: Diagnosis not present

## 2021-02-22 DIAGNOSIS — Z794 Long term (current) use of insulin: Secondary | ICD-10-CM | POA: Insufficient documentation

## 2021-02-22 DIAGNOSIS — E041 Nontoxic single thyroid nodule: Secondary | ICD-10-CM | POA: Diagnosis not present

## 2021-02-22 DIAGNOSIS — Z13820 Encounter for screening for osteoporosis: Secondary | ICD-10-CM | POA: Diagnosis not present

## 2021-02-22 DIAGNOSIS — Z79810 Long term (current) use of selective estrogen receptor modulators (SERMs): Secondary | ICD-10-CM | POA: Diagnosis not present

## 2021-02-22 DIAGNOSIS — R9389 Abnormal findings on diagnostic imaging of other specified body structures: Secondary | ICD-10-CM | POA: Insufficient documentation

## 2021-02-22 DIAGNOSIS — E119 Type 2 diabetes mellitus without complications: Secondary | ICD-10-CM | POA: Insufficient documentation

## 2021-02-22 DIAGNOSIS — C50412 Malignant neoplasm of upper-outer quadrant of left female breast: Secondary | ICD-10-CM | POA: Diagnosis present

## 2021-02-22 DIAGNOSIS — M1990 Unspecified osteoarthritis, unspecified site: Secondary | ICD-10-CM | POA: Diagnosis not present

## 2021-02-22 LAB — CBC WITH DIFFERENTIAL/PLATELET
Abs Immature Granulocytes: 0.02 10*3/uL (ref 0.00–0.07)
Basophils Absolute: 0 10*3/uL (ref 0.0–0.1)
Basophils Relative: 1 %
Eosinophils Absolute: 0.2 10*3/uL (ref 0.0–0.5)
Eosinophils Relative: 2 %
HCT: 40.2 % (ref 36.0–46.0)
Hemoglobin: 13.3 g/dL (ref 12.0–15.0)
Immature Granulocytes: 0 %
Lymphocytes Relative: 33 %
Lymphs Abs: 2.3 10*3/uL (ref 0.7–4.0)
MCH: 31.3 pg (ref 26.0–34.0)
MCHC: 33.1 g/dL (ref 30.0–36.0)
MCV: 94.6 fL (ref 80.0–100.0)
Monocytes Absolute: 0.6 10*3/uL (ref 0.1–1.0)
Monocytes Relative: 8 %
Neutro Abs: 4 10*3/uL (ref 1.7–7.7)
Neutrophils Relative %: 56 %
Platelets: 212 10*3/uL (ref 150–400)
RBC: 4.25 MIL/uL (ref 3.87–5.11)
RDW: 12.1 % (ref 11.5–15.5)
WBC: 7.1 10*3/uL (ref 4.0–10.5)
nRBC: 0 % (ref 0.0–0.2)

## 2021-02-22 LAB — COMPREHENSIVE METABOLIC PANEL
ALT: 37 U/L (ref 0–44)
AST: 40 U/L (ref 15–41)
Albumin: 3.5 g/dL (ref 3.5–5.0)
Alkaline Phosphatase: 86 U/L (ref 38–126)
Anion gap: 4 — ABNORMAL LOW (ref 5–15)
BUN: 18 mg/dL (ref 8–23)
CO2: 32 mmol/L (ref 22–32)
Calcium: 8.8 mg/dL — ABNORMAL LOW (ref 8.9–10.3)
Chloride: 100 mmol/L (ref 98–111)
Creatinine, Ser: 0.6 mg/dL (ref 0.44–1.00)
GFR, Estimated: >60 mL/min (ref >60)
Glucose, Bld: 122 mg/dL — ABNORMAL HIGH (ref 70–99)
Potassium: 4.4 mmol/L (ref 3.5–5.1)
Sodium: 136 mmol/L (ref 135–145)
Total Bilirubin: 0.4 mg/dL (ref 0.3–1.2)
Total Protein: 6.8 g/dL (ref 6.5–8.1)

## 2021-02-22 NOTE — Assessment & Plan Note (Addendum)
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 On Taomoxifen 10 mg/day [20 mg-arthritis].  Tolerating well.  # Clinically no evidence of recurrence.  STABLE. Mammogram - BiRads- 0- awaiting evaluation with Dr.Byrnet  # Arthritis/musculoskeletal symptoms- G-1; STABLE.  continue Tamoxifen 69m/day.  # hot flashes/ vasomotor symptoms-from Tam-STABLE.   # BMD- [01/2019-]  T score=-1- continue  vitamin D- STABLE; mild low ca- 8.8.  Reviewed the bone density.  Recommend repeat bone density in 6 months.  Also recommend taking calcium; along with vitamin D.  # Right RCC-s/p cryoablation; MRI aug 2022- STABLE.  #Thyroid nodules-incidental [right-3 mm; left 2 nodules-3 and 4 mm] [awaiting repeat ultrasound in 12 months/September 2022].  Clinically this does not make me suspicious of metastatic renal cancer going to the thyroid.  Reminded speak to PCP regarding repeating ultrasound which is due around this time.  # DISPOSITION:  # follow up in 6 months- MD; cbc/cmp; BMD prior--Dr.B

## 2021-02-22 NOTE — Progress Notes (Signed)
[place] OFFICE PROGRESS NOTE  Patient Care Team: Lavera Guise, MD as PCP - General (Internal Medicine) Bary Castilla, Forest Gleason, MD (General Surgery) Dion Body, MD as Referring Physician (Family Medicine) Forest Gleason, MD (Inactive) (Oncology) Cammie Sickle, MD as Consulting Physician (Hematology and Oncology)  Cancer Staging Carcinoma of upper-outer quadrant of left breast in female, estrogen receptor positive [facility]) Staging form: Breast, AJCC 7th Edition - Clinical: No stage assigned - Unsigned    Oncology History Overview Note  # July 2016- 1.carcinoma of breast(left) T1 C. N0 M0 status post ultrasound-guided biopsy Estrogen receptor positive. Progesterone receptor positive.  HER-2/neu amplified by FISH (3.8)  status pos  tnipple sparing mastectomy on the left side with reconstruction (July, 2016) Final staging is  yP1C yNOsn M0  stage 1 c  2.MRSA infection associated with left breast implant (August, 2015) 3.patient started chemotherapy with [facility] from 11th August, 2015. chemotherapy on hold since November because of cellulitis in the chest wall. 4.started on Herceptin May 22, 2014.  Cause of recurrent chest wall  infection chemotherapy was put on hold. 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  Patient is on multiple antibiotic and as per infectious disease specialist similar to stay on antibiotic for 4 months. So chemotherapy has been discontinued. Maintenance Herceptin as well as patient was started on letrozole from June 12, 2014 6.  Port was removed because of Pseudomonas infection of the pocket (August, 2016) 7.  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  In the interim evaluated by Dr. Bary Castilla for abnormal mammogram awaiting further work-up.  Patient denies any nausea vomiting headaches.  Denies any chest pain.  No cough.  She has been trying to lose weight.  Diabetes better controlled  Review of Systems  Constitutional:  Negative for chills, diaphoresis, fever, malaise/fatigue and weight loss.  HENT:  Negative for nosebleeds and sore throat.   Eyes:  Negative for double vision.  Respiratory:  Negative for cough, hemoptysis, sputum production, shortness of breath and wheezing.   Cardiovascular:  Negative for chest pain, palpitations, orthopnea and leg swelling.  Gastrointestinal:  Negative for abdominal pain, blood in stool, constipation, diarrhea, heartburn, melena, nausea and vomiting.  Genitourinary:  Negative for dysuria, frequency and urgency.  Musculoskeletal:  Positive for joint pain.  Skin: Negative.  Negative for itching and rash.  Neurological:  Negative for dizziness, tingling, focal weakness, weakness and headaches.  Endo/Heme/Allergies:  Does not bruise/bleed easily.  Psychiatric/Behavioral:  Negative for depression. The patient is not nervous/anxious and does not have insomnia.     PAST MEDICAL HISTORY :  Past Medical History:  Diagnosis Date   Arthritis 2006   Asthma    Breast cancer ([place]) 10/14/2013  18 mm, T1c, N0; ER/ PR positive,her 2 neu overexpressed. Adjuvant chemo/ herceptin.   Cancer [facility]) 2006   Renal cell carcinoma; cryosurgery treatment,  right side   Cirrhosis ([place])    Collapsed lung 2007   Diabetes mellitus without complication ([place]) 6270   Metformin   Diffuse cystic mastopathy    Family history of breast cancer    Family history of liver cancer    Motion sickness    any moving vehicle   Orthodontics    braces   Personal history of chemotherapy    Sinus problem     PAST SURGICAL HISTORY :   Past Surgical History:  Procedure Laterality Date   BREAST BIOPSY Right 2005   negative   BREAST BIOPSY Left 08/23/2007   negative   BREAST BIOPSY Left 10/24/2013   positive   BREAST BIOPSY Left 07/2002   neg   BREAST BIOPSY Left 08/23/2007   neg   BREAST SURGERY Left 10/2011   Cyst Aspirationapocrine metaplasia, ductal cells and bone cells, hypo-cellular   BREAST SURGERY Left 2009   ADH on stereotactic biopsy, 1.5 mm focus.   BREAST SURGERY Left 2003   fibrocystic changes with ductal hyperplasia without atypia.   BREAST SURGERY Left    mastectomy   BREAST SURGERY Left April 11, 2014   Removal of implant, debridement chest wall [place]   COLONOSCOPY  08/26/2013   Verdie Shire, M.D. normal.   CRYOABLATION  2005, 2010   CYST REMOVAL NECK  10/2011   Dr. Tami Ribas   ESOPHAGOGASTRODUODENOSCOPY (EGD) WITH PROPOFOL N/A 01/16/2017   Procedure: ESOPHAGOGASTRODUODENOSCOPY (EGD) WITH PROPOFOL;  Surgeon: Lucilla Lame, MD;  Location: [place];  Service: Gastroenterology;  Laterality: N/A;  Diabetic - oral meds   INCISION AND DRAINAGE Left Dec 2015, Feb 2016   Dr Tula Nakayama   MASTECTOMY Left 11/24/2013   positive   PORTACATH PLACEMENT  11/24/13    FAMILY HISTORY :   Family History  Problem Relation Age of Onset   Liver cancer Father    Hemochromatosis Father    Liver disease Father    Diabetes Mother    Hypertension Mother    Breast cancer Paternal Aunt 68   Ovarian cancer Maternal Grandmother        unk primary, metastatic cancer   Hemachromatosis Daughter    Liver  cancer Paternal Uncle    Hemochromatosis Paternal Uncle     SOCIAL HISTORY:   Social History   Tobacco Use   Smoking status: Never   Smokeless tobacco: Never  Vaping Use   Vaping Use: Never used  Substance Use Topics   Alcohol use: Not Currently    Comment: occasionally, none recently   Drug use: No    ALLERGIES:  is allergic to exemestane, floxin [ofloxacin], gluten meal, levofloxacin, linezolid, sesame oil, taxotere [docetaxel], hydrocodone, invokana [canagliflozin], metformin and related, sulfa antibiotics, codeine, penicillins, and vancomycin.  MEDICATIONS:  Current Outpatient Medications  Medication Sig Dispense Refill   albuterol (VENTOLIN HFA) 108 (90 Base) MCG/ACT inhaler TAKE 2 PUFFS BY MOUTH EVERY 6 HOURS AS NEEDED FOR WHEEZE OR SHORTNESS OF BREATH 6.7 each 1   B COMPLEX-C PO Take by mouth daily.     Calcium Carb-Cholecalciferol (CALCIUM + D3) 600-200 MG-UNIT TABS Take 1 tablet by mouth 2 (two) times daily. 180 tablet 3   Calcium Carbonate-Vitamin D 600-200 MG-UNIT TABS Calcium 600 + D(3) 600 mg (1,500 mg)-200 unit tablet  TAKE 1 TABLET BY MOUTH 2 (TWO) TIMES DAILY.     diphenhydrAMINE (BENADRYL) 25 MG tablet Take 25 mg by mouth every 6 (six) hours as needed.     EPINEPHrine (EPIPEN 2-PAK) 0.3 mg/0.3 mL IJ SOAJ injection Inject 0.3 mLs (0.3 mg total) into the skin as needed. 1 each 1   etodolac (LODINE) 400 MG tablet TAKE 1 TABLET BY MOUTH TWICE A DAY AS NEEDED FOR PAIN 60 tablet 1   fluticasone (FLONASE) 50 MCG/ACT nasal spray Place into both nostrils daily.     fluticasone furoate-vilanterol (BREO ELLIPTA) 100-25 MCG/INH AEPB Inhale 1 puff into the lungs daily. 1 each 11   glucose blood (ONETOUCH VERIO) test strip Blood sugar testing done QD and as needed  E11.65 100 each 12   imipramine (TOFRANIL) 25 MG tablet TAKE 2 TABLETS BY MOUTH AT BEDTIME. 180 tablet 1   insulin degludec (TRESIBA FLEXTOUCH) 100 UNIT/ML FlexTouch Pen Inject 40 Units into the skin daily. Take in  the evening, raise dosage by 2 units every 3 days until blood sugar is 130 or below.     ipratropium-albuterol (DUONEB) 0.5-2.5 (3) MG/3ML SOLN USE 1 VIAL IN NEBULIZER EVERY 6 HOURS AS NEEDED 360 mL 0   linaclotide (LINZESS) 290 MCG CAPS capsule TAKE 1 CAPSULE BY MOUTH DAILY BEFORE BREAKFAST. 90 capsule 1   nadolol (CORGARD) 80 MG tablet Take 1 tablet (80 mg total) by mouth daily. 90 tablet 3   Omega-3 Fatty Acids (FISH OIL OMEGA-3 PO) Take by mouth daily.     pantoprazole (PROTONIX) 40 MG tablet TAKE 1 TABLET BY MOUTH EVERY DAY 90 tablet 3   pravastatin (PRAVACHOL) 20 MG tablet Take 1 tablet (20 mg total) by mouth daily. 90 tablet 1   Semaglutide, 1 MG/DOSE, (OZEMPIC, 1 MG/DOSE,) 2 MG/1.5ML SOPN Inject 1 mg into the skin once a week. 6 mL 2   tamoxifen (NOLVADEX) 10 MG tablet TAKE 1 TABLET BY MOUTH EVERY DAY 90 tablet 2   valsartan-hydrochlorothiazide (DIOVAN-HCT) 80-12.5 MG tablet TAKE 1 TABLET BY MOUTH EVERY DAY 90 tablet 2   VITAMIN E PO Take by mouth daily.     No current facility-administered medications for this visit.    PHYSICAL EXAMINATION: ECOG PERFORMANCE STATUS: 0 - Asymptomatic  BP 112/69   Pulse 83   Temp 98.3 F (36.8 C) (Tympanic)   Resp 20   Wt 204 lb (92.5 kg)   SpO2 93%   BMI 32.93 kg/m   Filed Weights   02/22/21 1005  Weight: 204 lb (92.5 kg)    Physical Exam HENT:     Head: Normocephalic and atraumatic.     Mouth/Throat:     Pharynx: No oropharyngeal exudate.  Eyes:     Pupils: Pupils are equal, round, and reactive to light.  Cardiovascular:     Rate and Rhythm: Normal rate and regular rhythm.  Pulmonary:     Effort: No respiratory distress.     Breath sounds: No wheezing.  Abdominal:     General: Bowel sounds are normal. There is no distension.     Palpations: Abdomen is soft. There is no mass.     Tenderness: There is no abdominal tenderness. There is no guarding or rebound.  Musculoskeletal:        General: No tenderness. Normal range of  motion.     Cervical back: Normal range of motion and neck supple.  Skin:    General: Skin is warm.     Comments:  Neurological:     Mental Status: She is alert and oriented to person, place, and time.  Psychiatric:        Mood and Affect: Affect normal.         LABORATORY DATA:  I have reviewed the data as listed    Component Value Date/Time   NA 136 02/22/2021 0931   NA 143 01/18/2020 0851   NA 138 09/04/2014 1358   K 4.4 02/22/2021 0931   K 4.0 09/04/2014 1358   CL 100 02/22/2021 0931   CL 99 (L) 09/04/2014 1358   CO2 32 02/22/2021 0931   CO2 31 09/04/2014 1358   GLUCOSE 122 (H) 02/22/2021 0931   GLUCOSE 136 (H) 09/04/2014 1358   BUN 18 02/22/2021 0931   BUN 14 01/18/2020 0851   BUN 18 09/04/2014 1358   CREATININE 0.60 02/22/2021 0931   CREATININE 0.53 09/04/2014 1358   CALCIUM 8.8 (L) 02/22/2021 0931   CALCIUM 9.7 09/04/2014 1358   PROT 6.8 02/22/2021 0931   PROT 6.5 01/18/2020 0851   PROT 7.8 09/04/2014 1358   ALBUMIN 3.5 02/22/2021 0931   ALBUMIN 3.8 01/18/2020 0851   ALBUMIN 4.1 09/04/2014 1358   AST 40 02/22/2021 0931   AST 32 09/04/2014 1358   ALT 37 02/22/2021 0931   ALT 33 09/04/2014 1358   ALKPHOS 86 02/22/2021 0931   ALKPHOS 73 09/04/2014 1358   BILITOT 0.4 02/22/2021 0931   BILITOT 0.5 01/18/2020 0851   BILITOT 0.6 09/04/2014 1358   GFRNONAA >60 02/22/2021 0931   GFRNONAA >60 09/04/2014 1358   GFRAA 112 01/18/2020 0851   GFRAA >60 09/04/2014 1358    No results found for: SPEP, UPEP  Lab Results  Component Value Date   WBC 7.1 02/22/2021   NEUTROABS 4.0 02/22/2021   HGB 13.3 02/22/2021   HCT 40.2 02/22/2021   MCV 94.6 02/22/2021   PLT 212 02/22/2021      Chemistry      Component Value Date/Time   NA 136 02/22/2021 0931   NA 143 01/18/2020 0851   NA 138 09/04/2014 1358   K 4.4 02/22/2021 0931   K 4.0 09/04/2014 1358   CL 100 02/22/2021 0931   CL 99 (L) 09/04/2014 1358   CO2 32 02/22/2021 0931   CO2 31 09/04/2014 1358    BUN 18 02/22/2021 0931   BUN 14 01/18/2020 0851   BUN 18 09/04/2014 1358   CREATININE 0.60 02/22/2021 0931   CREATININE 0.53 09/04/2014 1358      Component Value Date/Time   CALCIUM 8.8 (L) 02/22/2021 0931   CALCIUM 9.7 09/04/2014 1358   ALKPHOS 86 02/22/2021 0931   ALKPHOS 73 09/04/2014 1358   AST 40 02/22/2021 0931   AST 32 09/04/2014 1358   ALT 37 02/22/2021 0931   ALT 33 09/04/2014 1358   BILITOT 0.4 02/22/2021 0931   BILITOT 0.5 01/18/2020 0851   BILITOT 0.6 09/04/2014 1358       RADIOGRAPHIC STUDIES: I have personally reviewed the radiological images as listed and agreed with the findings in the report. No results found.   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 On Taomoxifen 10 mg/day.  Tolerating well. Clinically no evidence of recurrence.  STABLE. Mammogram - BiRads- 0- awaiting evaluation with Dr.Byrnet  #Arthritis/musculoskeletal symptoms- G-1; STABLE.  continue Tamoxifen 62m/day.  # hot flashes/ vasomotor symptoms-from  Tam-STABLE.   # BMD- [01/2019-]  T score=-1- continue calcium and vitamin D- STABLE; mild low ca- 8.8.   # Right RCC-s/p cryoablation; MRI aug 2022- STABLE.  #Thyroid nodules-incidental [awaiting repeat ultrasound in 12 months/September 2022].  Clinically this does not make me suspicious of metastatic renal cancer going to the thyroid.  Reminded   # DISPOSITION:  # follow up in 6 months- MD; cbc/cmp; BMD prior--Dr.B     Orders Placed This Encounter  Procedures   DG Bone Density    Standing Status:   Future    Standing Expiration Date:   02/22/2022    Order Specific Question:   Reason for Exam (SYMPTOM  OR DIAGNOSIS REQUIRED)    Answer:   osteoporosis/osteopenia    Order Specific Question:   Preferred imaging location?    Answer:   [facility]   CBC with Differential     Standing Status:   Future    Standing Expiration Date:   02/22/2022   Comprehensive metabolic panel    Standing Status:   Future    Standing Expiration Date:   02/22/2022   All questions were answered. The patient knows to call the clinic with any problems, questions or concerns.      Cammie Sickle, MD 02/22/2021 11:57 AM

## 2021-02-22 NOTE — Progress Notes (Signed)
Patient here for follow up  she had manual breast exam and mammogram 10/8 and 10/14. She has no concerns today.

## 2021-02-23 LAB — AFP TUMOR MARKER: AFP, Serum, Tumor Marker: 4.4 ng/mL (ref 0.0–9.2)

## 2021-02-24 ENCOUNTER — Other Ambulatory Visit: Payer: Self-pay

## 2021-02-24 DIAGNOSIS — K580 Irritable bowel syndrome with diarrhea: Secondary | ICD-10-CM

## 2021-02-24 MED ORDER — IMIPRAMINE HCL 25 MG PO TABS: 50.0000 mg | ORAL_TABLET | Freq: Every day | ORAL | 1 refills | Status: DC

## 2021-02-25 ENCOUNTER — Ambulatory Visit: Payer: BC Managed Care – PPO | Admitting: Internal Medicine

## 2021-03-01 ENCOUNTER — Encounter: Payer: Self-pay | Admitting: Cardiology

## 2021-03-01 ENCOUNTER — Ambulatory Visit: Payer: BC Managed Care – PPO | Admitting: Cardiology

## 2021-03-01 ENCOUNTER — Other Ambulatory Visit: Payer: Self-pay

## 2021-03-01 VITALS — BP 120/76 | HR 69 | Ht 66.0 in | Wt 204.0 lb

## 2021-03-01 DIAGNOSIS — E7800 Pure hypercholesterolemia, unspecified: Secondary | ICD-10-CM

## 2021-03-01 DIAGNOSIS — I10 Essential (primary) hypertension: Secondary | ICD-10-CM

## 2021-03-01 DIAGNOSIS — I2510 Atherosclerotic heart disease of native coronary artery without angina pectoris: Secondary | ICD-10-CM

## 2021-03-01 MED ORDER — ASPIRIN EC 81 MG PO TBEC: 81.0000 mg | DELAYED_RELEASE_TABLET | Freq: Every day | ORAL | Status: DC

## 2021-03-01 NOTE — Progress Notes (Signed)
Cardiology Office Note:    Date:  03/01/2021   ID:  [name], DOB March 13, 1961, MRN [id number]  PCP:  Danielle Guise, MD   [facility] Providers Cardiologist:  Kate Sable, MD    Referring MD: Mylinda Latina, PA*   Chief Complaint  Patient presents with   NEW patient-eval of aortic atherosclerosis shown on CT scan   Danielle Wallace is a 60 y.o. female who is being seen today for the evaluation of aortic atherosclerosis at the request of McDonough, Si Gaul, PA*.   History of Present Illness:    Danielle Wallace is a 60 y.o. female with a hx of hypertension, hyperlipidemia, diabetes presenting due to aortic atherosclerosis and coronary artery calcifications..  Patient had chest CT 08/27/2020 due to persistent cough and shortness of breath.  Atherosclerosis was noted in the descending aorta and LAD.  She denies symptoms of chest pain or shortness of breath at rest or with exertion.  Denies smoking but states being exposed to secondhand smoke up to age 53.  Takes her medications as prescribed, blood pressure and cholesterol are usually well controlled.  Past Medical History:  Diagnosis Date   Arthritis 2006   Asthma    Breast cancer ([place]) 10/14/2013   18 mm, T1c, N0; ER/ PR positive,her 2 neu overexpressed. Adjuvant chemo/ herceptin.   Cancer [facility]) 2006   Renal cell carcinoma; cryosurgery treatment, right side   Cirrhosis ([place])    Collapsed lung 2007   Diabetes mellitus without complication ([place]) 6384   Metformin   Diffuse cystic mastopathy    Family history of breast cancer    Family history of liver cancer    Motion sickness    any moving vehicle   Orthodontics    braces   Personal history of chemotherapy    Sinus problem     Past Surgical History:  Procedure Laterality Date   BREAST BIOPSY Right 2005   negative   BREAST BIOPSY Left 08/23/2007   negative   BREAST BIOPSY Left 10/24/2013   positive   BREAST BIOPSY Left 07/2002   neg   BREAST  BIOPSY Left 08/23/2007   neg   BREAST SURGERY Left 10/2011   Cyst Aspirationapocrine metaplasia, ductal cells and bone cells, hypo-cellular   BREAST SURGERY Left 2009   ADH on stereotactic biopsy, 1.5 mm focus.   BREAST SURGERY Left 2003   fibrocystic changes with ductal hyperplasia without atypia.   BREAST SURGERY Left    mastectomy   BREAST SURGERY Left April 11, 2014   Removal of implant, debridement chest wall [place]   COLONOSCOPY  08/26/2013   Verdie Shire, M.D. normal.   CRYOABLATION  2005, 2010   CYST REMOVAL NECK  10/2011   Dr. Tami Ribas   ESOPHAGOGASTRODUODENOSCOPY (EGD) WITH PROPOFOL N/A 01/16/2017   Procedure: ESOPHAGOGASTRODUODENOSCOPY (EGD) WITH PROPOFOL;  Surgeon: Lucilla Lame, MD;  Location: [place];  Service: Gastroenterology;  Laterality: N/A;  Diabetic - oral meds   INCISION AND DRAINAGE Left Dec 2015, Feb 2016   Dr Tula Nakayama   MASTECTOMY Left 11/24/2013   positive   PORTACATH PLACEMENT  11/24/13    Current Medications: Current Meds  Medication Sig   albuterol (VENTOLIN HFA) 108 (90 Base) MCG/ACT inhaler TAKE 2 PUFFS BY MOUTH EVERY 6 HOURS AS NEEDED FOR WHEEZE OR SHORTNESS OF BREATH   aspirin EC 81 MG tablet Take 1 tablet (81 mg total) by mouth  daily. Swallow whole.   B COMPLEX-C PO Take by mouth daily.   Calcium Carb-Cholecalciferol (CALCIUM + D3) 600-200 MG-UNIT TABS Take 1 tablet by mouth 2 (two) times daily.   Calcium Carbonate-Vitamin D 600-200 MG-UNIT TABS Calcium 600 + D(3) 600 mg (1,500 mg)-200 unit tablet  TAKE 1 TABLET BY MOUTH 2 (TWO) TIMES DAILY.   diphenhydrAMINE (BENADRYL) 25 MG tablet Take 25 mg by mouth every 6 (six) hours as needed.   EPINEPHrine (EPIPEN 2-PAK) 0.3 mg/0.3 mL IJ SOAJ injection Inject 0.3 mLs (0.3 mg total) into the skin as needed.   etodolac (LODINE) 400 MG tablet TAKE 1 TABLET BY MOUTH TWICE A DAY AS NEEDED FOR PAIN   fluticasone (FLONASE) 50 MCG/ACT nasal spray Place into both  nostrils daily.   fluticasone furoate-vilanterol (BREO ELLIPTA) 100-25 MCG/INH AEPB Inhale 1 puff into the lungs daily.   glucose blood (ONETOUCH VERIO) test strip Blood sugar testing done QD and as needed  E11.65   imipramine (TOFRANIL) 25 MG tablet Take 2 tablets (50 mg total) by mouth at bedtime.   insulin degludec (TRESIBA FLEXTOUCH) 100 UNIT/ML FlexTouch Pen Inject 40 Units into the skin daily. Take in the evening, raise dosage by 2 units every 3 days until blood sugar is 130 or below.   methylPREDNISolone (MEDROL DOSEPAK) 4 MG TBPK tablet Take 1 tablet by mouth as directed.   nadolol (CORGARD) 80 MG tablet Take 1 tablet (80 mg total) by mouth daily.   pantoprazole (PROTONIX) 40 MG tablet TAKE 1 TABLET BY MOUTH EVERY DAY   pravastatin (PRAVACHOL) 20 MG tablet Take 1 tablet (20 mg total) by mouth daily.   Semaglutide, 1 MG/DOSE, (OZEMPIC, 1 MG/DOSE,) 2 MG/1.5ML SOPN Inject 1 mg into the skin once a week.   tamoxifen (NOLVADEX) 10 MG tablet TAKE 1 TABLET BY MOUTH EVERY DAY   valsartan-hydrochlorothiazide (DIOVAN-HCT) 80-12.5 MG tablet TAKE 1 TABLET BY MOUTH EVERY DAY     Allergies:   Exemestane, Floxin [ofloxacin], Gluten meal, Levofloxacin, Linezolid, Sesame oil, Taxotere [docetaxel], Hydrocodone, Invokana [canagliflozin], Metformin and related, Sulfa antibiotics, Codeine, Penicillins, and Vancomycin   Social History   Socioeconomic History   Marital status: Married    Spouse name: Not on file   Number of children: Not on file   Years of education: Not on file   Highest education level: Not on file  Occupational History   Not on file  Tobacco Use   Smoking status: Never   Smokeless tobacco: Never  Vaping Use   Vaping Use: Never used  Substance and Sexual Activity   Alcohol use: Not Currently    Comment: occasionally, none recently   Drug use: No   Sexual activity: Not on file  Other Topics Concern   Not on file  Social History Narrative   Not on file   Social Determinants  of Health   Financial Resource Strain: Not on file  Food Insecurity: Not on file  Transportation Needs: Not on file  Physical Activity: Not on file  Stress: Not on file  Social Connections: Not on file     Family History: The patient's family history includes Breast cancer (age of onset: 35) in her paternal aunt; Diabetes in her mother; Hemachromatosis in her daughter; Hemochromatosis in her father and paternal uncle; Hypertension in her mother; Liver cancer in her father and paternal uncle; Liver disease in her father; Ovarian cancer in her maternal grandmother.  ROS:   Please see the history of present illness.  All other systems reviewed and are negative.  EKGs/Labs/Other Studies Reviewed:    The following studies were reviewed today:   EKG:  EKG is  ordered today.  The ekg ordered today demonstrates normal sinus rhythm, nonspecific T wave changes.  Recent Labs: 02/22/2021: ALT 37; BUN 18; Creatinine, Ser 0.60; Hemoglobin 13.3; Platelets 212; Potassium 4.4; Sodium 136  Recent Lipid Panel    Component Value Date/Time   CHOL 129 01/18/2020 0851   TRIG 99 01/18/2020 0851   HDL 40 01/18/2020 0851   LDLCALC 70 01/18/2020 0851     Risk Assessment/Calculations:          Physical Exam:    VS:  BP 120/76 (BP Location: Right Arm, Patient Position: Sitting, Cuff Size: Large)   Pulse 69   Ht 5' 6"  (1.676 m)   Wt 204 lb (92.5 kg)   SpO2 97%   BMI 32.93 kg/m     Wt Readings from Last 3 Encounters:  03/01/21 204 lb (92.5 kg)  02/22/21 204 lb (92.5 kg)  01/24/21 201 lb (91.2 kg)     GEN:  Well nourished, well developed in no acute distress HEENT: Normal NECK: No JVD; No carotid bruits LYMPHATICS: No lymphadenopathy CARDIAC: RRR, no murmurs, rubs, gallops RESPIRATORY:  Clear to auscultation without rales, wheezing or rhonchi  ABDOMEN: Soft, non-tender, non-distended MUSCULOSKELETAL:  No edema; No deformity  SKIN: Warm and dry NEUROLOGIC:  Alert and oriented x  3 PSYCHIATRIC:  Normal affect   ASSESSMENT:    1. Coronary artery disease involving native coronary artery of native heart without angina pectoris   2. Primary hypertension   3. Pure hypercholesterolemia    PLAN:    In order of problems listed above:  Coronary artery calcifications noted on chest CT, descending aortic calcifications.  Denies chest pain or shortness of breath.  Start aspirin 81 mg daily, continue Pravachol 20 mg daily.  LDL is at goal.  Get echocardiogram to evaluate any cardiac dysfunction or structural abnormalities. Hypertension, BP controlled.  Continue valsartan, HCTZ. Hyperlipidemia, cholesterol controlled.  Continue pravastatin 20 mg daily.  Follow-up in 6 months.  Plan for earlier appointment if echocardiogram shows significant abnormalities.    Medication Adjustments/Labs and Tests Ordered: Current medicines are reviewed at length with the patient today.  Concerns regarding medicines are outlined above.  Orders Placed This Encounter  Procedures   EKG 12-Lead   ECHOCARDIOGRAM COMPLETE    Meds ordered this encounter  Medications   aspirin EC 81 MG tablet    Sig: Take 1 tablet (81 mg total) by mouth daily. Swallow whole.     Patient Instructions  Medication Instructions:  Your physician has recommended you make the following change in your medication:   START Aspirin 81 mg daily. This can be purchased over the counter.  *If you need a refill on your cardiac medications before your next appointment, please call your pharmacy*   Lab Work: None ordered  If you have labs (blood work) drawn today and your tests are completely normal, you will receive your results only by: [place] (if you have MyChart) OR A paper copy in the mail If you have any lab test that is abnormal or we need to change your treatment, we will call you to review the results.   Testing/Procedures: Your physician has requested that you have an echocardiogram.  Echocardiography is a painless test that uses sound waves to create images of your heart. It provides your doctor with information about the size  and shape of your heart and how well your heart's chambers and valves are working. This procedure takes approximately one hour. There are no restrictions for this procedure.    Follow-Up: At [facility], you and your health needs are our priority.  As part of our continuing mission to provide you with exceptional heart care, we have created designated Provider Care Teams.  These Care Teams include your primary Cardiologist (physician) and Advanced Practice Providers (APPs -  Physician Assistants and Nurse Practitioners) who all work together to provide you with the care you need, when you need it.  We recommend signing up for the patient portal called "MyChart".  Sign up information is provided on this After Visit Summary.  MyChart is used to connect with patients for Virtual Visits (Telemedicine).  Patients are able to view lab/test results, encounter notes, upcoming appointments, etc.  Non-urgent messages can be sent to your provider as well.   To learn more about what you can do with MyChart, go to [email or web address].    Your next appointment:   Your physician wants you to follow-up in: 6 months You will receive a reminder letter in the mail two months in advance. If you don't receive a letter, please call our office to schedule the follow-up appointment.   The format for your next appointment:   In Person  Provider:   You may see Kate Sable, MD or one of the following Advanced Practice Providers on your designated Care Team:   Murray Hodgkins, NP Christell Faith, PA-C Marrianne Mood, PA-C Cadence Kathlen Mody, [place]   Other Instructions Echocardiogram An echocardiogram is a test that uses sound waves (ultrasound) to produce images of the heart. Images from an echocardiogram can provide important information about: Heart size and  shape. The size and thickness and movement of your heart's walls. Heart muscle function and strength. Heart valve function or if you have stenosis. Stenosis is when the heart valves are too narrow. If blood is flowing backward through the heart valves (regurgitation). A tumor or infectious growth around the heart valves. Areas of heart muscle that are not working well because of poor blood flow or injury from a heart attack. Aneurysm detection. An aneurysm is a weak or damaged part of an artery wall. The wall bulges out from the normal force of blood pumping through the body. Tell a health care provider about: Any allergies you have. All medicines you are taking, including vitamins, herbs, eye drops, creams, and over-the-counter medicines. Any blood disorders you have. Any surgeries you have had. Any medical conditions you have. Whether you are pregnant or may be pregnant. What are the risks? Generally, this is a safe test. However, problems may occur, including an allergic reaction to dye (contrast) that may be used during the test. What happens before the test? No specific preparation is needed. You may eat and drink normally. What happens during the test?  You will take off your clothes from the waist up and put on a hospital gown. Electrodes or electrocardiogram (ECG)patches may be placed on your chest. The electrodes or patches are then connected to a device that monitors your heart rate and rhythm. You will lie down on a table for an ultrasound exam. A gel will be applied to your chest to help sound waves pass through your skin. A handheld device, called a transducer, will be pressed against your chest and moved over your heart. The transducer produces sound waves that travel to your heart and bounce back (or "  echo" back) to the transducer. These sound waves will be captured in real-time and changed into images of your heart that can be viewed on a video monitor. The images will be  recorded on a computer and reviewed by your health care provider. You may be asked to change positions or hold your breath for a short time. This makes it easier to get different views or better views of your heart. In some cases, you may receive an image enhancer through an IV in one of your veins. This can improve the quality of the pictures from your heart. The procedure may vary among health care providers and hospitals. What can I expect after the test? You may return to your normal, everyday life, including diet, activities, and medicines, unless your health care provider tells you not to do that. Follow these instructions at home: It is up to you to get the results of your test. Ask your health care provider, or the department that is doing the test, when your results will be ready. Keep all follow-up visits. This is important. Summary An echocardiogram is a test that uses sound waves (ultrasound) to produce images of the heart. Images from an echocardiogram can provide important information about the size and shape of your heart, heart muscle function, heart valve function, and other possible heart problems. You do not need to do anything to prepare before this test. You may eat and drink normally. After the echocardiogram is completed, you may return to your normal, everyday life, unless your health care provider tells you not to do that. This information is not intended to replace advice given to you by your health care provider. Make sure you discuss any questions you have with your health care provider. Document Revised: 12/20/2019 Document Reviewed: 12/20/2019 Elsevier Patient Education  2022 [place], Kate Sable, MD  03/01/2021 10:47 AM    [place]

## 2021-03-01 NOTE — Patient Instructions (Signed)
Medication Instructions:  Your physician has recommended you make the following change in your medication:   START Aspirin 81 mg daily. This can be purchased over the counter.  *If you need a refill on your cardiac medications before your next appointment, please call your pharmacy*   Lab Work: None ordered  If you have labs (blood work) drawn today and your tests are completely normal, you will receive your results only by: [place] (if you have MyChart) OR A paper copy in the mail If you have any lab test that is abnormal or we need to change your treatment, we will call you to review the results.   Testing/Procedures: Your physician has requested that you have an echocardiogram. Echocardiography is a painless test that uses sound waves to create images of your heart. It provides your doctor with information about the size and shape of your heart and how well your heart's chambers and valves are working. This procedure takes approximately one hour. There are no restrictions for this procedure.    Follow-Up: At [facility], you and your health needs are our priority.  As part of our continuing mission to provide you with exceptional heart care, we have created designated Provider Care Teams.  These Care Teams include your primary Cardiologist (physician) and Advanced Practice Providers (APPs -  Physician Assistants and Nurse Practitioners) who all work together to provide you with the care you need, when you need it.  We recommend signing up for the patient portal called "MyChart".  Sign up information is provided on this After Visit Summary.  MyChart is used to connect with patients for Virtual Visits (Telemedicine).  Patients are able to view lab/test results, encounter notes, upcoming appointments, etc.  Non-urgent messages can be sent to your provider as well.   To learn more about what you can do with MyChart, go to [email or web address].    Your next appointment:    Your physician wants you to follow-up in: 6 months You will receive a reminder letter in the mail two months in advance. If you don't receive a letter, please call our office to schedule the follow-up appointment.   The format for your next appointment:   In Person  Provider:   You may see Kate Sable, MD or one of the following Advanced Practice Providers on your designated Care Team:   Murray Hodgkins, NP Christell Faith, PA-C Marrianne Mood, PA-C Cadence Kathlen Mody, [place]   Other Instructions Echocardiogram An echocardiogram is a test that uses sound waves (ultrasound) to produce images of the heart. Images from an echocardiogram can provide important information about: Heart size and shape. The size and thickness and movement of your heart's walls. Heart muscle function and strength. Heart valve function or if you have stenosis. Stenosis is when the heart valves are too narrow. If blood is flowing backward through the heart valves (regurgitation). A tumor or infectious growth around the heart valves. Areas of heart muscle that are not working well because of poor blood flow or injury from a heart attack. Aneurysm detection. An aneurysm is a weak or damaged part of an artery wall. The wall bulges out from the normal force of blood pumping through the body. Tell a health care provider about: Any allergies you have. All medicines you are taking, including vitamins, herbs, eye drops, creams, and over-the-counter medicines. Any blood disorders you have. Any surgeries you have had. Any medical conditions you have. Whether you are pregnant or may be pregnant. What are the  risks? Generally, this is a safe test. However, problems may occur, including an allergic reaction to dye (contrast) that may be used during the test. What happens before the test? No specific preparation is needed. You may eat and drink normally. What happens during the test?  You will take off your clothes from  the waist up and put on a hospital gown. Electrodes or electrocardiogram (ECG)patches may be placed on your chest. The electrodes or patches are then connected to a device that monitors your heart rate and rhythm. You will lie down on a table for an ultrasound exam. A gel will be applied to your chest to help sound waves pass through your skin. A handheld device, called a transducer, will be pressed against your chest and moved over your heart. The transducer produces sound waves that travel to your heart and bounce back (or "echo" back) to the transducer. These sound waves will be captured in real-time and changed into images of your heart that can be viewed on a video monitor. The images will be recorded on a computer and reviewed by your health care provider. You may be asked to change positions or hold your breath for a short time. This makes it easier to get different views or better views of your heart. In some cases, you may receive an image enhancer through an IV in one of your veins. This can improve the quality of the pictures from your heart. The procedure may vary among health care providers and hospitals. What can I expect after the test? You may return to your normal, everyday life, including diet, activities, and medicines, unless your health care provider tells you not to do that. Follow these instructions at home: It is up to you to get the results of your test. Ask your health care provider, or the department that is doing the test, when your results will be ready. Keep all follow-up visits. This is important. Summary An echocardiogram is a test that uses sound waves (ultrasound) to produce images of the heart. Images from an echocardiogram can provide important information about the size and shape of your heart, heart muscle function, heart valve function, and other possible heart problems. You do not need to do anything to prepare before this test. You may eat and drink  normally. After the echocardiogram is completed, you may return to your normal, everyday life, unless your health care provider tells you not to do that. This information is not intended to replace advice given to you by your health care provider. Make sure you discuss any questions you have with your health care provider. Document Revised: 12/20/2019 Document Reviewed: 12/20/2019 Elsevier Patient Education  2022 [employer].

## 2021-03-04 ENCOUNTER — Ambulatory Visit
Admission: RE | Admit: 2021-03-04 | Discharge: 2021-03-04 | Disposition: A | Discharge status: Home / Self Care | Payer: BC Managed Care – PPO | Source: Ambulatory Visit | Attending: General Surgery | Admitting: General Surgery

## 2021-03-04 ENCOUNTER — Other Ambulatory Visit: Payer: Self-pay

## 2021-03-04 DIAGNOSIS — R928 Other abnormal and inconclusive findings on diagnostic imaging of breast: Secondary | ICD-10-CM | POA: Insufficient documentation

## 2021-03-04 DIAGNOSIS — N6489 Other specified disorders of breast: Secondary | ICD-10-CM | POA: Diagnosis present

## 2021-03-04 DIAGNOSIS — K580 Irritable bowel syndrome with diarrhea: Secondary | ICD-10-CM

## 2021-03-04 DIAGNOSIS — E1165 Type 2 diabetes mellitus with hyperglycemia: Secondary | ICD-10-CM

## 2021-03-04 DIAGNOSIS — M159 Polyosteoarthritis, unspecified: Secondary | ICD-10-CM

## 2021-03-04 DIAGNOSIS — K2100 Gastro-esophageal reflux disease with esophagitis, without bleeding: Secondary | ICD-10-CM

## 2021-03-04 MED ORDER — ETODOLAC 400 MG PO TABS: 400.0000 mg | ORAL_TABLET | Freq: Two times a day (BID) | ORAL | 1 refills | Status: DC | PRN

## 2021-03-04 MED ORDER — ONETOUCH VERIO VI STRP
ORAL_STRIP | 12 refills | Status: DC
Start: 2021-03-04 — End: 2021-03-12

## 2021-03-04 MED ORDER — PANTOPRAZOLE SODIUM 40 MG PO TBEC: 40.0000 mg | DELAYED_RELEASE_TABLET | Freq: Every day | ORAL | 3 refills | Status: DC

## 2021-03-04 MED ORDER — VALSARTAN-HYDROCHLOROTHIAZIDE 80-12.5 MG PO TABS: 1.0000 | ORAL_TABLET | Freq: Every day | ORAL | 2 refills | Status: DC

## 2021-03-04 MED ORDER — FLUTICASONE FUROATE-VILANTEROL 100-25 MCG/ACT IN AEPB: 1.0000 | INHALATION_SPRAY | Freq: Every day | RESPIRATORY_TRACT | 3 refills | Status: DC

## 2021-03-04 MED ORDER — IMIPRAMINE HCL 25 MG PO TABS: 50.0000 mg | ORAL_TABLET | Freq: Every day | ORAL | 1 refills | Status: DC

## 2021-03-04 MED ORDER — OZEMPIC (1 MG/DOSE) 2 MG/1.5ML ~~LOC~~ SOPN: 1.0000 mg | PEN_INJECTOR | SUBCUTANEOUS | 2 refills | Status: DC

## 2021-03-06 ENCOUNTER — Other Ambulatory Visit: Payer: Self-pay | Admitting: *Deleted

## 2021-03-06 ENCOUNTER — Other Ambulatory Visit: Payer: Self-pay

## 2021-03-06 DIAGNOSIS — M159 Polyosteoarthritis, unspecified: Secondary | ICD-10-CM

## 2021-03-06 DIAGNOSIS — E1165 Type 2 diabetes mellitus with hyperglycemia: Secondary | ICD-10-CM

## 2021-03-06 DIAGNOSIS — K580 Irritable bowel syndrome with diarrhea: Secondary | ICD-10-CM

## 2021-03-06 DIAGNOSIS — K2100 Gastro-esophageal reflux disease with esophagitis, without bleeding: Secondary | ICD-10-CM

## 2021-03-06 MED ORDER — TAMOXIFEN CITRATE 10 MG PO TABS
10.0000 mg | ORAL_TABLET | Freq: Every day | ORAL | 2 refills | Status: DC
Start: 2021-03-06 — End: 2021-12-02

## 2021-03-06 NOTE — Telephone Encounter (Signed)
Refills were done yesterday

## 2021-03-07 ENCOUNTER — Other Ambulatory Visit: Payer: Self-pay | Admitting: Internal Medicine

## 2021-03-07 ENCOUNTER — Encounter: Payer: Self-pay | Admitting: Internal Medicine

## 2021-03-07 DIAGNOSIS — E041 Nontoxic single thyroid nodule: Secondary | ICD-10-CM

## 2021-03-07 NOTE — Progress Notes (Signed)
Sent for CT// U/S THROID

## 2021-03-11 ENCOUNTER — Ambulatory Visit: Payer: BC Managed Care – PPO | Admitting: Internal Medicine

## 2021-03-12 ENCOUNTER — Other Ambulatory Visit: Payer: Self-pay

## 2021-03-12 ENCOUNTER — Ambulatory Visit: Payer: BC Managed Care – PPO | Admitting: Internal Medicine

## 2021-03-12 DIAGNOSIS — M159 Polyosteoarthritis, unspecified: Secondary | ICD-10-CM

## 2021-03-12 DIAGNOSIS — E1165 Type 2 diabetes mellitus with hyperglycemia: Secondary | ICD-10-CM

## 2021-03-12 DIAGNOSIS — K580 Irritable bowel syndrome with diarrhea: Secondary | ICD-10-CM

## 2021-03-12 DIAGNOSIS — K2100 Gastro-esophageal reflux disease with esophagitis, without bleeding: Secondary | ICD-10-CM

## 2021-03-12 MED ORDER — ONETOUCH VERIO VI STRP: ORAL_STRIP | 12 refills | Status: DC

## 2021-03-12 MED ORDER — IMIPRAMINE HCL 25 MG PO TABS: 50.0000 mg | ORAL_TABLET | Freq: Every day | ORAL | 1 refills | Status: DC

## 2021-03-12 MED ORDER — FLUTICASONE FUROATE-VILANTEROL 100-25 MCG/ACT IN AEPB: 1.0000 | INHALATION_SPRAY | Freq: Every day | RESPIRATORY_TRACT | 3 refills | Status: DC

## 2021-03-12 MED ORDER — PANTOPRAZOLE SODIUM 40 MG PO TBEC: 40.0000 mg | DELAYED_RELEASE_TABLET | Freq: Every day | ORAL | 3 refills | Status: DC

## 2021-03-12 MED ORDER — ETODOLAC 400 MG PO TABS: 400.0000 mg | ORAL_TABLET | Freq: Two times a day (BID) | ORAL | 1 refills | Status: DC | PRN

## 2021-03-12 MED ORDER — VALSARTAN-HYDROCHLOROTHIAZIDE 80-12.5 MG PO TABS: 1.0000 | ORAL_TABLET | Freq: Every day | ORAL | 2 refills | Status: DC

## 2021-03-12 MED ORDER — OZEMPIC (1 MG/DOSE) 2 MG/1.5ML ~~LOC~~ SOPN: 1.0000 mg | PEN_INJECTOR | SUBCUTANEOUS | 2 refills | Status: DC

## 2021-03-13 ENCOUNTER — Other Ambulatory Visit: Payer: Self-pay

## 2021-03-13 DIAGNOSIS — K7460 Unspecified cirrhosis of liver: Secondary | ICD-10-CM

## 2021-03-13 MED ORDER — NADOLOL 80 MG PO TABS: 80.0000 mg | ORAL_TABLET | Freq: Every day | ORAL | 3 refills | Status: DC

## 2021-03-15 ENCOUNTER — Other Ambulatory Visit: Payer: Self-pay

## 2021-03-15 ENCOUNTER — Ambulatory Visit
Admission: RE | Admit: 2021-03-15 | Discharge: 2021-03-15 | Disposition: A | Discharge status: Home / Self Care | Payer: BC Managed Care – PPO | Source: Ambulatory Visit | Attending: Internal Medicine | Admitting: Internal Medicine

## 2021-03-15 DIAGNOSIS — E041 Nontoxic single thyroid nodule: Secondary | ICD-10-CM | POA: Insufficient documentation

## 2021-03-22 ENCOUNTER — Other Ambulatory Visit: Payer: Self-pay

## 2021-03-25 ENCOUNTER — Other Ambulatory Visit: Payer: Self-pay

## 2021-03-25 ENCOUNTER — Ambulatory Visit (INDEPENDENT_AMBULATORY_CARE_PROVIDER_SITE_OTHER): Payer: BC Managed Care – PPO | Admitting: Internal Medicine

## 2021-03-25 ENCOUNTER — Encounter: Payer: Self-pay | Admitting: Internal Medicine

## 2021-03-25 VITALS — BP 110/80 | HR 79 | Temp 97.9°F | Resp 16 | Ht 66.0 in | Wt 203.8 lb

## 2021-03-25 DIAGNOSIS — R053 Chronic cough: Secondary | ICD-10-CM

## 2021-03-25 DIAGNOSIS — U099 Post covid-19 condition, unspecified: Secondary | ICD-10-CM

## 2021-03-25 DIAGNOSIS — R0602 Shortness of breath: Secondary | ICD-10-CM | POA: Diagnosis not present

## 2021-03-25 DIAGNOSIS — J4520 Mild intermittent asthma, uncomplicated: Secondary | ICD-10-CM

## 2021-03-25 DIAGNOSIS — E669 Obesity, unspecified: Secondary | ICD-10-CM

## 2021-03-25 NOTE — Progress Notes (Signed)
[facility] [place], [place] [address]  Pulmonary Sleep Medicine   Office Visit Note  Patient Name: Danielle Wallace DOB: [birth year] MRN [id number]  Date of Service: 03/25/2021  Complaints/HPI: Post Covid. She still feels SOB with exertion sometimes at rest. Sitting down makes it better. Reviewed her Arlyce Harman and it is improving though slowly now up to 74%.  I reviewed the spirometry with her at length and explained to her that this is a slow process in recovery from the [place] in addition to that she does have a history of asthma so this also will complicate matters for her as far as being able to recover.  ROS  General: (-) fever, (-) chills, (-) night sweats, (-) weakness Skin: (-) rashes, (-) itching,. Eyes: (-) visual changes, (-) redness, (-) itching. Nose and Sinuses: (-) nasal stuffiness or itchiness, (-) postnasal drip, (-) nosebleeds, (-) sinus trouble. Mouth and Throat: (-) sore throat, (-) hoarseness. Neck: (-) swollen glands, (-) enlarged thyroid, (-) neck pain. Respiratory: + cough, (-) bloody sputum, + shortness of breath, - wheezing. Cardiovascular: - ankle swelling, (-) chest pain. Lymphatic: (-) lymph node enlargement. Neurologic: (-) numbness, (-) tingling. Psychiatric: (-) anxiety, (-) depression   Current Medication: Outpatient Encounter Medications as of 03/25/2021  Medication Sig   albuterol (VENTOLIN HFA) 108 (90 Base) MCG/ACT inhaler TAKE 2 PUFFS BY MOUTH EVERY 6 HOURS AS NEEDED FOR WHEEZE OR SHORTNESS OF BREATH   aspirin EC 81 MG tablet Take 1 tablet (81 mg total) by mouth daily. Swallow whole.   B COMPLEX-C PO Take by mouth daily.   Calcium Carb-Cholecalciferol (CALCIUM + D3) 600-200 MG-UNIT TABS Take 1 tablet by mouth 2 (two) times daily.   diphenhydrAMINE (BENADRYL) 25 MG tablet Take 25 mg by mouth every 6 (six) hours as needed.   EPINEPHrine (EPIPEN 2-PAK) 0.3 mg/0.3 mL IJ SOAJ injection Inject 0.3 mLs (0.3 mg total) into the  skin as needed.   etodolac (LODINE) 400 MG tablet Take 1 tablet (400 mg total) by mouth 2 (two) times daily as needed. for pain   fluticasone (FLONASE) 50 MCG/ACT nasal spray Place into both nostrils daily.   fluticasone furoate-vilanterol (BREO ELLIPTA) 100-25 MCG/ACT AEPB Inhale 1 puff into the lungs daily.   glucose blood (ONETOUCH VERIO) test strip Blood sugar testing done QD and as needed  E11.65   imipramine (TOFRANIL) 25 MG tablet Take 2 tablets (50 mg total) by mouth at bedtime.   insulin degludec (TRESIBA FLEXTOUCH) 100 UNIT/ML FlexTouch Pen Inject 40 Units into the skin daily. Take in the evening, raise dosage by 2 units every 3 days until blood sugar is 130 or below.   nadolol (CORGARD) 80 MG tablet Take 1 tablet (80 mg total) by mouth daily.   pantoprazole (PROTONIX) 40 MG tablet Take 1 tablet (40 mg total) by mouth daily.   pravastatin (PRAVACHOL) 20 MG tablet Take 1 tablet (20 mg total) by mouth daily.   Semaglutide, 1 MG/DOSE, (OZEMPIC, 1 MG/DOSE,) 2 MG/1.5ML SOPN Inject 1 mg into the skin once a week.   tamoxifen (NOLVADEX) 10 MG tablet Take 1 tablet (10 mg total) by mouth daily.   valsartan-hydrochlorothiazide (DIOVAN-HCT) 80-12.5 MG tablet Take 1 tablet by mouth daily.   [DISCONTINUED] Calcium Carbonate-Vitamin D 600-200 MG-UNIT TABS Calcium 600 + D(3) 600 mg (1,500 mg)-200 unit tablet  TAKE 1 TABLET BY MOUTH 2 (TWO) TIMES DAILY.   [DISCONTINUED] methylPREDNISolone (MEDROL DOSEPAK) 4 MG TBPK tablet Take 1 tablet by mouth as  directed. (Patient not taking: Reported on 03/25/2021)   No facility-administered encounter medications on file as of 03/25/2021.    Surgical History: Past Surgical History:  Procedure Laterality Date   BREAST BIOPSY Right 2005   negative   BREAST BIOPSY Left 08/23/2007   negative   BREAST BIOPSY Left 10/24/2013   positive   BREAST BIOPSY Left 07/2002   neg   BREAST BIOPSY Left 08/23/2007   neg   BREAST SURGERY Left 10/2011   Cyst Aspirationapocrine  metaplasia, ductal cells and bone cells, hypo-cellular   BREAST SURGERY Left 2009   ADH on stereotactic biopsy, 1.5 mm focus.   BREAST SURGERY Left 2003   fibrocystic changes with ductal hyperplasia without atypia.   BREAST SURGERY Left    mastectomy   BREAST SURGERY Left April 11, 2014   Removal of implant, debridement chest wall [place]   COLONOSCOPY  08/26/2013   Verdie Shire, M.D. normal.   CRYOABLATION  2005, 2010   CYST REMOVAL NECK  10/2011   Dr. Tami Ribas   ESOPHAGOGASTRODUODENOSCOPY (EGD) WITH PROPOFOL N/A 01/16/2017   Procedure: ESOPHAGOGASTRODUODENOSCOPY (EGD) WITH PROPOFOL;  Surgeon: Lucilla Lame, MD;  Location: [facility];  Service: Gastroenterology;  Laterality: N/A;  Diabetic - oral meds   INCISION AND DRAINAGE Left Dec 2015, Feb 2016   Dr Tula Nakayama   MASTECTOMY Left 11/24/2013   positive   PORTACATH PLACEMENT  11/24/13    Medical History: Past Medical History:  Diagnosis Date   Arthritis 2006   Asthma    Breast cancer ([place]) 10/14/2013   18 mm, T1c, N0; ER/ PR positive,her 2 neu overexpressed. Adjuvant chemo/ herceptin.   Cancer [facility]) 2006   Renal cell carcinoma; cryosurgery treatment, right side   Cirrhosis ([place])    Collapsed lung 2007   Diabetes mellitus without complication ([place]) 4854   Metformin   Diffuse cystic mastopathy    Family history of breast cancer    Family history of liver cancer    Motion sickness    any moving vehicle   Orthodontics    braces   Personal history of chemotherapy    Sinus problem     Family History: Family History  Problem Relation Age of Onset   Liver cancer Father    Hemochromatosis Father    Liver disease Father    Diabetes Mother    Hypertension Mother    Breast cancer Paternal Aunt 47   Ovarian cancer Maternal Grandmother        unk primary, metastatic cancer   Hemachromatosis Daughter    Liver cancer Paternal Uncle    Hemochromatosis Paternal Uncle     Social  History: Social History   Socioeconomic History   Marital status: Married    Spouse name: Not on file   Number of children: Not on file   Years of education: Not on file   Highest education level: Not on file  Occupational History   Not on file  Tobacco Use   Smoking status: Never   Smokeless tobacco: Never  Vaping Use   Vaping Use: Never used  Substance and Sexual Activity   Alcohol use: Not Currently    Comment: occasionally, none recently   Drug use: No   Sexual activity: Not on file  Other Topics Concern   Not on file  Social History Narrative   Not on file   Social Determinants of Health   Financial Resource Strain: Not on  file  Food Insecurity: Not on file  Transportation Needs: Not on file  Physical Activity: Not on file  Stress: Not on file  Social Connections: Not on file  Intimate Partner Violence: Not on file    Vital Signs: Blood pressure 110/80, pulse 79, temperature 97.9 F (36.6 C), resp. rate 16, height 5' 6"  (1.676 m), weight 203 lb 12.8 oz (92.4 kg), SpO2 95 %.  Examination: General Appearance: The patient is well-developed, well-nourished, and in no distress. Skin: Gross inspection of skin unremarkable. Head: normocephalic, no gross deformities. Eyes: no gross deformities noted. ENT: ears appear grossly normal no exudates. Neck: Supple. No thyromegaly. No LAD. Respiratory: no rhonchi noted. Cardiovascular: Normal S1 and S2 without murmur or rub. Extremities: No cyanosis. pulses are equal. Neurologic: Alert and oriented. No involuntary movements.  LABS: Recent Results (from the past 2160 hour(s))  POCT HgB A1C     Status: Abnormal   Collection Time: 01/24/21 10:30 AM  Result Value Ref Range   Hemoglobin A1C 6.3 (A) 4.0 - 5.6 %   HbA1c POC (<> result, manual entry)     HbA1c, POC (prediabetic range)     HbA1c, POC (controlled diabetic range)    AFP tumor marker     Status: None   Collection Time: 02/22/21  9:31 AM  Result Value Ref  Range   AFP, Serum, Tumor Marker 4.4 0.0 - 9.2 ng/mL    Comment: (NOTE) Roche Diagnostics Electrochemiluminescence Immunoassay (ECLIA) Values obtained with different assay methods or kits cannot be used interchangeably.  Results cannot be interpreted as absolute evidence of the presence or absence of malignant disease. This test is not interpretable in pregnant females. Performed At: [facility] [place], [place] [id number] Rush Farmer MD [id number]   Comprehensive metabolic panel     Status: Abnormal   Collection Time: 02/22/21  9:31 AM  Result Value Ref Range   Sodium 136 135 - 145 mmol/L   Potassium 4.4 3.5 - 5.1 mmol/L   Chloride 100 98 - 111 mmol/L   CO2 32 22 - 32 mmol/L   Glucose, Bld 122 (H) 70 - 99 mg/dL    Comment: Glucose reference range applies only to samples taken after fasting for at least 8 hours.   BUN 18 8 - 23 mg/dL   Creatinine, Ser 0.60 0.44 - 1.00 mg/dL   Calcium 8.8 (L) 8.9 - 10.3 mg/dL   Total Protein 6.8 6.5 - 8.1 g/dL   Albumin 3.5 3.5 - 5.0 g/dL   AST 40 15 - 41 U/L   ALT 37 0 - 44 U/L   Alkaline Phosphatase 86 38 - 126 U/L   Total Bilirubin 0.4 0.3 - 1.2 mg/dL   GFR, Estimated >60 >60 mL/min    Comment: (NOTE) Calculated using the CKD-EPI Creatinine Equation (2021)    Anion gap 4 (L) 5 - 15    Comment: Performed at [facility], [address] [place], [place] [address]  CBC with Differential     Status: None   Collection Time: 02/22/21  9:31 AM  Result Value Ref Range   WBC 7.1 4.0 - 10.5 K/uL   RBC 4.25 3.87 - 5.11 MIL/uL   Hemoglobin 13.3 12.0 - 15.0 g/dL   HCT 40.2 36.0 - 46.0 %   MCV 94.6 80.0 - 100.0 fL   MCH 31.3 26.0 - 34.0 pg   MCHC 33.1 30.0 - 36.0 g/dL   RDW 12.1 11.5 - 15.5 %   Platelets 212  150 - 400 K/uL   nRBC 0.0 0.0 - 0.2 %   Neutrophils Relative % 56 %   Neutro Abs 4.0 1.7 - 7.7 K/uL   Lymphocytes Relative 33 %   Lymphs Abs 2.3 0.7 - 4.0 K/uL   Monocytes Relative 8 %   Monocytes  Absolute 0.6 0.1 - 1.0 K/uL   Eosinophils Relative 2 %   Eosinophils Absolute 0.2 0.0 - 0.5 K/uL   Basophils Relative 1 %   Basophils Absolute 0.0 0.0 - 0.1 K/uL   Immature Granulocytes 0 %   Abs Immature Granulocytes 0.02 0.00 - 0.07 K/uL    Comment: Performed at [facility], [address], [place], [place] [address]    Radiology: US THYROID  Result Date: 03/15/2021 CLINICAL DATA:  Prior ultrasound follow-up. Follow-up thyroid nodules. EXAM: THYROID ULTRASOUND TECHNIQUE: Ultrasound examination of the thyroid gland and adjacent soft tissues was performed. COMPARISON:  None. FINDINGS: Parenchymal Echotexture: Mildly heterogenous Isthmus: Normal in size measuring 0.3 cm in diameter Right lobe: Normal in size measuring 4.4 x 1.3 x 1.6 cm Left lobe: Slightly atrophic measuring 3.8 x 1.0 x 1.7 cm _________________________________________________________ Estimated total number of nodules >/= 1 cm: 0 Number of spongiform nodules >/=  2 cm not described below (TR1): 0 Number of mixed cystic and solid nodules >/= 1.5 cm not described below (TR2): 0 _________________________________________________________ There is a punctate (approximately 0.4 cm) hypoechoic nodule within right lobe of the thyroid which does not meet criteria to recommend percutaneous sampling or continued dedicated follow-up There are several scattered punctate (sub 0.4 cm) anechoic cysts within the left lobe of the thyroid, the largest of which contains an internal echogenic foci with ring down artifact compatible with benign colloid. None of these punctate cysts meet imaging criteria to recommend percutaneous sampling or continued dedicated follow-up. IMPRESSION: 1. Slightly atrophic and mildly heterogeneous appearing thyroid without worrisome nodule or mass. Findings are nonspecific though could be seen in the setting of a chronic thyroiditis. 2. Punctate (sub 4 mm) bilateral hypoechoic nodules and anechoic cysts, none of which meet  imaging criteria to recommend percutaneous sampling or continued dedicated follow-up. The above is in keeping with the ACR TI-RADS recommendations - J Am Coll Radiol 2017;14:587-595. Electronically Signed   By: Sandi Mariscal M.D.   On: 03/15/2021 14:10    No results found.  US BREAST LTD UNI RIGHT INC AXILLA  Result Date: 03/04/2021 CLINICAL DATA:  Patient was recalled from screening mammogram for possible asymmetries in the right breast. EXAM: DIGITAL DIAGNOSTIC UNILATERAL RIGHT MAMMOGRAM WITH TOMOSYNTHESIS AND CAD; ULTRASOUND RIGHT BREAST LIMITED TECHNIQUE: Right digital diagnostic mammography and breast tomosynthesis was performed. The images were evaluated with computer-aided detection.; Targeted ultrasound examination of the right breast was performed COMPARISON:  Previous exam(s). ACR Breast Density Category c: The breast tissue is heterogeneously dense, which may obscure small masses. FINDINGS: Additional imaging of the right breast was performed. No persistent mass, distortion or malignant type microcalcifications identified. On physical exam, I do not palpate a mass in the upper-inner quadrant of the right breast. Targeted ultrasound is performed, showing normal tissue throughout the upper inner quadrant of the right breast. No solid or cystic mass, abnormal shadowing or distortion visualized. IMPRESSION: No evidence of malignancy in the right breast. RECOMMENDATION: Unilateral right screening mammogram in 1 year is recommended. I have discussed the findings and recommendations with the patient. If applicable, a reminder letter will be sent to the patient regarding the next appointment. BI-RADS CATEGORY  1:  Negative. Electronically Signed   By: Lillia Mountain M.D.   On: 03/04/2021 11:10  MM DIAG BREAST TOMO UNI RIGHT  Result Date: 03/04/2021 CLINICAL DATA:  Patient was recalled from screening mammogram for possible asymmetries in the right breast. EXAM: DIGITAL DIAGNOSTIC UNILATERAL RIGHT MAMMOGRAM  WITH TOMOSYNTHESIS AND CAD; ULTRASOUND RIGHT BREAST LIMITED TECHNIQUE: Right digital diagnostic mammography and breast tomosynthesis was performed. The images were evaluated with computer-aided detection.; Targeted ultrasound examination of the right breast was performed COMPARISON:  Previous exam(s). ACR Breast Density Category c: The breast tissue is heterogeneously dense, which may obscure small masses. FINDINGS: Additional imaging of the right breast was performed. No persistent mass, distortion or malignant type microcalcifications identified. On physical exam, I do not palpate a mass in the upper-inner quadrant of the right breast. Targeted ultrasound is performed, showing normal tissue throughout the upper inner quadrant of the right breast. No solid or cystic mass, abnormal shadowing or distortion visualized. IMPRESSION: No evidence of malignancy in the right breast. RECOMMENDATION: Unilateral right screening mammogram in 1 year is recommended. I have discussed the findings and recommendations with the patient. If applicable, a reminder letter will be sent to the patient regarding the next appointment. BI-RADS CATEGORY  1: Negative. Electronically Signed   By: Lillia Mountain M.D.   On: 03/04/2021 11:10  US THYROID  Result Date: 03/15/2021 CLINICAL DATA:  Prior ultrasound follow-up. Follow-up thyroid nodules. EXAM: THYROID ULTRASOUND TECHNIQUE: Ultrasound examination of the thyroid gland and adjacent soft tissues was performed. COMPARISON:  None. FINDINGS: Parenchymal Echotexture: Mildly heterogenous Isthmus: Normal in size measuring 0.3 cm in diameter Right lobe: Normal in size measuring 4.4 x 1.3 x 1.6 cm Left lobe: Slightly atrophic measuring 3.8 x 1.0 x 1.7 cm _________________________________________________________ Estimated total number of nodules >/= 1 cm: 0 Number of spongiform nodules >/=  2 cm not described below (TR1): 0 Number of mixed cystic and solid nodules >/= 1.5 cm not described below  (TR2): 0 _________________________________________________________ There is a punctate (approximately 0.4 cm) hypoechoic nodule within right lobe of the thyroid which does not meet criteria to recommend percutaneous sampling or continued dedicated follow-up There are several scattered punctate (sub 0.4 cm) anechoic cysts within the left lobe of the thyroid, the largest of which contains an internal echogenic foci with ring down artifact compatible with benign colloid. None of these punctate cysts meet imaging criteria to recommend percutaneous sampling or continued dedicated follow-up. IMPRESSION: 1. Slightly atrophic and mildly heterogeneous appearing thyroid without worrisome nodule or mass. Findings are nonspecific though could be seen in the setting of a chronic thyroiditis. 2. Punctate (sub 4 mm) bilateral hypoechoic nodules and anechoic cysts, none of which meet imaging criteria to recommend percutaneous sampling or continued dedicated follow-up. The above is in keeping with the ACR TI-RADS recommendations - J Am Coll Radiol 2017;14:587-595. Electronically Signed   By: Sandi Mariscal M.D.   On: 03/15/2021 14:10      Assessment and Plan: Patient Active Problem List   Diagnosis Date Noted   Complex tear of medial meniscus of right knee as current injury 03/04/2020   Aortic atherosclerosis ([place]) 03/04/2020   Osteoarthritis of knee 03/02/2020   Encounter for general adult medical examination with abnormal findings 01/31/2020   Primary osteoarthritis of both knees 01/31/2020   Degenerative disc disease, lumbar 01/31/2020   Genetic testing 11/16/2019   Family history of breast cancer    Family history of liver cancer    Multinodular goiter 10/10/2019   Thyromegaly 09/19/2019  Allergic reaction 07/01/2019   Breast cancer of upper-outer quadrant of left female breast ([place]) 10/27/2018   Routine cervical smear 10/19/2018   Uncontrolled type 2 diabetes mellitus with hyperglycemia ([place]) 10/19/2018    Gastroesophageal reflux disease with esophagitis 10/19/2018   Stomatitis and mucositis 10/19/2018   Urinary tract infection without hematuria 10/19/2018   Dysuria 10/19/2018   Need for vaccination against Streptococcus pneumoniae using pneumococcal conjugate vaccine 13 04/21/2018   Acute bronchitis 10/01/2017   Wheezing 10/01/2017   Sore throat 10/01/2017   Fatigue 09/23/2017   Vitamin D deficiency 09/23/2017   Anemia 09/23/2017   Acute non-recurrent pansinusitis 08/12/2017   Type 2 diabetes mellitus with hyperglycemia, without long-term current use of insulin ([place]) 08/12/2017   Epstein Barr virus infection 08/12/2017   Essential hypertension 05/18/2017   Abnormal CT scan, stomach    Gastric varices    Cellulitis of female breast 12/11/2016   Open wound of breast 12/11/2016   Abdominal distention 01/23/2016   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 12/12/2014   Nausea 09/22/2014   EN (erythema nodosum) 07/26/2014   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 SOB (shortness of breath) Combination of chronic obstructive asthma along with this she has recent COVID infection and so I mention it will take some time for her to recover - Spirometry with Graph  2. Chronic asthma, mild intermittent, uncomplicated On Bre[place] will be continued and use proair  3. Post-COVID chronic cough Slow to improve but her spiro numbers are improving so will continue to monitor  4. Obesity (BMI 30.0-34.9) Obesity Counseling: Had a lengthy  discussion regarding patients BMI and weight issues. Patient was instructed on portion control as well as increased activity. Also discussed caloric restrictions with trying to maintain intake less than 2000 Kcal. Discussions were made in accordance with the 5As of weight management. Simple actions such as not eating late and if able to, taking a walk is suggested.    General Counseling: I have discussed the findings of the evaluation and examination with Selinda Eon.  I have also discussed any further diagnostic evaluation thatmay be needed or ordered today. Amalea verbalizes understanding of the findings of todays visit. We also reviewed her medications today and discussed drug interactions and side effects including but not limited excessive drowsiness and altered mental states. We also discussed that there is always a risk not just to her but also people around her. she has been encouraged to call the office with any questions or concerns that should arise related to todays visit.  Orders Placed This Encounter  Procedures   Spirometry with Graph    Order Specific Question:   Where should this test be performed?    Answer:   [facility]    Order Specific Question:   Basic spirometry    Answer:   Yes    Order Specific Question:   Spirometry pre & post bronchodilator    Answer:   No     Time spent: 39  I have personally obtained a history, examined the patient, evaluated laboratory  and imaging results, formulated the assessment and plan and placed orders.    Allyne Gee, MD [facility] Pulmonary and Critical Care Sleep medicine

## 2021-03-25 NOTE — Patient Instructions (Signed)
Asthma, Adult Asthma is a long-term (chronic) condition in which the airways get tight and narrow. The airways are the breathing passages that lead from the nose and mouth down into the lungs. A person with asthma will have times when symptoms get worse. These are called asthma attacks. They can cause coughing, whistling sounds when you breathe (wheezing), shortness of breath, and chest pain. They can make it hard to breathe. There is no cure for asthma, but medicines and lifestyle changes can help control it. There are many things that can bring on an asthma attack or make asthma symptoms worse (triggers). Common triggers include: Mold. Dust. Cigarette smoke. Cockroaches. Things that can cause allergy symptoms (allergens). These include animal skin flakes (dander) and pollen from trees or grass. Things that pollute the air. These may include household cleaners, wood smoke, smog, or chemical odors. Cold air, weather changes, and wind. Crying or laughing hard. Stress. Certain medicines or drugs. Certain foods such as dried fruit, potato chips, and grape juice. Infections, such as a cold or the flu. Certain medical conditions or diseases. Exercise or tiring activities. Asthma may be treated with medicines and by staying away from the things that cause asthma attacks. Types of medicines may include: Controller medicines. These help prevent asthma symptoms. They are usually taken every day. Fast-acting reliever or rescue medicines. These quickly relieve asthma symptoms. They are used as needed and provide short-term relief. Allergy medicines if your attacks are brought on by allergens. Medicines to help control the body's defense (immune) system. Follow these instructions at home: Avoiding triggers in your home Change your heating and air conditioning filter often. Limit your use of fireplaces and wood stoves. Get rid of pests (such as roaches and mice) and their droppings. Throw away plants  if you see mold on them. Clean your floors. Dust regularly. Use cleaning products that do not smell. Have someone vacuum when you are not home. Use a vacuum cleaner with a HEPA filter if possible. Replace carpet with wood, tile, or vinyl flooring. Carpet can trap animal skin flakes and dust. Use allergy-proof pillows, mattress covers, and box spring covers. Wash bed sheets and blankets every week in hot water. Dry them in a dryer. Keep your bedroom free of any triggers. Avoid pets and keep windows closed when things that cause allergy symptoms are in the air. Use blankets that are made of polyester or cotton. Clean bathrooms and kitchens with bleach. If possible, have someone repaint the walls in these rooms with mold-resistant paint. Keep out of the rooms that are being cleaned and painted. Wash your hands often with soap and water. If soap and water are not available, use hand sanitizer. Do not allow anyone to smoke in your home. General instructions Take over-the-counter and prescription medicines only as told by your doctor. Talk with your doctor if you have questions about how or when to take your medicines. Make note if you need to use your medicines more often than usual. Do not use any products that contain nicotine or tobacco, such as cigarettes and e-cigarettes. If you need help quitting, ask your doctor. Stay away from secondhand smoke. Avoid doing things outdoors when allergen counts are high and when air quality is low. Wear a ski mask when doing outdoor activities in the winter. The mask should cover your nose and mouth. Exercise indoors on cold days if you can. Warm up before you exercise. Take time to cool down after exercise. Use a peak flow meter as  told by your doctor. A peak flow meter is a tool that measures how well the lungs are working. Keep track of the peak flow meter's readings. Write them down. Follow your asthma action plan. This is a written plan for taking care  of your asthma and treating your attacks. Make sure you get all the shots (vaccines) that your doctor recommends. Ask your doctor about a flu shot and a pneumonia shot. Keep all follow-up visits as told by your doctor. This is important. Contact a doctor if: You have wheezing, shortness of breath, or a cough even while taking medicine to prevent attacks. The mucus you cough up (sputum) is thicker than usual. The mucus you cough up changes from clear or white to yellow, green, gray, or bloody. You have problems from the medicine you are taking, such as: A rash. Itching. Swelling. Trouble breathing. You need reliever medicines more than 2-3 times a week. Your peak flow reading is still at 50-79% of your personal best after following the action plan for 1 hour. You have a fever. Get help right away if: You seem to be worse and are not responding to medicine during an asthma attack. You are short of breath even at rest. You get short of breath when doing very little activity. You have trouble eating, drinking, or talking. You have chest pain or tightness. You have a fast heartbeat. Your lips or fingernails start to turn blue. You are light-headed or dizzy, or you faint. Your peak flow is less than 50% of your personal best. You feel too tired to breathe normally. Summary Asthma is a long-term (chronic) condition in which the airways get tight and narrow. An asthma attack can make it hard to breathe. Asthma cannot be cured, but medicines and lifestyle changes can help control it. Make sure you understand how to avoid triggers and how and when to use your medicines. This information is not intended to replace advice given to you by your health care provider. Make sure you discuss any questions you have with your health care provider. Document Revised: 08/21/2019 Document Reviewed: 08/31/2019 Elsevier Patient Education  2022 [employer].

## 2021-04-10 ENCOUNTER — Ambulatory Visit (INDEPENDENT_AMBULATORY_CARE_PROVIDER_SITE_OTHER): Payer: BC Managed Care – PPO

## 2021-04-10 ENCOUNTER — Other Ambulatory Visit: Payer: Self-pay

## 2021-04-10 DIAGNOSIS — I2510 Atherosclerotic heart disease of native coronary artery without angina pectoris: Secondary | ICD-10-CM

## 2021-04-10 LAB — ECHOCARDIOGRAM COMPLETE
AR max vel: 3.18 cm2
AV Area VTI: 3.11 cm2
AV Area mean vel: 3.38 cm2
AV Mean grad: 3 mmHg
AV Peak grad: 5.2 mmHg
Ao pk vel: 1.14 m/s
Area-P 1/2: 4.86 cm2
Calc EF: 50.8 %
S' Lateral: 3.8 cm
Single Plane A2C EF: 51.2 %
Single Plane A4C EF: 50.4 %

## 2021-04-26 ENCOUNTER — Ambulatory Visit (INDEPENDENT_AMBULATORY_CARE_PROVIDER_SITE_OTHER): Payer: BC Managed Care – PPO | Admitting: Physician Assistant

## 2021-04-26 ENCOUNTER — Telehealth: Payer: Self-pay

## 2021-04-26 ENCOUNTER — Encounter: Payer: Self-pay | Admitting: Physician Assistant

## 2021-04-26 ENCOUNTER — Other Ambulatory Visit: Payer: Self-pay

## 2021-04-26 VITALS — BP 124/76 | HR 85 | Temp 97.9°F | Resp 16 | Ht 66.0 in | Wt 202.6 lb

## 2021-04-26 DIAGNOSIS — I700 Atherosclerosis of aorta: Secondary | ICD-10-CM

## 2021-04-26 DIAGNOSIS — E041 Nontoxic single thyroid nodule: Secondary | ICD-10-CM | POA: Diagnosis not present

## 2021-04-26 DIAGNOSIS — I10 Essential (primary) hypertension: Secondary | ICD-10-CM

## 2021-04-26 DIAGNOSIS — R490 Dysphonia: Secondary | ICD-10-CM

## 2021-04-26 DIAGNOSIS — Z794 Long term (current) use of insulin: Secondary | ICD-10-CM

## 2021-04-26 DIAGNOSIS — E1165 Type 2 diabetes mellitus with hyperglycemia: Secondary | ICD-10-CM | POA: Diagnosis not present

## 2021-04-26 DIAGNOSIS — E0789 Other specified disorders of thyroid: Secondary | ICD-10-CM | POA: Diagnosis not present

## 2021-04-26 LAB — POCT GLYCOSYLATED HEMOGLOBIN (HGB A1C): Hemoglobin A1C: 6.5 % — AB (ref 4.0–5.6)

## 2021-04-26 MED ORDER — TRESIBA FLEXTOUCH 100 UNIT/ML ~~LOC~~ SOPN: 40.0000 [IU] | PEN_INJECTOR | Freq: Every day | SUBCUTANEOUS | 1 refills | Status: DC

## 2021-04-26 MED ORDER — PRAVASTATIN SODIUM 20 MG PO TABS: 20.0000 mg | ORAL_TABLET | Freq: Every day | ORAL | 1 refills | Status: DC

## 2021-04-26 NOTE — Progress Notes (Signed)
[facility], [place] [address]  Internal MEDICINE  Office Visit Note  Patient Name: Danielle Wallace  [id number]  [id number]  Date of Service: 05/06/2021  Chief Complaint  Patient presents with   Diabetes    HPI Pt is here for routine follow up -BG in Am 100-130, one higher reading. She has been taking 40units tresiba -PT for low back now and arthritis flaring up -Cardiology visit: had an EKG and echo. Some T wave changes and maybe blockages. Will continue to follow up -Thyroid US following nodules-didn't recommend follow up but she feels the area is tender, and sounds hoarse. Would like to investigate further and will be referred to ENT based on the pain  Current Medication: Outpatient Encounter Medications as of 04/26/2021  Medication Sig   albuterol (VENTOLIN HFA) 108 (90 Base) MCG/ACT inhaler TAKE 2 PUFFS BY MOUTH EVERY 6 HOURS AS NEEDED FOR WHEEZE OR SHORTNESS OF BREATH   aspirin EC 81 MG tablet Take 1 tablet (81 mg total) by mouth daily. Swallow whole.   B COMPLEX-C PO Take by mouth daily.   Calcium Carb-Cholecalciferol (CALCIUM + D3) 600-200 MG-UNIT TABS Take 1 tablet by mouth 2 (two) times daily.   diphenhydrAMINE (BENADRYL) 25 MG tablet Take 25 mg by mouth every 6 (six) hours as needed.   EPINEPHrine (EPIPEN 2-PAK) 0.3 mg/0.3 mL IJ SOAJ injection Inject 0.3 mLs (0.3 mg total) into the skin as needed.   etodolac (LODINE) 400 MG tablet Take 1 tablet (400 mg total) by mouth 2 (two) times daily as needed. for pain   fluticasone (FLONASE) 50 MCG/ACT nasal spray Place into both nostrils daily.   fluticasone furoate-vilanterol (BREO ELLIPTA) 100-25 MCG/ACT AEPB Inhale 1 puff into the lungs daily.   glucose blood (ONETOUCH VERIO) test strip Blood sugar testing done QD and as needed  E11.65   imipramine (TOFRANIL) 25 MG tablet Take 2 tablets (50 mg total) by mouth at bedtime.   nadolol (CORGARD) 80 MG tablet Take 1 tablet (80 mg total) by mouth  daily.   pantoprazole (PROTONIX) 40 MG tablet Take 1 tablet (40 mg total) by mouth daily.   Semaglutide, 1 MG/DOSE, (OZEMPIC, 1 MG/DOSE,) 2 MG/1.5ML SOPN Inject 1 mg into the skin once a week.   tamoxifen (NOLVADEX) 10 MG tablet Take 1 tablet (10 mg total) by mouth daily.   valsartan-hydrochlorothiazide (DIOVAN-HCT) 80-12.5 MG tablet Take 1 tablet by mouth daily.   [DISCONTINUED] insulin degludec (TRESIBA FLEXTOUCH) 100 UNIT/ML FlexTouch Pen Inject 40 Units into the skin daily. Take in the evening, raise dosage by 2 units every 3 days until blood sugar is 130 or below.   [DISCONTINUED] pravastatin (PRAVACHOL) 20 MG tablet Take 1 tablet (20 mg total) by mouth daily.   insulin degludec (TRESIBA FLEXTOUCH) 100 UNIT/ML FlexTouch Pen Inject 40 Units into the skin daily. Take in the evening, raise dosage by 2 units every 3 days until blood sugar is 130 or below.   pravastatin (PRAVACHOL) 20 MG tablet Take 1 tablet (20 mg total) by mouth daily.   No facility-administered encounter medications on file as of 04/26/2021.    Surgical History: Past Surgical History:  Procedure Laterality Date   BREAST BIOPSY Right 2005   negative   BREAST BIOPSY Left 08/23/2007   negative   BREAST BIOPSY Left 10/24/2013   positive   BREAST BIOPSY Left 07/2002   neg   BREAST BIOPSY Left 08/23/2007   neg   BREAST SURGERY Left 10/2011  Cyst Aspirationapocrine metaplasia, ductal cells and bone cells, hypo-cellular   BREAST SURGERY Left 2009   ADH on stereotactic biopsy, 1.5 mm focus.   BREAST SURGERY Left 2003   fibrocystic changes with ductal hyperplasia without atypia.   BREAST SURGERY Left    mastectomy   BREAST SURGERY Left April 11, 2014   Removal of implant, debridement chest wall [place]   COLONOSCOPY  08/26/2013   Verdie Shire, M.D. normal.   CRYOABLATION  2005, 2010   CYST REMOVAL NECK  10/2011   Dr. Tami Ribas   ESOPHAGOGASTRODUODENOSCOPY (EGD) WITH PROPOFOL  N/A 01/16/2017   Procedure: ESOPHAGOGASTRODUODENOSCOPY (EGD) WITH PROPOFOL;  Surgeon: Lucilla Lame, MD;  Location: [facility];  Service: Gastroenterology;  Laterality: N/A;  Diabetic - oral meds   INCISION AND DRAINAGE Left Dec 2015, Feb 2016   Dr Tula Nakayama   MASTECTOMY Left 11/24/2013   positive   PORTACATH PLACEMENT  11/24/13    Medical History: Past Medical History:  Diagnosis Date   Arthritis 2006   Asthma    Breast cancer ([place]) 10/14/2013   18 mm, T1c, N0; ER/ PR positive,her 2 neu overexpressed. Adjuvant chemo/ herceptin.   Cancer [facility]) 2006   Renal cell carcinoma; cryosurgery treatment, right side   Cirrhosis ([place])    Collapsed lung 2007   Diabetes mellitus without complication ([place]) 0865   Metformin   Diffuse cystic mastopathy    Family history of breast cancer    Family history of liver cancer    Motion sickness    any moving vehicle   Orthodontics    braces   Personal history of chemotherapy    Sinus problem     Family History: Family History  Problem Relation Age of Onset   Liver cancer Father    Hemochromatosis Father    Liver disease Father    Diabetes Mother    Hypertension Mother    Breast cancer Paternal Aunt 60   Ovarian cancer Maternal Grandmother        unk primary, metastatic cancer   Hemachromatosis Daughter    Liver cancer Paternal Uncle    Hemochromatosis Paternal Uncle     Social History   Socioeconomic History   Marital status: Married    Spouse name: Not on file   Number of children: Not on file   Years of education: Not on file   Highest education level: Not on file  Occupational History   Not on file  Tobacco Use   Smoking status: Never   Smokeless tobacco: Never  Vaping Use   Vaping Use: Never used  Substance and Sexual Activity   Alcohol use: Not Currently    Comment: occasionally, none recently   Drug use: No   Sexual activity: Not on file  Other Topics Concern   Not on file  Social History Narrative   Not on file    Social Determinants of Health   Financial Resource Strain: Not on file  Food Insecurity: Not on file  Transportation Needs: Not on file  Physical Activity: Not on file  Stress: Not on file  Social Connections: Not on file  Intimate Partner Violence: Not on file      Review of Systems  Constitutional:  Negative for chills, fatigue and unexpected weight change.  HENT:  Positive for voice change. Negative for congestion, postnasal drip, rhinorrhea, sneezing and sore throat.        Tenderness along neck over thyroid  Eyes:  Negative for redness.  Respiratory:  Negative for cough, chest tightness, shortness of breath and wheezing.   Cardiovascular:  Negative for chest pain and palpitations.  Gastrointestinal:  Negative for abdominal pain, constipation, diarrhea, nausea and vomiting.  Genitourinary:  Negative for dysuria and frequency.  Musculoskeletal:  Negative for arthralgias, back pain, joint swelling and neck pain.  Skin:  Negative for rash.  Neurological: Negative.  Negative for tremors and numbness.  Hematological:  Negative for adenopathy. Does not bruise/bleed easily.  Psychiatric/Behavioral:  Negative for behavioral problems (Depression), sleep disturbance and suicidal ideas. The patient is not nervous/anxious.    Vital Signs: BP 124/76    Pulse 85    Temp 97.9 F (36.6 C)    Resp 16    Ht 5' 6"  (1.676 m)    Wt 202 lb 9.6 oz (91.9 kg)    SpO2 97%    BMI 32.70 kg/m    Physical Exam Vitals and nursing note reviewed.  Constitutional:      General: She is not in acute distress.    Appearance: She is well-developed. She is obese. She is not diaphoretic.  HENT:     Head: Normocephalic and atraumatic.     Mouth/Throat:     Pharynx: No oropharyngeal exudate.     Comments: Tenderness to palpation along front of neck over thyroid Eyes:     Pupils: Pupils are equal, round, and reactive to light.  Neck:     Thyroid: No thyromegaly.     Vascular: No JVD.     Trachea: No  tracheal deviation.  Cardiovascular:     Rate and Rhythm: Normal rate and regular rhythm.     Heart sounds: Normal heart sounds. No murmur heard.   No friction rub. No gallop.  Pulmonary:     Effort: Pulmonary effort is normal. No respiratory distress.     Breath sounds: No wheezing or rales.  Chest:     Chest wall: No tenderness.  Abdominal:     General: Bowel sounds are normal.     Palpations: Abdomen is soft.  Musculoskeletal:        General: Normal range of motion.     Cervical back: Normal range of motion and neck supple.     Right lower leg: No edema.     Left lower leg: No edema.  Lymphadenopathy:     Cervical: No cervical adenopathy.  Skin:    General: Skin is warm and dry.  Neurological:     Mental Status: She is alert and oriented to person, place, and time.     Cranial Nerves: No cranial nerve deficit.  Psychiatric:        Behavior: Behavior normal.        Thought Content: Thought content normal.        Judgment: Judgment normal.       Assessment/Plan: 1. Type 2 diabetes mellitus with hyperglycemia, with long-term current use of insulin (HCC) - POCT glycosylated hemoglobin (Hb A1C) is 6.5 which is slightly increased from 6.3 last visit. Will continue current medications and monitoring and work to improve diet and exercise  2. Essential hypertension Stable, continue current medication  3. Thyroid nodule Though small on [place], due to symptoms will refer to ENT - Ambulatory referral to ENT  4. Thyroid pain Will refer to ENT - Ambulatory referral to ENT  5. Hoarseness Will refer to ENT due to hoarseness, nodules, and thyroid pain - Ambulatory referral to ENT  6. Aortic atherosclerosis (  [place]) Continue pravastatin - pravastatin (PRAVACHOL) 20 MG tablet; Take 1 tablet (20 mg total) by mouth daily.  Dispense: 90 tablet; Refill: 1   General Counseling: Danielle Wallace verbalizes understanding of the findings of todays visit and agrees with plan of treatment. I have  discussed any further diagnostic evaluation that may be needed or ordered today. We also reviewed her medications today. she has been encouraged to call the office with any questions or concerns that should arise related to todays visit.    Orders Placed This Encounter  Procedures   Ambulatory referral to ENT   POCT glycosylated hemoglobin (Hb A1C)    Meds ordered this encounter  Medications   pravastatin (PRAVACHOL) 20 MG tablet    Sig: Take 1 tablet (20 mg total) by mouth daily.    Dispense:  90 tablet    Refill:  1   insulin degludec (TRESIBA FLEXTOUCH) 100 UNIT/ML FlexTouch Pen    Sig: Inject 40 Units into the skin daily. Take in the evening, raise dosage by 2 units every 3 days until blood sugar is 130 or below.    Dispense:  9 mL    Refill:  1    This patient was seen by Drema Dallas, PA-C in collaboration with Dr. Clayborn Bigness as a part of collaborative care agreement.   Total time spent:30 Minutes Time spent includes review of chart, medications, test results, and follow up plan with the patient.      Dr Lavera Guise Internal medicine

## 2021-04-26 NOTE — Telephone Encounter (Signed)
Awaiting 04/26/21 office notes for referral-Toni

## 2021-05-07 NOTE — Telephone Encounter (Signed)
Ent referral sent via [employer] to [facility]-Toni

## 2021-05-08 NOTE — Telephone Encounter (Signed)
Ent appointment scheduled for 06/17/21 @ 9:40-Toni

## 2021-05-10 ENCOUNTER — Encounter: Payer: BC Managed Care – PPO | Admitting: Physician Assistant

## 2021-05-14 ENCOUNTER — Telehealth: Payer: Self-pay

## 2021-05-14 NOTE — Telephone Encounter (Signed)
Sent PA for [facility] to [email or web address] along with notes from recent visit 05/14/21 at 142pm

## 2021-05-16 ENCOUNTER — Telehealth: Payer: Self-pay

## 2021-05-16 DIAGNOSIS — E1165 Type 2 diabetes mellitus with hyperglycemia: Secondary | ICD-10-CM

## 2021-05-16 MED ORDER — OZEMPIC (1 MG/DOSE) 2 MG/1.5ML ~~LOC~~ SOPN: 1.0000 mg | PEN_INJECTOR | SUBCUTANEOUS | 2 refills | Status: DC

## 2021-05-16 NOTE — Telephone Encounter (Signed)
PA for Ozempic 1 MG DOSE WAS APPROVED 05/14/21.  Sent new rx to pharmacy and notified pt

## 2021-05-30 ENCOUNTER — Telehealth: Payer: BC Managed Care – PPO | Admitting: Nurse Practitioner

## 2021-05-30 DIAGNOSIS — J0140 Acute pansinusitis, unspecified: Secondary | ICD-10-CM | POA: Diagnosis not present

## 2021-05-30 DIAGNOSIS — J069 Acute upper respiratory infection, unspecified: Secondary | ICD-10-CM

## 2021-05-30 MED ORDER — DOXYCYCLINE HYCLATE 100 MG PO TABS: 100.0000 mg | ORAL_TABLET | Freq: Two times a day (BID) | ORAL | 0 refills | Status: AC

## 2021-05-30 NOTE — Progress Notes (Signed)
E-Visit for Sinus Problems  We are sorry that you are not feeling well.  Here is how we plan to help!  Based on what you have shared with me it looks like you have sinusitis.  Sinusitis is inflammation and infection in the sinus cavities of the head.  Based on your presentation I believe you most likely have Acute Viral Sinusitis.This is an infection most likely caused by a virus. There is not specific treatment for viral sinusitis other than to help you with the symptoms until the infection runs its course.  You may use an oral decongestant such as Mucinex D or if you have glaucoma or high blood pressure use plain Mucinex. Saline nasal spray help and can safely be used as often as needed for congestion.   Providers prescribe antibiotics to treat infections caused by bacteria. Antibiotics are very powerful in treating bacterial infections when they are used properly. To maintain their effectiveness, they should be used only when necessary. Overuse of antibiotics has resulted in the development of superbugs that are resistant to treatment!    After careful review of your answers, I would not recommend an antibiotic for your condition.  Antibiotics are not effective against viruses and therefore should not be used to treat them. Common examples of infections caused by viruses include colds and flu   We typically do not start antibiotic prior to 7+ days of symptoms. Especially due to your allergy list we want to avoid early antibiotic use so that if symptoms do persist we know that we have an antibiotic to use that will work and that you can take safely.   You are using the correct over the counter medications and should continue that regimen, and follow up if symptoms persist for one week or longer.   Some authorities believe that zinc sprays or the use of Echinacea may shorten the course of your symptoms.  Sinus infections are not as easily transmitted as other respiratory infection, however we still  recommend that you avoid close contact with loved ones, especially the very young and elderly.  Remember to wash your hands thoroughly throughout the day as this is the number one way to prevent the spread of infection!  Home Care: Only take medications as instructed by your medical team. Do not take these medications with alcohol. A steam or ultrasonic humidifier can help congestion.  You can place a towel over your head and breathe in the steam from hot water coming from a faucet. Avoid close contacts especially the very young and the elderly. Cover your mouth when you cough or sneeze. Always remember to wash your hands.  Get Help Right Away If: You develop worsening fever or sinus pain. You develop a severe head ache or visual changes. Your symptoms persist after you have completed your treatment plan.  Make sure you Understand these instructions. Will watch your condition. Will get help right away if you are not doing well or get worse.   Thank you for choosing an e-visit.  Your e-visit answers were reviewed by a board certified advanced clinical practitioner to complete your personal care plan. Depending upon the condition, your plan could have included both over the counter or prescription medications.  Please review your pharmacy choice. Make sure the pharmacy is open so you can pick up prescription now. If there is a problem, you may contact your provider through [employer] and have the prescription routed to another pharmacy.  Your safety is important to [place]. If  you have drug allergies check your prescription carefully.   For the next 24 hours you can use MyChart to ask questions about today's visit, request a non-urgent call back, or ask for a work or school excuse. You will get an email in the next two days asking about your experience. I hope that your e-visit has been valuable and will speed your recovery.

## 2021-05-30 NOTE — Addendum Note (Signed)
Addended by: Apolonio Schneiders E on: 05/30/2021 07:25 PM   Modules accepted: Orders

## 2021-05-30 NOTE — Progress Notes (Signed)
No problem here is the change in plan:  We are sorry that you are not feeling well.  Here is how we plan to help!  Based on what you have shared with me it looks like you have sinusitis.  Sinusitis is inflammation and infection in the sinus cavities of the head.  Based on your presentation I believe you most likely have Acute Bacterial Sinusitis.  This is an infection caused by bacteria and is treated with antibiotics. I have prescribed Doxycycline 181m by mouth twice a day for 10 days. You may use an oral decongestant such as Mucinex D or if you have glaucoma or high blood pressure use plain Mucinex. Saline nasal spray help and can safely be used as often as needed for congestion.  If you develop worsening sinus pain, fever or notice severe headache and vision changes, or if symptoms are not better after completion of antibiotic, please schedule an appointment with a health care provider.    Sinus infections are not as easily transmitted as other respiratory infection, however we still recommend that you avoid close contact with loved ones, especially the very young and elderly.  Remember to wash your hands thoroughly throughout the day as this is the number one way to prevent the spread of infection!  Home Care: Only take medications as instructed by your medical team. Complete the entire course of an antibiotic. Do not take these medications with alcohol. A steam or ultrasonic humidifier can help congestion.  You can place a towel over your head and breathe in the steam from hot water coming from a faucet. Avoid close contacts especially the very young and the elderly. Cover your mouth when you cough or sneeze. Always remember to wash your hands.  Get Help Right Away If: You develop worsening fever or sinus pain. You develop a severe head ache or visual changes. Your symptoms persist after you have completed your treatment plan.  Make sure you Understand these instructions. Will watch  your condition. Will get help right away if you are not doing well or get worse.  Thank you for choosing an e-visit.  Your e-visit answers were reviewed by a board certified advanced clinical practitioner to complete your personal care plan. Depending upon the condition, your plan could have included both over the counter or prescription medications.  Please review your pharmacy choice. Make sure the pharmacy is open so you can pick up prescription now. If there is a problem, you may contact your provider through [employer] have the prescription routed to another pharmacy.  Your safety is important to [place] If you have drug allergies check your prescription carefully.   For the next 24 hours you can use MyChart to ask questions about today's visit, request a non-urgent call back, or ask for a work or school excuse. You will get an email in the next two days asking about your experience. I hope that your e-visit has been valuable and will speed your recovery.   Meds ordered this encounter  Medications   doxycycline (VIBRA-TABS) 100 MG tablet    Sig: Take 1 tablet (100 mg total) by mouth 2 (two) times daily for 10 days.    Dispense:  20 tablet    Refill:  0     I spent approximately 7 minutes reviewing the patient's history, current symptoms and coordinating their plan of care today.

## 2021-06-03 ENCOUNTER — Encounter: Payer: Self-pay | Admitting: Internal Medicine

## 2021-06-05 ENCOUNTER — Other Ambulatory Visit: Payer: Self-pay

## 2021-06-05 MED ORDER — TRESIBA FLEXTOUCH 100 UNIT/ML ~~LOC~~ SOPN: 40.0000 [IU] | PEN_INJECTOR | Freq: Every day | SUBCUTANEOUS | 1 refills | Status: DC

## 2021-06-17 DIAGNOSIS — E041 Nontoxic single thyroid nodule: Secondary | ICD-10-CM | POA: Diagnosis not present

## 2021-06-17 DIAGNOSIS — R490 Dysphonia: Secondary | ICD-10-CM | POA: Diagnosis not present

## 2021-07-11 ENCOUNTER — Other Ambulatory Visit: Payer: Self-pay

## 2021-07-11 ENCOUNTER — Ambulatory Visit (INDEPENDENT_AMBULATORY_CARE_PROVIDER_SITE_OTHER): Payer: BC Managed Care – PPO | Admitting: Physician Assistant

## 2021-07-11 ENCOUNTER — Encounter: Payer: Self-pay | Admitting: Physician Assistant

## 2021-07-11 VITALS — BP 126/76 | HR 85 | Temp 98.1°F | Resp 16 | Ht 66.0 in | Wt 206.2 lb

## 2021-07-11 DIAGNOSIS — J440 Chronic obstructive pulmonary disease with acute lower respiratory infection: Secondary | ICD-10-CM | POA: Diagnosis not present

## 2021-07-11 DIAGNOSIS — J209 Acute bronchitis, unspecified: Secondary | ICD-10-CM

## 2021-07-11 MED ORDER — PREDNISONE 10 MG PO TABS: ORAL_TABLET | ORAL | 0 refills | Status: DC

## 2021-07-11 MED ORDER — FLUTICASONE PROPIONATE 50 MCG/ACT NA SUSP: 1.0000 | Freq: Every day | NASAL | 3 refills | Status: DC

## 2021-07-11 MED ORDER — DOXYCYCLINE HYCLATE 100 MG PO TABS: 100.0000 mg | ORAL_TABLET | Freq: Two times a day (BID) | ORAL | 0 refills | Status: DC

## 2021-07-11 MED ORDER — BENZONATATE 100 MG PO CAPS: 100.0000 mg | ORAL_CAPSULE | Freq: Two times a day (BID) | ORAL | 0 refills | Status: DC | PRN

## 2021-07-11 NOTE — Progress Notes (Signed)
?[facility] ?[address] ?[place], [place] [address] ? ?Internal MEDICINE  ?Office Visit Note ? ?Patient Name: Danielle Wallace ? [id number]  ?[id number] ? ?Date of Service: 07/17/2021 ? ?Chief Complaint  ?Patient presents with  ? Acute Visit  ?  light headed, neg covid test 2 weeks ago  ? Cough  ?  For 3 weeks   ? Sore Throat  ? Fatigue  ? ? ? ?HPI ?Pt is here for a sick visit. ?-Has been having symptoms for over a month now ?-Has done the nebulizer at home  ?-had an evisit in Jan and was given ABX which initially helped but then symptoms started back again and have continued since ?-She is having significant cough with some wheezing and shortness of breath.  She is also having sore throat and congestion and feels like she is having some chest tightness.  She additionally is feeling very fatigued as cough keeps her up at night. ?- She did take a COVID test and this was negative ? ?Current Medication: ? ?Outpatient Encounter Medications as of 07/11/2021  ?Medication Sig  ? albuterol (VENTOLIN HFA) 108 (90 Base) MCG/ACT inhaler TAKE 2 PUFFS BY MOUTH EVERY 6 HOURS AS NEEDED FOR WHEEZE OR SHORTNESS OF BREATH  ? aspirin EC 81 MG tablet Take 1 tablet (81 mg total) by mouth daily. Swallow whole.  ? B COMPLEX-C PO Take by mouth daily.  ? benzonatate (TESSALON) 100 MG capsule Take 1 capsule (100 mg total) by mouth 2 (two) times daily as needed for cough.  ? Calcium Carb-Cholecalciferol (CALCIUM + D3) 600-200 MG-UNIT TABS Take 1 tablet by mouth 2 (two) times daily.  ? diphenhydrAMINE (BENADRYL) 25 MG tablet Take 25 mg by mouth every 6 (six) hours as needed.  ? doxycycline (VIBRA-TABS) 100 MG tablet Take 1 tablet (100 mg total) by mouth 2 (two) times daily.  ? EPINEPHrine (EPIPEN 2-PAK) 0.3 mg/0.3 mL IJ SOAJ injection Inject 0.3 mLs (0.3 mg total) into the skin as needed.  ? etodolac (LODINE) 400 MG tablet Take 1 tablet (400 mg total) by mouth 2 (two) times daily as needed. for pain  ? fluticasone furoate-vilanterol  (BREO ELLIPTA) 100-25 MCG/ACT AEPB Inhale 1 puff into the lungs daily.  ? glucose blood (ONETOUCH VERIO) test strip Blood sugar testing done QD and as needed  E11.65  ? imipramine (TOFRANIL) 25 MG tablet Take 2 tablets (50 mg total) by mouth at bedtime.  ? insulin degludec (TRESIBA FLEXTOUCH) 100 UNIT/ML FlexTouch Pen Inject 40 Units into the skin daily. Take in the evening, raise dosage by 2 units every 3 days until blood sugar is 130 or below.  ? nadolol (CORGARD) 80 MG tablet Take 1 tablet (80 mg total) by mouth daily.  ? pantoprazole (PROTONIX) 40 MG tablet Take 1 tablet (40 mg total) by mouth daily.  ? pravastatin (PRAVACHOL) 20 MG tablet Take 1 tablet (20 mg total) by mouth daily.  ? predniSONE (DELTASONE) 10 MG tablet Use per dose pack  ? Semaglutide, 1 MG/DOSE, (OZEMPIC, 1 MG/DOSE,) 2 MG/1.5ML SOPN Inject 1 mg into the skin once a week.  ? tamoxifen (NOLVADEX) 10 MG tablet Take 1 tablet (10 mg total) by mouth daily.  ? valsartan-hydrochlorothiazide (DIOVAN-HCT) 80-12.5 MG tablet Take 1 tablet by mouth daily.  ? [DISCONTINUED] fluticasone (FLONASE) 50 MCG/ACT nasal spray Place into both nostrils daily.  ? fluticasone (FLONASE) 50 MCG/ACT nasal spray Place 1 spray into both nostrils daily.  ? ?No facility-administered encounter medications on file as  of 07/11/2021.  ? ? ? ? ?Medical History: ?Past Medical History:  ?Diagnosis Date  ? Arthritis 2006  ? Asthma   ? Breast cancer ([facility]) 10/14/2013  ? 18 mm, T1c, N0; ER/ PR positive,her 2 neu overexpressed. Adjuvant chemo/ herceptin.  ? Cancer [facility]) 2006  ? Renal cell carcinoma; cryosurgery treatment, right side  ? Cirrhosis ([place])   ? Collapsed lung 2007  ? Diabetes mellitus without complication ([facility]) 9449  ? Metformin  ? Diffuse cystic mastopathy   ? Family history of breast cancer   ? Family history of liver cancer   ? Motion sickness   ? any moving vehicle  ? Orthodontics   ? braces  ? Personal history of chemotherapy   ? Sinus problem   ? ? ? ?Vital Signs: ?BP  126/76   Pulse 85   Temp 98.1 ?F (36.7 ?C)   Resp 16   Ht 5' 6"  (1.676 m)   Wt 206 lb 3.2 oz (93.5 kg)   SpO2 98%   BMI 33.28 kg/m?  ? ? ?Review of Systems  ?Constitutional:  Positive for fatigue. Negative for fever.  ?HENT:  Positive for congestion, postnasal drip and sore throat. Negative for mouth sores.   ?Respiratory:  Positive for cough, chest tightness, shortness of breath and wheezing.   ?Cardiovascular:  Negative for chest pain.  ?Genitourinary:  Negative for flank pain.  ?Psychiatric/Behavioral: Negative.    ? ?Physical Exam ?Vitals and nursing note reviewed.  ?Constitutional:   ?   General: She is not in acute distress. ?   Appearance: She is well-developed. She is not diaphoretic.  ?HENT:  ?   Head: Normocephalic and atraumatic.  ?   Nose: Congestion present.  ?   Mouth/Throat:  ?   Pharynx: Posterior oropharyngeal erythema present. No oropharyngeal exudate.  ?Eyes:  ?   Pupils: Pupils are equal, round, and reactive to light.  ?Neck:  ?   Thyroid: No thyromegaly.  ?   Vascular: No JVD.  ?   Trachea: No tracheal deviation.  ?Cardiovascular:  ?   Rate and Rhythm: Normal rate and regular rhythm.  ?   Heart sounds: Normal heart sounds. No murmur heard. ?  No friction rub. No gallop.  ?Pulmonary:  ?   Effort: Pulmonary effort is normal. No respiratory distress.  ?   Breath sounds: Wheezing present. No rales.  ?Chest:  ?   Chest wall: No tenderness.  ?Abdominal:  ?   General: Bowel sounds are normal.  ?   Palpations: Abdomen is soft.  ?Musculoskeletal:     ?   General: Normal range of motion.  ?   Cervical back: Normal range of motion and neck supple.  ?Lymphadenopathy:  ?   Cervical: No cervical adenopathy.  ?Skin: ?   General: Skin is warm and dry.  ?Neurological:  ?   Mental Status: She is alert and oriented to person, place, and time.  ?   Cranial Nerves: No cranial nerve deficit.  ?Psychiatric:     ?   Behavior: Behavior normal.     ?   Thought Content: Thought content normal.     ?   Judgment:  Judgment normal.  ? ? ? ? ?Assessment/Plan: ?1. Acute bronchitis with COPD ([place]) ?Go ahead and start on another round of antibiotics.  Based on patient's allergies we will go ahead and start on doxycycline twice per day for 10 days as well as start on a steroid Dosepak.  Tessalon also given to  help with cough and hopefully increase ability to sleep.  Patient may continue to use inhaler and nebulizer as indicated as well as Flonase nasal spray.  Advised to contact office if not improving as a chest x-ray may be warranted ?- doxycycline (VIBRA-TABS) 100 MG tablet; Take 1 tablet (100 mg total) by mouth 2 (two) times daily.  Dispense: 20 tablet; Refill: 0 ?- predniSONE (DELTASONE) 10 MG tablet; Use per dose pack  Dispense: 21 tablet; Refill: 0 ?- benzonatate (TESSALON) 100 MG capsule; Take 1 capsule (100 mg total) by mouth 2 (two) times daily as needed for cough.  Dispense: 20 capsule; Refill: 0 ? ? ?General Counseling: sala tague understanding of the findings of todays visit and agrees with plan of treatment. I have discussed any further diagnostic evaluation that may be needed or ordered today. We also reviewed her medications today. she has been encouraged to call the office with any questions or concerns that should arise related to todays visit. ? ? ? ?Counseling: ? ? ? ?No orders of the defined types were placed in this encounter. ? ? ?Meds ordered this encounter  ?Medications  ? doxycycline (VIBRA-TABS) 100 MG tablet  ?  Sig: Take 1 tablet (100 mg total) by mouth 2 (two) times daily.  ?  Dispense:  20 tablet  ?  Refill:  0  ? predniSONE (DELTASONE) 10 MG tablet  ?  Sig: Use per dose pack  ?  Dispense:  21 tablet  ?  Refill:  0  ? fluticasone (FLONASE) 50 MCG/ACT nasal spray  ?  Sig: Place 1 spray into both nostrils daily.  ?  Dispense:  16 g  ?  Refill:  3  ? benzonatate (TESSALON) 100 MG capsule  ?  Sig: Take 1 capsule (100 mg total) by mouth 2 (two) times daily as needed for cough.  ?  Dispense:  20  capsule  ?  Refill:  0  ? ? ?Time spent:25 Minutes ?

## 2021-07-17 ENCOUNTER — Other Ambulatory Visit: Payer: Self-pay | Admitting: Physician Assistant

## 2021-07-17 ENCOUNTER — Encounter: Payer: Self-pay | Admitting: Physician Assistant

## 2021-07-17 DIAGNOSIS — R0989 Other specified symptoms and signs involving the circulatory and respiratory systems: Secondary | ICD-10-CM

## 2021-07-17 DIAGNOSIS — J209 Acute bronchitis, unspecified: Secondary | ICD-10-CM

## 2021-07-17 DIAGNOSIS — J440 Chronic obstructive pulmonary disease with acute lower respiratory infection: Secondary | ICD-10-CM

## 2021-07-17 DIAGNOSIS — R0602 Shortness of breath: Secondary | ICD-10-CM

## 2021-07-19 ENCOUNTER — Ambulatory Visit
Admission: RE | Admit: 2021-07-19 | Discharge: 2021-07-19 | Disposition: A | Discharge status: Home / Self Care | Payer: BC Managed Care – PPO | Attending: Physician Assistant | Admitting: Physician Assistant

## 2021-07-19 ENCOUNTER — Ambulatory Visit
Admission: RE | Admit: 2021-07-19 | Discharge: 2021-07-19 | Disposition: A | Discharge status: Home / Self Care | Payer: BC Managed Care – PPO | Source: Ambulatory Visit | Attending: Physician Assistant | Admitting: Physician Assistant

## 2021-07-19 DIAGNOSIS — R0989 Other specified symptoms and signs involving the circulatory and respiratory systems: Secondary | ICD-10-CM

## 2021-07-19 DIAGNOSIS — R0602 Shortness of breath: Secondary | ICD-10-CM | POA: Diagnosis not present

## 2021-07-19 DIAGNOSIS — J440 Chronic obstructive pulmonary disease with acute lower respiratory infection: Secondary | ICD-10-CM | POA: Diagnosis not present

## 2021-07-19 DIAGNOSIS — R0789 Other chest pain: Secondary | ICD-10-CM | POA: Diagnosis not present

## 2021-07-19 DIAGNOSIS — J209 Acute bronchitis, unspecified: Secondary | ICD-10-CM | POA: Diagnosis not present

## 2021-07-24 ENCOUNTER — Telehealth: Payer: Self-pay

## 2021-07-24 NOTE — Telephone Encounter (Signed)
Spoke with patient regarding x-ray results on 07/24/2021. ?

## 2021-07-24 NOTE — Telephone Encounter (Signed)
-----   Message from Mylinda Latina, PA-C sent at 07/23/2021  9:49 AM EDT ----- ?Please let her know that both xrays showed no acute findings. She did have aortic atherosclerosis as previously seen, as well as signs of osteopenia ?

## 2021-07-26 ENCOUNTER — Other Ambulatory Visit: Payer: Self-pay

## 2021-07-26 ENCOUNTER — Encounter: Payer: Self-pay | Admitting: Physician Assistant

## 2021-07-26 ENCOUNTER — Ambulatory Visit (INDEPENDENT_AMBULATORY_CARE_PROVIDER_SITE_OTHER): Payer: BC Managed Care – PPO | Admitting: Physician Assistant

## 2021-07-26 DIAGNOSIS — E1165 Type 2 diabetes mellitus with hyperglycemia: Secondary | ICD-10-CM | POA: Diagnosis not present

## 2021-07-26 DIAGNOSIS — R0602 Shortness of breath: Secondary | ICD-10-CM

## 2021-07-26 DIAGNOSIS — R5383 Other fatigue: Secondary | ICD-10-CM

## 2021-07-26 DIAGNOSIS — I10 Essential (primary) hypertension: Secondary | ICD-10-CM | POA: Diagnosis not present

## 2021-07-26 DIAGNOSIS — Z794 Long term (current) use of insulin: Secondary | ICD-10-CM

## 2021-07-26 DIAGNOSIS — I700 Atherosclerosis of aorta: Secondary | ICD-10-CM

## 2021-07-26 DIAGNOSIS — R000 Tachycardia, unspecified: Secondary | ICD-10-CM | POA: Diagnosis not present

## 2021-07-26 LAB — POCT GLYCOSYLATED HEMOGLOBIN (HGB A1C): Hemoglobin A1C: 6.8 % — AB (ref 4.0–5.6)

## 2021-07-26 NOTE — Progress Notes (Signed)
[facility] ?[address] ?[place], [place] [address] ? ?Internal MEDICINE  ?Office Visit Note ? ?Patient Name: Danielle Wallace ? [id number]  ?[id number] ? ?Date of Service: 07/26/2021 ? ?Chief Complaint  ?Patient presents with  ? Follow-up  ? Diabetes  ? Headache  ? Sore Throat  ?  Having soreness in throat, some nodules and swollen tongue - has been on 2 rounds of antibiotics and prednisone  ? Quality Metric Gaps  ?  Foot and Eye Exam  ? ? ?HPI ?Pt is here for routine follow up,however is still having acute symptoms ?-Continues to feel sinuses swollen, with sore throat, does have some allergy symptoms. No mucus comes out with nasal lavage now and not coughing as much. Has been through 2 rounds of Doxycycline and a round of prednisone ?-When she bends over the gets out of breath. Will have heart racing and has to sit down at times as well. Does not get out of breath with tasks, its after she completes a task that sometimes she then experiences the SOB ?-very fatigued, has not felt well for months. Some lightheadedness and overall feeling of generalized weakness ?-sugars have been fluctuating, 185 this AM, were high with steroid use ?-chest xray and soft tissue neck were both negative for any acute findings. Did have aortic atherosclerosis and mild osteopenia. ?-did have ent evaluation for follow up on thyroid that was normal. States ENT used scope to look at voice box as well and did not report any findings either ?-BP a little elevated and tachycardic in office therefore EKG ordered. Denies any CP or chest discomfort, but some tightness and SOB with palpitations that comes and goes.  Patient did have echo on 04/10/2021 by cardiology and was found to have normal LVEF without any significant structural abnormalities ?-Pt is having a little anxiety after symptoms start but denies feeling stressed or anxious when the symptoms start. ?-denies any reflux symptoms and is taking pantoprazole already ? ?Current  Medication: ?Outpatient Encounter Medications as of 07/26/2021  ?Medication Sig  ? albuterol (VENTOLIN HFA) 108 (90 Base) MCG/ACT inhaler TAKE 2 PUFFS BY MOUTH EVERY 6 HOURS AS NEEDED FOR WHEEZE OR SHORTNESS OF BREATH  ? aspirin EC 81 MG tablet Take 1 tablet (81 mg total) by mouth daily. Swallow whole.  ? B COMPLEX-C PO Take by mouth daily.  ? benzonatate (TESSALON) 100 MG capsule Take 1 capsule (100 mg total) by mouth 2 (two) times daily as needed for cough.  ? Calcium Carb-Cholecalciferol (CALCIUM + D3) 600-200 MG-UNIT TABS Take 1 tablet by mouth 2 (two) times daily.  ? diphenhydrAMINE (BENADRYL) 25 MG tablet Take 25 mg by mouth every 6 (six) hours as needed.  ? doxycycline (VIBRA-TABS) 100 MG tablet Take 1 tablet (100 mg total) by mouth 2 (two) times daily.  ? EPINEPHrine (EPIPEN 2-PAK) 0.3 mg/0.3 mL IJ SOAJ injection Inject 0.3 mLs (0.3 mg total) into the skin as needed.  ? etodolac (LODINE) 400 MG tablet Take 1 tablet (400 mg total) by mouth 2 (two) times daily as needed. for pain  ? fluticasone (FLONASE) 50 MCG/ACT nasal spray Place 1 spray into both nostrils daily.  ? fluticasone furoate-vilanterol (BREO ELLIPTA) 100-25 MCG/ACT AEPB Inhale 1 puff into the lungs daily.  ? glucose blood (ONETOUCH VERIO) test strip Blood sugar testing done QD and as needed  E11.65  ? imipramine (TOFRANIL) 25 MG tablet Take 2 tablets (50 mg total) by mouth at bedtime.  ? insulin degludec (TRESIBA FLEXTOUCH)  100 UNIT/ML FlexTouch Pen Inject 40 Units into the skin daily. Take in the evening, raise dosage by 2 units every 3 days until blood sugar is 130 or below.  ? nadolol (CORGARD) 80 MG tablet Take 1 tablet (80 mg total) by mouth daily.  ? pantoprazole (PROTONIX) 40 MG tablet Take 1 tablet (40 mg total) by mouth daily.  ? pravastatin (PRAVACHOL) 20 MG tablet Take 1 tablet (20 mg total) by mouth daily.  ? predniSONE (DELTASONE) 10 MG tablet Use per dose pack  ? Semaglutide, 1 MG/DOSE, (OZEMPIC, 1 MG/DOSE,) 2 MG/1.5ML SOPN Inject 1  mg into the skin once a week.  ? tamoxifen (NOLVADEX) 10 MG tablet Take 1 tablet (10 mg total) by mouth daily.  ? valsartan-hydrochlorothiazide (DIOVAN-HCT) 80-12.5 MG tablet Take 1 tablet by mouth daily.  ? ?No facility-administered encounter medications on file as of 07/26/2021.  ? ? ?Surgical History: ?Past Surgical History:  ?Procedure Laterality Date  ? BREAST BIOPSY Right 2005  ? negative  ? BREAST BIOPSY Left 08/23/2007  ? negative  ? BREAST BIOPSY Left 10/24/2013  ? positive  ? BREAST BIOPSY Left 07/2002  ? neg  ? BREAST BIOPSY Left 08/23/2007  ? neg  ? BREAST SURGERY Left 10/2011  ? Cyst Aspirationapocrine metaplasia, ductal cells and bone cells, hypo-cellular  ? BREAST SURGERY Left 2009  ? ADH on stereotactic biopsy, 1.5 mm focus.  ? BREAST SURGERY Left 2003  ? fibrocystic changes with ductal hyperplasia without atypia.  ? BREAST SURGERY Left   ? mastectomy  ? BREAST SURGERY Left April 11, 2014  ? Removal of implant, debridement chest wall Dr.Coan  ? [place] [facility]  ? CHOLECYSTECTOMY  1990  ? COLONOSCOPY  08/26/2013  ? Verdie Shire, M.D. normal.  ? CRYOABLATION  2005, 2010  ? CYST REMOVAL NECK  10/2011  ? Dr. Tami Ribas  ? ESOPHAGOGASTRODUODENOSCOPY (EGD) WITH PROPOFOL N/A 01/16/2017  ? Procedure: ESOPHAGOGASTRODUODENOSCOPY (EGD) WITH PROPOFOL;  Surgeon: Lucilla Lame, MD;  Location: [facility];  Service: Gastroenterology;  Laterality: N/A;  Diabetic - oral meds  ? INCISION AND DRAINAGE Left Dec 2015, Feb 2016  ? Dr Tula Nakayama  ? MASTECTOMY Left 11/24/2013  ? positive  ? PORTACATH PLACEMENT  11/24/13  ? ? ?Medical History: ?Past Medical History:  ?Diagnosis Date  ? Arthritis 2006  ? Asthma   ? Breast cancer ([place]) 10/14/2013  ? 18 mm, T1c, N0; ER/ PR positive,her 2 neu overexpressed. Adjuvant chemo/ herceptin.  ? Cancer [facility]) 2006  ? Renal cell carcinoma; cryosurgery treatment, right side  ? Cirrhosis ([place])   ? Collapsed lung 2007  ? Diabetes mellitus without complication ([place]) 7169  ? Metformin  ? Diffuse cystic  mastopathy   ? Family history of breast cancer   ? Family history of liver cancer   ? Motion sickness   ? any moving vehicle  ? Orthodontics   ? braces  ? Personal history of chemotherapy   ? Sinus problem   ? ? ?Family History: ?Family History  ?Problem Relation Age of Onset  ? Liver cancer Father   ? Hemochromatosis Father   ? Liver disease Father   ? Diabetes Mother   ? Hypertension Mother   ? Breast cancer Paternal Aunt 46  ? Ovarian cancer Maternal Grandmother   ?     unk primary, metastatic cancer  ? Hemachromatosis Daughter   ? Liver cancer Paternal Uncle   ? Hemochromatosis Paternal Uncle   ? ? ?Social History  ? ?Socioeconomic  History  ? Marital status: Married  ?  Spouse name: Not on file  ? Number of children: Not on file  ? Years of education: Not on file  ? Highest education level: Not on file  ?Occupational History  ? Not on file  ?Tobacco Use  ? Smoking status: Never  ? Smokeless tobacco: Never  ?Vaping Use  ? Vaping Use: Never used  ?Substance and Sexual Activity  ? Alcohol use: Not Currently  ?  Comment: occasionally, none recently  ? Drug use: No  ? Sexual activity: Not on file  ?Other Topics Concern  ? Not on file  ?Social History Narrative  ? Not on file  ? ?Social Determinants of Health  ? ?Financial Resource Strain: Not on file  ?Food Insecurity: Not on file  ?Transportation Needs: Not on file  ?Physical Activity: Not on file  ?Stress: Not on file  ?Social Connections: Not on file  ?Intimate Partner Violence: Not on file  ? ? ? ? ?Review of Systems  ?Constitutional:  Positive for activity change and fatigue. Negative for chills and unexpected weight change.  ?HENT:  Positive for congestion, postnasal drip and sore throat. Negative for rhinorrhea and sneezing.   ?Eyes:  Negative for redness.  ?Respiratory:  Positive for chest tightness and shortness of breath. Negative for cough and wheezing.   ?Cardiovascular:  Negative for chest pain and palpitations.  ?Gastrointestinal:  Negative for  abdominal pain, constipation, diarrhea, nausea and vomiting.  ?Genitourinary:  Negative for dysuria and frequency.  ?Musculoskeletal:  Negative for arthralgias, back pain, joint swelling and neck pain.  ?Skin:

## 2021-07-27 LAB — COMPREHENSIVE METABOLIC PANEL
ALT: 34 IU/L — ABNORMAL HIGH (ref 0–32)
AST: 35 IU/L (ref 0–40)
Albumin/Globulin Ratio: 1.5 (ref 1.2–2.2)
Albumin: 4.1 g/dL (ref 3.8–4.8)
Alkaline Phosphatase: 102 IU/L (ref 44–121)
BUN/Creatinine Ratio: 25 (ref 12–28)
BUN: 15 mg/dL (ref 8–27)
Bilirubin Total: 0.6 mg/dL (ref 0.0–1.2)
CO2: 28 mmol/L (ref 20–29)
Calcium: 9.6 mg/dL (ref 8.7–10.3)
Chloride: 100 mmol/L (ref 96–106)
Creatinine, Ser: 0.61 mg/dL (ref 0.57–1.00)
Globulin, Total: 2.7 g/dL (ref 1.5–4.5)
Glucose: 147 mg/dL — ABNORMAL HIGH (ref 70–99)
Potassium: 4.6 mmol/L (ref 3.5–5.2)
Sodium: 140 mmol/L (ref 134–144)
Total Protein: 6.8 g/dL (ref 6.0–8.5)
eGFR: 102 mL/min/{1.73_m2} (ref >59)

## 2021-07-27 LAB — CBC WITH DIFFERENTIAL/PLATELET
Basophils Absolute: 0 10*3/uL (ref 0.0–0.2)
Basos: 0 %
EOS (ABSOLUTE): 0.1 10*3/uL (ref 0.0–0.4)
Eos: 1 %
Hematocrit: 44 % (ref 34.0–46.6)
Hemoglobin: 14.7 g/dL (ref 11.1–15.9)
Immature Grans (Abs): 0 10*3/uL (ref 0.0–0.1)
Immature Granulocytes: 0 %
Lymphocytes Absolute: 2.7 10*3/uL (ref 0.7–3.1)
Lymphs: 31 %
MCH: 30.8 pg (ref 26.6–33.0)
MCHC: 33.4 g/dL (ref 31.5–35.7)
MCV: 92 fL (ref 79–97)
Monocytes Absolute: 0.7 10*3/uL (ref 0.1–0.9)
Monocytes: 8 %
Neutrophils Absolute: 5.1 10*3/uL (ref 1.4–7.0)
Neutrophils: 60 %
Platelets: 196 10*3/uL (ref 150–450)
RBC: 4.77 x10E6/uL (ref 3.77–5.28)
RDW: 12.3 % (ref 11.7–15.4)
WBC: 8.6 10*3/uL (ref 3.4–10.8)

## 2021-07-27 LAB — TROPONIN T: Troponin T (Highly Sensitive): 7 ng/L (ref 0–14)

## 2021-07-27 LAB — MAGNESIUM: Magnesium: 1.7 mg/dL (ref 1.6–2.3)

## 2021-07-29 ENCOUNTER — Other Ambulatory Visit (INDEPENDENT_AMBULATORY_CARE_PROVIDER_SITE_OTHER): Payer: BC Managed Care – PPO

## 2021-07-29 ENCOUNTER — Other Ambulatory Visit: Payer: Self-pay

## 2021-07-29 VITALS — BP 119/67 | HR 87 | Temp 98.0°F | Resp 16 | Ht 66.0 in | Wt 207.0 lb

## 2021-07-29 DIAGNOSIS — R079 Chest pain, unspecified: Secondary | ICD-10-CM | POA: Diagnosis not present

## 2021-08-01 ENCOUNTER — Ambulatory Visit (INDEPENDENT_AMBULATORY_CARE_PROVIDER_SITE_OTHER): Payer: BC Managed Care – PPO | Admitting: Internal Medicine

## 2021-08-01 ENCOUNTER — Telehealth: Payer: Self-pay

## 2021-08-01 ENCOUNTER — Encounter: Payer: Self-pay | Admitting: Internal Medicine

## 2021-08-01 ENCOUNTER — Other Ambulatory Visit: Payer: Self-pay

## 2021-08-01 VITALS — BP 120/70 | HR 79 | Temp 98.0°F | Resp 16 | Ht 66.0 in | Wt 204.0 lb

## 2021-08-01 DIAGNOSIS — K7460 Unspecified cirrhosis of liver: Secondary | ICD-10-CM

## 2021-08-01 DIAGNOSIS — Z794 Long term (current) use of insulin: Secondary | ICD-10-CM

## 2021-08-01 DIAGNOSIS — E1165 Type 2 diabetes mellitus with hyperglycemia: Secondary | ICD-10-CM

## 2021-08-01 DIAGNOSIS — R0609 Other forms of dyspnea: Secondary | ICD-10-CM

## 2021-08-01 DIAGNOSIS — I25118 Atherosclerotic heart disease of native coronary artery with other forms of angina pectoris: Secondary | ICD-10-CM

## 2021-08-01 DIAGNOSIS — K769 Liver disease, unspecified: Secondary | ICD-10-CM

## 2021-08-01 NOTE — Progress Notes (Signed)
[facility] [address] [place], [place] [address]  Internal MEDICINE  Office Visit Note  Patient Name: Danielle Wallace  [id number]  [id number]  Date of Service: 08/01/2021  Chief Complaint  Patient presents with   Follow-up    Chest pain    HPI Patient is seen today for acute visit.  -Patient has been having shortness of breath since last year, initially had COVID which was treated however patient's symptoms of shortness of breath did not improve.  She had a CT chest done which showed no pulmonary involvement there was atherosclerosis noted in and coronary arteries.  Initial cardiac work-up including echo without functional study she was also started on aspirin and continue on Pravachol.  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  -Patient continues to have worsening in her symptoms she is having chest pain with radiation to her neck and shortness of breath.  Recently patient has been treated with steroids, diagnosis of acute asthmatic bronchitis, take most of the time  -Patient is a known diabetic for many years her recent hemoglobin A1c is 6.8    Current Medication: Outpatient Encounter Medications as of 08/01/2021  Medication Sig   albuterol (VENTOLIN HFA) 108 (90 Base) MCG/ACT inhaler TAKE 2 PUFFS BY MOUTH EVERY 6 HOURS AS NEEDED FOR WHEEZE OR SHORTNESS OF BREATH   aspirin EC 81 MG tablet Take 1 tablet (81 mg total) by mouth daily. Swallow whole.   B COMPLEX-C PO Take by mouth daily.   benzonatate (TESSALON) 100 MG capsule Take 1 capsule (100 mg total) by mouth 2 (two) times daily as needed for cough.   Calcium Carb-Cholecalciferol (CALCIUM + D3) 600-200 MG-UNIT TABS Take 1 tablet by mouth 2 (two) times daily.   diphenhydrAMINE (BENADRYL) 25 MG tablet Take 25 mg by mouth every 6 (six) hours as needed.   doxycycline (VIBRA-TABS) 100 MG tablet Take 1  tablet (100 mg total) by mouth 2 (two) times daily.   EPINEPHrine (EPIPEN 2-PAK) 0.3 mg/0.3 mL IJ SOAJ injection Inject 0.3 mLs (0.3 mg total) into the skin as needed.   etodolac (LODINE) 400 MG tablet Take 1 tablet (400 mg total) by mouth 2 (two) times daily as needed. for pain   fluticasone (FLONASE) 50 MCG/ACT nasal spray Place 1 spray into both nostrils daily.   fluticasone furoate-vilanterol (BREO ELLIPTA) 100-25 MCG/ACT AEPB Inhale 1 puff into the lungs daily.   glucose blood (ONETOUCH VERIO) test strip Blood sugar testing done QD and as needed  E11.65   imipramine (TOFRANIL) 25 MG tablet Take 2 tablets (50 mg total) by mouth at bedtime.   insulin degludec (TRESIBA FLEXTOUCH) 100 UNIT/ML FlexTouch Pen Inject 40 Units into the skin daily. Take in the evening, raise dosage by 2 units every 3 days until blood sugar is 130 or below.   nadolol (CORGARD) 80 MG tablet Take 1 tablet (80 mg total) by mouth daily.   pantoprazole (PROTONIX) 40 MG tablet Take 1 tablet (40 mg total) by mouth daily.   pravastatin (PRAVACHOL) 20 MG tablet Take 1 tablet (20 mg total) by mouth daily.   predniSONE (DELTASONE) 10 MG tablet Use per dose pack   Semaglutide, 1 MG/DOSE, (OZEMPIC, 1 MG/DOSE,) 2 MG/1.5ML SOPN Inject 1 mg into the skin once a week.   tamoxifen (NOLVADEX) 10 MG tablet Take 1 tablet (10 mg total) by mouth daily.   valsartan-hydrochlorothiazide (DIOVAN-HCT)  80-12.5 MG tablet Take 1 tablet by mouth daily.   No facility-administered encounter medications on file as of 08/01/2021.    Surgical History: Past Surgical History:  Procedure Laterality Date   BREAST BIOPSY Right 2005   negative   BREAST BIOPSY Left 08/23/2007   negative   BREAST BIOPSY Left 10/24/2013   positive   BREAST BIOPSY Left 07/2002   neg   BREAST BIOPSY Left 08/23/2007   neg   BREAST SURGERY Left 10/2011   Cyst Aspirationapocrine metaplasia, ductal cells and bone cells, hypo-cellular   BREAST SURGERY Left 2009   ADH on  stereotactic biopsy, 1.5 mm focus.   BREAST SURGERY Left 2003   fibrocystic changes with ductal hyperplasia without atypia.   BREAST SURGERY Left    mastectomy   BREAST SURGERY Left April 11, 2014   Removal of implant, debridement chest wall Dr.Coan   CESAREAN SECTION  1991   CHOLECYSTECTOMY  1990   COLONOSCOPY  08/26/2013   Lutricia Feil, M.D. normal.   CRYOABLATION  2005, 2010   CYST REMOVAL NECK  10/2011   Dr. Jenne Campus   ESOPHAGOGASTRODUODENOSCOPY (EGD) WITH PROPOFOL N/A 01/16/2017   Procedure: ESOPHAGOGASTRODUODENOSCOPY (EGD) WITH PROPOFOL;  Surgeon: Midge Minium, MD;  Location: [facility];  Service: Gastroenterology;  Laterality: N/A;  Diabetic - oral meds   INCISION AND DRAINAGE Left Dec 2015, Feb 2016   Dr Meriam Sprague   MASTECTOMY Left 11/24/2013   positive   PORTACATH PLACEMENT  11/24/13    Medical History: Past Medical History:  Diagnosis Date   Arthritis 2006   Asthma    Breast cancer (HCC) 10/14/2013   18 mm, T1c, N0; ER/ PR positive,her 2 neu overexpressed. Adjuvant chemo/ herceptin.   Cancer [facility]) 2006   Renal cell carcinoma; cryosurgery treatment, right side   Cirrhosis (HCC)    Collapsed lung 2007   Diabetes mellitus without complication (HCC) 2006   Metformin   Diffuse cystic mastopathy    Family history of breast cancer    Family history of liver cancer    Motion sickness    any moving vehicle   Orthodontics    braces   Personal history of chemotherapy    Sinus problem     Family History: Family History  Problem Relation Age of Onset   Liver cancer Father    Hemochromatosis Father    Liver disease Father    Diabetes Mother    Hypertension Mother    Breast cancer Paternal Aunt 37   Ovarian cancer Maternal Grandmother        unk primary, metastatic cancer   Hemachromatosis Daughter    Liver cancer Paternal Uncle    Hemochromatosis Paternal Uncle     Social History   Socioeconomic History   Marital status: Married    Spouse name: Not on file    Number of children: Not on file   Years of education: Not on file   Highest education level: Not on file  Occupational History   Not on file  Tobacco Use   Smoking status: Never   Smokeless tobacco: Never  Vaping Use   Vaping Use: Never used  Substance and Sexual Activity   Alcohol use: Not Currently    Comment: occasionally, none recently   Drug use: No   Sexual activity: Not on file  Other Topics Concern   Not on file  Social History Narrative   Not on file   Social Determinants of Health   Financial Resource Strain: Not on  file  Food Insecurity: Not on file  Transportation Needs: Not on file  Physical Activity: Not on file  Stress: Not on file  Social Connections: Not on file  Intimate Partner Violence: Not on file      Review of Systems  Constitutional:  Negative for fatigue and fever.  HENT:  Negative for congestion, mouth sores and postnasal drip.   Respiratory:  Positive for chest tightness and shortness of breath. Negative for cough.   Cardiovascular:  Negative for chest pain.  Genitourinary:  Negative for flank pain.  Neurological:  Positive for weakness.  Psychiatric/Behavioral: Negative.     Vital Signs: BP 120/70   Pulse 79   Temp 98 F (36.7 C)   Resp 16   Ht 5\' 6"  (1.676 m)   Wt 204 lb (92.5 kg)   SpO2 99%   BMI 32.93 kg/m    Physical Exam Constitutional:      Appearance: Normal appearance.     Comments: Mild respiratory distress during conversation  HENT:     Head: Normocephalic and atraumatic.     Nose: Nose normal.     Mouth/Throat:     Mouth: Mucous membranes are moist.     Pharynx: No posterior oropharyngeal erythema.  Eyes:     Extraocular Movements: Extraocular movements intact.     Pupils: Pupils are equal, round, and reactive to light.  Cardiovascular:     Pulses: Normal pulses.     Heart sounds: Normal heart sounds.  Pulmonary:     Effort: Pulmonary effort is normal.     Breath sounds: Normal breath sounds.   Neurological:     General: No focal deficit present.     Mental Status: She is alert.  Psychiatric:        Mood and Affect: Mood normal.        Behavior: Behavior normal.     Assessment/Plan: 1. Dyspnea on exertion Patient continues to have shortness of breath with exertion now getting worse, concerned about anginal symptoms, pt needs to have cardiac cath or any functional study  - Ambulatory referral to Cardiology  2. Atherosclerosis of coronary artery of native heart with other form of angina pectoris, unspecified vessel or lesion type [facility])  patient had abnormal CT chest last year which showed coronary atherosclerosis patient needs further diagnostic and evaluation - Ambulatory referral to Cardiology  3. Chronic liver disease and cirrhosis (HCC) Patient has chronic cirrhosis of the liver with portal hypertension, no ascites patient is followed by GI for portal hypertension patient is on NovoLog she is not on any Spiriva or Lasix at this point as there is no ascites, if her cardiac evaluation is negative patient might need to be looked at by GI, as due to hepatomegaly patient could have symptomatic dyspnea  4. Type 2 diabetes mellitus with hyperglycemia, with long-term current use of insulin (HCC) We will continue on current treatment patient hemoglobin A1c is at target and doing well  General Counseling: Mikeal Hawthorne understanding of the findings of todays visit and agrees with plan of treatment. I have discussed any further diagnostic evaluation that may be needed or ordered today. We also reviewed her medications today. she has been encouraged to call the office with any questions or concerns that should arise related to todays visit.    Orders Placed This Encounter  Procedures   Ambulatory referral to Cardiology      Total time spent:45 Minutes Time spent includes review of chart, medications, test results, and follow  up plan with the patient.   [place] Controlled  Substance Database was reviewed by me.   Dr Lyndon Code Internal medicine

## 2021-08-01 NOTE — Telephone Encounter (Signed)
Contacted patient to advise her to be expecting a call from [facility] that per Dr. Clayborn Bigness they will be reaching out to get her in the office tomorrow. I contacted [facility] and they will be reaching out to the patient to schedule her today. ?

## 2021-08-02 ENCOUNTER — Telehealth: Payer: Self-pay

## 2021-08-02 DIAGNOSIS — R0602 Shortness of breath: Secondary | ICD-10-CM | POA: Diagnosis not present

## 2021-08-02 DIAGNOSIS — R5383 Other fatigue: Secondary | ICD-10-CM | POA: Diagnosis not present

## 2021-08-02 DIAGNOSIS — E119 Type 2 diabetes mellitus without complications: Secondary | ICD-10-CM | POA: Diagnosis not present

## 2021-08-02 DIAGNOSIS — I208 Other forms of angina pectoris: Secondary | ICD-10-CM | POA: Diagnosis not present

## 2021-08-02 NOTE — Telephone Encounter (Signed)
Urgent cardio referral sent via [facility] to Dr. Clayborn Bigness w/ [facility] ?

## 2021-08-04 ENCOUNTER — Encounter: Payer: Self-pay | Admitting: Physician Assistant

## 2021-08-04 ENCOUNTER — Other Ambulatory Visit: Payer: Self-pay | Admitting: Physician Assistant

## 2021-08-04 ENCOUNTER — Other Ambulatory Visit: Payer: Self-pay

## 2021-08-04 MED ORDER — NOVOTWIST PEN NEEDLE 32G X 5 MM MISC: 3 refills | Status: DC

## 2021-08-05 ENCOUNTER — Other Ambulatory Visit: Payer: Self-pay | Admitting: Physician Assistant

## 2021-08-05 DIAGNOSIS — E1165 Type 2 diabetes mellitus with hyperglycemia: Secondary | ICD-10-CM

## 2021-08-06 ENCOUNTER — Telehealth: Payer: Self-pay

## 2021-08-06 NOTE — Telephone Encounter (Signed)
Cardio appointment 08/02/21 with Dr. Mellody Dance ?

## 2021-08-08 ENCOUNTER — Encounter (HOSPITAL_COMMUNITY): Payer: Self-pay

## 2021-08-08 ENCOUNTER — Other Ambulatory Visit (HOSPITAL_COMMUNITY): Payer: Self-pay | Admitting: *Deleted

## 2021-08-08 ENCOUNTER — Other Ambulatory Visit: Payer: Self-pay | Admitting: Internal Medicine

## 2021-08-08 DIAGNOSIS — I208 Other forms of angina pectoris: Secondary | ICD-10-CM

## 2021-08-08 DIAGNOSIS — R0602 Shortness of breath: Secondary | ICD-10-CM

## 2021-08-08 MED ORDER — METOPROLOL TARTRATE 50 MG PO TABS: ORAL_TABLET | ORAL | 0 refills | Status: DC

## 2021-08-08 MED ORDER — IVABRADINE HCL 7.5 MG PO TABS: ORAL_TABLET | ORAL | 0 refills | Status: DC

## 2021-08-09 ENCOUNTER — Telehealth (HOSPITAL_COMMUNITY): Payer: Self-pay | Admitting: *Deleted

## 2021-08-09 ENCOUNTER — Other Ambulatory Visit: Payer: Self-pay

## 2021-08-09 ENCOUNTER — Other Ambulatory Visit: Payer: Self-pay | Admitting: Physician Assistant

## 2021-08-09 MED ORDER — BD PEN NEEDLE NANO 2ND GEN 32G X 4 MM MISC: 3 refills | Status: DC

## 2021-08-09 NOTE — Telephone Encounter (Signed)
Reaching out to patient to offer assistance regarding upcoming cardiac imaging study; pt verbalizes understanding of appt date/time, parking situation and where to check in, pre-test NPO status and medications ordered, and verified current allergies; name and call back number provided for further questions should they arise ? ?Gordy Clement RN Navigator Cardiac Imaging ?[facility] ?[phone] office ?[phone] cell ? ?Patient to take 9m metoprolol tartrate and 161mivabradine two hours prior to her cardiac CT scan.   ?

## 2021-08-12 ENCOUNTER — Ambulatory Visit
Admission: RE | Admit: 2021-08-12 | Discharge: 2021-08-12 | Disposition: A | Discharge status: Home / Self Care | Payer: BC Managed Care – PPO | Source: Ambulatory Visit | Attending: Internal Medicine | Admitting: Internal Medicine

## 2021-08-12 ENCOUNTER — Other Ambulatory Visit: Payer: Self-pay

## 2021-08-12 DIAGNOSIS — R0602 Shortness of breath: Secondary | ICD-10-CM | POA: Insufficient documentation

## 2021-08-12 DIAGNOSIS — I208 Other forms of angina pectoris: Secondary | ICD-10-CM | POA: Diagnosis not present

## 2021-08-12 MED ORDER — METOPROLOL TARTRATE 5 MG/5ML IV SOLN
10.0000 mg | Freq: Once | INTRAVENOUS | Status: AC
  Administered 2021-08-12: 10 mg via INTRAVENOUS

## 2021-08-12 MED ORDER — NITROGLYCERIN 0.4 MG SL SUBL
0.8000 mg | SUBLINGUAL_TABLET | Freq: Once | SUBLINGUAL | Status: AC
  Administered 2021-08-12: 0.8 mg via SUBLINGUAL

## 2021-08-12 MED ORDER — IOHEXOL 350 MG/ML SOLN
75.0000 mL | Freq: Once | INTRAVENOUS | Status: AC | PRN
  Administered 2021-08-12: 75 mL via INTRAVENOUS

## 2021-08-12 MED ORDER — DILTIAZEM HCL 25 MG/5ML IV SOLN: 10.0000 mg | Freq: Once | INTRAVENOUS | Status: DC

## 2021-08-12 NOTE — Progress Notes (Signed)
Patient tolerated procedure well. Ambulate w/o difficulty. Denies light headedness or being dizzy. Sitting in chair drinking water provided. Encouraged to drink extra water today and reasoning explained. Verbalized understanding. All questions answered. ABC intact. No further needs. Discharge from procedure area w/o issues.   °

## 2021-08-16 ENCOUNTER — Other Ambulatory Visit: Payer: Self-pay | Admitting: Physician Assistant

## 2021-08-16 DIAGNOSIS — M159 Polyosteoarthritis, unspecified: Secondary | ICD-10-CM

## 2021-08-16 DIAGNOSIS — K580 Irritable bowel syndrome with diarrhea: Secondary | ICD-10-CM

## 2021-08-19 ENCOUNTER — Ambulatory Visit
Admission: RE | Admit: 2021-08-19 | Discharge: 2021-08-19 | Disposition: A | Discharge status: Home / Self Care | Payer: BC Managed Care – PPO | Source: Ambulatory Visit | Attending: Internal Medicine | Admitting: Internal Medicine

## 2021-08-19 DIAGNOSIS — Z13820 Encounter for screening for osteoporosis: Secondary | ICD-10-CM | POA: Diagnosis not present

## 2021-08-19 DIAGNOSIS — R079 Chest pain, unspecified: Secondary | ICD-10-CM | POA: Diagnosis not present

## 2021-08-19 DIAGNOSIS — Z780 Asymptomatic menopausal state: Secondary | ICD-10-CM | POA: Diagnosis not present

## 2021-08-23 ENCOUNTER — Ambulatory Visit: Payer: BC Managed Care – PPO | Admitting: Internal Medicine

## 2021-08-23 ENCOUNTER — Other Ambulatory Visit: Payer: BC Managed Care – PPO

## 2021-08-26 ENCOUNTER — Other Ambulatory Visit: Payer: Self-pay

## 2021-08-26 MED ORDER — LINACLOTIDE 290 MCG PO CAPS: 290.0000 ug | ORAL_CAPSULE | Freq: Every day | ORAL | 1 refills | Status: DC

## 2021-08-26 NOTE — Telephone Encounter (Signed)
Spoke with pt she still using  linzess as per lauren add linzess 290 mg take 1 cap daily  ?

## 2021-08-30 DIAGNOSIS — I208 Other forms of angina pectoris: Secondary | ICD-10-CM | POA: Diagnosis not present

## 2021-08-30 DIAGNOSIS — R0602 Shortness of breath: Secondary | ICD-10-CM | POA: Diagnosis not present

## 2021-08-30 DIAGNOSIS — R5383 Other fatigue: Secondary | ICD-10-CM | POA: Diagnosis not present

## 2021-08-30 DIAGNOSIS — E119 Type 2 diabetes mellitus without complications: Secondary | ICD-10-CM | POA: Diagnosis not present

## 2021-09-02 ENCOUNTER — Encounter: Payer: Self-pay | Admitting: Internal Medicine

## 2021-09-02 ENCOUNTER — Inpatient Hospital Stay (HOSPITAL_BASED_OUTPATIENT_CLINIC_OR_DEPARTMENT_OTHER): Payer: BC Managed Care – PPO | Admitting: Internal Medicine

## 2021-09-02 ENCOUNTER — Encounter: Payer: BC Managed Care – PPO | Admitting: Physician Assistant

## 2021-09-02 ENCOUNTER — Inpatient Hospital Stay: Payer: BC Managed Care – PPO | Attending: Internal Medicine

## 2021-09-02 DIAGNOSIS — C50412 Malignant neoplasm of upper-outer quadrant of left female breast: Secondary | ICD-10-CM | POA: Diagnosis not present

## 2021-09-02 DIAGNOSIS — E042 Nontoxic multinodular goiter: Secondary | ICD-10-CM | POA: Insufficient documentation

## 2021-09-02 DIAGNOSIS — M1990 Unspecified osteoarthritis, unspecified site: Secondary | ICD-10-CM | POA: Diagnosis not present

## 2021-09-02 DIAGNOSIS — E119 Type 2 diabetes mellitus without complications: Secondary | ICD-10-CM | POA: Insufficient documentation

## 2021-09-02 DIAGNOSIS — C641 Malignant neoplasm of right kidney, except renal pelvis: Secondary | ICD-10-CM | POA: Insufficient documentation

## 2021-09-02 DIAGNOSIS — Z170 Estrogen receptor positive status [ER+]: Secondary | ICD-10-CM

## 2021-09-02 DIAGNOSIS — Z8041 Family history of malignant neoplasm of ovary: Secondary | ICD-10-CM | POA: Diagnosis not present

## 2021-09-02 DIAGNOSIS — R232 Flushing: Secondary | ICD-10-CM | POA: Diagnosis not present

## 2021-09-02 DIAGNOSIS — Z853 Personal history of malignant neoplasm of breast: Secondary | ICD-10-CM | POA: Insufficient documentation

## 2021-09-02 DIAGNOSIS — Z803 Family history of malignant neoplasm of breast: Secondary | ICD-10-CM | POA: Insufficient documentation

## 2021-09-02 LAB — COMPREHENSIVE METABOLIC PANEL
ALT: 31 U/L (ref 0–44)
AST: 33 U/L (ref 15–41)
Albumin: 3.5 g/dL (ref 3.5–5.0)
Alkaline Phosphatase: 85 U/L (ref 38–126)
Anion gap: 4 — ABNORMAL LOW (ref 5–15)
BUN: 24 mg/dL — ABNORMAL HIGH (ref 8–23)
CO2: 29 mmol/L (ref 22–32)
Calcium: 8.8 mg/dL — ABNORMAL LOW (ref 8.9–10.3)
Chloride: 103 mmol/L (ref 98–111)
Creatinine, Ser: 0.6 mg/dL (ref 0.44–1.00)
GFR, Estimated: >60 mL/min (ref >60)
Glucose, Bld: 125 mg/dL — ABNORMAL HIGH (ref 70–99)
Potassium: 4.3 mmol/L (ref 3.5–5.1)
Sodium: 136 mmol/L (ref 135–145)
Total Bilirubin: 0.5 mg/dL (ref 0.3–1.2)
Total Protein: 6.7 g/dL (ref 6.5–8.1)

## 2021-09-02 LAB — CBC WITH DIFFERENTIAL/PLATELET
Abs Immature Granulocytes: 0.03 10*3/uL (ref 0.00–0.07)
Basophils Absolute: 0 10*3/uL (ref 0.0–0.1)
Basophils Relative: 0 %
Eosinophils Absolute: 0.1 10*3/uL (ref 0.0–0.5)
Eosinophils Relative: 1 %
HCT: 40.3 % (ref 36.0–46.0)
Hemoglobin: 13.6 g/dL (ref 12.0–15.0)
Immature Granulocytes: 0 %
Lymphocytes Relative: 34 %
Lymphs Abs: 2.5 10*3/uL (ref 0.7–4.0)
MCH: 31.4 pg (ref 26.0–34.0)
MCHC: 33.7 g/dL (ref 30.0–36.0)
MCV: 93.1 fL (ref 80.0–100.0)
Monocytes Absolute: 0.6 10*3/uL (ref 0.1–1.0)
Monocytes Relative: 9 %
Neutro Abs: 4.1 10*3/uL (ref 1.7–7.7)
Neutrophils Relative %: 56 %
Platelets: 198 10*3/uL (ref 150–400)
RBC: 4.33 MIL/uL (ref 3.87–5.11)
RDW: 12 % (ref 11.5–15.5)
WBC: 7.4 10*3/uL (ref 4.0–10.5)
nRBC: 0 % (ref 0.0–0.2)

## 2021-09-02 NOTE — Progress Notes (Signed)
[place] ?OFFICE PROGRESS NOTE ? ?Patient Care Team: ?McDonough, Ricki Rodriguez as PCP - General (Physician Assistant) ?Kate Sable, MD as PCP - Cardiology (Cardiology) ?Robert Bellow, MD (General Surgery) ?Dion Body, MD as Referring Physician (Family Medicine) ?Forest Gleason, MD (Oncology) ?Cammie Sickle, MD as Consulting Physician (Hematology and Oncology) ? ? Cancer Staging  ?Carcinoma of upper-outer quadrant of left breast in female, estrogen receptor positive ([place]) ?Staging form: Breast, AJCC 7th Edition ?- Clinical: No stage assigned - Unsigned ? ? ? ?Oncology History Overview Note  ?# July 2016- 1.carcinoma of breast(left) T1 C. N0 M0 status post ultrasound-guided biopsy Estrogen receptor positive. Progesterone receptor positive.  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 ?chemotherapy on hold since November because of cellulitis in the chest wall. ?4.started on Herceptin May 22, 2014.  Cause of recurrent chest wall  infection chemotherapy was put on hold. 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  Patient is on multiple antibiotic and as per infectious disease specialist similar to stay on antibiotic for 4 months. ?So chemotherapy has been discontinued. ?Maintenance Herceptin as well as patient was started on letrozole from June 12, 2014 ?6.  Port was removed because of Pseudomonas infection of the pocket (August, 2016) ?7.  Patient is finishing up Herceptin in August of 2016 now on letrozole;  ? ?# FEB 2017- TAMOXIFEN [poor tolerance to AI] ? ? ?# Jan 2019- BCI- intermediate risk- recommend EXTENDED TAM ? ?# Cirrhosis/ ? Etiology; gastric varices  [Dr.Wohl] ? 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 ?# GENETICS- s/p Counseling [2022]- ? ?DIAGNOSIS: BREAST CA; Triple positive ? ?STAGE:  I       ;GOALS: curative ? ?CURRENT/MOST RECENT THERAPY: Tamoxifen ? ?  ?Carcinoma of upper-outer quadrant of left breast in female, estrogen receptor positive ([place])  ? ? ? ?INTERVAL HISTORY: ? ?VERYL ABRIL 61 y.o.  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 ? ?In the interim patient diagnosed with COVID.  Patient continues to complain of significant fatigue.  Also complains of palpitations and labile blood pressures.  S/p evaluation with cardiology.  CTA-as per patient unremarkable.  However noted to have a 5 mm right lower lobe lung nodule.  ? ? ?Patient denies any nausea vomiting headaches.  Denies any chest pain.   ? ?Review of Systems  ?Constitutional:  Positive for malaise/fatigue. Negative for chills, diaphoresis, fever and weight loss.  ?HENT:  Negative for nosebleeds and sore throat.   ?Eyes:  Negative for double vision.  ?Respiratory:  Negative for cough, hemoptysis, sputum production, shortness of breath and wheezing.   ?Cardiovascular:  Negative for chest pain, palpitations, orthopnea and leg swelling.  ?Gastrointestinal:  Negative for abdominal pain, blood in stool, constipation, diarrhea, heartburn, melena, nausea and vomiting.  ?Genitourinary:  Negative for dysuria, frequency and urgency.  ?Musculoskeletal:  Positive for joint pain and myalgias.  ?Skin: Negative.  Negative for itching and rash.  ?Neurological:  Negative for dizziness, tingling, focal weakness, weakness and headaches.  ?Endo/Heme/Allergies:  Does not bruise/bleed easily.  ?Psychiatric/Behavioral:  Negative for  depression. The patient is not nervous/anxious and does not have insomnia.   ? ? ?PAST MEDICAL HISTORY :  ?Past  Medical History:  ?Diagnosis Date  ? Arthritis 2006  ? Asthma   ? Breast cancer ([place]) 10/14/2013  ? 18 mm, T1c, N0; ER/ PR positive,her 2 neu overexpressed. Adjuvant chemo/ herceptin.  ? Cancer [facility]) 2006  ? Renal cell carcinoma; cryosurgery treatment, right side  ? Cirrhosis ([place])   ? Collapsed lung 2007  ? Diabetes mellitus without complication ([place]) 1749  ? Metformin  ? Diffuse cystic mastopathy   ? Family history of breast cancer   ? Family history of liver cancer   ? Motion sickness   ? any moving vehicle  ? Orthodontics   ? braces  ? Personal history of chemotherapy   ? Sinus problem   ? ? ?PAST SURGICAL HISTORY :   ?Past Surgical History:  ?Procedure Laterality Date  ? BREAST BIOPSY Right 2005  ? negative  ? BREAST BIOPSY Left 08/23/2007  ? negative  ? BREAST BIOPSY Left 10/24/2013  ? positive  ? BREAST BIOPSY Left 07/2002  ? neg  ? BREAST BIOPSY Left 08/23/2007  ? neg  ? BREAST SURGERY Left 10/2011  ? Cyst Aspirationapocrine metaplasia, ductal cells and bone cells, hypo-cellular  ? BREAST SURGERY Left 2009  ? ADH on stereotactic biopsy, 1.5 mm focus.  ? BREAST SURGERY Left 2003  ? fibrocystic changes with ductal hyperplasia without atypia.  ? BREAST SURGERY Left   ? mastectomy  ? BREAST SURGERY Left April 11, 2014  ? Removal of implant, debridement chest wall Dr.Coan  ? [facility]  ? CHOLECYSTECTOMY  1990  ? COLONOSCOPY  08/26/2013  ? Verdie Shire, M.D. normal.  ? CRYOABLATION  2005, 2010  ? CYST REMOVAL NECK  10/2011  ? Dr. Tami Ribas  ? ESOPHAGOGASTRODUODENOSCOPY (EGD) WITH PROPOFOL N/A 01/16/2017  ? Procedure: ESOPHAGOGASTRODUODENOSCOPY (EGD) WITH PROPOFOL;  Surgeon: Lucilla Lame, MD;  Location: [place];  Service: Gastroenterology;  Laterality: N/A;  Diabetic - oral meds  ? INCISION AND DRAINAGE Left Dec 2015, Feb 2016  ? Dr Tula Nakayama  ? MASTECTOMY Left 11/24/2013  ? positive  ? PORTACATH PLACEMENT  11/24/13  ? ? ?FAMILY HISTORY :   ?Family History  ?Problem Relation Age of Onset  ? Liver cancer Father    ? Hemochromatosis Father   ? Liver disease Father   ? Diabetes Mother   ? Hypertension Mother   ? Breast cancer Paternal Aunt 43  ? Ovarian cancer Maternal Grandmother   ?     unk primary, metastatic cancer  ? Hemachromatosis Daughter   ? Liver cancer Paternal Uncle   ? Hemochromatosis Paternal Uncle   ? ? ?SOCIAL HISTORY:   ?Social History  ? ?Tobacco Use  ? Smoking status: Never  ? Smokeless tobacco: Never  ?Vaping Use  ? Vaping Use: Never used  ?Substance Use Topics  ? Alcohol use: Not Currently  ?  Comment: occasionally, none recently  ? Drug use: No  ? ? ?ALLERGIES:  is allergic to exemestane, floxin [ofloxacin], gluten meal, levofloxacin, linezolid, sesame oil, taxotere [docetaxel], hydrocodone, invokana [canagliflozin], metformin and related, sulfa antibiotics, codeine, penicillins, and vancomycin. ? ?MEDICATIONS:  ?Current Outpatient Medications  ?Medication Sig Dispense Refill  ? albuterol (VENTOLIN HFA) 108 (90 Base) MCG/ACT inhaler TAKE 2 PUFFS BY MOUTH EVERY 6 HOURS AS NEEDED FOR WHEEZE OR SHORTNESS OF BREATH 6.7 each 1  ? aspirin EC 81 MG tablet Take 1 tablet (  81 mg total) by mouth daily. Swallow whole.    ? B COMPLEX-C PO Take by mouth daily.    ? BREO ELLIPTA 100-25 MCG/ACT AEPB USE 1 INHALATION DAILY 120 each 5  ? diphenhydrAMINE (BENADRYL) 25 MG tablet Take 25 mg by mouth every 6 (six) hours as needed.    ? EPINEPHrine (EPIPEN 2-PAK) 0.3 mg/0.3 mL IJ SOAJ injection Inject 0.3 mLs (0.3 mg total) into the skin as needed. 1 each 1  ? etodolac (LODINE) 400 MG tablet TAKE 1 TABLET TWICE A DAY AS NEEDED FOR PAIN (Patient taking differently: 2 (two) times daily.) 180 tablet 3  ? fluticasone (FLONASE) 50 MCG/ACT nasal spray Place 1 spray into both nostrils daily. 16 g 3  ? glucose blood (ONETOUCH VERIO) test strip Blood sugar testing done QD and as needed  E11.65 100 each 12  ? imipramine (TOFRANIL) 25 MG tablet TAKE 2 TABLETS AT BEDTIME 180 tablet 3  ? insulin degludec (TRESIBA FLEXTOUCH) 100 UNIT/ML  FlexTouch Pen Inject 40 Units into the skin daily. Take in the evening, raise dosage by 2 units every 3 days until blood sugar is 130 or below. 45 mL 1  ? Insulin Pen Needle (BD PEN NEEDLE NANO 2ND GEN) 32G X 4 MM

## 2021-09-02 NOTE — Assessment & Plan Note (Addendum)
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 On Taomoxifen 10 mg/day [20 mg-arthritis].  Tolerating well.  ? ?# Clinically no evidence of recurrence.  STABLE. OCT 2022- Mammogram -[facility]- Dr.Byrnet ? ?# Arthritis/musculoskeletal symptoms- G-1; STABLE.  continue Tamoxifen 65m/day. ? ?# hot flashes/ vasomotor symptoms-from Tam-STABLE ? ?# Right LL nodulle 567m[incidental cardiac CT scan FEB 2023]-we will follow-up CT scan in 6 months ? ?# BMD- [APRIl 2023-]  T score=--0.3 continue  vitamin D- STABLE; mild low ca- 8.8.  Reviewed the bone density.    Also recommend taking calcium; along with vitamin D. ? ?# Right RCC-s/p cryoablation; MRI aug 2022- STABLE. ? ?#Thyroid nodules-incidental [right-3 mm; left 2 nodules-3 and 4 mm] neck US-non-Thyroid  [place] 2023 monitof for now. ? ?# DISPOSITION:  ?# follow up in 6 months- MD; cbc/cmp; CT chest prior---Dr.B ?

## 2021-09-02 NOTE — Progress Notes (Signed)
Patient has been feeling lightheaded with dizziness and currently being evaluated by cardiology.  Also new difficulty breathing and his scheduled to see pulmonologist this week.  ?

## 2021-09-06 DIAGNOSIS — J989 Respiratory disorder, unspecified: Secondary | ICD-10-CM | POA: Diagnosis not present

## 2021-09-06 DIAGNOSIS — R0602 Shortness of breath: Secondary | ICD-10-CM | POA: Diagnosis not present

## 2021-09-06 DIAGNOSIS — G4733 Obstructive sleep apnea (adult) (pediatric): Secondary | ICD-10-CM | POA: Diagnosis not present

## 2021-09-06 DIAGNOSIS — K7469 Other cirrhosis of liver: Secondary | ICD-10-CM | POA: Diagnosis not present

## 2021-09-09 DIAGNOSIS — R000 Tachycardia, unspecified: Secondary | ICD-10-CM | POA: Diagnosis not present

## 2021-09-10 DIAGNOSIS — Z0189 Encounter for other specified special examinations: Secondary | ICD-10-CM | POA: Diagnosis not present

## 2021-09-12 DIAGNOSIS — R000 Tachycardia, unspecified: Secondary | ICD-10-CM | POA: Diagnosis not present

## 2021-09-12 DIAGNOSIS — Z713 Dietary counseling and surveillance: Secondary | ICD-10-CM | POA: Diagnosis not present

## 2021-09-12 DIAGNOSIS — R002 Palpitations: Secondary | ICD-10-CM | POA: Diagnosis not present

## 2021-09-12 DIAGNOSIS — I10 Essential (primary) hypertension: Secondary | ICD-10-CM | POA: Diagnosis not present

## 2021-09-12 DIAGNOSIS — Z043 Encounter for examination and observation following other accident: Secondary | ICD-10-CM | POA: Diagnosis not present

## 2021-09-20 DIAGNOSIS — G479 Sleep disorder, unspecified: Secondary | ICD-10-CM | POA: Diagnosis not present

## 2021-09-20 DIAGNOSIS — R911 Solitary pulmonary nodule: Secondary | ICD-10-CM | POA: Diagnosis not present

## 2021-09-20 DIAGNOSIS — R5382 Chronic fatigue, unspecified: Secondary | ICD-10-CM | POA: Diagnosis not present

## 2021-09-20 DIAGNOSIS — R0609 Other forms of dyspnea: Secondary | ICD-10-CM | POA: Diagnosis not present

## 2021-09-23 ENCOUNTER — Ambulatory Visit: Payer: BC Managed Care – PPO | Admitting: Internal Medicine

## 2021-09-27 ENCOUNTER — Encounter: Payer: BC Managed Care – PPO | Admitting: Physician Assistant

## 2021-10-01 ENCOUNTER — Encounter: Payer: Self-pay | Admitting: Internal Medicine

## 2021-10-01 ENCOUNTER — Ambulatory Visit: Payer: BC Managed Care – PPO | Admitting: Internal Medicine

## 2021-10-01 VITALS — BP 92/62 | HR 83 | Temp 98.2°F | Resp 16 | Ht 66.0 in | Wt 204.8 lb

## 2021-10-01 DIAGNOSIS — E1165 Type 2 diabetes mellitus with hyperglycemia: Secondary | ICD-10-CM | POA: Diagnosis not present

## 2021-10-01 DIAGNOSIS — K769 Liver disease, unspecified: Secondary | ICD-10-CM

## 2021-10-01 DIAGNOSIS — I952 Hypotension due to drugs: Secondary | ICD-10-CM

## 2021-10-01 DIAGNOSIS — R0602 Shortness of breath: Secondary | ICD-10-CM

## 2021-10-01 DIAGNOSIS — K7460 Unspecified cirrhosis of liver: Secondary | ICD-10-CM | POA: Diagnosis not present

## 2021-10-01 DIAGNOSIS — J449 Chronic obstructive pulmonary disease, unspecified: Secondary | ICD-10-CM

## 2021-10-01 DIAGNOSIS — J4520 Mild intermittent asthma, uncomplicated: Secondary | ICD-10-CM | POA: Diagnosis not present

## 2021-10-01 NOTE — Progress Notes (Signed)
[facility] [place], [place] [address]  Internal MEDICINE  Office Visit Note  Patient Name: Danielle Wallace  [id number]  [id number]  Date of Service: 10/11/2021  Chief Complaint  Patient presents with   Follow-up   Diabetes   Fatigue    Still having weakness - brought in test results   Quality Metric Gaps    Foot Exam   Hypertension    BP has been fluctuating from high to low    HPI Patient is here for acute and sick visit. -She continues to have worsening shortness of breath, weakness and palpitations periodically.  Some of the symptoms appear after she had COVID. -Patient has seen cardiology Holter monitor was not significant she also had cardiac CT which did not show any significant coronary artery disease or arthrosclerosis. -Patient also has a cirrhosis of the liver has been seen by GI and is maintained on a beta-blocker -Her blood pressure is also fluctuating most with numbers are low   Current Medication: Outpatient Encounter Medications as of 10/01/2021  Medication Sig   albuterol (VENTOLIN HFA) 108 (90 Base) MCG/ACT inhaler TAKE 2 PUFFS BY MOUTH EVERY 6 HOURS AS NEEDED FOR WHEEZE OR SHORTNESS OF BREATH   aspirin EC 81 MG tablet Take 1 tablet (81 mg total) by mouth daily. Swallow whole.   B COMPLEX-C PO Take by mouth daily.   busPIRone (BUSPAR) 10 MG tablet Take 5 mg by mouth 2 (two) times daily.   Calcium Carb-Cholecalciferol (CALCIUM + D3) 600-200 MG-UNIT TABS Take 1 tablet by mouth 2 (two) times daily.   diphenhydrAMINE (BENADRYL) 25 MG tablet Take 25 mg by mouth every 6 (six) hours as needed.   doxycycline (VIBRA-TABS) 100 MG tablet Take 1 tablet (100 mg total) by mouth 2 (two) times daily.   EPINEPHrine (EPIPEN 2-PAK) 0.3 mg/0.3 mL IJ SOAJ injection Inject 0.3 mLs (0.3 mg total) into the skin as needed.   fluticasone (FLONASE) 50 MCG/ACT nasal spray Place 1 spray into both nostrils daily.   furosemide (LASIX) 20 MG tablet Take 20 mg by  mouth daily.   glucose blood (ONETOUCH VERIO) test strip Blood sugar testing done QD and as needed  E11.65   imipramine (TOFRANIL) 25 MG tablet TAKE 2 TABLETS AT BEDTIME   insulin degludec (TRESIBA FLEXTOUCH) 100 UNIT/ML FlexTouch Pen Inject 40 Units into the skin daily. Take in the evening, raise dosage by 2 units every 3 days until blood sugar is 130 or below.   Insulin Pen Needle (BD PEN NEEDLE NANO 2ND GEN) 32G X 4 MM MISC Use as directed to inject [name] Daily into skin E11.65   linaclotide (LINZESS) 290 MCG CAPS capsule Take 1 capsule (290 mcg total) by mouth daily before breakfast.   nadolol (CORGARD) 80 MG tablet Take 1 tablet (80 mg total) by mouth daily.   OZEMPIC, 1 MG/DOSE, 4 MG/3ML SOPN INJECT 1 MG UNDER THE SKIN ONCE A WEEK   pantoprazole (PROTONIX) 40 MG tablet Take 1 tablet (40 mg total) by mouth daily.   pravastatin (PRAVACHOL) 20 MG tablet Take 1 tablet (20 mg total) by mouth daily.   tamoxifen (NOLVADEX) 10 MG tablet Take 1 tablet (10 mg total) by mouth daily.   valsartan-hydrochlorothiazide (DIOVAN-HCT) 80-12.5 MG tablet Take 1 tablet by mouth daily.   [DISCONTINUED] benzonatate (TESSALON) 100 MG capsule Take 1 capsule (100 mg total) by mouth 2 (two) times daily as needed for cough.   [DISCONTINUED] BREO ELLIPTA 100-25 MCG/ACT AEPB USE 1  INHALATION DAILY   [DISCONTINUED] etodolac (LODINE) 400 MG tablet TAKE 1 TABLET TWICE A DAY AS NEEDED FOR PAIN (Patient taking differently: 2 (two) times daily.)   [DISCONTINUED] ivabradine (CORLANOR) 7.5 MG TABS tablet Take 15m TWO hours prior to your cardiac CT scan.   [DISCONTINUED] metoprolol tartrate (LOPRESSOR) 50 MG tablet Take 51mTWO hours prior to your cardiac CT scan. Please hold Nadolol.   [DISCONTINUED] predniSONE (DELTASONE) 10 MG tablet Use per dose pack   [DISCONTINUED] Semaglutide, 1 MG/DOSE, (OZEMPIC, 1 MG/DOSE,) 2 MG/1.5ML SOPN Inject 1 mg into the skin once a week.   No facility-administered encounter medications on file  as of 10/01/2021.    Surgical History: Past Surgical History:  Procedure Laterality Date   BREAST BIOPSY Right 2005   negative   BREAST BIOPSY Left 08/23/2007   negative   BREAST BIOPSY Left 10/24/2013   positive   BREAST BIOPSY Left 07/2002   neg   BREAST BIOPSY Left 08/23/2007   neg   BREAST SURGERY Left 10/2011   Cyst Aspirationapocrine metaplasia, ductal cells and bone cells, hypo-cellular   BREAST SURGERY Left 2009   ADH on stereotactic biopsy, 1.5 mm focus.   BREAST SURGERY Left 2003   fibrocystic changes with ductal hyperplasia without atypia.   BREAST SURGERY Left    mastectomy   BREAST SURGERY Left April 11, 2014   Removal of implant, debridement chest wall DrLamar COLONOSCOPY  08/26/2013   PaVerdie ShireM.D. normal.   CRYOABLATION  2005, 2010   CYST REMOVAL NECK  10/2011   Dr. McTami Ribas ESOPHAGOGASTRODUODENOSCOPY (EGD) WITH PROPOFOL N/A 01/16/2017   Procedure: ESOPHAGOGASTRODUODENOSCOPY (EGD) WITH PROPOFOL;  Surgeon: WoLucilla LameMD;  [facility];  Laterality: N/A;  Diabetic - oral meds   INCISION AND DRAINAGE Left Dec 2015, Feb 2016   Dr CoTula Nakayama MASTECTOMY Left 11/24/2013   positive   PORTACATH PLACEMENT  11/24/13    Medical History: Past Medical History:  Diagnosis Date   Arthritis 2006   Asthma    [facility]ast [facility] ([facility]   18 mm, T1c, N0; ER/ PR positive,her 2 neu overexpressed. Adjuvant chemo/ herceptin.   [facility]   Renal cell carcinoma; cryosurgery treatment, right side   [place] ([facility]   Collapsed lung 2007   Diabetes mellitus without complication ([facility] Metformin   Diffuse cystic mastopathy    Family history of breast cancer    Family history of liver cancer    Motion sickness    any moving vehicle   Orthodontics    braces   Personal history of chemotherapy    Sinus problem     Family History: Family History  Problem Relation  Age of Onset   Liver cancer Father    Hemochromatosis Father    Liver disease Father    Diabetes Mother    Hypertension Mother    Breast cancer Paternal Aunt [id number] Ovarian cancer Maternal Grandmother        unk primary, metastatic cancer   Hemachromatosis Daughter    Liver cancer Paternal Uncle    Hemochromatosis Paternal Uncle     Social History   Socioeconomic History   Marital status: Married    Spouse name: Not on file   Number of children: Not on file   Years of education: Not on file   Highest education level: Not on  file  Occupational History   Not on file  Tobacco Use   Smoking status: Never   Smokeless tobacco: Never  Vaping Use   Vaping Use: Never used  Substance and Sexual Activity   Alcohol use: Not Currently    Comment: occasionally, none recently   Drug use: No   Sexual activity: Not on file  Other Topics Concern   Not on file  Social History Narrative   Not on file   Social Determinants of Health   Financial Resource Strain: Not on file  Food Insecurity: Not on file  Transportation Needs: Not on file  Physical Activity: Not on file  Stress: Not on file  Social Connections: Not on file  Intimate Partner Violence: Not on file      Review of Systems  Constitutional:  Positive for fatigue. Negative for chills and unexpected weight change.  HENT:  Negative for congestion, postnasal drip, rhinorrhea, sneezing and sore throat.   Eyes:  Negative for redness.  Respiratory:  Negative for cough, chest tightness and shortness of breath.   Cardiovascular:  Negative for chest pain and palpitations.  Gastrointestinal:  Negative for abdominal pain, constipation, diarrhea, nausea and vomiting.  Genitourinary:  Negative for dysuria and frequency.  Musculoskeletal:  Negative for arthralgias, back pain, joint swelling and neck pain.  Skin:  Negative for rash.  Neurological:  Positive for weakness. Negative for tremors and numbness.  Hematological:  Negative  for adenopathy. Does not bruise/bleed easily.  Psychiatric/Behavioral:  Negative for behavioral problems (Depression), sleep disturbance and suicidal ideas. The patient is not nervous/anxious.    Vital Signs: BP 92/62   Pulse 83   Temp 98.2 F (36.8 C)   Resp 16   Ht 5' 6"  (1.676 m)   Wt 204 lb 12.8 oz (92.9 kg)   SpO2 96%   BMI 33.06 kg/m    Physical Exam Constitutional:      Appearance: Normal appearance.  HENT:     Head: Normocephalic and atraumatic.     Nose: Nose normal.     Mouth/Throat:     Mouth: Mucous membranes are moist.     Pharynx: No posterior oropharyngeal erythema.  Eyes:     Extraocular Movements: Extraocular movements intact.     Pupils: Pupils are equal, round, and reactive to light.  Cardiovascular:     Rate and Rhythm: Normal rate and regular rhythm.     Pulses: Normal pulses.     Heart sounds: Normal heart sounds.     Comments: Patient is positive for orthostatics here in the office Pulmonary:     Effort: Pulmonary effort is normal.     Breath sounds: Normal breath sounds.  Neurological:     General: No focal deficit present.     Mental Status: She is alert.  Psychiatric:        Mood and Affect: Mood normal.        Behavior: Behavior normal.    Assessment/Plan: 1. Hypotension due to drugs Patient weakness and diagnosis multifactorial patient gives symptoms of palpitation weakness at times Holter has been negative patient is on multiple medications to treat her symptoms including cirrhosis of the liver, to treat her portal hypertension -We will DC her Diovan/HCTZ. -We will DC her nadolol -Prescription of propranolol 10 mg 3 times daily as needed is given to the patient to be taken only for a heart rate of about 120 patient will also monitor her blood pressure at home -She will continue her Lasix as before  2. 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  Recent PFTs were done by Dr. Raul Del at [facility].  She was also seen by cardiology  3. Chronic liver disease and cirrhosis (HCC) Patient has cirrhosis of the liver due to chronic fatty liver disease, she does have portal hypertension and was on nadolol however patient is developing hypotension probably need to be on low-dose mild to medium acting beta-blockers nadolol is a longer acting medication even though it is an excellent choice however patient lifestyle and quality of life is really poor due to hypertension and we will reevaluate we will also get in touch with GI  4. Uncontrolled type 2 diabetes mellitus with hyperglycemia ([place]) Diabetes is under better control we will continue Ozempic as before, recent hemoglobin A1c is 6.8  General Counseling: Bridey verbalizes understanding of the findings of todays visit and agrees with plan of treatment. I have discussed any further diagnostic evaluation that may be needed or ordered today. We also reviewed her medications today. she has been encouraged to call the office with any questions or concerns that should arise related to todays visit.    Total time spent:35 Minutes Time spent includes review of chart, medications, test results, and follow up plan with the patient.   [place] Controlled Substance Database was reviewed by me.   Dr Lavera Guise Internal medicine

## 2021-10-02 ENCOUNTER — Encounter: Payer: Self-pay | Admitting: Internal Medicine

## 2021-10-02 ENCOUNTER — Other Ambulatory Visit: Payer: Self-pay

## 2021-10-02 DIAGNOSIS — R002 Palpitations: Secondary | ICD-10-CM | POA: Diagnosis not present

## 2021-10-02 DIAGNOSIS — R000 Tachycardia, unspecified: Secondary | ICD-10-CM | POA: Diagnosis not present

## 2021-10-02 MED ORDER — PROPRANOLOL HCL 10 MG PO TABS: 10.0000 mg | ORAL_TABLET | Freq: Three times a day (TID) | ORAL | 3 refills | Status: DC

## 2021-10-03 NOTE — Telephone Encounter (Signed)
Spoke with pt she like dr Humphrey Rolls to review Holter monitor done by dr Clayborn Bigness advised her to we are unable to open if they can fax [place] or she can bring her report at next visit

## 2021-10-14 DIAGNOSIS — R002 Palpitations: Secondary | ICD-10-CM | POA: Diagnosis not present

## 2021-10-14 DIAGNOSIS — R000 Tachycardia, unspecified: Secondary | ICD-10-CM | POA: Diagnosis not present

## 2021-10-14 DIAGNOSIS — I208 Other forms of angina pectoris: Secondary | ICD-10-CM | POA: Diagnosis not present

## 2021-10-14 DIAGNOSIS — R0602 Shortness of breath: Secondary | ICD-10-CM | POA: Diagnosis not present

## 2021-10-15 ENCOUNTER — Ambulatory Visit: Payer: BC Managed Care – PPO | Admitting: Internal Medicine

## 2021-10-15 ENCOUNTER — Telehealth: Payer: Self-pay

## 2021-10-15 ENCOUNTER — Encounter: Payer: Self-pay | Admitting: Internal Medicine

## 2021-10-15 VITALS — BP 141/75 | HR 86 | Temp 97.7°F | Resp 16 | Ht 66.0 in | Wt 209.2 lb

## 2021-10-15 DIAGNOSIS — K7460 Unspecified cirrhosis of liver: Secondary | ICD-10-CM | POA: Diagnosis not present

## 2021-10-15 DIAGNOSIS — K769 Liver disease, unspecified: Secondary | ICD-10-CM

## 2021-10-15 DIAGNOSIS — R0902 Hypoxemia: Secondary | ICD-10-CM

## 2021-10-15 DIAGNOSIS — J4541 Moderate persistent asthma with (acute) exacerbation: Secondary | ICD-10-CM

## 2021-10-15 DIAGNOSIS — R0602 Shortness of breath: Secondary | ICD-10-CM | POA: Diagnosis not present

## 2021-10-15 MED ORDER — SPIRONOLACTONE 25 MG PO TABS: 25.0000 mg | ORAL_TABLET | Freq: Two times a day (BID) | ORAL | 3 refills | Status: DC

## 2021-10-15 MED ORDER — BREZTRI AEROSPHERE 160-9-4.8 MCG/ACT IN AERO
2.0000 | INHALATION_SPRAY | Freq: Two times a day (BID) | RESPIRATORY_TRACT | 3 refills | Status: DC
Start: 2021-10-15 — End: 2022-11-18

## 2021-10-15 NOTE — Progress Notes (Unsigned)
[facility] [place], [place] [address]  Internal MEDICINE  Office Visit Note  Patient Name: Danielle Wallace  [id number]  [id number]  Date of Service: 10/29/2021  Chief Complaint  Patient presents with   Follow-up   Diabetes    HPI     Current Medication: Outpatient Encounter Medications as of 10/15/2021  Medication Sig   albuterol (VENTOLIN HFA) 108 (90 Base) MCG/ACT inhaler TAKE 2 PUFFS BY MOUTH EVERY 6 HOURS AS NEEDED FOR WHEEZE OR SHORTNESS OF BREATH   aspirin EC 81 MG tablet Take 1 tablet (81 mg total) by mouth daily. Swallow whole.   B COMPLEX-C PO Take by mouth daily.   Budeson-Glycopyrrol-Formoterol (BREZTRI AEROSPHERE) 160-9-4.8 MCG/ACT AERO Inhale 2 puffs into the lungs 2 (two) times daily. Pt needs 3 month supply   busPIRone (BUSPAR) 10 MG tablet Take 5 mg by mouth 2 (two) times daily.   Calcium Carb-Cholecalciferol (CALCIUM + D3) 600-200 MG-UNIT TABS Take 1 tablet by mouth 2 (two) times daily.   diphenhydrAMINE (BENADRYL) 25 MG tablet Take 25 mg by mouth every 6 (six) hours as needed.   EPINEPHrine (EPIPEN 2-PAK) 0.3 mg/0.3 mL IJ SOAJ injection Inject 0.3 mLs (0.3 mg total) into the skin as needed.   fluticasone (FLONASE) 50 MCG/ACT nasal spray Place 1 spray into both nostrils daily.   furosemide (LASIX) 20 MG tablet Take 20 mg by mouth daily.   glucose blood (ONETOUCH VERIO) test strip Blood sugar testing done QD and as needed  E11.65   imipramine (TOFRANIL) 25 MG tablet TAKE 2 TABLETS AT BEDTIME   insulin degludec (TRESIBA FLEXTOUCH) 100 UNIT/ML FlexTouch Pen Inject 40 Units into the skin daily. Take in the evening, raise dosage by 2 units every 3 days until blood sugar is 130 or below.   linaclotide (LINZESS) 290 MCG CAPS capsule Take 1 capsule (290 mcg total) by mouth daily before breakfast.   OZEMPIC, 1 MG/DOSE, 4 MG/3ML SOPN INJECT 1 MG UNDER THE SKIN ONCE A WEEK   pantoprazole (PROTONIX) 40 MG tablet Take 1 tablet (40 mg total) by mouth  daily.   pravastatin (PRAVACHOL) 20 MG tablet Take 1 tablet (20 mg total) by mouth daily.   propranolol (INDERAL) 10 MG tablet Take 1 tablet (10 mg total) by mouth 3 (three) times daily. If bp is greater than 140   tamoxifen (NOLVADEX) 10 MG tablet Take 1 tablet (10 mg total) by mouth daily.   [DISCONTINUED] Budeson-Glycopyrrol-Formoterol (BREZTRI AEROSPHERE) 160-9-4.8 MCG/ACT AERO Inhale into the lungs. Inhale 2 puff twice a day   [DISCONTINUED] doxycycline (VIBRA-TABS) 100 MG tablet Take 1 tablet (100 mg total) by mouth 2 (two) times daily.   [DISCONTINUED] Insulin Pen Needle (BD PEN NEEDLE NANO 2ND GEN) 32G X 4 MM MISC Use as directed to inject [name] Daily into skin E11.65   [DISCONTINUED] spironolactone (ALDACTONE) 25 MG tablet Take 1 tablet (25 mg total) by mouth 2 (two) times daily.   No facility-administered encounter medications on file as of 10/15/2021.    Surgical History: Past Surgical History:  Procedure Laterality Date   BREAST BIOPSY Right 2005   negative   BREAST BIOPSY Left 08/23/2007   negative   BREAST BIOPSY Left 10/24/2013   positive   BREAST BIOPSY Left 07/2002   neg   BREAST BIOPSY Left 08/23/2007   neg   BREAST SURGERY Left 10/2011   Cyst Aspirationapocrine metaplasia, ductal cells and bone cells, hypo-cellular   BREAST SURGERY Left 2009   ADH on  stereotactic biopsy, 1.5 mm focus.   BREAST SURGERY Left 2003   fibrocystic changes with ductal hyperplasia without atypia.   BREAST SURGERY Left    mastectomy   BREAST SURGERY Left April 11, 2014   Removal of implant, debridement chest wall [place]   COLONOSCOPY  08/26/2013   Verdie Shire, M.D. normal.   CRYOABLATION  2005, 2010   CYST REMOVAL NECK  10/2011   Dr. Tami Ribas   ESOPHAGOGASTRODUODENOSCOPY (EGD) WITH PROPOFOL N/A 01/16/2017   Procedure: ESOPHAGOGASTRODUODENOSCOPY (EGD) WITH PROPOFOL;  Surgeon: Lucilla Lame, MD;  Location: [place];  Service:  Gastroenterology;  Laterality: N/A;  Diabetic - oral meds   INCISION AND DRAINAGE Left Dec 2015, Feb 2016   Dr Tula Nakayama   MASTECTOMY Left 11/24/2013   positive   PORTACATH PLACEMENT  11/24/13    Medical History: Past Medical History:  Diagnosis Date   Arthritis 2006   Asthma    Breast cancer ([place]) 10/14/2013   18 mm, T1c, N0; ER/ PR positive,her 2 neu overexpressed. Adjuvant chemo/ herceptin.   Cancer [facility]) 2006   Renal cell carcinoma; cryosurgery treatment, right side   Cirrhosis ([place])    Collapsed lung 2007   Diabetes mellitus without complication ([place]) 7510   Metformin   Diffuse cystic mastopathy    Family history of breast cancer    Family history of liver cancer    Motion sickness    any moving vehicle   Orthodontics    braces   Personal history of chemotherapy    Sinus problem     Family History: Family History  Problem Relation Age of Onset   Liver cancer Father    Hemochromatosis Father    Liver disease Father    Diabetes Mother    Hypertension Mother    Breast cancer Paternal Aunt 31   Ovarian cancer Maternal Grandmother        unk primary, metastatic cancer   Hemachromatosis Daughter    Liver cancer Paternal Uncle    Hemochromatosis Paternal Uncle     Social History   Socioeconomic History   Marital status: Married    Spouse name: Not on file   Number of children: Not on file   Years of education: Not on file   Highest education level: Not on file  Occupational History   Not on file  Tobacco Use   Smoking status: Never   Smokeless tobacco: Never  Vaping Use   Vaping Use: Never used  Substance and Sexual Activity   Alcohol use: Not Currently    Comment: occasionally, none recently   Drug use: No   Sexual activity: Not on file  Other Topics Concern   Not on file  Social History Narrative   Not on file   Social Determinants of Health   Financial Resource Strain: Not on file  Food Insecurity: Not on file  Transportation Needs: Not on file   Physical Activity: Not on file  Stress: Not on file  Social Connections: Not on file  Intimate Partner Violence: Not on file      Review of Systems  Vital Signs: BP (!) 141/75   Pulse 86   Temp 97.7 F (36.5 C)   Resp 16   Ht _0  (1.676 m)   Wt 209 lb 3.2 oz (94.9 kg)   SpO2 99%   BMI 33.77 kg/m    Physical Exam     Assessment/Plan:   General  Counseling: tracy gerken understanding of the findings of todays visit and agrees with plan of treatment. I have discussed any further diagnostic evaluation that may be needed or ordered today. We also reviewed her medications today. she has been encouraged to call the office with any questions or concerns that should arise related to todays visit.    Orders Placed This Encounter  Procedures   For home use only DME oxygen   [id number]+EGFR    Meds ordered this encounter  Medications   Budeson-Glycopyrrol-Formoterol (BREZTRI AEROSPHERE) 160-9-4.8 MCG/ACT AERO    Sig: Inhale 2 puffs into the lungs 2 (two) times daily. Pt needs 3 month supply    Dispense:  10.7 g    Refill:  3   DISCONTD: spironolactone (ALDACTONE) 25 MG tablet    Sig: Take 1 tablet (25 mg total) by mouth 2 (two) times daily.    Dispense:  90 tablet    Refill:  3    Total time spent:45 Minutes Time spent includes review of chart, medications, test results, and follow up plan with the patient.   [place] Controlled Substance Database was reviewed by me.   Dr Lavera Guise Internal medicine

## 2021-10-15 NOTE — Telephone Encounter (Signed)
Sent order for oxygen to sarah at [facility] and sent message to Dane also

## 2021-10-16 ENCOUNTER — Encounter: Payer: Self-pay | Admitting: Internal Medicine

## 2021-10-17 ENCOUNTER — Encounter: Payer: Self-pay | Admitting: Internal Medicine

## 2021-10-17 ENCOUNTER — Other Ambulatory Visit: Payer: Self-pay

## 2021-10-17 MED ORDER — SPIRONOLACTONE 25 MG PO TABS: 25.0000 mg | ORAL_TABLET | Freq: Two times a day (BID) | ORAL | 0 refills | Status: DC

## 2021-10-18 ENCOUNTER — Other Ambulatory Visit: Payer: Self-pay

## 2021-10-18 MED ORDER — BD PEN NEEDLE NANO 2ND GEN 32G X 4 MM MISC: 3 refills | Status: DC

## 2021-10-22 ENCOUNTER — Telehealth: Payer: Self-pay

## 2021-10-22 ENCOUNTER — Ambulatory Visit: Payer: BC Managed Care – PPO | Admitting: Internal Medicine

## 2021-10-22 ENCOUNTER — Encounter: Payer: Self-pay | Admitting: Internal Medicine

## 2021-10-22 VITALS — BP 139/79 | HR 92 | Temp 98.1°F | Resp 16 | Ht 66.0 in | Wt 203.4 lb

## 2021-10-22 DIAGNOSIS — K7460 Unspecified cirrhosis of liver: Secondary | ICD-10-CM

## 2021-10-22 DIAGNOSIS — G479 Sleep disorder, unspecified: Secondary | ICD-10-CM

## 2021-10-22 DIAGNOSIS — K769 Liver disease, unspecified: Secondary | ICD-10-CM

## 2021-10-22 DIAGNOSIS — R0902 Hypoxemia: Secondary | ICD-10-CM | POA: Diagnosis not present

## 2021-10-22 DIAGNOSIS — R000 Tachycardia, unspecified: Secondary | ICD-10-CM

## 2021-10-22 NOTE — Telephone Encounter (Signed)
Awaiting 10/22/21 office notes for SS order-Toni

## 2021-10-22 NOTE — Progress Notes (Addendum)
[facility] [place], [place] [address]  Internal MEDICINE  Office Visit Note  Patient Name: Danielle Wallace  [id number]  [id number]  Date of Service: 10/22/2021  Chief Complaint  Patient presents with   Follow-up    No improvement since last week, using oxygen between 2-4x daily - gets SOB, tachycardia and light-headed   Diabetes   Quality Metric Gaps    Foot Exam    HPI Patient is here for follow-up after starting on oxygen therapy however patient has not been using her oxygen as recommended which is with ambulation at all times, her O2 saturations do drop to 86% when previous visit note started on 2 to 3 L of oxygen with ambulation. Patient is also having problem maintaining her sleep feels tired and sluggish during the day Recent cardiac cath was insignificant for any coronary artery disease however she continues to be sob, does have significant desaturations might need further evaluation for significant desaturations Patient has persistent symptoms of sob on exertion post-COVID Patient also has cirrhosis of the liver with esophageal varices and is followed by GI Pt has been on nadolol for esophageal varices and Diovan for BP control both were held on previous visit due to significant hypotension, since then patient has been tolerating low-dose Lasix and metolazone, she was also prescribed propranolol 10 mg as needed for episodes of palpitations and tachycardia, her tachycardia and palpitation might also be due to deconditioning/ hypoxia She continues to use Breztri  with good results Current Medication: Outpatient Encounter Medications as of 10/22/2021  Medication Sig   albuterol (VENTOLIN HFA) 108 (90 Base) MCG/ACT inhaler TAKE 2 PUFFS BY MOUTH EVERY 6 HOURS AS NEEDED FOR WHEEZE OR SHORTNESS OF BREATH   aspirin EC 81 MG tablet Take 1 tablet (81 mg total) by mouth daily. Swallow whole.   B COMPLEX-C PO Take by mouth daily.   Budeson-Glycopyrrol-Formoterol  (BREZTRI AEROSPHERE) 160-9-4.8 MCG/ACT AERO Inhale 2 puffs into the lungs 2 (two) times daily. Pt needs 3 month supply   busPIRone (BUSPAR) 10 MG tablet Take 5 mg by mouth 2 (two) times daily.   Calcium Carb-Cholecalciferol (CALCIUM + D3) 600-200 MG-UNIT TABS Take 1 tablet by mouth 2 (two) times daily.   diphenhydrAMINE (BENADRYL) 25 MG tablet Take 25 mg by mouth every 6 (six) hours as needed.   EPINEPHrine (EPIPEN 2-PAK) 0.3 mg/0.3 mL IJ SOAJ injection Inject 0.3 mLs (0.3 mg total) into the skin as needed.   fluticasone (FLONASE) 50 MCG/ACT nasal spray Place 1 spray into both nostrils daily.   furosemide (LASIX) 20 MG tablet Take 20 mg by mouth daily.   glucose blood (ONETOUCH VERIO) test strip Blood sugar testing done QD and as needed  E11.65   imipramine (TOFRANIL) 25 MG tablet TAKE 2 TABLETS AT BEDTIME   insulin degludec (TRESIBA FLEXTOUCH) 100 UNIT/ML FlexTouch Pen Inject 40 Units into the skin daily. Take in the evening, raise dosage by 2 units every 3 days until blood sugar is 130 or below.   Insulin Pen Needle (BD PEN NEEDLE NANO 2ND GEN) 32G X 4 MM MISC Use as directed to inject [name] Daily into skin E11.65   linaclotide (LINZESS) 290 MCG CAPS capsule Take 1 capsule (290 mcg total) by mouth daily before breakfast.   OZEMPIC, 1 MG/DOSE, 4 MG/3ML SOPN INJECT 1 MG UNDER THE SKIN ONCE A WEEK   pantoprazole (PROTONIX) 40 MG tablet Take 1 tablet (40 mg total) by mouth daily.   pravastatin (PRAVACHOL) 20  MG tablet Take 1 tablet (20 mg total) by mouth daily.   propranolol (INDERAL) 10 MG tablet Take 1 tablet (10 mg total) by mouth 3 (three) times daily. If bp is greater than 140   spironolactone (ALDACTONE) 25 MG tablet Take 1 tablet (25 mg total) by mouth 2 (two) times daily.   tamoxifen (NOLVADEX) 10 MG tablet Take 1 tablet (10 mg total) by mouth daily.   [DISCONTINUED] doxycycline (VIBRA-TABS) 100 MG tablet Take 1 tablet (100 mg total) by mouth 2 (two) times daily.   No  facility-administered encounter medications on file as of 10/22/2021.    Surgical History: Past Surgical History:  Procedure Laterality Date   BREAST BIOPSY Right 2005   negative   BREAST BIOPSY Left 08/23/2007   negative   BREAST BIOPSY Left 10/24/2013   positive   BREAST BIOPSY Left 07/2002   neg   BREAST BIOPSY Left 08/23/2007   neg   BREAST SURGERY Left 10/2011   Cyst Aspirationapocrine metaplasia, ductal cells and bone cells, hypo-cellular   BREAST SURGERY Left 2009   ADH on stereotactic biopsy, 1.5 mm focus.   BREAST SURGERY Left 2003   fibrocystic changes with ductal hyperplasia without atypia.   BREAST SURGERY Left    mastectomy   BREAST SURGERY Left April 11, 2014   Removal of implant, debridement chest wall [place]   COLONOSCOPY  08/26/2013   Verdie Shire, M.D. normal.   CRYOABLATION  2005, 2010   CYST REMOVAL NECK  10/2011   Dr. Tami Ribas   ESOPHAGOGASTRODUODENOSCOPY (EGD) WITH PROPOFOL N/A 01/16/2017   Procedure: ESOPHAGOGASTRODUODENOSCOPY (EGD) WITH PROPOFOL;  Surgeon: Lucilla Lame, MD;  Location: [facility];  Service: Gastroenterology;  Laterality: N/A;  Diabetic - oral meds   INCISION AND DRAINAGE Left Dec 2015, Feb 2016   Dr Tula Nakayama   MASTECTOMY Left 11/24/2013   positive   PORTACATH PLACEMENT  11/24/13    Medical History: Past Medical History:  Diagnosis Date   Arthritis 2006   Asthma    Breast cancer ([place]) 10/14/2013   18 mm, T1c, N0; ER/ PR positive,her 2 neu overexpressed. Adjuvant chemo/ herceptin.   Cancer [facility]) 2006   Renal cell carcinoma; cryosurgery treatment, right side   Cirrhosis ([place])    Collapsed lung 2007   Diabetes mellitus without complication ([place]) 2620   Metformin   Diffuse cystic mastopathy    Family history of breast cancer    Family history of liver cancer    Motion sickness    any moving vehicle   Orthodontics    braces   Personal history of chemotherapy    Sinus problem      Family History: Family History  Problem Relation Age of Onset   Liver cancer Father    Hemochromatosis Father    Liver disease Father    Diabetes Mother    Hypertension Mother    Breast cancer Paternal Aunt 52   Ovarian cancer Maternal Grandmother        unk primary, metastatic cancer   Hemachromatosis Daughter    Liver cancer Paternal Uncle    Hemochromatosis Paternal Uncle     Social History   Socioeconomic History   Marital status: Married    Spouse name: Not on file   Number of children: Not on file   Years of education: Not on file   Highest education level: Not on file  Occupational History   Not on file  Tobacco Use   Smoking status: Never   Smokeless tobacco: Never  Vaping Use   Vaping Use: Never used  Substance and Sexual Activity   Alcohol use: Not Currently    Comment: occasionally, none recently   Drug use: No   Sexual activity: Not on file  Other Topics Concern   Not on file  Social History Narrative   Not on file   Social Determinants of Health   Financial Resource Strain: Not on file  Food Insecurity: Not on file  Transportation Needs: Not on file  Physical Activity: Not on file  Stress: Not on file  Social Connections: Not on file  Intimate Partner Violence: Not on file      Review of Systems  Constitutional:  Negative for chills, fatigue and unexpected weight change.  HENT:  Negative for congestion, postnasal drip, rhinorrhea, sneezing and sore throat.   Eyes:  Negative for redness.  Respiratory:  Positive for shortness of breath. Negative for cough and chest tightness.   Cardiovascular:  Negative for chest pain and palpitations.  Gastrointestinal:  Negative for abdominal pain, constipation, diarrhea, nausea and vomiting.  Genitourinary:  Negative for dysuria and frequency.  Musculoskeletal:  Negative for arthralgias, back pain, joint swelling and neck pain.  Skin:  Negative for rash.  Neurological: Negative.  Negative for tremors  and numbness.  Hematological:  Negative for adenopathy. Does not bruise/bleed easily.  Psychiatric/Behavioral:  Negative for behavioral problems (Depression), sleep disturbance and suicidal ideas. The patient is not nervous/anxious.     Vital Signs: BP 139/79   Pulse 92   Temp 98.1 F (36.7 C)   Resp 16   Ht 5' 6"  (1.676 m)   Wt 203 lb 6.4 oz (92.3 kg)   SpO2 99%   BMI 32.83 kg/m    Physical Exam Constitutional:      Appearance: Normal appearance.  HENT:     Head: Normocephalic and atraumatic.     Nose: Nose normal.     Mouth/Throat:     Mouth: Mucous membranes are moist.     Pharynx: No posterior oropharyngeal erythema.  Eyes:     Extraocular Movements: Extraocular movements intact.     Pupils: Pupils are equal, round, and reactive to light.  Cardiovascular:     Pulses: Normal pulses.     Heart sounds: Normal heart sounds.  Pulmonary:     Effort: Pulmonary effort is normal.     Breath sounds: Normal breath sounds.  Neurological:     General: No focal deficit present.     Mental Status: She is alert.  Psychiatric:        Mood and Affect: Mood normal.        Behavior: Behavior normal.        Assessment/Plan: 1. Sleep disturbance Patient has significant hypoxia with ambulation and sleep disturbance at night she will get benefit from sleep study we will order 1 today - PSG Sleep Study; Future  2. Hypoxemia We will continue on on 2 to 3 L oxygen with ambulation at all time patient does have a lightweight shoulder bag however will get benefit from [facility] since she likes to stay active after her pulmonary evaluation patient might need pulmonary rehab, patient will also need updating her CT chest which was abnormal and will see pulmonary after sleep study is done - PSG Sleep Study; Future  3. Chronic liver disease and cirrhosis (HCC) We will increase her Lasix to 20 mg once a day Monday through Friday and  skip on Saturday and Sunday We will also increase her  spironolactone to 25 mg twice a day  CMP is ordered  4. Tachycardia Continue take propranolol as needed, if she is able to tolerate low-dose twice a day we might switch her to low-dose Cardizem or nadolol as tolerated by the patient  General Counseling: Loleta Chance understanding of the findings of todays visit and agrees with plan of treatment. I have discussed any further diagnostic evaluation that may be needed or ordered today. We also reviewed her medications today. she has been encouraged to call the office with any questions or concerns that should arise related to todays visit.    Orders Placed This Encounter  Procedures   PSG Sleep Study      Total time spent:35 Minutes Time spent includes review of chart, medications, test results, and follow up plan with the patient.   [place] Controlled Substance Database was reviewed by me.   Dr Lavera Guise Internal medicine

## 2021-10-23 DIAGNOSIS — H25042 Posterior subcapsular polar age-related cataract, left eye: Secondary | ICD-10-CM | POA: Diagnosis not present

## 2021-10-23 DIAGNOSIS — H524 Presbyopia: Secondary | ICD-10-CM | POA: Diagnosis not present

## 2021-10-23 DIAGNOSIS — E119 Type 2 diabetes mellitus without complications: Secondary | ICD-10-CM | POA: Diagnosis not present

## 2021-10-23 NOTE — Telephone Encounter (Signed)
SS order placed in [facility] folder-Toni 

## 2021-10-28 ENCOUNTER — Telehealth: Payer: Self-pay

## 2021-10-28 NOTE — Telephone Encounter (Signed)
Patient scheduled for PSG on 10/29/21 @ [facility].tat

## 2021-10-29 ENCOUNTER — Ambulatory Visit: Payer: BC Managed Care – PPO | Admitting: Internal Medicine

## 2021-10-29 ENCOUNTER — Encounter: Payer: Self-pay | Admitting: Internal Medicine

## 2021-10-29 ENCOUNTER — Encounter (INDEPENDENT_AMBULATORY_CARE_PROVIDER_SITE_OTHER): Payer: BC Managed Care – PPO | Admitting: Internal Medicine

## 2021-10-29 DIAGNOSIS — G4719 Other hypersomnia: Secondary | ICD-10-CM | POA: Diagnosis not present

## 2021-10-30 DIAGNOSIS — K769 Liver disease, unspecified: Secondary | ICD-10-CM | POA: Diagnosis not present

## 2021-10-30 DIAGNOSIS — K7460 Unspecified cirrhosis of liver: Secondary | ICD-10-CM | POA: Diagnosis not present

## 2021-10-31 ENCOUNTER — Encounter: Payer: Self-pay | Admitting: Nurse Practitioner

## 2021-10-31 ENCOUNTER — Ambulatory Visit (INDEPENDENT_AMBULATORY_CARE_PROVIDER_SITE_OTHER): Payer: BC Managed Care – PPO | Admitting: Nurse Practitioner

## 2021-10-31 VITALS — BP 128/78 | HR 102 | Temp 98.1°F | Resp 16 | Ht 66.0 in | Wt 200.8 lb

## 2021-10-31 DIAGNOSIS — K7460 Unspecified cirrhosis of liver: Secondary | ICD-10-CM | POA: Diagnosis not present

## 2021-10-31 DIAGNOSIS — E1165 Type 2 diabetes mellitus with hyperglycemia: Secondary | ICD-10-CM

## 2021-10-31 DIAGNOSIS — Z794 Long term (current) use of insulin: Secondary | ICD-10-CM

## 2021-10-31 DIAGNOSIS — G4733 Obstructive sleep apnea (adult) (pediatric): Secondary | ICD-10-CM | POA: Diagnosis not present

## 2021-10-31 DIAGNOSIS — J449 Chronic obstructive pulmonary disease, unspecified: Secondary | ICD-10-CM

## 2021-10-31 DIAGNOSIS — I10 Essential (primary) hypertension: Secondary | ICD-10-CM

## 2021-10-31 DIAGNOSIS — K769 Liver disease, unspecified: Secondary | ICD-10-CM

## 2021-10-31 DIAGNOSIS — J4520 Mild intermittent asthma, uncomplicated: Secondary | ICD-10-CM

## 2021-10-31 LAB — POCT GLYCOSYLATED HEMOGLOBIN (HGB A1C): Hemoglobin A1C: 6.5 % — AB (ref 4.0–5.6)

## 2021-10-31 LAB — CMP14+EGFR
ALT: 60 IU/L — ABNORMAL HIGH (ref 0–32)
AST: 72 IU/L — ABNORMAL HIGH (ref 0–40)
Albumin/Globulin Ratio: 1.5 (ref 1.2–2.2)
Albumin: 4 g/dL (ref 3.8–4.8)
Alkaline Phosphatase: 103 IU/L (ref 44–121)
BUN/Creatinine Ratio: 24 (ref 12–28)
BUN: 17 mg/dL (ref 8–27)
Bilirubin Total: 0.6 mg/dL (ref 0.0–1.2)
CO2: 25 mmol/L (ref 20–29)
Calcium: 9 mg/dL (ref 8.7–10.3)
Chloride: 99 mmol/L (ref 96–106)
Creatinine, Ser: 0.71 mg/dL (ref 0.57–1.00)
Globulin, Total: 2.7 g/dL (ref 1.5–4.5)
Glucose: 144 mg/dL — ABNORMAL HIGH (ref 70–99)
Potassium: 4.1 mmol/L (ref 3.5–5.2)
Sodium: 138 mmol/L (ref 134–144)
Total Protein: 6.7 g/dL (ref 6.0–8.5)
eGFR: 96 mL/min/{1.73_m2} (ref >59)

## 2021-10-31 NOTE — Progress Notes (Signed)
[facility] [place], [place] [address]  Internal MEDICINE  Office Visit Note  Patient Name: Danielle Wallace  [id number]  [id number]  Date of Service: 10/31/2021  Chief Complaint  Patient presents with   Follow-up    Woke up with a headache, SOB today even with oxygen, some abd cramping off and on ober the last 4 days    Headache    Neck and back of the head hurt    Results    SS done on Tuesday night, labs done yesterday   Diabetes   Asthma   Dizziness    Light headed    HPI Tamaria presents for a follow up visit for lab and sleep study results, diabetes, headache and dizziness.  --woke up with headache, SOB even w/O2 today, abdominal cramping off and on for past 4 days.  --headache-- neck and back of head are throbbing, pain is 8 out of 10.  --diarrhea x1 month, bloating, abdominal pain, flatulence and burping. Has appt with GI in July.   --Nodules of lungs, repeat ct chest in October.  --diabetes --A1c is 6.5, improving,  was 6.8 --Liver enzymes are elevated, glucose is 144 on cmp. Kidney function is normal.  --Sleep study results: This routine overnight polysomnogram demonstrated significant obstructive sleep apnea with an overall Apnea Hypopnea Index of 8.2 apneas and hypopneas per hour of sleep. The respiratory events were associated with peripheral oxygen desaturations on the average to 82% with the lowest desaturation to 69%.    Current Medication: Outpatient Encounter Medications as of 10/31/2021  Medication Sig   albuterol (VENTOLIN HFA) 108 (90 Base) MCG/ACT inhaler TAKE 2 PUFFS BY MOUTH EVERY 6 HOURS AS NEEDED FOR WHEEZE OR SHORTNESS OF BREATH   aspirin EC 81 MG tablet Take 1 tablet (81 mg total) by mouth daily. Swallow whole.   B COMPLEX-C PO Take by mouth daily.   Budeson-Glycopyrrol-Formoterol (BREZTRI AEROSPHERE) 160-9-4.8 MCG/ACT AERO Inhale 2 puffs into the lungs 2 (two) times daily. Pt needs 3 month supply   busPIRone (BUSPAR) 10  MG tablet Take 5 mg by mouth 2 (two) times daily.   Calcium Carb-Cholecalciferol (CALCIUM + D3) 600-200 MG-UNIT TABS Take 1 tablet by mouth 2 (two) times daily.   diphenhydrAMINE (BENADRYL) 25 MG tablet Take 25 mg by mouth every 6 (six) hours as needed.   EPINEPHrine (EPIPEN 2-PAK) 0.3 mg/0.3 mL IJ SOAJ injection Inject 0.3 mLs (0.3 mg total) into the skin as needed.   fluticasone (FLONASE) 50 MCG/ACT nasal spray Place 1 spray into both nostrils daily.   furosemide (LASIX) 20 MG tablet Take 20 mg by mouth daily.   glucose blood (ONETOUCH VERIO) test strip Blood sugar testing done QD and as needed  E11.65   imipramine (TOFRANIL) 25 MG tablet TAKE 2 TABLETS AT BEDTIME   insulin degludec (TRESIBA FLEXTOUCH) 100 UNIT/ML FlexTouch Pen Inject 40 Units into the skin daily. Take in the evening, raise dosage by 2 units every 3 days until blood sugar is 130 or below.   Insulin Pen Needle (BD PEN NEEDLE NANO 2ND GEN) 32G X 4 MM MISC Use as directed to inject [name] Daily into skin E11.65   linaclotide (LINZESS) 290 MCG CAPS capsule Take 1 capsule (290 mcg total) by mouth daily before breakfast.   OZEMPIC, 1 MG/DOSE, 4 MG/3ML SOPN INJECT 1 MG UNDER THE SKIN ONCE A WEEK   pantoprazole (PROTONIX) 40 MG tablet Take 1 tablet (40 mg total) by mouth daily.   pravastatin (  PRAVACHOL) 20 MG tablet Take 1 tablet (20 mg total) by mouth daily.   propranolol (INDERAL) 10 MG tablet Take 1 tablet (10 mg total) by mouth 3 (three) times daily. If bp is greater than 140   spironolactone (ALDACTONE) 25 MG tablet Take 1 tablet (25 mg total) by mouth 2 (two) times daily.   tamoxifen (NOLVADEX) 10 MG tablet Take 1 tablet (10 mg total) by mouth daily.   No facility-administered encounter medications on file as of 10/31/2021.    Surgical History: Past Surgical History:  Procedure Laterality Date   BREAST BIOPSY Right 2005   negative   BREAST BIOPSY Left 08/23/2007   negative   BREAST BIOPSY Left 10/24/2013   positive    BREAST BIOPSY Left 07/2002   neg   BREAST BIOPSY Left 08/23/2007   neg   BREAST SURGERY Left 10/2011   Cyst Aspirationapocrine metaplasia, ductal cells and bone cells, hypo-cellular   BREAST SURGERY Left 2009   ADH on stereotactic biopsy, 1.5 mm focus.   BREAST SURGERY Left 2003   fibrocystic changes with ductal hyperplasia without atypia.   BREAST SURGERY Left    mastectomy   BREAST SURGERY Left April 11, 2014   Removal of implant, debridement chest wall [place]   COLONOSCOPY  08/26/2013   Verdie Shire, M.D. normal.   CRYOABLATION  2005, 2010   CYST REMOVAL NECK  10/2011   Dr. Tami Ribas   ESOPHAGOGASTRODUODENOSCOPY (EGD) WITH PROPOFOL N/A 01/16/2017   Procedure: ESOPHAGOGASTRODUODENOSCOPY (EGD) WITH PROPOFOL;  Surgeon: Lucilla Lame, MD;  Location: [facility];  Service: Gastroenterology;  Laterality: N/A;  Diabetic - oral meds   INCISION AND DRAINAGE Left Dec 2015, Feb 2016   Dr Tula Nakayama   MASTECTOMY Left 11/24/2013   positive   PORTACATH PLACEMENT  11/24/13    Medical History: Past Medical History:  Diagnosis Date   Arthritis 2006   Asthma    Breast cancer ([place]) 10/14/2013   18 mm, T1c, N0; ER/ PR positive,her 2 neu overexpressed. Adjuvant chemo/ herceptin.   Cancer [facility]) 2006   Renal cell carcinoma; cryosurgery treatment, right side   Cirrhosis ([place])    Collapsed lung 2007   Diabetes mellitus without complication ([place]) 1093   Metformin   Diffuse cystic mastopathy    Family history of breast cancer    Family history of liver cancer    Motion sickness    any moving vehicle   Orthodontics    braces   Personal history of chemotherapy    Sinus problem     Family History: Family History  Problem Relation Age of Onset   Liver cancer Father    Hemochromatosis Father    Liver disease Father    Diabetes Mother    Hypertension Mother    Breast cancer Paternal Aunt 80   Ovarian cancer Maternal Grandmother        unk  primary, metastatic cancer   Hemachromatosis Daughter    Liver cancer Paternal Uncle    Hemochromatosis Paternal Uncle     Social History   Socioeconomic History   Marital status: Married    Spouse name: Not on file   Number of children: Not on file   Years of education: Not on file   Highest education level: Not on file  Occupational History   Not on file  Tobacco Use   Smoking status: Never   Smokeless tobacco: Never  Vaping Use   Vaping  Use: Never used  Substance and Sexual Activity   Alcohol use: Not Currently    Comment: occasionally, none recently   Drug use: No   Sexual activity: Not on file  Other Topics Concern   Not on file  Social History Narrative   Not on file   Social Determinants of Health   Financial Resource Strain: Not on file  Food Insecurity: Not on file  Transportation Needs: Not on file  Physical Activity: Not on file  Stress: Not on file  Social Connections: Not on file  Intimate Partner Violence: Not on file      Review of Systems  Constitutional:  Positive for fatigue. Negative for chills and unexpected weight change.  HENT:  Negative for congestion, rhinorrhea, sneezing and sore throat.   Eyes:  Negative for redness.  Respiratory:  Positive for shortness of breath. Negative for cough, chest tightness and wheezing.   Cardiovascular:  Negative for chest pain and palpitations.  Gastrointestinal:  Negative for abdominal pain, constipation, diarrhea, nausea and vomiting.  Genitourinary:  Negative for dysuria and frequency.  Musculoskeletal:  Negative for arthralgias, back pain, joint swelling and neck pain.  Skin:  Negative for rash.  Neurological:  Positive for headaches. Negative for tremors and numbness.  Hematological:  Negative for adenopathy. Does not bruise/bleed easily.  Psychiatric/Behavioral:  Negative for behavioral problems (Depression), sleep disturbance and suicidal ideas. The patient is not nervous/anxious.     Vital  Signs: BP 128/78   Pulse (!) 102   Temp 98.1 F (36.7 C)   Resp 16   Ht 5' 6"  (1.676 m)   Wt 200 lb 12.8 oz (91.1 kg)   SpO2 99% Comment: with 3L oxygen  BMI 32.41 kg/m    Physical Exam Vitals reviewed.  Constitutional:      General: She is not in acute distress.    Appearance: Normal appearance. She is obese. She is not ill-appearing.  HENT:     Head: Normocephalic and atraumatic.  Eyes:     Pupils: Pupils are equal, round, and reactive to light.  Cardiovascular:     Rate and Rhythm: Normal rate and regular rhythm.  Pulmonary:     Effort: Pulmonary effort is normal. No respiratory distress.  Neurological:     Mental Status: She is alert and oriented to person, place, and time.  Psychiatric:        Mood and Affect: Mood normal.        Behavior: Behavior normal.        Assessment/Plan: 1. Type 2 diabetes mellitus with hyperglycemia, with long-term current use of insulin (HCC) A1c stable at 6.5, no changes in medications at this time.  - POCT HgB A1C  2. Essential hypertension Stable and controlled with current medications.   3. Chronic liver disease and cirrhosis Elevated liver enzymes, is being followed by GI,   4. OSA (obstructive sleep apnea) OSA confirmed with sleep study, DSK following, will have cpap titration done. Upcoming appt with Dr. Devona Konig on 6/26  5. 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    General Counseling: fred hammes understanding of the findings of todays visit and agrees with plan of treatment. I have discussed any further diagnostic evaluation that may be needed or ordered today. We also reviewed her medications today. she has been encouraged to call the office with any questions or concerns that should arise related to todays visit.  Orders Placed This Encounter  Procedures   POCT HgB A1C    No orders of the  defined types were placed in this encounter.   Return for F/U 1 month with DFK and also need appt with DSK for sleep study results and pulm .   Total time spent:30 Minutes Time spent includes review of chart, medications, test results, and follow up plan with the patient.   [place] Controlled Substance Database was reviewed by me.  This patient was seen by Jonetta Osgood, FNP-C in collaboration with Dr. Clayborn Bigness as a part of collaborative care agreement.   Latissa Frick R. Valetta Fuller, MSN, FNP-C Internal medicine

## 2021-11-04 ENCOUNTER — Telehealth: Payer: Self-pay

## 2021-11-04 ENCOUNTER — Ambulatory Visit: Payer: BC Managed Care – PPO | Admitting: Internal Medicine

## 2021-11-04 ENCOUNTER — Other Ambulatory Visit: Payer: Self-pay

## 2021-11-04 ENCOUNTER — Encounter: Payer: Self-pay | Admitting: Internal Medicine

## 2021-11-04 VITALS — BP 129/69 | HR 95 | Temp 98.2°F | Resp 16 | Ht 66.0 in | Wt 204.6 lb

## 2021-11-04 DIAGNOSIS — R0602 Shortness of breath: Secondary | ICD-10-CM

## 2021-11-04 DIAGNOSIS — G4733 Obstructive sleep apnea (adult) (pediatric): Secondary | ICD-10-CM | POA: Diagnosis not present

## 2021-11-04 DIAGNOSIS — J449 Chronic obstructive pulmonary disease, unspecified: Secondary | ICD-10-CM

## 2021-11-04 MED ORDER — BD PEN NEEDLE NANO 2ND GEN 32G X 4 MM MISC: 3 refills | Status: DC

## 2021-11-04 NOTE — Telephone Encounter (Signed)
CPAP titration ordered. Printed. Put in Des[facility]'s folder-Toni

## 2021-11-04 NOTE — Progress Notes (Signed)
[facility] [place], [place] [address]  Pulmonary Sleep Medicine   Office Visit Note  Patient Name: Danielle Wallace DOB: [birth year] MRN [id number]  Date of Service: 11/04/2021  Complaints/HPI: She had a sleep study done which shows MILD OSA but she had desaturations down to 69%. We will need to get a follow up CPAP titration for assessment of optimal pressure settings.  My concern is that even though she has mild obstructive sleep apnea she has significant severe oxygen desaturation so therefore she will probably benefit from CPAP therapy.  In addition if needed she will also need to be placed on change of CPAP by itself control her desaturations along with her obstructive sleep apnea.  ROS  General: (-) fever, (-) chills, (-) night sweats, (-) weakness Skin: (-) rashes, (-) itching,. Eyes: (-) visual changes, (-) redness, (-) itching. Nose and Sinuses: (-) nasal stuffiness or itchiness, (-) postnasal drip, (-) nosebleeds, (-) sinus trouble. Mouth and Throat: (-) sore throat, (-) hoarseness. Neck: (-) swollen glands, (-) enlarged thyroid, (-) neck pain. Respiratory: + cough, (-) bloody sputum, + shortness of breath, - wheezing. Cardiovascular: - ankle swelling, (-) chest pain. Lymphatic: (-) lymph node enlargement. Neurologic: (-) numbness, (-) tingling. Psychiatric: (-) anxiety, (-) depression   Current Medication: Outpatient Encounter Medications as of 11/04/2021  Medication Sig   albuterol (VENTOLIN HFA) 108 (90 Base) MCG/ACT inhaler TAKE 2 PUFFS BY MOUTH EVERY 6 HOURS AS NEEDED FOR WHEEZE OR SHORTNESS OF BREATH   aspirin EC 81 MG tablet Take 1 tablet (81 mg total) by mouth daily. Swallow whole.   B COMPLEX-C PO Take by mouth daily.   Budeson-Glycopyrrol-Formoterol (BREZTRI AEROSPHERE) 160-9-4.8 MCG/ACT AERO Inhale 2 puffs into the lungs 2 (two) times daily. Pt needs 3 month supply   busPIRone (BUSPAR) 10 MG tablet Take 5 mg by mouth 2 (two) times  daily.   Calcium Carb-Cholecalciferol (CALCIUM + D3) 600-200 MG-UNIT TABS Take 1 tablet by mouth 2 (two) times daily.   diphenhydrAMINE (BENADRYL) 25 MG tablet Take 25 mg by mouth every 6 (six) hours as needed.   EPINEPHrine (EPIPEN 2-PAK) 0.3 mg/0.3 mL IJ SOAJ injection Inject 0.3 mLs (0.3 mg total) into the skin as needed.   fluticasone (FLONASE) 50 MCG/ACT nasal spray Place 1 spray into both nostrils daily.   furosemide (LASIX) 20 MG tablet Take 20 mg by mouth daily.   glucose blood (ONETOUCH VERIO) test strip Blood sugar testing done QD and as needed  E11.65   imipramine (TOFRANIL) 25 MG tablet TAKE 2 TABLETS AT BEDTIME   insulin degludec (TRESIBA FLEXTOUCH) 100 UNIT/ML FlexTouch Pen Inject 40 Units into the skin daily. Take in the evening, raise dosage by 2 units every 3 days until blood sugar is 130 or below.   Insulin Pen Needle (BD PEN NEEDLE NANO 2ND GEN) 32G X 4 MM MISC Use as directed to inject [name] Daily into skin E11.65   linaclotide (LINZESS) 290 MCG CAPS capsule Take 1 capsule (290 mcg total) by mouth daily before breakfast.   OZEMPIC, 1 MG/DOSE, 4 MG/3ML SOPN INJECT 1 MG UNDER THE SKIN ONCE A WEEK   pantoprazole (PROTONIX) 40 MG tablet Take 1 tablet (40 mg total) by mouth daily.   pravastatin (PRAVACHOL) 20 MG tablet Take 1 tablet (20 mg total) by mouth daily.   propranolol (INDERAL) 10 MG tablet Take 1 tablet (10 mg total) by mouth 3 (three) times daily. If bp is greater than 140   spironolactone (ALDACTONE)  25 MG tablet Take 1 tablet (25 mg total) by mouth 2 (two) times daily.   tamoxifen (NOLVADEX) 10 MG tablet Take 1 tablet (10 mg total) by mouth daily.   No facility-administered encounter medications on file as of 11/04/2021.    Surgical History: Past Surgical History:  Procedure Laterality Date   BREAST BIOPSY Right 2005   negative   BREAST BIOPSY Left 08/23/2007   negative   BREAST BIOPSY Left 10/24/2013   positive   BREAST BIOPSY Left 07/2002   neg   BREAST  BIOPSY Left 08/23/2007   neg   BREAST SURGERY Left 10/2011   Cyst Aspirationapocrine metaplasia, ductal cells and bone cells, hypo-cellular   BREAST SURGERY Left 2009   ADH on stereotactic biopsy, 1.5 mm focus.   BREAST SURGERY Left 2003   fibrocystic changes with ductal hyperplasia without atypia.   BREAST SURGERY Left    mastectomy   BREAST SURGERY Left April 11, 2014   Removal of implant, debridement chest wall [place]   COLONOSCOPY  08/26/2013   Verdie Shire, M.D. normal.   CRYOABLATION  2005, 2010   CYST REMOVAL NECK  10/2011   Dr. Tami Ribas   ESOPHAGOGASTRODUODENOSCOPY (EGD) WITH PROPOFOL N/A 01/16/2017   Procedure: ESOPHAGOGASTRODUODENOSCOPY (EGD) WITH PROPOFOL;  Surgeon: Lucilla Lame, MD;  Location: [place];  Service: Gastroenterology;  Laterality: N/A;  Diabetic - oral meds   INCISION AND DRAINAGE Left Dec 2015, Feb 2016   Dr Tula Nakayama   MASTECTOMY Left 11/24/2013   positive   PORTACATH PLACEMENT  11/24/13    Medical History: Past Medical History:  Diagnosis Date   Arthritis 2006   Asthma    Breast cancer ([place]) 10/14/2013   18 mm, T1c, N0; ER/ PR positive,her 2 neu overexpressed. Adjuvant chemo/ herceptin.   Cancer [facility]) 2006   Renal cell carcinoma; cryosurgery treatment, right side   Cirrhosis ([place])    Collapsed lung 2007   Diabetes mellitus without complication ([place]) 6294   Metformin   Diffuse cystic mastopathy    Family history of breast cancer    Family history of liver cancer    Motion sickness    any moving vehicle   Orthodontics    braces   Personal history of chemotherapy    Sinus problem     Family History: Family History  Problem Relation Age of Onset   Liver cancer Father    Hemochromatosis Father    Liver disease Father    Diabetes Mother    Hypertension Mother    Breast cancer Paternal Aunt 64   Ovarian cancer Maternal Grandmother        unk primary, metastatic cancer   Hemachromatosis  Daughter    Liver cancer Paternal Uncle    Hemochromatosis Paternal Uncle     Social History: Social History   Socioeconomic History   Marital status: Married    Spouse name: Not on file   Number of children: Not on file   Years of education: Not on file   Highest education level: Not on file  Occupational History   Not on file  Tobacco Use   Smoking status: Never   Smokeless tobacco: Never  Vaping Use   Vaping Use: Never used  Substance and Sexual Activity   Alcohol use: Not Currently    Comment: occasionally, none recently   Drug use: No   Sexual activity: Not on file  Other Topics Concern   Not  on file  Social History Narrative   Not on file   Social Determinants of Health   Financial Resource Strain: Not on file  Food Insecurity: Not on file  Transportation Needs: Not on file  Physical Activity: Not on file  Stress: Not on file  Social Connections: Not on file  Intimate Partner Violence: Not on file    Vital Signs: Blood pressure 129/69, pulse 95, temperature 98.2 F (36.8 C), resp. rate 16, height 5' 6"  (1.676 m), weight 204 lb 9.6 oz (92.8 kg), SpO2 98 %.  Examination: General Appearance: The patient is well-developed, well-nourished, and in no distress. Skin: Gross inspection of skin unremarkable. Head: normocephalic, no gross deformities. Eyes: no gross deformities noted. ENT: ears appear grossly normal no exudates. Neck: Supple. No thyromegaly. No LAD. Respiratory: no rhonchi. Cardiovascular: Normal S1 and S2 without murmur or rub. Extremities: No cyanosis. pulses are equal. Neurologic: Alert and oriented. No involuntary movements.  LABS: Recent Results (from the past 2160 hour(s))  Comprehensive metabolic panel     Status: Abnormal   Collection Time: 09/02/21  9:38 AM  Result Value Ref Range   Sodium 136 135 - 145 mmol/L   Potassium 4.3 3.5 - 5.1 mmol/L   Chloride 103 98 - 111 mmol/L   CO2 29 22 - 32 mmol/L   Glucose, Bld 125 (H) 70 - 99  mg/dL    Comment: Glucose reference range applies only to samples taken after fasting for at least 8 hours.   BUN 24 (H) 8 - 23 mg/dL   Creatinine, Ser 0.60 0.44 - 1.00 mg/dL   Calcium 8.8 (L) 8.9 - 10.3 mg/dL   Total Protein 6.7 6.5 - 8.1 g/dL   Albumin 3.5 3.5 - 5.0 g/dL   AST 33 15 - 41 U/L   ALT 31 0 - 44 U/L   Alkaline Phosphatase 85 38 - 126 U/L   Total Bilirubin 0.5 0.3 - 1.2 mg/dL   GFR, Estimated >60 >60 mL/min    Comment: (NOTE) Calculated using the CKD-EPI Creatinine Equation (2021)    Anion gap 4 (L) 5 - 15    Comment: Performed at [facility], [address] [place], [place] [address]  CBC with Differential     Status: None   Collection Time: 09/02/21  9:38 AM  Result Value Ref Range   WBC 7.4 4.0 - 10.5 K/uL   RBC 4.33 3.87 - 5.11 MIL/uL   Hemoglobin 13.6 12.0 - 15.0 g/dL   HCT 40.3 36.0 - 46.0 %   MCV 93.1 80.0 - 100.0 fL   MCH 31.4 26.0 - 34.0 pg   MCHC 33.7 30.0 - 36.0 g/dL   RDW 12.0 11.5 - 15.5 %   Platelets 198 150 - 400 K/uL   nRBC 0.0 0.0 - 0.2 %   Neutrophils Relative % 56 %   Neutro Abs 4.1 1.7 - 7.7 K/uL   Lymphocytes Relative 34 %   Lymphs Abs 2.5 0.7 - 4.0 K/uL   Monocytes Relative 9 %   Monocytes Absolute 0.6 0.1 - 1.0 K/uL   Eosinophils Relative 1 %   Eosinophils Absolute 0.1 0.0 - 0.5 K/uL   Basophils Relative 0 %   Basophils Absolute 0.0 0.0 - 0.1 K/uL   Immature Granulocytes 0 %   Abs Immature Granulocytes 0.03 0.00 - 0.07 K/uL    Comment: Performed at [facility], [address], [place], [place] [address]  [id number]+EGFR     Status: Abnormal   Collection  Time: 10/30/21  7:15 AM  Result Value Ref Range   Glucose 144 (H) 70 - 99 mg/dL   BUN 17 8 - 27 mg/dL   Creatinine, Ser 0.71 0.57 - 1.00 mg/dL   eGFR 96 >59 mL/min/1.73   BUN/Creatinine Ratio 24 12 - 28   Sodium 138 134 - 144 mmol/L   Potassium 4.1 3.5 - 5.2 mmol/L   Chloride 99 96 - 106 mmol/L   CO2 25 20 - 29 mmol/L   Calcium 9.0 8.7 - 10.3 mg/dL   Total Protein  6.7 6.0 - 8.5 g/dL   Albumin 4.0 3.8 - 4.8 g/dL   Globulin, Total 2.7 1.5 - 4.5 g/dL   Albumin/Globulin Ratio 1.5 1.2 - 2.2   Bilirubin Total 0.6 0.0 - 1.2 mg/dL   Alkaline Phosphatase 103 44 - 121 IU/L   AST 72 (H) 0 - 40 IU/L   ALT 60 (H) 0 - 32 IU/L  POCT HgB A1C     Status: Abnormal   Collection Time: 10/31/21 11:03 AM  Result Value Ref Range   Hemoglobin A1C 6.5 (A) 4.0 - 5.6 %   HbA1c POC (<> result, manual entry)     HbA1c, POC (prediabetic range)     HbA1c, POC (controlled diabetic range)      Radiology: LONG TERM MONITOR (3-14 DAYS)  Result Date: 08/26/2021 Patch Wear Time:  13 days and 2 hours (2023-03-20T14:37:45-0400 to 2023-04-02T16:45:18-0400) Patient had a min HR of 62 bpm, max HR of 138 bpm, and avg HR of 81 bpm. Predominant underlying rhythm was Sinus Rhythm. 2 Supraventricular Tachycardia runs occurred, the run with the fastest interval lasting 5 beats with a max rate of 138 bpm, the longest lasting 5 beats with an avg rate of 107 bpm. Isolated SVEs were rare (<1.0%), and no SVE Couplets or SVE Triplets were present. Isolated VEs were rare (<1.0%), and no VE Couplets or VE Triplets were present. Pt needs to see cardiology   DG Bone Density  Result Date: 08/19/2021 EXAM: DUAL X-RAY ABSORPTIOMETRY (DXA) FOR BONE MINERAL DENSITY IMPRESSION: Your patient Yamili Lichtenwalner completed a BMD test on 08/19/2021 using the [place] (software version: 14.10) manufactured by [employer]. The following summarizes the results of our evaluation. Technologist: [facility] PATIENT BIOGRAPHICAL: Name: Neomi, Laidler Patient ID: [id number] Birth Date: August 23, 1960 Height: 65.0 in. Gender: Female Exam Date: 08/19/2021 Weight: 202.2 lbs. Indications: Caucasian, Asthma, Postmenopausal, High Risk Meds, History of Breast Cancer, History of Chemo, Diabetic, Osteoarthritis Fractures: Treatments: Insulin, Ventolin HFA, Vitamin D DENSITOMETRY RESULTS: Site      Region     Measured Date  Measured Age WHO Classification Young Adult T-score BMD         %Change vs. Previous Significant Change (*) AP Spine L1-L2 08/19/2021 61.8 Normal 0.6 1.245 g/cm2 -2.0% - AP Spine L1-L2 01/24/2019 59.2 Normal 0.8 1.271 g/cm2 2.9% - AP Spine L1-L2 06/27/2015 55.7 Normal 0.5 1.235 g/cm2 - - DualFemur Neck Right 08/19/2021 61.8 Normal -0.3 1.001 g/cm2 -1.6% - DualFemur Neck Right 01/24/2019 59.2 Normal -0.1 1.017 g/cm2 -3.1% - DualFemur Neck Right 06/27/2015 55.7 Normal 0.1 1.049 g/cm2 - - DualFemur Total Mean 08/19/2021 61.8 Normal 0.9 1.120 g/cm2 0.0% - DualFemur Total Mean 01/24/2019 59.2 Normal 0.9 1.120 g/cm2 -0.9% - DualFemur Total Mean 06/27/2015 55.7 Normal 1.0 1.130 g/cm2 - - ASSESSMENT: The BMD measured at Femur Neck Right is 1.001 g/cm2 with a T-score of -0.3. This patient is considered normal according to [place] [facility])  criteria. The scan quality is good. L-3 & 4 was excluded due to degenerative changes. Compared with prior study, there has been no significant change in the spine. Compared with prior study, there has been no significant change in the total hip. World Pharmacologist [facility]) criteria for post-menopausal, Caucasian Women: Normal:                   T-score at or above -1 SD Osteopenia/low bone mass: T-score between -1 and -2.5 SD Osteoporosis:             T-score at or below -2.5 SD RECOMMENDATIONS: 1. All patients should optimize calcium and vitamin D intake. 2. Consider FDA-approved medical therapies in postmenopausal women and men aged 42 years and older, based on the following: a. A hip or vertebral(clinical or morphometric) fracture b. T-score < -2.5 at the femoral neck or spine after appropriate evaluation to exclude secondary causes c. Low bone mass (T-score between -1.0 and -2.5 at the femoral neck or spine) and a 10-year probability of a hip fracture > 3% or a 10-year probability of a major osteoporosis-related fracture > 20% based on the [place]-adapted WHO algorithm 3.  Clinician judgment and/or patient preferences may indicate treatment for people with 10-year fracture probabilities above or below these levels FOLLOW-UP: People with diagnosed cases of osteoporosis or at high risk for fracture should have regular bone mineral density tests. For patients eligible for Medicare, routine testing is allowed once every 2 years. The testing frequency can be increased to one year for patients who have rapidly progressing disease, those who are receiving or discontinuing medical therapy to restore bone mass, or have additional risk factors. I have reviewed this report, and agree with the above findings. Mark A. Thornton Papas, M.D. [facility] Electronically Signed   By: Lavonia Dana M.D.   On: 08/19/2021 09:55    No results found.  No results found.    Assessment and Plan: Patient Active Problem List   Diagnosis Date Noted   Complex tear of medial meniscus of right knee as current injury 03/04/2020   Aortic atherosclerosis ([place]) 03/04/2020   Osteoarthritis of knee 03/02/2020   Encounter for general adult medical examination with abnormal findings 01/31/2020   Primary osteoarthritis of both knees 01/31/2020   Degenerative disc disease, lumbar 01/31/2020   Genetic testing 11/16/2019   Family history of breast cancer    Family history of liver cancer    Multinodular goiter 10/10/2019   Thyromegaly 09/19/2019   Allergic reaction 07/01/2019   Breast cancer of upper-outer quadrant of left female breast ([place]) 10/27/2018   Routine cervical smear 10/19/2018   Uncontrolled type 2 diabetes mellitus with hyperglycemia ([place]) 10/19/2018   Gastroesophageal reflux disease with esophagitis 10/19/2018   Stomatitis and mucositis 10/19/2018   Urinary tract infection without hematuria 10/19/2018   Dysuria 10/19/2018   Need for vaccination against Streptococcus pneumoniae using pneumococcal conjugate vaccine 13 04/21/2018   Acute bronchitis 10/01/2017   Wheezing 10/01/2017    Sore throat 10/01/2017   Fatigue 09/23/2017   Vitamin D deficiency 09/23/2017   Anemia 09/23/2017   Acute non-recurrent pansinusitis 08/12/2017   Type 2 diabetes mellitus with hyperglycemia, without long-term current use of insulin ([place]) 08/12/2017   Epstein Barr virus infection 08/12/2017   Essential hypertension 05/18/2017   Abnormal CT scan, stomach    Gastric varices    Cellulitis of female breast 12/11/2016   Open wound of breast 12/11/2016   Abdominal distention 01/23/2016   Infection  due to portacath 12/12/2014   Nausea 09/22/2014   EN (erythema nodosum) 07/26/2014   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 SOB (shortness of breath)  - Spirometry with Graph  2. OSA (obstructive sleep apnea) She needs to have a CPAP titration study because the patient has significant obstructive sleep apnea along with oxygen desaturations noted on the baseline PSG.  She likely has some evidence of overlap syndrome with the severe desaturations.  Hopefully this can be controlled with the use of PAP therapy once this is done - Cpap titration; Future  3. Obesity, morbid ([place]) Obesity Counseling: Had a lengthy discussion regarding patients BMI and weight issues. Patient was instructed on portion control as well as increased activity. Also discussed caloric restrictions with trying to maintain intake less than 2000 Kcal. Discussions were made in accordance with the 5As of weight management. Simple actions such as not eating  late and if able to, taking a walk is suggested.   4. Obstructive chronic bronchitis without exacerbation ([place]) Currently is on Breztri along with albuterol for rescue.  This does appear to be controlling her.  Spirometry results were reviewed with the patient today  General Counseling: I have discussed the findings of the evaluation and examination with Selinda Eon.  I have also discussed any further diagnostic evaluation thatmay be needed or ordered today. Ladonya verbalizes understanding of the findings of todays visit. We also reviewed her medications today and discussed drug interactions and side effects including but not limited excessive drowsiness and altered mental states. We also discussed that there is always a risk not just to her but also people around her. she has been encouraged to call the office with any questions or concerns that should arise related to todays visit.  Orders Placed This Encounter  Procedures   Spirometry with Graph    Order Specific Question:   Where should this test be performed?    Answer:   [facility]    Order Specific Question:   Basic spirometry    Answer:   Yes     Time spent: 49  I have personally obtained a history, examined the patient, evaluated laboratory and imaging results, formulated the assessment and plan and placed orders.    Allyne Gee, MD [facility] Pulmonary and Critical Care Sleep medicine

## 2021-11-05 ENCOUNTER — Telehealth: Payer: Self-pay

## 2021-11-09 ENCOUNTER — Other Ambulatory Visit: Payer: Self-pay | Admitting: Internal Medicine

## 2021-11-13 ENCOUNTER — Telehealth: Payer: Self-pay

## 2021-11-13 NOTE — Telephone Encounter (Signed)
Patient had SS completed 10/29/21-Danielle Wallace

## 2021-11-14 ENCOUNTER — Ambulatory Visit (INDEPENDENT_AMBULATORY_CARE_PROVIDER_SITE_OTHER): Payer: BC Managed Care – PPO | Admitting: Gastroenterology

## 2021-11-14 ENCOUNTER — Encounter: Payer: Self-pay | Admitting: Gastroenterology

## 2021-11-14 VITALS — BP 114/56 | HR 74 | Temp 98.1°F | Ht 66.0 in | Wt 203.0 lb

## 2021-11-14 DIAGNOSIS — K7460 Unspecified cirrhosis of liver: Secondary | ICD-10-CM | POA: Diagnosis not present

## 2021-11-14 DIAGNOSIS — R0902 Hypoxemia: Secondary | ICD-10-CM | POA: Diagnosis not present

## 2021-11-14 NOTE — Addendum Note (Signed)
Addended by: Lurlean Nanny on: 11/14/2021 05:59 PM   Modules accepted: Orders

## 2021-11-14 NOTE — Progress Notes (Signed)
Primary Care Physician: Mylinda Latina, PA-C  Primary Gastroenterologist:  Dr. Lucilla Lame  Chief Complaint  Patient presents with   Follow-up    HPI: Danielle Wallace is a 61 y.o. female here with a history of fatty liver and cirrhosis.  The patient has been on nadolol and was seen in June of last year for refill.  The patient has not had a recent EGD and her last alpha-fetoprotein was 8 months ago.  The patient's ultrasound was in July 2022. The patient reports that ever since she had Covid she slowly became more short of breath and has seen to pulmonologist and has been started on oxygen.  The patient has also had her blood pressure medications adjusted frequently to better control her blood pressure. Nothing has been found is the cause of her shortness of breath but she reports that she feels much better now that she is on oxygen.  She still is very frustrated that she does not have full lung function and is not feeling as well as she thinks she should be  Past Medical History:  Diagnosis Date   Arthritis 2006   Asthma    Breast cancer ([place]) 10/14/2013   18 mm, T1c, N0; ER/ PR positive,her 2 neu overexpressed. Adjuvant chemo/ herceptin.   Cancer [facility]) 2006   Renal cell carcinoma; cryosurgery treatment, right side   Cirrhosis ([place])    Collapsed lung 2007   Diabetes mellitus without complication ([place]) 2707   Metformin   Diffuse cystic mastopathy    Family history of breast cancer    Family history of liver cancer    Motion sickness    any moving vehicle   Orthodontics    braces   Personal history of chemotherapy    Sinus problem     Current Outpatient Medications  Medication Sig Dispense Refill   albuterol (VENTOLIN HFA) 108 (90 Base) MCG/ACT inhaler TAKE 2 PUFFS BY MOUTH EVERY 6 HOURS AS NEEDED FOR WHEEZE OR SHORTNESS OF BREATH 6.7 each 1   aspirin EC 81 MG tablet Take 1 tablet (81 mg total) by mouth daily. Swallow whole.     B COMPLEX-C PO Take by mouth daily.      Budeson-Glycopyrrol-Formoterol (BREZTRI AEROSPHERE) 160-9-4.8 MCG/ACT AERO Inhale 2 puffs into the lungs 2 (two) times daily. Pt needs 3 month supply 10.7 g 3   busPIRone (BUSPAR) 10 MG tablet Take 5 mg by mouth 2 (two) times daily.     Calcium Carb-Cholecalciferol (CALCIUM + D3) 600-200 MG-UNIT TABS Take 1 tablet by mouth 2 (two) times daily. 180 tablet 3   diphenhydrAMINE (BENADRYL) 25 MG tablet Take 25 mg by mouth every 6 (six) hours as needed.     EPINEPHrine (EPIPEN 2-PAK) 0.3 mg/0.3 mL IJ SOAJ injection Inject 0.3 mLs (0.3 mg total) into the skin as needed. 1 each 1   fluticasone (FLONASE) 50 MCG/ACT nasal spray Place 1 spray into both nostrils daily. 16 g 3   furosemide (LASIX) 20 MG tablet Take 20 mg by mouth daily.     glucose blood (ONETOUCH VERIO) test strip Blood sugar testing done QD and as needed  E11.65 100 each 12   imipramine (TOFRANIL) 25 MG tablet TAKE 2 TABLETS AT BEDTIME 180 tablet 3   insulin degludec (TRESIBA FLEXTOUCH) 100 UNIT/ML FlexTouch Pen Inject 40 Units into the skin daily. Take in the evening, raise dosage by 2 units every 3 days until blood sugar is 130 or below. 45 mL 1  Insulin Pen Needle (BD PEN NEEDLE NANO 2ND GEN) 32G X 4 MM MISC Use as directed to inject [name] Daily into skin E11.65 300 each 3   linaclotide (LINZESS) 290 MCG CAPS capsule Take 1 capsule (290 mcg total) by mouth daily before breakfast. 90 capsule 1   OZEMPIC, 1 MG/DOSE, 4 MG/3ML SOPN INJECT 1 MG UNDER THE SKIN ONCE A WEEK 6 mL 5   pantoprazole (PROTONIX) 40 MG tablet Take 1 tablet (40 mg total) by mouth daily. 90 tablet 3   pravastatin (PRAVACHOL) 20 MG tablet Take 1 tablet (20 mg total) by mouth daily. 90 tablet 1   propranolol (INDERAL) 10 MG tablet Take 1 tablet (10 mg total) by mouth 3 (three) times daily. If bp is greater than 140 90 tablet 3   spironolactone (ALDACTONE) 25 MG tablet TAKE 1 TABLET BY MOUTH TWICE A DAY 60 tablet 0   tamoxifen (NOLVADEX) 10 MG tablet Take 1 tablet (10 mg  total) by mouth daily. 90 tablet 2   No current facility-administered medications for this visit.    Allergies as of 11/14/2021 - Review Complete 11/14/2021  Allergen Reaction Noted   Exemestane Anaphylaxis 06/18/2015   Floxin [ofloxacin] Anaphylaxis 10/12/2013   Gluten meal Anaphylaxis 01/13/2017   Levofloxacin Anaphylaxis, Diarrhea, Itching, Nausea Only, Other (See Comments), Palpitations, Shortness Of Breath, Swelling, and Tinitus    Linezolid Anaphylaxis 09/25/2014   Sesame oil Anaphylaxis 11/02/2019   Taxotere [docetaxel] Anaphylaxis 09/25/2014   Hydrocodone Itching 10/12/2013   Invokana [canagliflozin]  10/06/2019   Metformin and related  10/06/2019   Sulfa antibiotics Other (See Comments) 12/28/2012   Codeine Rash 08/09/2014   Penicillins Rash 10/12/2013   Vancomycin Rash 08/16/2014    ROS:  General: Negative for anorexia, weight loss, fever, chills, fatigue, weakness. ENT: Negative for hoarseness, difficulty swallowing , nasal congestion. CV: Negative for chest pain, angina, palpitations, dyspnea on exertion, peripheral edema.  Respiratory: Negative for dyspnea at rest, dyspnea on exertion, cough, sputum, wheezing.  GI: See history of present illness. GU:  Negative for dysuria, hematuria, urinary incontinence, urinary frequency, nocturnal urination.  Endo: Negative for unusual weight change.    Physical Examination:   BP (!) 114/56   Pulse 74   Temp 98.1 F (36.7 C) (Oral)   Wt 203 lb (92.1 kg)   BMI 32.77 kg/m   General: Well-nourished, well-developed in no acute distress.  Eyes: No icterus. Conjunctivae pink. Neuro: Alert and oriented x 3.  Grossly intact. Skin: Warm and dry, no jaundice.   Psych: Alert and cooperative, normal mood and affect.  Labs:    Imaging Studies: No results found.  Assessment and Plan:   Danielle Wallace is a 61 y.o. y/o female who comes in today with a history of nonalcoholic fatty liver disease with the patient in need of a  right upper quadrant ultrasound and alpha-fetoprotein. 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  The patient has been explained the plan and agrees with it.     Lucilla Lame, MD. Marval Regal    Note: This dictation was prepared with Dragon dictation along with  smaller phrase technology. Any transcriptional errors that result from this process are unintentional.

## 2021-11-15 LAB — AFP TUMOR MARKER: AFP, Serum, Tumor Marker: 4.8 ng/mL (ref 0.0–9.2)

## 2021-11-15 NOTE — Procedures (Signed)
[place] Report Part I                                                                 Phone: [phone] Fax: [phone]  Patient Name: Danielle Wallace, Danielle Wallace Acquisition Number: [id number]  Date of Birth: 11-16-1960 Acquisition Date: 10/29/2021  Referring Physician: Lavera Guise MD     History: The patient is a 61 year old female who was referred for evaluation of possible sleep apnea with snoring and sleepiness. Medical History: Diabetes, hypertension, heart issues, OSA, arthritis, asthma, cirrhosis, breast cancer, collapsed lung  Medications: albuterol, aspirin, b complex-c po, budeson glycopyrrol formoterol, buspirone, calcium carb, epinephrine, fluticasone, furosemide, imipramine, linaclotide, ozempic, pantoprazolepravastatin, propranolol, spironolactone, tamoxifen  Procedure: This routine overnight polysomnogram was performed on the [place] 4 or 5 using the standard diagnostic protocol. This included 6 channels of EEG, 2 channels of EOG, chin EMG, bilateral anterior tibialis EMG, nasal/oral thermister, PTAF (nasal pressure transducer), chest and abdominal wall movements, EKG, pulse oximetry and EtCO2 when appropriate.  Description: The total recording time was 435.7 minutes. The total sleep time was 356.7 minutes. There were a total of 64.5 minutes of wakefulness after sleep onset for a reducedsleep efficiency of 81.9%. The latency to sleep onset was within normal limits at 14.5 minutes. The R sleep onset latency was prolonged at 361.5 minutes. Sleep parameters, as a percentage of the total sleep time, demonstrated 4.2% of sleep was in N1 sleep, 80.1% N2, 0.0% N3 and 15.7% R sleep. There were a total of 46 arousals for an arousal index of 7.7 arousals per hour of sleep that was normal.  Respiratory monitoring demonstrated occasional mild degree of snoring in all positions. There were 49 apneas and hypopneas for an Apnea Hypopnea Index of 8.2 apneas and hypopneas  per hour of sleep. The REM related apnea hypopnea index was 41.8/hr of REM sleep compared to a NREM AHI of 1.6/hr.  The average duration of the respiratory events was 22.0 seconds with a maximum duration of 55.5 seconds. The respiratory events occurred in all positions. The respiratory events were associated with peripheral oxygen desaturations on the average to 82%. The lowest oxygen desaturation associated with a respiratory event was 69%. Additionally, the baseline oxygen saturation during wakefulness was 94%, during NREM sleep averaged 91%, and during REM sleep averaged  87%. The total duration of oxygen < 90% was 141.6 minutes and <80% was 3.8 minutes.  Cardiac monitoring- did not demonstrate transient cardiac decelerations associated with the apneas. There were no significant cardiac rhythm irregularities.   Periodic limb movement monitoring- demonstrated that there were 275 periodic limb movements for a periodic limb movement index of 46.3 periodic limb movements per hour of sleep.   Impression: This routine overnight polysomnogram demonstrated significant obstructive sleep apnea with an overall Apnea Hypopnea Index of 8.2 apneas and hypopneas per hour of sleep. The respiratory events were associated with peripheral oxygen desaturations on the average to 82% with the lowest desaturation to 69%.    There was a significantly elevated periodic limb movement index of 46.3 periodic limb movements per hour of sleep.   There was a reduced sleep efficiency failure to progress into the deeper stages of sleepThese findings would appear to be due to the  combination of obstructive sleep apnea and periodic limb movements. Separate treatment for periodic limb movements may be suggested if clinically indicated.  Recommendations:    A CPAP titration would be recommended for the sleep apnea.  Would recommend weight loss  Alternative treatment options may include an oral appliance or ENT surgery in the  appropriate clinical context.       Allyne Gee, MD [facility] Diplomate ABMS Pulmonary Critical Care Medicine Sleep Medicine Electronically reviewed and digitally signed   [place] Report Part II  Phone: [phone] Fax: [phone]  Patient last name Traore Neck Size 13.0   in. Acquisition [phone]  Patient first name Danielle Wallace Weight 203.0 lbs. Started 10/29/2021 at 10:49:00 PM  Birth date 1961-02-17 Height 66.0 in. Stopped 10/30/2021 at 6:10:42 AM  Age 51 BMI 32.8 lb/in2 Duration 435.7  Study Type Adult      Corene Cornea Coursey RPSGT, Margaretmary Eddy Sleep Data: Lights Out: 10:55:00 PM Sleep Onset: 11:09:30 PM  Lights On: 6:10:42 AM Sleep Efficiency: 81.9 %  Total Recording Time: 435.7 min Sleep Latency (from Lights Off) 14.5 min  Total Sleep Time (TST): 356.7 min R Latency (from Sleep Onset): 361.5 min  Sleep Period Time: 421.2 min Total number of awakenings: 15  Wake during sleep: 64.5 min Wake After Sleep Onset (WASO): 64.5 min   Sleep Data:         Arousal Summary: Stage  Latency from lights out (min) Latency from sleep onset (min) Duration (min) % Total Sleep Time  Normal values  N 1 14.5 0.0 15.0 4.2 (5%)  N 2 15.0 0.5 285.7 80.1 (50%)  N 3       0.0 0.0 (20%)  R 376.0 361.5 56.0 15.7 (25%)    Number Index  Spontaneous 33 5.6  Apneas & Hypopneas 3 0.5  RERAs 0 0.0       (Apneas & Hypopneas & RERAs)  (3) (0.5)  Limb Movement 10 1.7  Snore 0 0.0  TOTAL 46 7.7     Respiratory Data:  CA OA MA Apnea Hypopnea* A+ H RERA Total  Number 0 0 0 0 49 49 0 49  Mean Dur (sec) 0.0 0.0 0.0 0.0 22.0 22.0 0.0 22.0  Max Dur (sec) 0.0 0.0 0.0 0.0 55.5 55.5 0.0 55.5  Total Dur (min) 0.0 0.0 0.0 0.0 18.0 18.0 0.0 18.0  % of TST 0.0 0.0 0.0 0.0 5.0 5.0 0.0 5.0  Index (#/h TST) 0.0 0.0 0.0 0.0 8.2 8.2 0.0 8.2  *Hypopneas scored based on 4% or greater desaturation.  Sleep Stage:     Body Position Data:   REM NREM TST  AHI 41.8 1.6 8.2  RDI 41.8 1.6 8.2          Sleep (min) TST (%) REM (min) NREM (min) CA (#) OA (#) MA (#) HYP (#) AHI (#/h) RERA (#) RDI (#/h) Desat (#)  Supine 11.0 3.08 0.0 11.0 0 0 0 0 0.0 0 0.00 2  Non-Supine 345.70 96.92 56.00 289.70 0.00 0.00 0.00 49.00 8.50 0 8.50 102.00  Left: 25.6 7.18 0.0 25.6 0 0 0 0 0.0 0 0.00 3  Right: 304.7 85.42 56.0 248.7 0 0 0 49 9.6 0 9.6 99  UP: 15.4 4.32 0.0 15.4 0 0 0 0 0.0 0 0.00 0       Snoring: Total number of snoring episodes  0  Total time with snoring    min (   % of sleep)   Oximetry  Distribution:             WK REM NREM TOTAL  Average (%)   94 87 91 91  < 90% 2.7 40.7 98.2 141.6  < 80% 0.0 3.7 0.1 3.8  < 70% 0.0 0.0 0.1 0.1  # of Desaturations* 3 59 42 104  Desat Index (#/hour) 2.3 63.2 8.4 17.5  Desat Max (%) 10 18 8 18   Desat Max Dur (sec) 33.0 70.0 117.0 117.0  Approx Min O2 during sleep 69  Approx min O2 during a respiratory event 69  Was Oxygen added (Y/N) and final rate No:   0 LPM  *Desaturations based on 3% or greater drop from baseline.   Cheyne Stokes Breathing: None Present   Hypoventilation: None Present    Heart Rate Summary:  Average Heart Rate During Sleep 99.0 bpm      Highest Heart Rate During Sleep (95th %) 107.0 bpm      Highest Heart Rate During Sleep 195 bpm (artifact)  Highest Heart Rate During Recording (TIB) 231 bpm (artifact)   Heart Rate Observations: Event Type # Events   Bradycardia 0 Lowest HR Scored: N/A  Sinus Tachycardia During Sleep 0 Highest HR Scored: N/A  Narrow Complex Tachycardia 0 Highest HR Scored: N/A  Wide Complex Tachycardia 0 Highest HR Scored: N/A  Asystole 0 Longest Pause: N/A  Atrial Fibrillation 0 Duration Longest Event: N/A  Other Arrythmias  No Type:    Periodic Limb Movement Data: (Primary legs unless otherwise noted) Total # Limb Movement 283 Limb Movement Index 47.6  Total # PLMS 275 PLMS Index 46.3  Total # PLMS Arousals 10 PLMS Arousal Index 1.7  Percentage Sleep Time with PLMS  100.4mn (28.1 % sleep)  Mean Duration limb movements (secs) 858.3

## 2021-11-19 ENCOUNTER — Telehealth: Payer: Self-pay

## 2021-11-19 ENCOUNTER — Encounter: Payer: Self-pay | Admitting: Internal Medicine

## 2021-11-19 ENCOUNTER — Encounter: Payer: Self-pay | Admitting: Gastroenterology

## 2021-11-19 ENCOUNTER — Ambulatory Visit: Payer: BC Managed Care – PPO | Admitting: Internal Medicine

## 2021-11-19 DIAGNOSIS — K7681 Hepatopulmonary syndrome: Secondary | ICD-10-CM | POA: Diagnosis not present

## 2021-11-19 DIAGNOSIS — J449 Chronic obstructive pulmonary disease, unspecified: Secondary | ICD-10-CM

## 2021-11-19 DIAGNOSIS — E1165 Type 2 diabetes mellitus with hyperglycemia: Secondary | ICD-10-CM | POA: Diagnosis not present

## 2021-11-19 DIAGNOSIS — M1990 Unspecified osteoarthritis, unspecified site: Secondary | ICD-10-CM

## 2021-11-19 DIAGNOSIS — J4520 Mild intermittent asthma, uncomplicated: Secondary | ICD-10-CM

## 2021-11-19 DIAGNOSIS — G4733 Obstructive sleep apnea (adult) (pediatric): Secondary | ICD-10-CM | POA: Diagnosis not present

## 2021-11-19 DIAGNOSIS — J9611 Chronic respiratory failure with hypoxia: Secondary | ICD-10-CM

## 2021-11-19 MED ORDER — TRAMADOL HCL 50 MG PO TABS: ORAL_TABLET | ORAL | 1 refills | Status: DC

## 2021-11-19 MED ORDER — ZOSTER VAC RECOMB ADJUVANTED 50 MCG/0.5ML IM SUSR: 0.5000 mL | Freq: Once | INTRAMUSCULAR | 0 refills | Status: AC

## 2021-11-19 NOTE — Telephone Encounter (Signed)
SS order placed in [facility] folder-Toni 

## 2021-11-19 NOTE — Progress Notes (Signed)
[facility] [place], [place] [address]  Internal MEDICINE  Office Visit Note  Patient Name: Danielle Wallace  [id number]  [id number]  Date of Service: 11/19/2021  Chief Complaint  Patient presents with   Follow-up    hepatic pulmonary syndrome, referral to specialist for alpha-1 antitrypsin    Diabetes   Asthma   Quality Metric Gaps    Shingrix, foot exam, CPE    HPI  Pt is here for routine follow up. Complicated history so far since COVID infection. Pt continued to have DOE, had following findings coronary calcium score of 17 Had CT chest done. Hypoxemia with ambulation now 3L/[place]  Has Cirrhosis of liver seen by GI , has esophageal varices      Current Medication: Outpatient Encounter Medications as of 11/19/2021  Medication Sig   albuterol (VENTOLIN HFA) 108 (90 Base) MCG/ACT inhaler TAKE 2 PUFFS BY MOUTH EVERY 6 HOURS AS NEEDED FOR WHEEZE OR SHORTNESS OF BREATH   aspirin EC 81 MG tablet Take 1 tablet (81 mg total) by mouth daily. Swallow whole.   B COMPLEX-C PO Take by mouth daily.   Budeson-Glycopyrrol-Formoterol (BREZTRI AEROSPHERE) 160-9-4.8 MCG/ACT AERO Inhale 2 puffs into the lungs 2 (two) times daily. Pt needs 3 month supply   busPIRone (BUSPAR) 10 MG tablet Take 5 mg by mouth 2 (two) times daily.   Calcium Carb-Cholecalciferol (CALCIUM + D3) 600-200 MG-UNIT TABS Take 1 tablet by mouth 2 (two) times daily.   diphenhydrAMINE (BENADRYL) 25 MG tablet Take 25 mg by mouth every 6 (six) hours as needed.   EPINEPHrine (EPIPEN 2-PAK) 0.3 mg/0.3 mL IJ SOAJ injection Inject 0.3 mLs (0.3 mg total) into the skin as needed.   fluticasone (FLONASE) 50 MCG/ACT nasal spray Place 1 spray into both nostrils daily.   furosemide (LASIX) 20 MG tablet Take 20 mg by mouth daily.   glucose blood (ONETOUCH VERIO) test strip Blood sugar testing done QD and as needed  E11.65   imipramine (TOFRANIL) 25 MG tablet TAKE 2 TABLETS AT BEDTIME   insulin degludec (TRESIBA  FLEXTOUCH) 100 UNIT/ML FlexTouch Pen Inject 40 Units into the skin daily. Take in the evening, raise dosage by 2 units every 3 days until blood sugar is 130 or below.   Insulin Pen Needle (BD PEN NEEDLE NANO 2ND GEN) 32G X 4 MM MISC Use as directed to inject [name] Daily into skin E11.65   linaclotide (LINZESS) 290 MCG CAPS capsule Take 1 capsule (290 mcg total) by mouth daily before breakfast.   OZEMPIC, 1 MG/DOSE, 4 MG/3ML SOPN INJECT 1 MG UNDER THE SKIN ONCE A WEEK   pantoprazole (PROTONIX) 40 MG tablet Take 1 tablet (40 mg total) by mouth daily.   pravastatin (PRAVACHOL) 20 MG tablet Take 1 tablet (20 mg total) by mouth daily.   propranolol (INDERAL) 10 MG tablet Take 1 tablet (10 mg total) by mouth 3 (three) times daily. If bp is greater than 140   spironolactone (ALDACTONE) 25 MG tablet TAKE 1 TABLET BY MOUTH TWICE A DAY   tamoxifen (NOLVADEX) 10 MG tablet Take 1 tablet (10 mg total) by mouth daily.   traMADol (ULTRAM) 50 MG tablet Take one tab a day prn only for svere pain   [DISCONTINUED] Zoster Vaccine Adjuvanted [facility]) injection Inject 0.5 mLs into the muscle once.   Zoster Vaccine Adjuvanted [facility]) injection Inject 0.5 mLs into the muscle once for 1 dose.   No facility-administered encounter medications on file as of 11/19/2021.  Surgical History: Past Surgical History:  Procedure Laterality Date   BREAST BIOPSY Right 2005   negative   BREAST BIOPSY Left 08/23/2007   negative   BREAST BIOPSY Left 10/24/2013   positive   BREAST BIOPSY Left 07/2002   neg   BREAST BIOPSY Left 08/23/2007   neg   BREAST SURGERY Left 10/2011   Cyst Aspirationapocrine metaplasia, ductal cells and bone cells, hypo-cellular   BREAST SURGERY Left 2009   ADH on stereotactic biopsy, 1.5 mm focus.   BREAST SURGERY Left 2003   fibrocystic changes with ductal hyperplasia without atypia.   BREAST SURGERY Left    mastectomy   BREAST SURGERY Left April 11, 2014   Removal of implant, debridement  chest wall [place]   COLONOSCOPY  08/26/2013   Verdie Shire, M.D. normal.   CRYOABLATION  2005, 2010   CYST REMOVAL NECK  10/2011   Dr. Tami Ribas   ESOPHAGOGASTRODUODENOSCOPY (EGD) WITH PROPOFOL N/A 01/16/2017   Procedure: ESOPHAGOGASTRODUODENOSCOPY (EGD) WITH PROPOFOL;  Surgeon: Lucilla Lame, MD;  Location: [place];  Service: Gastroenterology;  Laterality: N/A;  Diabetic - oral meds   INCISION AND DRAINAGE Left Dec 2015, Feb 2016   Dr Tula Nakayama   MASTECTOMY Left 11/24/2013   positive   PORTACATH PLACEMENT  11/24/13    Medical History: Past Medical History:  Diagnosis Date   Arthritis 2006   Asthma    Breast cancer ([place]) 10/14/2013   18 mm, T1c, N0; ER/ PR positive,her 2 neu overexpressed. Adjuvant chemo/ herceptin.   Cancer [facility]) 2006   Renal cell carcinoma; cryosurgery treatment, right side   Cirrhosis ([place])    Collapsed lung 2007   Diabetes mellitus without complication ([place]) 2376   Metformin   Diffuse cystic mastopathy    Family history of breast cancer    Family history of liver cancer    Motion sickness    any moving vehicle   Orthodontics    braces   Personal history of chemotherapy    Sinus problem     Family History: Family History  Problem Relation Age of Onset   Liver cancer Father    Hemochromatosis Father    Liver disease Father    Diabetes Mother    Hypertension Mother    Breast cancer Paternal Aunt 21   Ovarian cancer Maternal Grandmother        unk primary, metastatic cancer   Hemachromatosis Daughter    Liver cancer Paternal Uncle    Hemochromatosis Paternal Uncle     Social History   Socioeconomic History   Marital status: Married    Spouse name: Not on file   Number of children: Not on file   Years of education: Not on file   Highest education level: Not on file  Occupational History   Not on file  Tobacco Use   Smoking status: Never   Smokeless tobacco: Never  Vaping Use   Vaping  Use: Never used  Substance and Sexual Activity   Alcohol use: Not Currently    Comment: occasionally, none recently   Drug use: No   Sexual activity: Not on file  Other Topics Concern   Not on file  Social History Narrative   Not on file   Social Determinants of Health   Financial Resource Strain: Not on file  Food Insecurity: Not on file  Transportation Needs: Not on file  Physical Activity: Not on file  Stress: Not on  file  Social Connections: Not on file  Intimate Partner Violence: Not on file      Review of Systems  Vital Signs: BP (!) 110/54   Pulse 95   Temp 98.1 F (36.7 C)   Resp 16   Ht 5' 6"  (1.676 m)   Wt 202 lb 12.8 oz (92 kg)   SpO2 98% Comment: with 3L oxygen  BMI 32.73 kg/m    Physical Exam     Assessment/Plan:   General Counseling: Gessica verbalizes understanding of the findings of todays visit and agrees with plan of treatment. I have discussed any further diagnostic evaluation that may be needed or ordered today. We also reviewed her medications today. she has been encouraged to call the office with any questions or concerns that should arise related to todays visit.    Orders Placed This Encounter  Procedures   Microalbumin, urine   Ambulatory referral to Pulmonology    Meds ordered this encounter  Medications   traMADol (ULTRAM) 50 MG tablet    Sig: Take one tab a day prn only for svere pain    Dispense:  30 tablet    Refill:  1   Zoster Vaccine Adjuvanted [facility]) injection    Sig: Inject 0.5 mLs into the muscle once for 1 dose.    Dispense:  0.5 mL    Refill:  0    Total time spent:35 Minutes Time spent includes review of chart, medications, test results, and follow up plan with the patient.   [place] Controlled Substance Database was reviewed by me.   Dr Lavera Guise Internal medicine

## 2021-11-20 ENCOUNTER — Telehealth: Payer: Self-pay

## 2021-11-20 LAB — MICROALBUMIN, URINE: Microalbumin, Urine: <3 ug/mL

## 2021-11-20 NOTE — Telephone Encounter (Signed)
Pulmonology referral faxed to [facility]

## 2021-11-22 ENCOUNTER — Ambulatory Visit
Admission: RE | Admit: 2021-11-22 | Discharge: 2021-11-22 | Disposition: A | Discharge status: Home / Self Care | Payer: BC Managed Care – PPO | Source: Ambulatory Visit | Attending: Gastroenterology | Admitting: Gastroenterology

## 2021-11-22 ENCOUNTER — Encounter (INDEPENDENT_AMBULATORY_CARE_PROVIDER_SITE_OTHER): Payer: BC Managed Care – PPO | Admitting: Internal Medicine

## 2021-11-22 DIAGNOSIS — Z9049 Acquired absence of other specified parts of digestive tract: Secondary | ICD-10-CM | POA: Diagnosis not present

## 2021-11-22 DIAGNOSIS — K7460 Unspecified cirrhosis of liver: Secondary | ICD-10-CM

## 2021-11-22 DIAGNOSIS — K769 Liver disease, unspecified: Secondary | ICD-10-CM | POA: Diagnosis not present

## 2021-11-22 DIAGNOSIS — G4719 Other hypersomnia: Secondary | ICD-10-CM | POA: Diagnosis not present

## 2021-11-25 ENCOUNTER — Encounter: Payer: Self-pay | Admitting: Gastroenterology

## 2021-11-26 ENCOUNTER — Telehealth: Payer: Self-pay

## 2021-11-26 NOTE — Telephone Encounter (Signed)
I Have already done this on her last visit

## 2021-11-27 NOTE — Telephone Encounter (Signed)
There is only a pulmonology referral for her. [facility]monology received referral and patient scheduled for appointment. They are requesting you to do a referral to Dr. Bennetta Laos, Hepatologist for her as well.

## 2021-11-28 NOTE — Procedures (Signed)
[place] Report Part I  Phone: [phone] Fax: [phone]  Patient Name: Danielle Wallace, Danielle Wallace Acquisition Number: [id number]  Date of Birth: May 14, 1960 Acquisition Date: 11/22/2021  Referring Physician: Lavera Guise MD     History: The patient is a 61 year old female with obstructive sleep apnea for CPAP titration. Medical History: Diabetes, hypertension, heart issues, OSA, arthritis, cirrhosis, breast cancer, collapsed lung.  Medications: albuterol, aspirin, b complex-c, budeson glycopyrrol formoterol, buspirone, calcium carb, epinephrine, fluticasone, furosemide, imipramine, linaclotide, ozempie, pantoprazolepravastatin, propranolol, spironolactone, tamoxifen.  Procedure: This routine overnight polysomnogram was performed on the [facility] 5 using the standard CPAP protocol. This included 6 channels of EEG, 2 channels of EOG, chin EMG, bilateral anterior tibialis EMG, nasal/oral thermistor, PTAF (nasal pressure transducer), chest and abdominal wall movements, EKG, and pulse oximetry.  Description: The total recording time was 408.0 minutes. The total sleep time was 302.5 minutes. There were a total of 63.5 minutes of wakefulness after sleep onset for a reducedsleep efficiency of 74.1%. The latency to sleep onset was prolonged at 42.0 minutes. The R sleep onset latency was prolonged at 311.5 minutes. Sleep parameters, as a percentage of the total sleep time, demonstrated 6.4% of sleep was in N1 sleep, 79.0% N2, 0.0% N3 and 14.5% R sleep. There were a total of 31 arousals for an arousal index of 6.1 arousals per hour of sleep that was normal.  Overall, there were a total of 26 respiratory events for a respiratory disturbance index, which includes apneas, hypopneas and RERAs (increased respiratory effort) of 5.2 respiratory events per hour of sleep during the pressure titration. CPAP was initiated at 4 cm H2O at lights out, 10:54 p.m. The apnea was well controlled at 4  cm H2O in non-supine, non-REM sleep. Respiratory events reemerged in the early morning with REM sleep. The pressure  was titrated in 1-2 cm increments to the final pressure of 9 cm H2O. The apnea was controlled at this pressure in the supine position, however REM sleep was not observed.   Additionally, the baseline oxygen saturation during wakefulness was 94%, during NREM sleep averaged 94%, and during REM sleep averaged 90%. The total duration of oxygen < 90% was 24.1 minutes and <80% was 0.1 minutes.  Cardiac monitoring- There were no significant cardiac rhythm irregularities.   Periodic limb movement monitoring- demonstrated that there were 337 periodic limb movements for a periodic limb movement index of 66.8 periodic limb movements per hour of sleep. Quasi-periodic limb movements were observed during periods of wakefulness.  Impression: This patient's obstructive sleep apnea demonstrated significant improvement with the utilization of nasal CPAP at 4-9 cm H2O in non-REM sleep, however REM events persisted.   There was a highly elevated periodic limb movement index of 66.8 periodic limb movements per hour of sleep. In addition, quasi-periodic limb movements were observed during periods of wakefulness. These limb movements were also observed during the prior sleep study. Treatment may be indicated if sleep disruption or sleepiness persist once the patient is fully compliant with CPAP.  Recommendations: Would recommend utilization of auto-adjusting CPAP at 4-16 cm H2O.      An AirFit F20 mask, size Small , was used. Chin strap used during study- No . Humidifier used during study- Yes .     Danielle Gee, MD, [facility] Diplomate ABMS-Pulmonary, Critical Care and Sleep Medicine  Electronically reviewed and digitally signed  [place] CPAP/BIPAP Polysomnogram Report Part II Phone: [phone] Fax: [phone]  Patient last name Wallace Neck Size 13.0   in. Acquisition [phone]   Patient first name Danielle Weight 203.0 lbs. Started 11/22/2021 at 10:47:09 PM  Birth date Sep 20, 1960 Height 66.0 in. Stopped 11/23/2021 at 5:44:45 AM  Age 43      Type Adult BMI 32.8 lb/in2 Duration 408.0  Danielle Wallace - RPSGT, Danielle Wallace  Reviewed by: Richelle Ito. Henke, PhD, ABSM, FAASM Sleep Data: Lights Out: 10:54:09 PM Sleep Onset: 11:36:09 PM  Lights On: 5:42:09 AM Sleep Efficiency: 74.1 %  Total Recording Time: 408.0 min Sleep Latency (from Lights Off) 42.0 min  Total Sleep Time (TST): 302.5 min R Latency (from Sleep Onset): 311.5 min  Sleep Period Time: 366.0 min Total number of awakenings: 13  Wake during sleep: 63.5 min Wake After Sleep Onset (WASO): 63.5 min   Sleep Data:         Arousal Summary: Stage  Latency from lights out (min) Latency from sleep onset (min) Duration (min) % Total Sleep Time  Normal values  N 1 42.0 0.0 19.5 6.4 (5%)  N 2 43.0 1.0 239.0 79.0 (50%)  N 3       0.0 0.0 (20%)  R 353.5 311.5 44.0 14.5 (25%)    Number Index  Spontaneous 29 5.8  Apneas & Hypopneas 0 0.0  RERAs 1 0.2       (Apneas & Hypopneas & RERAs)  (1) (0.2)  Limb Movement 2 0.4  Snore 0 0.0  TOTAL 32 6.3     Respiratory Data:  CA OA MA Apnea Hypopnea* A+ H RERA Total  Number 0 0 0 0 25 25 1 26   Mean Dur (sec) 0.0 0.0 0.0 0.0 17.0 17.0 16.5 16.9  Max Dur (sec) 0.0 0.0 0.0 0.0 27.0 27.0 16.5 27.0  Total Dur (min) 0.0 0.0 0.0 0.0 7.1 7.1 0.3 7.3  % of TST 0.0 0.0 0.0 0.0 2.3 2.3 0.1 2.4  Index (#/h TST) 0.0 0.0 0.0 0.0 5.0 5.0 0.2 5.2  *Hypopneas scored based on 4% or greater desaturation.  Sleep Stage:         REM NREM TST  AHI 32.7 0.2 5.0  RDI 34.1 0.2 5.2    Sleep (min) TST (%) REM (min) NREM (min) CA (#) OA (#) MA (#) HYP (#) AHI (#/h) RERA (#) RDI (#/h) Desat (#)  Supine 10.3 3.40 0.0 10.3 0 0 0 0 0.0 0 0.00 0  Non-Supine 292.20 96.60 44.00 248.20 0.00 0.00 0.00 25.00 5.13 1.00 5.34 45.00  Left: 26.5 8.76 0.0 26.5 0 0 0 0 0.0 0 0.00 0  Right: 265.7  87.83 44.0 221.7 0 0 0 25 5.6 1 5.9 45     Snoring: Total number of snoring episodes  0  Total time with snoring    min (   % of sleep)   Oximetry Distribution:             WK REM NREM TOTAL  Average (%)   94 90 94 93  < 90% 0.5 14.5 9.1 24.1  < 80% 0.1 0.0 0.0 0.1  < 70% 0.1 0.0 0.0 0.1  # of Desaturations* 4 30 11  45  Desat Index (#/hour) 2.4 40.9 2.6 8.9  Desat Max (%) 6 11 13 13   Desat Max Dur (sec) 18.0 77.0 120.0 120.0  Approx Min O2 during sleep 79  Approx min O2 during a respiratory event 79  Was Oxygen added (Y/N) and final rate No:   0 LPM  *Desaturations based  on 4% or greater drop from baseline.   Cheyne Stokes Breathing: None Present   Heart Rate Summary:  Average Heart Rate During Sleep 91.8 bpm      Highest Heart Rate During Sleep (95th %) 100.0 bpm      Highest Heart Rate During Sleep 198 bpm (artifact)  Highest Heart Rate During Recording (TIB) 209 bpm (artifact)   Heart Rate Observations: Event Type # Events   Bradycardia 0 Lowest HR Scored: N/A  Sinus Tachycardia During Sleep 0 Highest HR Scored: N/A  Narrow Complex Tachycardia 0 Highest HR Scored: N/A  Wide Complex Tachycardia 0 Highest HR Scored: N/A  Asystole 0 Longest Pause: N/A  Atrial Fibrillation 0 Duration Longest Event: N/A  Other Arrythmias  No Type:   Periodic Limb Movement Data: (Primary legs unless otherwise noted) Total # Limb Movement 341 Limb Movement Index 67.6  Total # PLMS 337 PLMS Index 66.8  Total # PLMS Arousals 2 PLMS Arousal Index 0.4  Percentage Sleep Time with PLMS 135.43mn (44.9 % sleep)  Mean Duration limb movements (secs) 1018.0    IPAP Level (cmH2O) EPAP Level (cmH2O) Total Duration (min) Sleep Duration (min) Sleep (%) REM (%) CA  #) OA # MA # HYP #) AHI (#/hr) RERAs # RERAs (#/hr) RDI (#/hr)  4 4 304.3 248.1 81.5 2.6 0 0 0 8 1.9 0 0.0 1.9  5 5  12.2 12.2 100.0 41.0 0 0 0 5 24.6 0 0.0 24.6  7 7  8.4 8.4 100.0 100.0 0 0 0 5 35.7 0 0.0 35.7  8 8  12.3 12.3 100.0 100.0 0  0 0 3 14.6 1 4.9 19.5  9 9  19.6 19.6 100.0 46.4 0 0 0 4 12.2 0 0.0 12.2

## 2021-12-02 ENCOUNTER — Other Ambulatory Visit: Payer: Self-pay | Admitting: Internal Medicine

## 2021-12-02 ENCOUNTER — Encounter: Payer: Self-pay | Admitting: Internal Medicine

## 2021-12-02 MED ORDER — PROPRANOLOL HCL ER 60 MG PO CP24
60.0000 mg | ORAL_CAPSULE | Freq: Every day | ORAL | 1 refills | Status: DC
Start: 2021-12-02 — End: 2022-02-18

## 2021-12-11 ENCOUNTER — Telehealth (INDEPENDENT_AMBULATORY_CARE_PROVIDER_SITE_OTHER): Payer: BC Managed Care – PPO | Admitting: Nurse Practitioner

## 2021-12-11 ENCOUNTER — Encounter: Payer: Self-pay | Admitting: Nurse Practitioner

## 2021-12-11 VITALS — BP 126/78 | HR 78 | Temp 99.1°F | Resp 16 | Ht 66.0 in | Wt 195.4 lb

## 2021-12-11 DIAGNOSIS — K7460 Unspecified cirrhosis of liver: Secondary | ICD-10-CM | POA: Diagnosis not present

## 2021-12-11 DIAGNOSIS — J029 Acute pharyngitis, unspecified: Secondary | ICD-10-CM | POA: Diagnosis not present

## 2021-12-11 DIAGNOSIS — K769 Liver disease, unspecified: Secondary | ICD-10-CM | POA: Diagnosis not present

## 2021-12-11 NOTE — Progress Notes (Signed)
[facility] [place], [place] [address]  Internal MEDICINE  Telephone Visit  Patient Name: Danielle Wallace  [id number]  [id number]  Date of Service: 12/11/2021  I connected with the patient at 1430 by telephone and verified the patients identity using two identifiers.   I discussed the limitations, risks, security and privacy concerns of performing an evaluation and management service by telephone and the availability of in person appointments. I also discussed with the patient that there may be a patient responsible charge related to the service.  The patient expressed understanding and agrees to proceed.    Chief Complaint  Patient presents with   Acute Visit    Stomach ache, neg covid test. Everything started Saturday    Fever    100.3 last night   Sore Throat    In the evening and some horseness    Telephone Assessment    video   Telephone Screen    [phone]     HPI Danielle Wallace presents for a telehealth virtual visit for low-grade fever, stomachache, sore throat and hoarseness.  This has been ongoing since Saturday and she typically feels fine in the morning but feels worse by the evening.  She does have fatigue but denies any body aches.  She does have some nausea decreased appetite but denies any vomiting or diarrhea.  Discussed recent diagnosis of cirrhosis, she does have some upcoming follow-up appointments and an upper endoscopy scheduled.  Discussed possible symptoms of early cirrhosis that she might experience to but also stressed that early cirrhosis can be asymptomatic or only mildly symptomatic.  Discussed fluid accumulation and ascites with the patient.   Current Medication: Outpatient Encounter Medications as of 12/11/2021  Medication Sig   albuterol (VENTOLIN HFA) 108 (90 Base) MCG/ACT inhaler TAKE 2 PUFFS BY MOUTH EVERY 6 HOURS AS NEEDED FOR WHEEZE OR SHORTNESS OF BREATH   aspirin EC 81 MG tablet Take 1 tablet (81 mg total) by mouth daily.  Swallow whole.   B COMPLEX-C PO Take by mouth daily.   Budeson-Glycopyrrol-Formoterol (BREZTRI AEROSPHERE) 160-9-4.8 MCG/ACT AERO Inhale 2 puffs into the lungs 2 (two) times daily. Pt needs 3 month supply   busPIRone (BUSPAR) 10 MG tablet Take 5 mg by mouth 2 (two) times daily.   Calcium Carb-Cholecalciferol (CALCIUM + D3) 600-200 MG-UNIT TABS Take 1 tablet by mouth 2 (two) times daily.   diphenhydrAMINE (BENADRYL) 25 MG tablet Take 25 mg by mouth every 6 (six) hours as needed.   EPINEPHrine (EPIPEN 2-PAK) 0.3 mg/0.3 mL IJ SOAJ injection Inject 0.3 mLs (0.3 mg total) into the skin as needed.   fluticasone (FLONASE) 50 MCG/ACT nasal spray Place 1 spray into both nostrils daily.   furosemide (LASIX) 20 MG tablet Take 20 mg by mouth daily.   glucose blood (ONETOUCH VERIO) test strip Blood sugar testing done QD and as needed  E11.65   imipramine (TOFRANIL) 25 MG tablet TAKE 2 TABLETS AT BEDTIME   insulin degludec (TRESIBA FLEXTOUCH) 100 UNIT/ML FlexTouch Pen Inject 40 Units into the skin daily. Take in the evening, raise dosage by 2 units every 3 days until blood sugar is 130 or below.   Insulin Pen Needle (BD PEN NEEDLE NANO 2ND GEN) 32G X 4 MM MISC Use as directed to inject [name] Daily into skin E11.65   linaclotide (LINZESS) 290 MCG CAPS capsule Take 1 capsule (290 mcg total) by mouth daily before breakfast.   OZEMPIC, 1 MG/DOSE, 4 MG/3ML SOPN INJECT 1 MG UNDER  THE SKIN ONCE A WEEK   pantoprazole (PROTONIX) 40 MG tablet Take 1 tablet (40 mg total) by mouth daily.   pravastatin (PRAVACHOL) 20 MG tablet Take 1 tablet (20 mg total) by mouth daily.   propranolol (INDERAL) 10 MG tablet Take 1 tablet (10 mg total) by mouth 3 (three) times daily. If bp is greater than 140   propranolol ER (INDERAL LA) 60 MG 24 hr capsule Take 1 capsule (60 mg total) by mouth daily.   spironolactone (ALDACTONE) 25 MG tablet TAKE 1 TABLET BY MOUTH TWICE A DAY   tamoxifen (NOLVADEX) 10 MG tablet TAKE 1 TABLET DAILY    traMADol (ULTRAM) 50 MG tablet Take one tab a day prn only for svere pain   No facility-administered encounter medications on file as of 12/11/2021.    Surgical History: Past Surgical History:  Procedure Laterality Date   BREAST BIOPSY Right 2005   negative   BREAST BIOPSY Left 08/23/2007   negative   BREAST BIOPSY Left 10/24/2013   positive   BREAST BIOPSY Left 07/2002   neg   BREAST BIOPSY Left 08/23/2007   neg   BREAST SURGERY Left 10/2011   Cyst Aspirationapocrine metaplasia, ductal cells and bone cells, hypo-cellular   BREAST SURGERY Left 2009   ADH on stereotactic biopsy, 1.5 mm focus.   BREAST SURGERY Left 2003   fibrocystic changes with ductal hyperplasia without atypia.   BREAST SURGERY Left    mastectomy   BREAST SURGERY Left April 11, 2014   Removal of implant, debridement chest wall Many   COLONOSCOPY  08/26/2013   Verdie Shire, M.D. normal.   CRYOABLATION  2005, 2010   CYST REMOVAL NECK  10/2011   Dr. Tami Ribas   ESOPHAGOGASTRODUODENOSCOPY (EGD) WITH PROPOFOL N/A 01/16/2017   Procedure: ESOPHAGOGASTRODUODENOSCOPY (EGD) WITH PROPOFOL;  Surgeon: Lucilla Lame, MD;  Location: [facility];  Service: Gastroenterology;  Laterality: N/A;  Diabetic - oral meds   INCISION AND DRAINAGE Left Dec 2015, Feb 2016   Dr Tula Nakayama   MASTECTOMY Left 11/24/2013   positive   PORTACATH PLACEMENT  11/24/13    Medical History: Past Medical History:  Diagnosis Date   Arthritis 2006   Asthma    Breast cancer ([place]) 10/14/2013   18 mm, T1c, N0; ER/ PR positive,her 2 neu overexpressed. Adjuvant chemo/ herceptin.   Cancer [facility]) 2006   Renal cell carcinoma; cryosurgery treatment, right side   Cirrhosis ([place])    Collapsed lung 2007   Diabetes mellitus without complication ([place]) 4503   Metformin   Diffuse cystic mastopathy    Family history of breast cancer    Family history of liver cancer    Motion sickness    any moving vehicle    Orthodontics    braces   Personal history of chemotherapy    Sinus problem     Family History: Family History  Problem Relation Age of Onset   Liver cancer Father    Hemochromatosis Father    Liver disease Father    Diabetes Mother    Hypertension Mother    Breast cancer Paternal Aunt 75   Ovarian cancer Maternal Grandmother        unk primary, metastatic cancer   Hemachromatosis Daughter    Liver cancer Paternal Uncle    Hemochromatosis Paternal Uncle     Social History   Socioeconomic History   Marital status: Married    Spouse name: Not on  file   Number of children: Not on file   Years of education: Not on file   Highest education level: Not on file  Occupational History   Not on file  Tobacco Use   Smoking status: Never   Smokeless tobacco: Never  Vaping Use   Vaping Use: Never used  Substance and Sexual Activity   Alcohol use: Not Currently    Comment: occasionally, none recently   Drug use: No   Sexual activity: Not on file  Other Topics Concern   Not on file  Social History Narrative   Not on file   Social Determinants of Health   Financial Resource Strain: Not on file  Food Insecurity: Not on file  Transportation Needs: Not on file  Physical Activity: Not on file  Stress: Not on file  Social Connections: Not on file  Intimate Partner Violence: Not on file      Review of Systems  Constitutional:  Positive for appetite change, chills, fatigue and fever.  HENT:  Positive for congestion, postnasal drip and sore throat. Negative for ear pain, rhinorrhea, sinus pressure, sinus pain and sneezing.   Eyes: Negative.   Respiratory:  Negative for cough, chest tightness, shortness of breath and wheezing.   Cardiovascular: Negative.  Negative for chest pain and palpitations.  Gastrointestinal:  Positive for abdominal pain and nausea. Negative for constipation, diarrhea and vomiting.  Musculoskeletal:  Negative for myalgias.  Skin:  Negative for rash.   Neurological:  Negative for headaches.    Vital Signs: BP 126/78   Pulse 78   Temp 99.1 F (37.3 C)   Resp 16   Ht 5' 6"  (1.676 m)   Wt 195 lb 6.4 oz (88.6 kg)   SpO2 97%   BMI 31.54 kg/m    Observation/Objective: She is alert and oriented and engages in conversation appropriately. Video call was attempted but her phone number was invalid and the video call would not send. Over telephone call, she does not sound as though she is in any acute distress.     Assessment/Plan: 1. Acute viral pharyngitis Discussed OTC medications for symptom relief. Call clinic if symptoms worsen or do not improve.   2. Chronic liver disease and cirrhosis ([place]) Patient had questions about possible symptoms and what to expect. Provided information to patient regarding possible symptoms to look out for but that she may be asymptomatic as well.    General Counseling: Danielle Wallace understanding of the findings of today's phone visit and agrees with plan of treatment. I have discussed any further diagnostic evaluation that may be needed or ordered today. We also reviewed her medications today. she has been encouraged to call the office with any questions or concerns that should arise related to todays visit.  Return if symptoms worsen or fail to improve.   No orders of the defined types were placed in this encounter.   No orders of the defined types were placed in this encounter.   Time spent:10 Minutes Time spent with patient included reviewing progress notes, labs, imaging studies, and discussing plan for follow up.  [place] Controlled Substance Database was reviewed by me for overdose risk score (ORS) if appropriate.  This patient was seen by Jonetta Osgood, FNP-C in collaboration with Dr. Clayborn Bigness as a part of collaborative care agreement.  Kamea Dacosta R. Valetta Fuller, MSN, FNP-C Internal medicine

## 2021-12-13 ENCOUNTER — Other Ambulatory Visit: Payer: Self-pay

## 2021-12-13 ENCOUNTER — Telehealth: Payer: Self-pay

## 2021-12-13 MED ORDER — AZITHROMYCIN 250 MG PO TABS: ORAL_TABLET | ORAL | 0 refills | Status: DC

## 2021-12-13 NOTE — Telephone Encounter (Signed)
Pt called stating she is not feeling better. Last night had tmp of 101.8. pt says she feels better in the morning and throughout the day but feels worse at night. She said she has a little bit of a sore throat and back pain (pt threw out her back last weekend). Pt says no more cough or tightness in chest. Spoke to DFK she advised Zpac be sent to pts pharmacy. Spoke to pt and informed her med was sent in.

## 2021-12-15 DIAGNOSIS — R0902 Hypoxemia: Secondary | ICD-10-CM | POA: Diagnosis not present

## 2021-12-16 ENCOUNTER — Encounter: Payer: Self-pay | Admitting: Nurse Practitioner

## 2021-12-20 ENCOUNTER — Other Ambulatory Visit: Payer: Self-pay

## 2021-12-20 ENCOUNTER — Telehealth: Payer: Self-pay

## 2021-12-20 MED ORDER — DOXYCYCLINE HYCLATE 100 MG PO TABS: 100.0000 mg | ORAL_TABLET | Freq: Two times a day (BID) | ORAL | 0 refills | Status: DC

## 2021-12-20 NOTE — Telephone Encounter (Signed)
Pt  called that she is not feeling better still fever ,ear pain and sore throat on and off Covid test is negative and she finished Zpak as per alyssa send doxycycline for 10 days and advised her take with food and take Tylenol for fever  and also gargle with salt water and if she is not feeling better need appt in person

## 2021-12-23 ENCOUNTER — Other Ambulatory Visit: Payer: Self-pay

## 2021-12-23 ENCOUNTER — Emergency Department: Payer: BC Managed Care – PPO

## 2021-12-23 ENCOUNTER — Emergency Department
Admission: EM | Admit: 2021-12-23 | Discharge: 2021-12-23 | Disposition: A | Discharge status: Home / Self Care | Payer: BC Managed Care – PPO | Attending: Emergency Medicine | Admitting: Emergency Medicine

## 2021-12-23 DIAGNOSIS — R1011 Right upper quadrant pain: Secondary | ICD-10-CM | POA: Diagnosis not present

## 2021-12-23 DIAGNOSIS — Z85528 Personal history of other malignant neoplasm of kidney: Secondary | ICD-10-CM | POA: Diagnosis not present

## 2021-12-23 DIAGNOSIS — R079 Chest pain, unspecified: Secondary | ICD-10-CM | POA: Diagnosis not present

## 2021-12-23 DIAGNOSIS — R509 Fever, unspecified: Secondary | ICD-10-CM | POA: Diagnosis not present

## 2021-12-23 DIAGNOSIS — R109 Unspecified abdominal pain: Secondary | ICD-10-CM | POA: Diagnosis not present

## 2021-12-23 DIAGNOSIS — Z853 Personal history of malignant neoplasm of breast: Secondary | ICD-10-CM | POA: Diagnosis not present

## 2021-12-23 DIAGNOSIS — R0602 Shortness of breath: Secondary | ICD-10-CM | POA: Insufficient documentation

## 2021-12-23 DIAGNOSIS — J45909 Unspecified asthma, uncomplicated: Secondary | ICD-10-CM | POA: Insufficient documentation

## 2021-12-23 DIAGNOSIS — E119 Type 2 diabetes mellitus without complications: Secondary | ICD-10-CM | POA: Insufficient documentation

## 2021-12-23 DIAGNOSIS — I700 Atherosclerosis of aorta: Secondary | ICD-10-CM | POA: Diagnosis not present

## 2021-12-23 DIAGNOSIS — R100 Acute abdomen: Secondary | ICD-10-CM | POA: Diagnosis not present

## 2021-12-23 DIAGNOSIS — R1012 Left upper quadrant pain: Secondary | ICD-10-CM | POA: Diagnosis not present

## 2021-12-23 DIAGNOSIS — J9811 Atelectasis: Secondary | ICD-10-CM | POA: Diagnosis not present

## 2021-12-23 DIAGNOSIS — Z9049 Acquired absence of other specified parts of digestive tract: Secondary | ICD-10-CM | POA: Diagnosis not present

## 2021-12-23 LAB — URINALYSIS, ROUTINE W REFLEX MICROSCOPIC
Bilirubin Urine: NEGATIVE
Glucose, UA: NEGATIVE mg/dL
Hgb urine dipstick: NEGATIVE
Ketones, ur: 5 mg/dL — AB
Leukocytes,Ua: NEGATIVE
Nitrite: NEGATIVE
Protein, ur: NEGATIVE mg/dL
Specific Gravity, Urine: 1.019 (ref 1.005–1.030)
pH: 7 (ref 5.0–8.0)

## 2021-12-23 LAB — CBC
HCT: 39.8 % (ref 36.0–46.0)
Hemoglobin: 13.2 g/dL (ref 12.0–15.0)
MCH: 30 pg (ref 26.0–34.0)
MCHC: 33.2 g/dL (ref 30.0–36.0)
MCV: 90.5 fL (ref 80.0–100.0)
Platelets: 380 10*3/uL (ref 150–400)
RBC: 4.4 MIL/uL (ref 3.87–5.11)
RDW: 11.7 % (ref 11.5–15.5)
WBC: 13.6 10*3/uL — ABNORMAL HIGH (ref 4.0–10.5)
nRBC: 0 % (ref 0.0–0.2)

## 2021-12-23 LAB — COMPREHENSIVE METABOLIC PANEL
ALT: 30 U/L (ref 0–44)
AST: 42 U/L — ABNORMAL HIGH (ref 15–41)
Albumin: 3.2 g/dL — ABNORMAL LOW (ref 3.5–5.0)
Alkaline Phosphatase: 81 U/L (ref 38–126)
Anion gap: 11 (ref 5–15)
BUN: 14 mg/dL (ref 8–23)
CO2: 25 mmol/L (ref 22–32)
Calcium: 8.9 mg/dL (ref 8.9–10.3)
Chloride: 101 mmol/L (ref 98–111)
Creatinine, Ser: 0.65 mg/dL (ref 0.44–1.00)
GFR, Estimated: >60 mL/min (ref >60)
Glucose, Bld: 104 mg/dL — ABNORMAL HIGH (ref 70–99)
Potassium: 4 mmol/L (ref 3.5–5.1)
Sodium: 137 mmol/L (ref 135–145)
Total Bilirubin: 0.6 mg/dL (ref 0.3–1.2)
Total Protein: 7.3 g/dL (ref 6.5–8.1)

## 2021-12-23 LAB — TROPONIN I (HIGH SENSITIVITY): Troponin I (High Sensitivity): 3 ng/L (ref <18)

## 2021-12-23 LAB — LIPASE, BLOOD: Lipase: 37 U/L (ref 11–51)

## 2021-12-23 MED ORDER — HYDROMORPHONE HCL 1 MG/ML IJ SOLN
0.5000 mg | Freq: Once | INTRAMUSCULAR | Status: AC
  Administered 2021-12-23: 0.5 mg via INTRAVENOUS
  Filled 2021-12-23: qty 0.5

## 2021-12-23 MED ORDER — ONDANSETRON HCL 4 MG/2ML IJ SOLN
4.0000 mg | Freq: Once | INTRAMUSCULAR | Status: AC
  Administered 2021-12-23: 4 mg via INTRAVENOUS
  Filled 2021-12-23: qty 2

## 2021-12-23 MED ORDER — IOHEXOL 350 MG/ML SOLN
100.0000 mL | Freq: Once | INTRAVENOUS | Status: AC | PRN
  Administered 2021-12-23: 100 mL via INTRAVENOUS

## 2021-12-23 MED ORDER — SODIUM CHLORIDE 0.9 % IV BOLUS
1000.0000 mL | Freq: Once | INTRAVENOUS | Status: AC
  Administered 2021-12-23: 1000 mL via INTRAVENOUS

## 2021-12-23 NOTE — ED Notes (Signed)
Pt complains of right side abd pain with nausea and fever. Pt states the pain comes and goes.

## 2021-12-23 NOTE — ED Triage Notes (Addendum)
Pt reports RUQ pain that started two weeks ago and has been intermittent. Pt reports pain came back this morning and hasn't eased up and radiates to back and bottom of rib cage. Pt reports took tylenol with some relief. Pt states she has notice some urinary retention. Pt denies hx kidney stones but states does have hx of cysts. Pt also reports taking breztri for COPD.   Pt reports gallbladder removal and 1/2 right kidney and hx cirrhosis. Pt on 3L [place] chronically.

## 2021-12-23 NOTE — Discharge Instructions (Addendum)
Your blood work and CT of your chest and abdomen were all reassuring today.  Please follow-up with your primary care doctor for review.  If you have any new or worsening symptoms that are concerning to you do not hesitate to return the emergency department.

## 2021-12-23 NOTE — ED Provider Triage Note (Signed)
Emergency Medicine Provider Triage Evaluation Note  Danielle Wallace , a 61 y.o. female  was evaluated in triage.  Pt complains of RUQ intermittent for a few weeks, and now constant. H/o cholecystectomy in 1991. H/o cirrhosis. No vomiting. No diarrhea. No fever or chills. Pain with inspiration. No huistory of PE/DVT. No history of blood clots.  Review of Systems  Positive: RUQ pain Negative: N/v/d, fever/chills  Physical Exam  BP 127/73   Pulse 89   Temp 98 F (36.7 C) (Oral)   Resp (!) 22   Ht 5' 6"  (1.676 m)   Wt 88 kg   SpO2 98%   BMI 31.31 kg/m  Gen:   Awake, no distress   Resp:  Normal effort. On 3L baseline MSK:   Moves extremities without difficulty  Other:    Medical Decision Making  Medically screening exam initiated at 3:24 PM.  Appropriate orders placed.  TARON CONREY was informed that the remainder of the evaluation will be completed by another provider, this initial triage assessment does not replace that evaluation, and the importance of remaining in the ED until their evaluation is complete.     Marquette Old, PA-C 12/23/21 1526

## 2021-12-24 DIAGNOSIS — G4733 Obstructive sleep apnea (adult) (pediatric): Secondary | ICD-10-CM | POA: Diagnosis not present

## 2021-12-24 NOTE — ED Provider Notes (Signed)
[facility] Provider Note    Event Date/Time   First MD Initiated Contact with Patient 12/23/21 2037     (approximate)   History   Abdominal Pain   HPI  KIMBLE DELAURENTIS is a 61 y.o. female with past medical history of alpha 1 antitrypsin deficiency, renal cell carcinoma, breast cancer who presents with right-sided abdominal pain.  Patient endorses pain in the right flank/right mid abdomen.  This for started about 2 weeks ago and lasted for a day or so.  She then felt this again several days ago and it resolved and came back again last night and has not gone away.  Pain is worse with breathing worse with lying on that side and with movement.  She has had decreased appetite over the last 3 weeks and intermittent fevers.  She says she is taken her temperature multiple times in the evening has been as high as 101 although of late has been less than 100.  She denies significant vomiting is having nausea no diarrhea denies urinary symptoms.  Denies history of similar pain.  She denies frank chest pain does feel mildly short of breath.     Past Medical History:  Diagnosis Date   Arthritis 2006   Asthma    Breast cancer ([place]) 10/14/2013   18 mm, T1c, N0; ER/ PR positive,her 2 neu overexpressed. Adjuvant chemo/ herceptin.   Cancer [facility]) 2006   Renal cell carcinoma; cryosurgery treatment, right side   Cirrhosis ([place])    Collapsed lung 2007   Diabetes mellitus without complication ([place]) 1749   Metformin   Diffuse cystic mastopathy    Family history of breast cancer    Family history of liver cancer    Motion sickness    any moving vehicle   Orthodontics    braces   Personal history of chemotherapy    Sinus problem     Patient Active Problem List   Diagnosis Date Noted   Complex tear of medial meniscus of right knee as current injury 03/04/2020   Aortic atherosclerosis ([place]) 03/04/2020   Osteoarthritis of knee 03/02/2020   Encounter for general adult  medical examination with abnormal findings 01/31/2020   Primary osteoarthritis of both knees 01/31/2020   Degenerative disc disease, lumbar 01/31/2020   Genetic testing 11/16/2019   Family history of breast cancer    Family history of liver cancer    Multinodular goiter 10/10/2019   Thyromegaly 09/19/2019   Allergic reaction 07/01/2019   Breast cancer of upper-outer quadrant of left female breast ([place]) 10/27/2018   Routine cervical smear 10/19/2018   Uncontrolled type 2 diabetes mellitus with hyperglycemia ([place]) 10/19/2018   Gastroesophageal reflux disease with esophagitis 10/19/2018   Stomatitis and mucositis 10/19/2018   Urinary tract infection without hematuria 10/19/2018   Dysuria 10/19/2018   Need for vaccination against Streptococcus pneumoniae using pneumococcal conjugate vaccine 13 04/21/2018   Acute bronchitis 10/01/2017   Wheezing 10/01/2017   Sore throat 10/01/2017   Fatigue 09/23/2017   Vitamin D deficiency 09/23/2017   Anemia 09/23/2017   Acute non-recurrent pansinusitis 08/12/2017   Type 2 diabetes mellitus with hyperglycemia, without long-term current use of insulin ([place]) 08/12/2017   Epstein Barr virus infection 08/12/2017   Essential hypertension 05/18/2017   Abnormal CT scan, stomach    Gastric varices    Cellulitis of female breast 12/11/2016   Open wound of breast 12/11/2016   Abdominal distention 01/23/2016   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 12/12/2014   Nausea  09/22/2014   EN (erythema nodosum) 07/26/2014   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   Personal history of renal cancer 05/23/2014   Personal history of other malignant neoplasm of kidney 05/23/2014   Right flank pain 05/23/2014   Carcinoma of upper-outer quadrant of left breast in female, estrogen receptor positive ([place]) 10/14/2013   Calcium blood increased 06/25/2012   Cancer of kidney ([place]) 06/25/2012   Malignant neoplasm of kidney ([place]) 06/25/2012   Female stress incontinence 06/25/2012    Diabetes mellitus without complication ([place]) 86/76/1951     Physical Exam  Triage Vital Signs: ED Triage Vitals  Enc Vitals Group     BP 12/23/21 1439 127/73     Pulse Rate 12/23/21 1439 89     Resp 12/23/21 1439 (!) 22     Temp 12/23/21 1439 98 F (36.7 C)     Temp Source 12/23/21 1439 Oral     SpO2 12/23/21 1439 98 %     Weight 12/23/21 1523 194 lb 0.1 oz (88 kg)     Height 12/23/21 1523 5' 6"  (1.676 m)     Head Circumference --      Peak Flow --      Pain Score 12/23/21 1523 10     Pain Loc --      Pain Edu? --      Excl. in [place]? --     Most recent vital signs: Vitals:   12/23/21 2130 12/23/21 2358  BP: 129/69 132/67  Pulse: 100 87  Resp: (!) 32 18  Temp:    SpO2: 98% 98%     General: Awake, no distress.  CV:  Good peripheral perfusion.  No peripheral edema Resp:  Normal effort.  Lungs are clear no increased work of breathing Abd:  No distention.  Abdomen mildly tender in the right upper/mid abdomen as well as the left upper quadrant Neuro:             Awake, Alert, Oriented x 3  Other:  No CVA tenderness   ED Results / Procedures / Treatments  Labs (all labs ordered are listed, but only abnormal results are displayed) Labs Reviewed  COMPREHENSIVE METABOLIC PANEL - Abnormal; Notable for the following components:      Result Value   Glucose, Bld 104 (*)    Albumin 3.2 (*)    AST 42 (*)    All other components within normal limits  CBC - Abnormal; Notable for the following components:   WBC 13.6 (*)    All other components within normal limits  URINALYSIS, ROUTINE W REFLEX MICROSCOPIC - Abnormal; Notable for the following components:   Color, Urine YELLOW (*)    APPearance HAZY (*)    Ketones, ur 5 (*)    All other components within normal limits  LIPASE, BLOOD  TROPONIN I (HIGH SENSITIVITY)  TROPONIN I (HIGH SENSITIVITY)     EKG  EKG interpretation performed by myself: NSR, nml axis, nml intervals, no acute ischemic changes    RADIOLOGY CT  abdomen pelvis interpreted myself is negative for acute process   PROCEDURES:  Critical Care performed: No  .1-3 Lead EKG Interpretation  Performed by: Rada Hay, MD Authorized by: Rada Hay, MD     Interpretation: normal     ECG rate assessment: normal     Rhythm: sinus rhythm     Ectopy: none     Conduction: normal     The patient is on the cardiac monitor to evaluate for evidence  of arrhythmia and/or significant heart rate changes.   MEDICATIONS ORDERED IN ED: Medications  sodium chloride 0.9 % bolus 1,000 mL (0 mLs Intravenous Stopped 12/23/21 2328)  HYDROmorphone (DILAUDID) injection 0.5 mg (0.5 mg Intravenous Given 12/23/21 2201)  ondansetron (ZOFRAN) injection 4 mg (4 mg Intravenous Given 12/23/21 2202)  iohexol (OMNIPAQUE) 350 MG/ML injection 100 mL (100 mLs Intravenous Contrast Given 12/23/21 2208)     IMPRESSION / MDM / ASSESSMENT AND PLAN / ED COURSE  I reviewed the triage vital signs and the nursing notes.                              Patient's presentation is most consistent with acute presentation with potential threat to life or bodily function.  Differential diagnosis includes, but is not limited to, choledocholithiasis, hepatic abscess, kidney stone, pyelonephritis, pulmonary embolism, lower lobe pneumonia portal vein thrombosis  Patient is 61 year old female with cirrhosis from alpha-1 antitrypsin deficiency presents with right flank/mid upper abdominal pain.  She has also had fevers decreased appetite shortness of breath for about 3 weeks total.  This abdominal pain recurred 2 weeks ago initially resolved and then came back again yesterday it is in the right flank region worse with movement and with lying on that side and with breathing.  She is also mildly short of breath fevers as high as 101 at home in the evening over the last 3 weeks on and off but not having fevers over the last several days.  Has completed a course of azithromycin and is  taking doxycycline currently for unclear reasons.  Overall looks well and nontoxic she is not febrile here vitals are within normal limits.  She is tender in the right upper and right mid abdomen there is no CVA tenderness.  No signs of DVT.  Labs overall reassuring just mild leukocytosis to 13 normal LFTs and lipase, troponin is negative.  UA does not suggest infection.  Given the fevers and pain in the thoracoabdominal region I obtained a CT chest abdomen pelvis to evaluate for PE and other infectious process in the chest or abdomen.  These are essentially negative there is no pulmonary embolism no evidence of infection in the right upper quadrant or kidneys.  Do not currently have an explanation for her pain which I explained to her.  She does have PCP follow-up and is seeing specialist at [facility] for her alpha-1 antitrypsin deficiency.  Discussed return to ED for any new symptoms as well as the planned outpatient follow-up.     FINAL CLINICAL IMPRESSION(S) / ED DIAGNOSES   Final diagnoses:  Right sided abdominal pain     Rx / DC Orders   ED Discharge Orders     None        Note:  This document was prepared using Dragon voice recognition software and may include unintentional dictation errors.   Rada Hay, MD 12/24/21 [id number]

## 2021-12-25 ENCOUNTER — Telehealth: Payer: Self-pay | Admitting: Gastroenterology

## 2021-12-25 ENCOUNTER — Encounter: Payer: Self-pay | Admitting: Nurse Practitioner

## 2021-12-25 ENCOUNTER — Ambulatory Visit (INDEPENDENT_AMBULATORY_CARE_PROVIDER_SITE_OTHER): Payer: BC Managed Care – PPO | Admitting: Nurse Practitioner

## 2021-12-25 VITALS — BP 114/69 | HR 95 | Temp 97.9°F | Resp 16 | Ht 66.0 in | Wt 196.8 lb

## 2021-12-25 DIAGNOSIS — M1990 Unspecified osteoarthritis, unspecified site: Secondary | ICD-10-CM

## 2021-12-25 DIAGNOSIS — K7460 Unspecified cirrhosis of liver: Secondary | ICD-10-CM

## 2021-12-25 DIAGNOSIS — K7681 Hepatopulmonary syndrome: Secondary | ICD-10-CM | POA: Diagnosis not present

## 2021-12-25 DIAGNOSIS — R188 Other ascites: Secondary | ICD-10-CM | POA: Diagnosis not present

## 2021-12-25 NOTE — Progress Notes (Signed)
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 Allen Norris but states that she is unsure and having second thoughts especially if he plans on doing esophageal banding during the procedure. Thinking of waiting to see hepatologist at [facility].  Wants something to take for severe pain. Especially for her arthritis since she can no longer take NSAIDs.    Current Medication: Outpatient Encounter Medications as of 12/25/2021  Medication Sig   albuterol (VENTOLIN HFA) 108 (90 Base) MCG/ACT inhaler TAKE 2 PUFFS BY MOUTH EVERY 6 HOURS AS NEEDED FOR WHEEZE OR SHORTNESS OF BREATH   aspirin EC 81 MG tablet Take 1 tablet (81 mg total) by mouth daily. Swallow whole.   B COMPLEX-C PO Take by mouth daily.   Budeson-Glycopyrrol-Formoterol (BREZTRI AEROSPHERE) 160-9-4.8 MCG/ACT AERO Inhale 2 puffs into the lungs 2 (two) times daily. Pt needs 3 month supply   busPIRone (BUSPAR) 10 MG tablet Take 5 mg by mouth 2 (two) times daily.   Calcium Carb-Cholecalciferol (CALCIUM + D3) 600-200 MG-UNIT TABS Take 1 tablet by mouth 2 (two) times daily.   diphenhydrAMINE (BENADRYL) 25 MG tablet Take 25 mg by mouth every 6 (six) hours as needed.   doxycycline (VIBRA-TABS) 100 MG tablet Take 1 tablet (100 mg total) by mouth 2 (two) times daily.   EPINEPHrine (EPIPEN 2-PAK) 0.3 mg/0.3 mL IJ SOAJ injection Inject 0.3 mLs (0.3 mg total) into the skin as needed.   furosemide (LASIX) 20 MG tablet Take 20  mg by mouth daily.   glucose blood (ONETOUCH VERIO) test strip Blood sugar testing done QD and as needed  E11.65   imipramine (TOFRANIL) 25 MG tablet TAKE 2 TABLETS AT BEDTIME   Insulin Pen Needle (BD PEN NEEDLE NANO 2ND GEN) 32G X 4 MM MISC Use as directed to inject [name] Daily into skin E11.65   OZEMPIC, 1 MG/DOSE, 4 MG/3ML SOPN INJECT 1 MG UNDER THE SKIN ONCE A WEEK   pantoprazole (PROTONIX) 40 MG tablet Take 1 tablet (40 mg total) by mouth daily.   pravastatin (PRAVACHOL) 20 MG tablet Take 1 tablet (20 mg total) by mouth daily.   propranolol (INDERAL) 10 MG tablet Take 1 tablet (10 mg total) by mouth 3 (three) times daily. If bp is greater than 140   tamoxifen (NOLVADEX) 10 MG tablet TAKE 1 TABLET DAILY   [DISCONTINUED] fluticasone (FLONASE) 50 MCG/ACT nasal spray Place 1 spray into both nostrils daily.   [DISCONTINUED] insulin degludec (TRESIBA FLEXTOUCH) 100 UNIT/ML FlexTouch Pen Inject 40 Units into the skin daily. Take in the evening, raise dosage by 2 units every 3 days until blood sugar is 130 or below.   [DISCONTINUED] linaclotide (LINZESS) 290 MCG CAPS capsule Take 1 capsule (290 mcg total) by mouth daily before breakfast.   [DISCONTINUED] propranolol ER (INDERAL LA) 60 MG 24 hr capsule Take 1 capsule (60 mg total) by mouth daily.   [DISCONTINUED] spironolactone (ALDACTONE) 25 MG tablet TAKE 1 TABLET BY  MOUTH TWICE A DAY   [DISCONTINUED] traMADol (ULTRAM) 50 MG tablet Take one tab a day prn only for svere pain   [DISCONTINUED] traMADol (ULTRAM) 50 MG tablet Take 1 tablet (50 mg total) by mouth 2 (two) times daily as needed for severe pain.   No facility-administered encounter medications on file as of 12/25/2021.    Surgical History: Past Surgical History:  Procedure Laterality Date   BREAST BIOPSY Right 2005   negative   BREAST BIOPSY Left 08/23/2007   negative   BREAST BIOPSY Left 10/24/2013   positive   BREAST BIOPSY Left 07/2002   neg   BREAST BIOPSY Left 08/23/2007   neg    BREAST SURGERY Left 10/2011   Cyst Aspirationapocrine metaplasia, ductal cells and bone cells, hypo-cellular   BREAST SURGERY Left 2009   ADH on stereotactic biopsy, 1.5 mm focus.   BREAST SURGERY Left 2003   fibrocystic changes with ductal hyperplasia without atypia.   BREAST SURGERY Left    mastectomy   BREAST SURGERY Left April 11, 2014   Removal of implant, debridement chest wall [place]   COLONOSCOPY  08/26/2013   Verdie Shire, M.D. normal.   CRYOABLATION  2005, 2010   CYST REMOVAL NECK  10/2011   Dr. Tami Ribas   ESOPHAGOGASTRODUODENOSCOPY (EGD) WITH PROPOFOL N/A 01/16/2017   Procedure: ESOPHAGOGASTRODUODENOSCOPY (EGD) WITH PROPOFOL;  Surgeon: Lucilla Lame, MD;  Location: [facility];  Service: Gastroenterology;  Laterality: N/A;  Diabetic - oral meds   INCISION AND DRAINAGE Left Dec 2015, Feb 2016   Dr Tula Nakayama   MASTECTOMY Left 11/24/2013   positive   PORTACATH PLACEMENT  11/24/13    Medical History: Past Medical History:  Diagnosis Date   Arthritis 2006   Asthma    Breast cancer ([place]) 10/14/2013   18 mm, T1c, N0; ER/ PR positive,her 2 neu overexpressed. Adjuvant chemo/ herceptin.   Cancer [facility]) 2006   Renal cell carcinoma; cryosurgery treatment, right side   Cirrhosis ([place])    Collapsed lung 2007   Diabetes mellitus without complication ([place]) 5035   Metformin   Diffuse cystic mastopathy    Family history of breast cancer    Family history of liver cancer    Motion sickness    any moving vehicle   Orthodontics    braces   Personal history of chemotherapy    Sinus problem     Family History: Family History  Problem Relation Age of Onset   Liver cancer Father    Hemochromatosis Father    Liver disease Father    Diabetes Mother    Hypertension Mother    Breast cancer Paternal Aunt 21   Ovarian cancer Maternal Grandmother        unk primary, metastatic cancer   Hemachromatosis Daughter    Liver cancer  Paternal Uncle    Hemochromatosis Paternal Uncle     Social History   Socioeconomic History   Marital status: Married    Spouse name: Not on file   Number of children: Not on file   Years of education: Not on file   Highest education level: Not on file  Occupational History   Not on file  Tobacco Use   Smoking status: Never   Smokeless tobacco: Never  Vaping Use   Vaping Use: Never used  Substance and Sexual Activity   Alcohol use: Not Currently    Comment: occasionally, none recently   Drug use: No  Sexual activity: Not on file  Other Topics Concern   Not on file  Social History Narrative   Not on file   Social Determinants of Health   Financial Resource Strain: Not on file  Food Insecurity: Not on file  Transportation Needs: Not on file  Physical Activity: Not on file  Stress: Not on file  Social Connections: Not on file  Intimate Partner Violence: Not on file      Review of Systems  Constitutional:  Positive for fatigue. Negative for chills and unexpected weight change.  HENT:  Negative for congestion, rhinorrhea, sneezing and sore throat.   Eyes:  Negative for redness.  Respiratory:  Positive for shortness of breath. Negative for cough, chest tightness and wheezing.   Cardiovascular: Negative.  Negative for chest pain and palpitations.  Gastrointestinal:  Positive for abdominal distention, abdominal pain and nausea. Negative for constipation, diarrhea and vomiting.  Genitourinary:  Negative for dysuria and frequency.  Musculoskeletal:  Negative for arthralgias, back pain, joint swelling and neck pain.  Skin:  Negative for rash.  Neurological:  Positive for headaches. Negative for tremors and numbness.  Hematological:  Negative for adenopathy. Does not bruise/bleed easily.  Psychiatric/Behavioral:  Negative for behavioral problems (Depression), sleep disturbance and suicidal ideas. The patient is not nervous/anxious.     Vital Signs: BP 114/69   Pulse 95    Temp 97.9 F (36.6 C)   Resp 16   Ht 5' 6"  (1.676 m)   Wt 196 lb 13.6 oz (89.3 kg)   SpO2 97%   BMI 31.77 kg/m    Physical Exam Vitals reviewed.  Constitutional:      General: She is not in acute distress.    Appearance: Normal appearance. She is obese. She is not ill-appearing.  HENT:     Head: Normocephalic and atraumatic.  Eyes:     Pupils: Pupils are equal, round, and reactive to light.  Cardiovascular:     Rate and Rhythm: Normal rate and regular rhythm.  Pulmonary:     Effort: Pulmonary effort is normal. No respiratory distress.  Abdominal:     General: Bowel sounds are normal. There is distension.     Palpations: There is fluid wave and hepatomegaly. There is no mass or pulsatile mass.     Tenderness: There is generalized abdominal tenderness.  Neurological:     Mental Status: She is alert and oriented to person, place, and time.  Psychiatric:        Mood and Affect: Mood normal.        Behavior: Behavior normal.        Assessment/Plan: 1. Cirrhosis of liver with ascites, unspecified hepatic cirrhosis type [facility]) Patient cancelled endoscopy with Dr. 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 Arthritis Prn tramadol prescribed for severe pain   General Counseling: jasmeet manton understanding of the findings of todays visit and agrees with plan of treatment. I have discussed any further diagnostic evaluation that may be needed or ordered today. We also reviewed her medications today. she has been encouraged to call the office with any questions or concerns that should arise related to todays visit.    No orders of the defined types were placed in this encounter.   Meds ordered this encounter  Medications   DISCONTD: traMADol (ULTRAM) 50 MG tablet    Sig: Take 1 tablet (50 mg total) by  mouth 2 (two) times daily as needed for severe pain.     Dispense:  60 tablet    Refill:  1    Please note this is an increase in frequency from once a day prn to twice daily prn    Return in about 3 months (around 03/27/2022) for and upcoming appts with [facility] pulmonologist and hepatologist..   Total time spent:30 Minutes Time spent includes review of chart, medications, test results, and follow up plan with the patient.   [place] Controlled Substance Database was reviewed by me.  This patient was seen by Jonetta Osgood, FNP-C in collaboration with Dr. Clayborn Bigness as a part of collaborative care agreement.   Caprice Mccaffrey R. Valetta Fuller, MSN, FNP-C Internal medicine

## 2021-12-25 NOTE — Telephone Encounter (Signed)
PT  called and cancelled her EGD and also her in office appointment . Pt will call back at a later date to resched

## 2021-12-26 ENCOUNTER — Other Ambulatory Visit: Payer: Self-pay | Admitting: Physician Assistant

## 2021-12-26 DIAGNOSIS — J4520 Mild intermittent asthma, uncomplicated: Secondary | ICD-10-CM | POA: Diagnosis not present

## 2021-12-26 DIAGNOSIS — K7681 Hepatopulmonary syndrome: Secondary | ICD-10-CM | POA: Diagnosis not present

## 2021-12-26 DIAGNOSIS — Z6832 Body mass index (BMI) 32.0-32.9, adult: Secondary | ICD-10-CM | POA: Diagnosis not present

## 2021-12-26 DIAGNOSIS — R0602 Shortness of breath: Secondary | ICD-10-CM | POA: Diagnosis not present

## 2021-12-26 DIAGNOSIS — Z148 Genetic carrier of other disease: Secondary | ICD-10-CM | POA: Diagnosis not present

## 2021-12-30 ENCOUNTER — Ambulatory Visit: Payer: BC Managed Care – PPO | Admitting: Gastroenterology

## 2021-12-30 DIAGNOSIS — K7460 Unspecified cirrhosis of liver: Secondary | ICD-10-CM | POA: Diagnosis not present

## 2021-12-30 DIAGNOSIS — R0902 Hypoxemia: Secondary | ICD-10-CM | POA: Diagnosis not present

## 2021-12-30 DIAGNOSIS — I5030 Unspecified diastolic (congestive) heart failure: Secondary | ICD-10-CM | POA: Diagnosis not present

## 2021-12-30 DIAGNOSIS — I5189 Other ill-defined heart diseases: Secondary | ICD-10-CM | POA: Diagnosis not present

## 2021-12-30 DIAGNOSIS — Z148 Genetic carrier of other disease: Secondary | ICD-10-CM | POA: Diagnosis not present

## 2021-12-30 DIAGNOSIS — I3481 Nonrheumatic mitral (valve) annulus calcification: Secondary | ICD-10-CM | POA: Diagnosis not present

## 2021-12-31 ENCOUNTER — Encounter: Admission: RE | Payer: Self-pay | Source: Ambulatory Visit

## 2021-12-31 ENCOUNTER — Ambulatory Visit
Admission: RE | Admit: 2021-12-31 | Payer: BC Managed Care – PPO | Source: Ambulatory Visit | Admitting: Gastroenterology

## 2021-12-31 SURGERY — ESOPHAGOGASTRODUODENOSCOPY (EGD) WITH PROPOFOL
Anesthesia: General

## 2022-01-01 ENCOUNTER — Encounter: Payer: Self-pay | Admitting: Nurse Practitioner

## 2022-01-02 MED ORDER — TRAMADOL HCL 50 MG PO TABS: 50.0000 mg | ORAL_TABLET | Freq: Two times a day (BID) | ORAL | 1 refills | Status: DC | PRN

## 2022-01-03 ENCOUNTER — Other Ambulatory Visit: Payer: Self-pay | Admitting: Physician Assistant

## 2022-01-03 ENCOUNTER — Other Ambulatory Visit: Payer: Self-pay | Admitting: Internal Medicine

## 2022-01-03 DIAGNOSIS — M1990 Unspecified osteoarthritis, unspecified site: Secondary | ICD-10-CM

## 2022-01-05 ENCOUNTER — Other Ambulatory Visit: Payer: Self-pay | Admitting: Internal Medicine

## 2022-01-05 ENCOUNTER — Encounter: Payer: Self-pay | Admitting: Nurse Practitioner

## 2022-01-05 DIAGNOSIS — R188 Other ascites: Secondary | ICD-10-CM

## 2022-01-05 MED ORDER — LACTULOSE 10 GM/15ML PO SOLN: 10.0000 g | Freq: Two times a day (BID) | ORAL | 3 refills | Status: DC

## 2022-01-06 ENCOUNTER — Telehealth: Payer: Self-pay

## 2022-01-06 DIAGNOSIS — K7460 Unspecified cirrhosis of liver: Secondary | ICD-10-CM | POA: Diagnosis not present

## 2022-01-06 DIAGNOSIS — R188 Other ascites: Secondary | ICD-10-CM | POA: Diagnosis not present

## 2022-01-06 NOTE — Telephone Encounter (Signed)
Cpap Titration>> 03/03/22-Danielle Wallace

## 2022-01-07 LAB — COMPREHENSIVE METABOLIC PANEL
ALT: 38 IU/L — ABNORMAL HIGH (ref 0–32)
AST: 68 IU/L — ABNORMAL HIGH (ref 0–40)
Albumin/Globulin Ratio: 1.2 (ref 1.2–2.2)
Albumin: 3.8 g/dL — ABNORMAL LOW (ref 3.9–4.9)
Alkaline Phosphatase: 98 IU/L (ref 44–121)
BUN/Creatinine Ratio: 14 (ref 12–28)
BUN: 10 mg/dL (ref 8–27)
Bilirubin Total: 0.4 mg/dL (ref 0.0–1.2)
CO2: 25 mmol/L (ref 20–29)
Calcium: 9.2 mg/dL (ref 8.7–10.3)
Chloride: 97 mmol/L (ref 96–106)
Creatinine, Ser: 0.7 mg/dL (ref 0.57–1.00)
Globulin, Total: 3.3 g/dL (ref 1.5–4.5)
Glucose: 136 mg/dL — ABNORMAL HIGH (ref 70–99)
Potassium: 4.7 mmol/L (ref 3.5–5.2)
Sodium: 138 mmol/L (ref 134–144)
Total Protein: 7.1 g/dL (ref 6.0–8.5)
eGFR: 98 mL/min/{1.73_m2} (ref >59)

## 2022-01-08 DIAGNOSIS — K7681 Hepatopulmonary syndrome: Secondary | ICD-10-CM | POA: Diagnosis not present

## 2022-01-08 DIAGNOSIS — R0902 Hypoxemia: Secondary | ICD-10-CM | POA: Diagnosis not present

## 2022-01-11 NOTE — Progress Notes (Signed)
Labs reviewed, will be discussed on next follow up

## 2022-01-15 DIAGNOSIS — R0902 Hypoxemia: Secondary | ICD-10-CM | POA: Diagnosis not present

## 2022-01-20 ENCOUNTER — Ambulatory Visit (INDEPENDENT_AMBULATORY_CARE_PROVIDER_SITE_OTHER): Payer: BC Managed Care – PPO | Admitting: Internal Medicine

## 2022-01-20 ENCOUNTER — Encounter: Payer: Self-pay | Admitting: Internal Medicine

## 2022-01-20 VITALS — BP 115/95 | HR 80 | Temp 97.8°F | Resp 16 | Ht 66.0 in | Wt 192.4 lb

## 2022-01-20 DIAGNOSIS — K7460 Unspecified cirrhosis of liver: Secondary | ICD-10-CM

## 2022-01-20 DIAGNOSIS — F320 Major depressive disorder, single episode, mild: Secondary | ICD-10-CM

## 2022-01-20 DIAGNOSIS — Z794 Long term (current) use of insulin: Secondary | ICD-10-CM

## 2022-01-20 DIAGNOSIS — E1165 Type 2 diabetes mellitus with hyperglycemia: Secondary | ICD-10-CM | POA: Diagnosis not present

## 2022-01-20 DIAGNOSIS — R300 Dysuria: Secondary | ICD-10-CM

## 2022-01-20 DIAGNOSIS — Z0001 Encounter for general adult medical examination with abnormal findings: Secondary | ICD-10-CM

## 2022-01-20 DIAGNOSIS — J9611 Chronic respiratory failure with hypoxia: Secondary | ICD-10-CM

## 2022-01-20 DIAGNOSIS — E118 Type 2 diabetes mellitus with unspecified complications: Secondary | ICD-10-CM | POA: Diagnosis not present

## 2022-01-20 DIAGNOSIS — K7681 Hepatopulmonary syndrome: Secondary | ICD-10-CM

## 2022-01-20 DIAGNOSIS — K769 Liver disease, unspecified: Secondary | ICD-10-CM

## 2022-01-20 LAB — POCT GLYCOSYLATED HEMOGLOBIN (HGB A1C): Hemoglobin A1C: 6.4 % — AB (ref 4.0–5.6)

## 2022-01-20 MED ORDER — DULOXETINE HCL 20 MG PO CPEP: 20.0000 mg | ORAL_CAPSULE | Freq: Every day | ORAL | 3 refills | Status: DC

## 2022-01-20 NOTE — Progress Notes (Signed)
[facility] [place], [place] [address]  Internal MEDICINE  Office Visit Note  Patient Name: Danielle Wallace  [id number]  [id number]  Date of Service: 01/21/2022  Chief Complaint  Patient presents with   Annual Exam   Diabetes   Quality Metric Gaps    Foot Exam and Shingles Vaccine     HPI Pt is here for routine health maintenance She continues to feel better on oxygen with ambulation, still desaturates without it, she is using her inhalers Bresztri with good success. 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, still getting evaluation done She is also on CPAP machine and has done well Diabetes is under better control as well Patient is able to lose weight with diet and now is willing to try some rehab Her diabetic related preventive health maintenance is up-to-date including foot and eye examinations Has been having multiple symptoms of fatigue, aches and pains, takes tramadol, had ruq pain episode ( resolved for now) depressed  Breast cancer, followed by Surgery    Current Medication: Outpatient Encounter Medications as of 01/20/2022  Medication Sig   albuterol (VENTOLIN HFA) 108 (90 Base) MCG/ACT inhaler TAKE 2 PUFFS BY MOUTH EVERY 6 HOURS AS NEEDED FOR WHEEZE OR SHORTNESS OF BREATH   aspirin EC 81 MG tablet Take 1 tablet (81 mg total) by mouth daily. Swallow whole.   B COMPLEX-C PO Take by mouth daily.   Budeson-Glycopyrrol-Formoterol (BREZTRI AEROSPHERE) 160-9-4.8 MCG/ACT AERO Inhale 2 puffs into the lungs 2 (two) times daily. Pt needs 3 month supply   busPIRone (BUSPAR) 10 MG tablet Take 5 mg by mouth 2 (two) times daily.   Calcium Carb-Cholecalciferol (CALCIUM + D3) 600-200 MG-UNIT TABS Take 1 tablet by mouth 2 (two) times daily.   diphenhydrAMINE (BENADRYL) 25 MG tablet Take 25 mg by mouth every 6 (six) hours as needed.   doxycycline (VIBRA-TABS) 100 MG tablet Take 1 tablet (100 mg total) by mouth 2 (two)  times daily.   DULoxetine (CYMBALTA) 20 MG capsule Take 1 capsule (20 mg total) by mouth daily.   EPINEPHrine (EPIPEN 2-PAK) 0.3 mg/0.3 mL IJ SOAJ injection Inject 0.3 mLs (0.3 mg total) into the skin as needed.   fluticasone (FLONASE) 50 MCG/ACT nasal spray Place 1 spray into both nostrils daily.   furosemide (LASIX) 20 MG tablet Take 20 mg by mouth daily.   glucose blood (ONETOUCH VERIO) test strip Blood sugar testing done QD and as needed  E11.65   imipramine (TOFRANIL) 25 MG tablet TAKE 2 TABLETS AT BEDTIME   Insulin Pen Needle (BD PEN NEEDLE NANO 2ND GEN) 32G X 4 MM MISC Use as directed to inject Tresiba Daily into skin E11.65   lactulose (CHRONULAC) 10 GM/15ML solution Take 15 mLs (10 g total) by mouth 2 (two) times daily. For constipation   linaclotide (LINZESS) 290 MCG CAPS capsule Take 1 capsule (290 mcg total) by mouth daily before breakfast.   OZEMPIC, 1 MG/DOSE, 4 MG/3ML SOPN INJECT 1 MG UNDER THE SKIN ONCE A WEEK   pantoprazole (PROTONIX) 40 MG tablet Take 1 tablet (40 mg total) by mouth daily.   pravastatin (PRAVACHOL) 20 MG tablet Take 1 tablet (20 mg total) by mouth daily.   propranolol (INDERAL) 10 MG tablet Take 1 tablet (10 mg total) by mouth 3 (three) times daily. If bp is greater than 140   propranolol ER (INDERAL LA) 60 MG 24 hr capsule Take 1 capsule (60 mg total) by  mouth daily.   spironolactone (ALDACTONE) 25 MG tablet TAKE 1 TABLET BY MOUTH TWICE A DAY   tamoxifen (NOLVADEX) 10 MG tablet TAKE 1 TABLET DAILY   traMADol (ULTRAM) 50 MG tablet TAKE 1 TABLET BY MOUTH EVERY DAY AS NEEDED ONLY FOR SEVERE PAIN   TRESIBA FLEXTOUCH 100 UNIT/ML FlexTouch Pen INJECT 40 UNITS UNDER THE SKIN DAILY IN THE EVENING. RAISE DOSAGE BY 2 UNITS EVERY 3 DAYS UNTIL BLOOD SUGAR IS 130 OR BELOW   No facility-administered encounter medications on file as of 01/20/2022.    Surgical History: Past Surgical History:  Procedure Laterality Date   BREAST BIOPSY Right 2005   negative   BREAST  BIOPSY Left 08/23/2007   negative   BREAST BIOPSY Left 10/24/2013   positive   BREAST BIOPSY Left 07/2002   neg   BREAST BIOPSY Left 08/23/2007   neg   BREAST SURGERY Left 10/2011   Cyst Aspirationapocrine metaplasia, ductal cells and bone cells, hypo-cellular   BREAST SURGERY Left 2009   ADH on stereotactic biopsy, 1.5 mm focus.   BREAST SURGERY Left 2003   fibrocystic changes with ductal hyperplasia without atypia.   BREAST SURGERY Left    mastectomy   BREAST SURGERY Left April 11, 2014   Removal of implant, debridement chest wall [place]   COLONOSCOPY  08/26/2013   Verdie Shire, M.D. normal.   CRYOABLATION  2005, 2010   CYST REMOVAL NECK  10/2011   Dr. Tami Ribas   ESOPHAGOGASTRODUODENOSCOPY (EGD) WITH PROPOFOL N/A 01/16/2017   Procedure: ESOPHAGOGASTRODUODENOSCOPY (EGD) WITH PROPOFOL;  Surgeon: Lucilla Lame, MD;  Location: [facility] [facility];  Service: Gastroenterology;  Laterality: N/A;  Diabetic - oral meds   INCISION AND DRAINAGE Left Dec 2015, Feb 2016   Dr Tula Nakayama   MASTECTOMY Left 11/24/2013   positive   PORTACATH PLACEMENT  11/24/13    Medical History: Past Medical History:  Diagnosis Date   Arthritis 2006   Asthma    Breast cancer ([place]) 10/14/2013   18 mm, T1c, N0; ER/ PR positive,her 2 neu overexpressed. Adjuvant chemo/ herceptin.   Cancer [facility]) 2006   Renal cell carcinoma; cryosurgery treatment, right side   Cirrhosis ([place])    Collapsed lung 2007   Diabetes mellitus without complication ([place]) 7416   Metformin   Diffuse cystic mastopathy    Family history of breast cancer    Family history of liver cancer    Motion sickness    any moving vehicle   Orthodontics    braces   Personal history of chemotherapy    Sinus problem     Family History: Family History  Problem Relation Age of Onset   Liver cancer Father    Hemochromatosis Father    Liver disease Father    Diabetes Mother    Hypertension Mother    Breast  cancer Paternal Aunt 46   Ovarian cancer Maternal Grandmother        unk primary, metastatic cancer   Hemachromatosis Daughter    Liver cancer Paternal Uncle    Hemochromatosis Paternal Uncle     Social History: Social History   Socioeconomic History   Marital status: Married    Spouse name: Not on file   Number of children: Not on file   Years of education: Not on file   Highest education level: Not on file  Occupational History   Not on file  Tobacco Use   Smoking status: Never  Smokeless tobacco: Never  Vaping Use   Vaping Use: Never used  Substance and Sexual Activity   Alcohol use: Not Currently    Comment: occasionally, none recently   Drug use: No   Sexual activity: Not on file  Other Topics Concern   Not on file  Social History Narrative   Not on file   Social Determinants of Health   Financial Resource Strain: Not on file  Food Insecurity: Not on file  Transportation Needs: Not on file  Physical Activity: Not on file  Stress: Not on file  Social Connections: Not on file      Review of Systems  Constitutional:  Negative for activity change, appetite change, chills, diaphoresis, fatigue, fever and unexpected weight change.  HENT:  Negative for congestion, ear discharge, ear pain, facial swelling, hearing loss, nosebleeds, postnasal drip, rhinorrhea, sinus pressure, sinus pain, sneezing, sore throat, tinnitus, trouble swallowing and voice change.   Eyes:  Negative for photophobia, pain, discharge, redness, itching and visual disturbance.  Respiratory:  Positive for shortness of breath. Negative for apnea, cough, choking, chest tightness, wheezing and stridor.   Cardiovascular:  Negative for chest pain, palpitations and leg swelling.  Gastrointestinal:  Negative for abdominal distention, abdominal pain, anal bleeding, constipation, diarrhea, nausea, rectal pain and vomiting.  Endocrine: Negative for cold intolerance, heat intolerance, polydipsia, polyphagia  and polyuria.  Genitourinary:  Negative for difficulty urinating, dysuria, flank pain, frequency, genital sores, hematuria, menstrual problem, pelvic pain, urgency, vaginal bleeding, vaginal discharge and vaginal pain.  Musculoskeletal:  Negative for arthralgias, back pain, gait problem, joint swelling, myalgias and neck pain.  Skin:  Negative for color change, pallor, rash and wound.  Allergic/Immunologic: Negative for environmental allergies, food allergies and immunocompromised state.  Neurological: Negative.  Negative for dizziness, tremors, seizures, syncope, facial asymmetry, speech difficulty, weakness, light-headedness, numbness and headaches.  Hematological:  Negative for adenopathy. Does not bruise/bleed easily.  Psychiatric/Behavioral:  Negative for agitation, behavioral problems (Depression), confusion, decreased concentration, dysphoric mood, hallucinations, self-injury, sleep disturbance and suicidal ideas. The patient is not nervous/anxious and is not hyperactive.      Vital Signs: BP (!) 115/95   Pulse 80   Temp 97.8 F (36.6 C)   Resp 16   Ht 5' 6"  (1.676 m)   Wt 192 lb 6.4 oz (87.3 kg)   SpO2 98%   BMI 31.05 kg/m    Physical Exam Constitutional:      General: She is not in acute distress.    Appearance: Normal appearance. She is well-developed. She is not diaphoretic.  HENT:     Head: Normocephalic and atraumatic.     Right Ear: External ear normal.     Left Ear: External ear normal.     Nose: Nose normal.     Mouth/Throat:     Mouth: Mucous membranes are moist.     Pharynx: No oropharyngeal exudate or posterior oropharyngeal erythema.  Eyes:     General: No scleral icterus.       Right eye: No discharge.        Left eye: No discharge.     Extraocular Movements: Extraocular movements intact.     Conjunctiva/sclera: Conjunctivae normal.     Pupils: Pupils are equal, round, and reactive to light.  Neck:     Thyroid: No thyromegaly.     Vascular: No JVD.      Trachea: No tracheal deviation.  Cardiovascular:     Rate and Rhythm: Normal rate and regular rhythm.  Pulses: Normal pulses.     Heart sounds: Normal heart sounds. No murmur heard.    No friction rub. No gallop.  Pulmonary:     Effort: Pulmonary effort is normal. No respiratory distress.     Breath sounds: Normal breath sounds. No stridor. No wheezing or rales.  Chest:     Chest wall: No tenderness.  Abdominal:     General: Bowel sounds are normal. There is no distension.     Palpations: Abdomen is soft. There is no mass.     Tenderness: There is no abdominal tenderness. There is no guarding or rebound.  Musculoskeletal:        General: No tenderness or deformity. Normal range of motion.     Cervical back: Normal range of motion and neck supple.     Right foot: Normal range of motion.     Left foot: Normal range of motion.  Feet:     Right foot:     Protective Sensation: 2 sites tested.  2 sites sensed.     Skin integrity: Skin integrity normal.     Toenail Condition: Right toenails are normal.     Left foot:     Protective Sensation: 2 sites tested.  2 sites sensed.     Skin integrity: Skin integrity normal.     Toenail Condition: Left toenails are normal.  Lymphadenopathy:     Cervical: No cervical adenopathy.  Skin:    General: Skin is warm and dry.     Coloration: Skin is not pale.     Findings: No erythema or rash.  Neurological:     General: No focal deficit present.     Mental Status: She is alert.     Cranial Nerves: No cranial nerve deficit.     Motor: No abnormal muscle tone.     Coordination: Coordination normal.     Deep Tendon Reflexes: Reflexes are normal and symmetric.  Psychiatric:        Mood and Affect: Mood normal.        Behavior: Behavior normal.        Thought Content: Thought content normal.        Judgment: Judgment normal.      LABS: Recent Results (from the past 2160 hour(s))  CMP14+EGFR     Status: Abnormal   Collection Time:  10/30/21  7:15 AM  Result Value Ref Range   Glucose 144 (H) 70 - 99 mg/dL   BUN 17 8 - 27 mg/dL   Creatinine, Ser 0.71 0.57 - 1.00 mg/dL   eGFR 96 >59 mL/min/1.73   BUN/Creatinine Ratio 24 12 - 28   Sodium 138 134 - 144 mmol/L   Potassium 4.1 3.5 - 5.2 mmol/L   Chloride 99 96 - 106 mmol/L   CO2 25 20 - 29 mmol/L   Calcium 9.0 8.7 - 10.3 mg/dL   Total Protein 6.7 6.0 - 8.5 g/dL   Albumin 4.0 3.8 - 4.8 g/dL   Globulin, Total 2.7 1.5 - 4.5 g/dL   Albumin/Globulin Ratio 1.5 1.2 - 2.2   Bilirubin Total 0.6 0.0 - 1.2 mg/dL   Alkaline Phosphatase 103 44 - 121 IU/L   AST 72 (H) 0 - 40 IU/L   ALT 60 (H) 0 - 32 IU/L  POCT HgB A1C     Status: Abnormal   Collection Time: 10/31/21 11:03 AM  Result Value Ref Range   Hemoglobin A1C 6.5 (A) 4.0 - 5.6 %   HbA1c POC (<> result,  manual entry)     HbA1c, POC (prediabetic range)     HbA1c, POC (controlled diabetic range)    AFP tumor marker     Status: None   Collection Time: 11/14/21  2:25 PM  Result Value Ref Range   AFP, Serum, Tumor Marker 4.8 0.0 - 9.2 ng/mL    Comment: [employer] Electrochemiluminescence Immunoassay (ECLIA) Values obtained with different assay methods or kits cannot be used interchangeably.  Results cannot be interpreted as absolute evidence of the presence or absence of malignant disease. This test is not interpretable in pregnant females.   Microalbumin, urine     Status: None   Collection Time: 11/19/21 11:22 AM  Result Value Ref Range   Microalbumin, Urine <3.0 Not Estab. ug/mL  Lipase, blood     Status: None   Collection Time: 12/23/21  3:53 PM  Result Value Ref Range   Lipase 37 11 - 51 U/L    Comment: Performed at [facility], [address] [place], [place] [address]  Comprehensive metabolic panel     Status: Abnormal   Collection Time: 12/23/21  3:53 PM  Result Value Ref Range   Sodium 137 135 - 145 mmol/L   Potassium 4.0 3.5 - 5.1 mmol/L   Chloride 101 98 - 111 mmol/L   CO2 25 22 - 32  mmol/L   Glucose, Bld 104 (H) 70 - 99 mg/dL    Comment: Glucose reference range applies only to samples taken after fasting for at least 8 hours.   BUN 14 8 - 23 mg/dL   Creatinine, Ser 0.65 0.44 - 1.00 mg/dL   Calcium 8.9 8.9 - 10.3 mg/dL   Total Protein 7.3 6.5 - 8.1 g/dL   Albumin 3.2 (L) 3.5 - 5.0 g/dL   AST 42 (H) 15 - 41 U/L   ALT 30 0 - 44 U/L   Alkaline Phosphatase 81 38 - 126 U/L   Total Bilirubin 0.6 0.3 - 1.2 mg/dL   GFR, Estimated >60 >60 mL/min    Comment: (NOTE) Calculated using the CKD-EPI Creatinine Equation (2021)    Anion gap 11 5 - 15    Comment: Performed at [facility], [address] [place], [place] [address]  CBC     Status: Abnormal   Collection Time: 12/23/21  3:53 PM  Result Value Ref Range   WBC 13.6 (H) 4.0 - 10.5 K/uL   RBC 4.40 3.87 - 5.11 MIL/uL   Hemoglobin 13.2 12.0 - 15.0 g/dL   HCT 39.8 36.0 - 46.0 %   MCV 90.5 80.0 - 100.0 fL   MCH 30.0 26.0 - 34.0 pg   MCHC 33.2 30.0 - 36.0 g/dL   RDW 11.7 11.5 - 15.5 %   Platelets 380 150 - 400 K/uL   nRBC 0.0 0.0 - 0.2 %    Comment: Performed at [facility], [place], [place] [address]  Troponin I (High Sensitivity)     Status: None   Collection Time: 12/23/21  3:53 PM  Result Value Ref Range   Troponin I (High Sensitivity) 3 <18 ng/L    Comment: (NOTE) Elevated high sensitivity troponin I (hsTnI) values and significant  changes across serial measurements may suggest ACS but many other  chronic and acute conditions are known to elevate hsTnI results.  Refer to the "Links" section for chest pain algorithms and additional  guidance. Performed at [facility], [address], [place], [place] [address]   Urinalysis, Routine  w reflex microscopic Urine, Clean Catch     Status: Abnormal   Collection Time: 12/23/21 10:10 PM  Result Value Ref Range   Color, Urine YELLOW (A) YELLOW   APPearance HAZY (A) CLEAR   Specific Gravity, Urine 1.019 1.005 - 1.030    pH 7.0 5.0 - 8.0   Glucose, UA NEGATIVE NEGATIVE mg/dL   Hgb urine dipstick NEGATIVE NEGATIVE   Bilirubin Urine NEGATIVE NEGATIVE   Ketones, ur 5 (A) NEGATIVE mg/dL   Protein, ur NEGATIVE NEGATIVE mg/dL   Nitrite NEGATIVE NEGATIVE   Leukocytes,Ua NEGATIVE NEGATIVE    Comment: Performed at [facility], [address] [place], [place] [address]  Comprehensive metabolic panel     Status: Abnormal   Collection Time: 01/06/22 11:11 AM  Result Value Ref Range   Glucose 136 (H) 70 - 99 mg/dL   BUN 10 8 - 27 mg/dL   Creatinine, Ser 0.70 0.57 - 1.00 mg/dL   eGFR 98 >59 mL/min/1.73   BUN/Creatinine Ratio 14 12 - 28   Sodium 138 134 - 144 mmol/L   Potassium 4.7 3.5 - 5.2 mmol/L   Chloride 97 96 - 106 mmol/L   CO2 25 20 - 29 mmol/L   Calcium 9.2 8.7 - 10.3 mg/dL   Total Protein 7.1 6.0 - 8.5 g/dL   Albumin 3.8 (L) 3.9 - 4.9 g/dL   Globulin, Total 3.3 1.5 - 4.5 g/dL   Albumin/Globulin Ratio 1.2 1.2 - 2.2   Bilirubin Total 0.4 0.0 - 1.2 mg/dL   Alkaline Phosphatase 98 44 - 121 IU/L   AST 68 (H) 0 - 40 IU/L   ALT 38 (H) 0 - 32 IU/L  POCT HgB A1C     Status: Abnormal   Collection Time: 01/20/22 10:59 AM  Result Value Ref Range   Hemoglobin A1C 6.4 (A) 4.0 - 5.6 %   HbA1c POC (<> result, manual entry)     HbA1c, POC (prediabetic range)     HbA1c, POC (controlled diabetic range)    UA/M w/rflx Culture, Routine     Status: None   Collection Time: 01/20/22 11:36 AM   Specimen: Urine   Urine  Result Value Ref Range   Specific Gravity, UA 1.015 1.005 - 1.030   pH, UA 5.5 5.0 - 7.5   Color, UA Yellow Yellow   Appearance Ur Clear Clear   Leukocytes,UA Negative Negative   Protein,UA Negative Negative/Trace   Glucose, UA Negative Negative   Ketones, UA Negative Negative   RBC, UA Negative Negative   Bilirubin, UA Negative Negative   Urobilinogen, Ur 0.2 0.2 - 1.0 mg/dL   Nitrite, UA Negative Negative   Microscopic Examination Comment     Comment: Microscopic follows if  indicated.   Microscopic Examination See below:     Comment: Microscopic was indicated and was performed.   Urinalysis Reflex Comment     Comment: This specimen will not reflex to a Urine Culture.  Microscopic Examination     Status: Abnormal   Collection Time: 01/20/22 11:36 AM   Urine  Result Value Ref Range   WBC, UA None seen 0 - 5 /hpf   RBC, Urine 0-2 0 - 2 /hpf   Epithelial Cells (non renal) None seen 0 - 10 /hpf   Casts None seen None seen /lpf   Crystals Present (A) N/A   Crystal Type Calcium Oxalate N/A   Bacteria, UA None seen None seen/Few     Assessment/Plan: 1. Encounter for general adult  medical examination with abnormal findings All PHM is up to date  2. Chronic respiratory failure with hypoxia (HCC) Pt is on O2,, will need repeat CT chest, continue Breztri as before   3. Type 2 diabetes mellitus with hyperglycemia, with long-term current use of insulin ([place]) Continue current care. Reduce insulin dose by 2 units every day if her fasting glucose is below 100 - POCT HgB A1C  4. Chronic liver disease and cirrhosis (HCC) This study was also done however has been nonconclusive and will need repeat by cardiology Exam Type:     ECHOCARDIOGRAM W COLORFLOW SPECTRAL DOPPLER W CONTRAST  Study Info  Indications       - cirrhosis,  hypoxia,  evaluate for hepatopulmonary syndrome shunt ;  Bubble Study  Complete two-dimensional, color flow and Doppler transthoracic echocardiogram  is performed with agitated saline.  Study is not conclusive  5. 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 No extrapulmonary accumulation of radiotracer is visualized, specifically  no uptake is seen in the brain or kidneys.  7. Dysuria Microabumin, and U/S sent    8. Depression, major, single episode, mild (HCC) Low dose cymbalta id given for Phq screening reviewed    01/21/2022    9:31 AM 01/21/2022    9:30 AM 01/20/2022   10:09 AM 10/01/2021    9:51 AM 07/26/2021    9:25 AM  Depression screen PHQ 2/9  Decreased Interest 2  0 0 0  Down, Depressed, Hopeless 2 2 1  0 1  PHQ - 2 Score 4 2 1  0 1  Altered sleeping 1      Tired, decreased energy 1      Change in appetite 1      Feeling bad or failure about yourself  1      Trouble concentrating 1      Moving slowly or fidgety/restless 1      Suicidal thoughts 0      PHQ-9 Score 10      Difficult doing work/chores Somewhat difficult         - DULoxetine (CYMBALTA) 20 MG capsule; Take 1 capsule (20 mg total) by mouth daily.  Dispense: 30 capsule; Refill: 3  General Counseling: Shuna verbalizes understanding of the findings of todays visit and agrees with plan of treatment. I have discussed any further diagnostic evaluation that may be needed or ordered today. We also reviewed her medications today. she has been encouraged to call the office with any questions or concerns that should arise related to todays visit.    Counseling:  [place] Controlled Substance Database was reviewed by me.  Orders Placed This Encounter  Procedures   Microscopic Examination   UA/M w/rflx Culture, Routine   POCT HgB A1C    Meds ordered this encounter  Medications   DULoxetine (CYMBALTA) 20 MG capsule    Sig: Take 1 capsule (20 mg total) by mouth daily.    Dispense:  30 capsule    Refill:  3    Total time spent:45 Minutes  Time spent includes review of chart, medications, test results, and follow up plan with the patient.     Lavera Guise, MD  Internal Medicine

## 2022-01-21 ENCOUNTER — Encounter: Payer: Self-pay | Admitting: Internal Medicine

## 2022-01-21 ENCOUNTER — Other Ambulatory Visit: Payer: Self-pay | Admitting: Internal Medicine

## 2022-01-21 ENCOUNTER — Other Ambulatory Visit: Payer: Self-pay

## 2022-01-21 LAB — UA/M W/RFLX CULTURE, ROUTINE
Bilirubin, UA: NEGATIVE
Glucose, UA: NEGATIVE
Ketones, UA: NEGATIVE
Leukocytes,UA: NEGATIVE
Nitrite, UA: NEGATIVE
Protein,UA: NEGATIVE
RBC, UA: NEGATIVE
Specific Gravity, UA: 1.015 (ref 1.005–1.030)
Urobilinogen, Ur: 0.2 mg/dL (ref 0.2–1.0)
pH, UA: 5.5 (ref 5.0–7.5)

## 2022-01-21 LAB — MICROSCOPIC EXAMINATION
Bacteria, UA: NONE SEEN
Casts: NONE SEEN /lpf
Epithelial Cells (non renal): NONE SEEN /hpf (ref 0–10)
WBC, UA: NONE SEEN /hpf (ref 0–5)

## 2022-01-21 MED ORDER — FLUTICASONE PROPIONATE 50 MCG/ACT NA SUSP: 1.0000 | Freq: Every day | NASAL | 3 refills | Status: DC

## 2022-01-22 ENCOUNTER — Other Ambulatory Visit: Payer: Self-pay

## 2022-01-22 DIAGNOSIS — Z6831 Body mass index (BMI) 31.0-31.9, adult: Secondary | ICD-10-CM | POA: Diagnosis not present

## 2022-01-22 DIAGNOSIS — K7460 Unspecified cirrhosis of liver: Secondary | ICD-10-CM

## 2022-01-22 DIAGNOSIS — R300 Dysuria: Secondary | ICD-10-CM

## 2022-01-22 DIAGNOSIS — E8801 Alpha-1-antitrypsin deficiency: Secondary | ICD-10-CM | POA: Diagnosis not present

## 2022-01-22 DIAGNOSIS — R791 Abnormal coagulation profile: Secondary | ICD-10-CM | POA: Diagnosis not present

## 2022-01-22 DIAGNOSIS — R110 Nausea: Secondary | ICD-10-CM | POA: Diagnosis not present

## 2022-01-22 DIAGNOSIS — Z148 Genetic carrier of other disease: Secondary | ICD-10-CM | POA: Diagnosis not present

## 2022-01-22 DIAGNOSIS — I10 Essential (primary) hypertension: Secondary | ICD-10-CM | POA: Diagnosis not present

## 2022-01-22 DIAGNOSIS — F320 Major depressive disorder, single episode, mild: Secondary | ICD-10-CM

## 2022-01-22 DIAGNOSIS — R0600 Dyspnea, unspecified: Secondary | ICD-10-CM | POA: Diagnosis not present

## 2022-01-22 DIAGNOSIS — Z8616 Personal history of COVID-19: Secondary | ICD-10-CM | POA: Diagnosis not present

## 2022-01-22 DIAGNOSIS — E119 Type 2 diabetes mellitus without complications: Secondary | ICD-10-CM | POA: Diagnosis not present

## 2022-01-22 DIAGNOSIS — E118 Type 2 diabetes mellitus with unspecified complications: Secondary | ICD-10-CM

## 2022-01-22 DIAGNOSIS — Z853 Personal history of malignant neoplasm of breast: Secondary | ICD-10-CM | POA: Diagnosis not present

## 2022-01-22 DIAGNOSIS — J9611 Chronic respiratory failure with hypoxia: Secondary | ICD-10-CM

## 2022-01-22 DIAGNOSIS — E669 Obesity, unspecified: Secondary | ICD-10-CM | POA: Diagnosis not present

## 2022-01-22 DIAGNOSIS — K7681 Hepatopulmonary syndrome: Secondary | ICD-10-CM

## 2022-01-22 DIAGNOSIS — R9389 Abnormal findings on diagnostic imaging of other specified body structures: Secondary | ICD-10-CM | POA: Diagnosis not present

## 2022-01-22 DIAGNOSIS — J45909 Unspecified asthma, uncomplicated: Secondary | ICD-10-CM | POA: Diagnosis not present

## 2022-01-22 DIAGNOSIS — Z0001 Encounter for general adult medical examination with abnormal findings: Secondary | ICD-10-CM

## 2022-01-22 DIAGNOSIS — E1165 Type 2 diabetes mellitus with hyperglycemia: Secondary | ICD-10-CM

## 2022-01-22 DIAGNOSIS — Z9012 Acquired absence of left breast and nipple: Secondary | ICD-10-CM | POA: Diagnosis not present

## 2022-01-22 MED ORDER — DULOXETINE HCL 20 MG PO CPEP: 20.0000 mg | ORAL_CAPSULE | Freq: Every day | ORAL | 3 refills | Status: DC

## 2022-01-22 NOTE — Telephone Encounter (Signed)
Spoke to pt and informed her that we spoke to the pharmacy about the contraindication with the duloxetine.  Informed pt that Dr Humphrey Rolls and Yetta Flock approved that she was ok taking the medication.  Also informed pt that [facility] advised that she can take up to 15 cc's of Lactulose if she feels she needs to.  Pt understood.

## 2022-01-23 ENCOUNTER — Ambulatory Visit: Payer: BC Managed Care – PPO | Admitting: Gastroenterology

## 2022-01-24 DIAGNOSIS — G4733 Obstructive sleep apnea (adult) (pediatric): Secondary | ICD-10-CM | POA: Diagnosis not present

## 2022-01-29 DIAGNOSIS — K7460 Unspecified cirrhosis of liver: Secondary | ICD-10-CM | POA: Diagnosis not present

## 2022-01-30 ENCOUNTER — Encounter: Payer: Self-pay | Admitting: Internal Medicine

## 2022-01-30 ENCOUNTER — Ambulatory Visit (INDEPENDENT_AMBULATORY_CARE_PROVIDER_SITE_OTHER): Payer: BC Managed Care – PPO | Admitting: Internal Medicine

## 2022-01-30 VITALS — BP 120/70 | HR 89 | Temp 97.8°F | Resp 16 | Ht 65.5 in | Wt 192.0 lb

## 2022-01-30 DIAGNOSIS — J9611 Chronic respiratory failure with hypoxia: Secondary | ICD-10-CM

## 2022-01-30 DIAGNOSIS — G4733 Obstructive sleep apnea (adult) (pediatric): Secondary | ICD-10-CM | POA: Diagnosis not present

## 2022-01-30 DIAGNOSIS — K769 Liver disease, unspecified: Secondary | ICD-10-CM

## 2022-01-30 DIAGNOSIS — K7460 Unspecified cirrhosis of liver: Secondary | ICD-10-CM | POA: Diagnosis not present

## 2022-01-30 NOTE — Progress Notes (Signed)
[facility] [place], [place] [address]  Pulmonary Sleep Medicine   Office Visit Note  Patient Name: Danielle Wallace DOB: 1961/02/09 MRN [id number]  Date of Service: 01/30/2022  Complaints/HPI: CPAP study follow up. Patient states she is feeling less. Tired. She feels fresh in the morning. Patient states that she has had some issues with her husband having restless sleep. Occasional headaches. Denies having chest pain. No SOB noted. Current CPAP is on autopap with a pressure setup of 4-16 cwp  ROS  General: (-) fever, (-) chills, (-) night sweats, (-) weakness Skin: (-) rashes, (-) itching,. Eyes: (-) visual changes, (-) redness, (-) itching. Nose and Sinuses: (-) nasal stuffiness or itchiness, (-) postnasal drip, (-) nosebleeds, (-) sinus trouble. Mouth and Throat: (-) sore throat, (-) hoarseness. Neck: (-) swollen glands, (-) enlarged thyroid, (-) neck pain. Respiratory: - cough, (-) bloody sputum, - shortness of breath, - wheezing. Cardiovascular: - ankle swelling, (-) chest pain. Lymphatic: (-) lymph node enlargement. Neurologic: (-) numbness, (-) tingling. Psychiatric: (-) anxiety, (-) depression   Current Medication: Outpatient Encounter Medications as of 01/30/2022  Medication Sig   albuterol (VENTOLIN HFA) 108 (90 Base) MCG/ACT inhaler TAKE 2 PUFFS BY MOUTH EVERY 6 HOURS AS NEEDED FOR WHEEZE OR SHORTNESS OF BREATH   aspirin EC 81 MG tablet Take 1 tablet (81 mg total) by mouth daily. Swallow whole.   B COMPLEX-C PO Take by mouth daily.   Budeson-Glycopyrrol-Formoterol (BREZTRI AEROSPHERE) 160-9-4.8 MCG/ACT AERO Inhale 2 puffs into the lungs 2 (two) times daily. Pt needs 3 month supply   busPIRone (BUSPAR) 10 MG tablet Take 5 mg by mouth 2 (two) times daily.   Calcium Carb-Cholecalciferol (CALCIUM + D3) 600-200 MG-UNIT TABS Take 1 tablet by mouth 2 (two) times daily.   diphenhydrAMINE (BENADRYL) 25 MG tablet Take 25 mg by mouth every 6 (six) hours  as needed.   doxycycline (VIBRA-TABS) 100 MG tablet Take 1 tablet (100 mg total) by mouth 2 (two) times daily.   DULoxetine (CYMBALTA) 20 MG capsule Take 1 capsule (20 mg total) by mouth daily.   EPINEPHrine (EPIPEN 2-PAK) 0.3 mg/0.3 mL IJ SOAJ injection Inject 0.3 mLs (0.3 mg total) into the skin as needed.   fluticasone (FLONASE) 50 MCG/ACT nasal spray Place 1 spray into both nostrils daily.   furosemide (LASIX) 20 MG tablet Take 20 mg by mouth daily.   glucose blood (ONETOUCH VERIO) test strip Blood sugar testing done QD and as needed  E11.65   imipramine (TOFRANIL) 25 MG tablet TAKE 2 TABLETS AT BEDTIME   Insulin Pen Needle (BD PEN NEEDLE NANO 2ND GEN) 32G X 4 MM MISC Use as directed to inject Tresiba Daily into skin E11.65   lactulose (CHRONULAC) 10 GM/15ML solution Take 15 mLs (10 g total) by mouth 2 (two) times daily. For constipation   linaclotide (LINZESS) 290 MCG CAPS capsule Take 1 capsule (290 mcg total) by mouth daily before breakfast.   OZEMPIC, 1 MG/DOSE, 4 MG/3ML SOPN INJECT 1 MG UNDER THE SKIN ONCE A WEEK   pantoprazole (PROTONIX) 40 MG tablet Take 1 tablet (40 mg total) by mouth daily.   pravastatin (PRAVACHOL) 20 MG tablet Take 1 tablet (20 mg total) by mouth daily.   propranolol (INDERAL) 10 MG tablet Take 1 tablet (10 mg total) by mouth 3 (three) times daily. If bp is greater than 140   propranolol ER (INDERAL LA) 60 MG 24 hr capsule Take 1 capsule (60 mg total) by mouth daily.  spironolactone (ALDACTONE) 25 MG tablet TAKE 1 TABLET BY MOUTH TWICE A DAY   tamoxifen (NOLVADEX) 10 MG tablet TAKE 1 TABLET DAILY   traMADol (ULTRAM) 50 MG tablet TAKE 1 TABLET BY MOUTH EVERY DAY AS NEEDED ONLY FOR SEVERE PAIN   TRESIBA FLEXTOUCH 100 UNIT/ML FlexTouch Pen INJECT 40 UNITS UNDER THE SKIN DAILY IN THE EVENING. RAISE DOSAGE BY 2 UNITS EVERY 3 DAYS UNTIL BLOOD SUGAR IS 130 OR BELOW   No facility-administered encounter medications on file as of 01/30/2022.    Surgical History: Past  Surgical History:  Procedure Laterality Date   BREAST BIOPSY Right 2005   negative   BREAST BIOPSY Left 08/23/2007   negative   BREAST BIOPSY Left 10/24/2013   positive   BREAST BIOPSY Left 07/2002   neg   BREAST BIOPSY Left 08/23/2007   neg   BREAST SURGERY Left 10/2011   Cyst Aspirationapocrine metaplasia, ductal cells and bone cells, hypo-cellular   BREAST SURGERY Left 2009   ADH on stereotactic biopsy, 1.5 mm focus.   BREAST SURGERY Left 2003   fibrocystic changes with ductal hyperplasia without atypia.   BREAST SURGERY Left    mastectomy   BREAST SURGERY Left April 11, 2014   Removal of implant, debridement chest wall [place]   COLONOSCOPY  08/26/2013   Verdie Shire, M.D. normal.   CRYOABLATION  2005, 2010   CYST REMOVAL NECK  10/2011   Dr. Tami Ribas   ESOPHAGOGASTRODUODENOSCOPY (EGD) WITH PROPOFOL N/A 01/16/2017   Procedure: ESOPHAGOGASTRODUODENOSCOPY (EGD) WITH PROPOFOL;  Surgeon: Lucilla Lame, MD;  Location: [place];  Service: Gastroenterology;  Laterality: N/A;  Diabetic - oral meds   INCISION AND DRAINAGE Left Dec 2015, Feb 2016   Dr Tula Nakayama   MASTECTOMY Left 11/24/2013   positive   PORTACATH PLACEMENT  11/24/13    Medical History: Past Medical History:  Diagnosis Date   Arthritis 2006   Asthma    Breast cancer ([place]) 10/14/2013   18 mm, T1c, N0; ER/ PR positive,her 2 neu overexpressed. Adjuvant chemo/ herceptin.   Cancer [facility]) 2006   Renal cell carcinoma; cryosurgery treatment, right side   Cirrhosis ([place])    Collapsed lung 2007   Diabetes mellitus without complication ([place]) 4854   Metformin   Diffuse cystic mastopathy    Family history of breast cancer    Family history of liver cancer    Motion sickness    any moving vehicle   Orthodontics    braces   Personal history of chemotherapy    Sinus problem     Family History: Family History  Problem Relation Age of Onset   Liver cancer Father     Hemochromatosis Father    Liver disease Father    Diabetes Mother    Hypertension Mother    Breast cancer Paternal Aunt 31   Ovarian cancer Maternal Grandmother        unk primary, metastatic cancer   Hemachromatosis Daughter    Liver cancer Paternal Uncle    Hemochromatosis Paternal Uncle     Social History: Social History   Socioeconomic History   Marital status: Married    Spouse name: Not on file   Number of children: Not on file   Years of education: Not on file   Highest education level: Not on file  Occupational History   Not on file  Tobacco Use   Smoking status: Never   Smokeless tobacco: Never  Vaping Use   Vaping Use: Never used  Substance and Sexual Activity   Alcohol use: Not Currently    Comment: occasionally, none recently   Drug use: No   Sexual activity: Not on file  Other Topics Concern   Not on file  Social History Narrative   Not on file   Social Determinants of Health   Financial Resource Strain: Not on file  Food Insecurity: Not on file  Transportation Needs: Not on file  Physical Activity: Not on file  Stress: Not on file  Social Connections: Not on file  Intimate Partner Violence: Not on file    Vital Signs: Blood pressure 120/70, pulse 89, temperature 97.8 F (36.6 C), resp. rate 16, height 5' 5.5" (1.664 m), weight 192 lb (87.1 kg), SpO2 97 %.  Examination: General Appearance: The patient is well-developed, well-nourished, and in no distress. Skin: Gross inspection of skin unremarkable. Head: normocephalic, no gross deformities. Eyes: no gross deformities noted. ENT: ears appear grossly normal no exudates. Neck: Supple. No thyromegaly. No LAD. Respiratory: no rhonchi noted. Cardiovascular: Normal S1 and S2 without murmur or rub. Extremities: No cyanosis. pulses are equal. Neurologic: Alert and oriented. No involuntary movements.  LABS: Recent Results (from the past 2160 hour(s))  AFP tumor marker     Status: None    Collection Time: 11/14/21  2:25 PM  Result Value Ref Range   AFP, Serum, Tumor Marker 4.8 0.0 - 9.2 ng/mL    Comment: Roche [employer] Electrochemiluminescence Immunoassay (ECLIA) Values obtained with different assay methods or kits cannot be used interchangeably.  Results cannot be interpreted as absolute evidence of the presence or absence of malignant disease. This test is not interpretable in pregnant females.   Microalbumin, urine     Status: None   Collection Time: 11/19/21 11:22 AM  Result Value Ref Range   Microalbumin, Urine <3.0 Not Estab. ug/mL  Lipase, blood     Status: None   Collection Time: 12/23/21  3:53 PM  Result Value Ref Range   Lipase 37 11 - 51 U/L    Comment: Performed at [facility], [address] [place], [place] [address]  Comprehensive metabolic panel     Status: Abnormal   Collection Time: 12/23/21  3:53 PM  Result Value Ref Range   Sodium 137 135 - 145 mmol/L   Potassium 4.0 3.5 - 5.1 mmol/L   Chloride 101 98 - 111 mmol/L   CO2 25 22 - 32 mmol/L   Glucose, Bld 104 (H) 70 - 99 mg/dL    Comment: Glucose reference range applies only to samples taken after fasting for at least 8 hours.   BUN 14 8 - 23 mg/dL   Creatinine, Ser 0.65 0.44 - 1.00 mg/dL   Calcium 8.9 8.9 - 10.3 mg/dL   Total Protein 7.3 6.5 - 8.1 g/dL   Albumin 3.2 (L) 3.5 - 5.0 g/dL   AST 42 (H) 15 - 41 U/L   ALT 30 0 - 44 U/L   Alkaline Phosphatase 81 38 - 126 U/L   Total Bilirubin 0.6 0.3 - 1.2 mg/dL   GFR, Estimated >60 >60 mL/min    Comment: (NOTE) Calculated using the CKD-EPI Creatinine Equation (2021)    Anion gap 11 5 - 15    Comment: Performed at [facility], [address], [place], [place] [address]  CBC     Status: Abnormal   Collection Time: 12/23/21  3:53 PM  Result Value Ref Range   WBC  13.6 (H) 4.0 - 10.5 K/uL   RBC 4.40 3.87 - 5.11 MIL/uL   Hemoglobin 13.2 12.0 - 15.0 g/dL   HCT 39.8 36.0 - 46.0 %   MCV 90.5 80.0 - 100.0 fL   MCH 30.0 26.0  - 34.0 pg   MCHC 33.2 30.0 - 36.0 g/dL   RDW 11.7 11.5 - 15.5 %   Platelets 380 150 - 400 K/uL   nRBC 0.0 0.0 - 0.2 %    Comment: Performed at [facility], [place], [place] [address]  Troponin I (High Sensitivity)     Status: None   Collection Time: 12/23/21  3:53 PM  Result Value Ref Range   Troponin I (High Sensitivity) 3 <18 ng/L    Comment: (NOTE) Elevated high sensitivity troponin I (hsTnI) values and significant  changes across serial measurements may suggest ACS but many other  chronic and acute conditions are known to elevate hsTnI results.  Refer to the "Links" section for chest pain algorithms and additional  guidance. Performed at [facility], [address] [place], [place] [address]   Urinalysis, Routine w reflex microscopic Urine, Clean Catch     Status: Abnormal   Collection Time: 12/23/21 10:10 PM  Result Value Ref Range   Color, Urine YELLOW (A) YELLOW   APPearance HAZY (A) CLEAR   Specific Gravity, Urine 1.019 1.005 - 1.030   pH 7.0 5.0 - 8.0   Glucose, UA NEGATIVE NEGATIVE mg/dL   Hgb urine dipstick NEGATIVE NEGATIVE   Bilirubin Urine NEGATIVE NEGATIVE   Ketones, ur 5 (A) NEGATIVE mg/dL   Protein, ur NEGATIVE NEGATIVE mg/dL   Nitrite NEGATIVE NEGATIVE   Leukocytes,Ua NEGATIVE NEGATIVE    Comment: Performed at [facility], [address] [place], [place] [address]  Comprehensive metabolic panel     Status: Abnormal   Collection Time: 01/06/22 11:11 AM  Result Value Ref Range   Glucose 136 (H) 70 - 99 mg/dL   BUN 10 8 - 27 mg/dL   Creatinine, Ser 0.70 0.57 - 1.00 mg/dL   eGFR 98 >59 mL/min/1.73   BUN/Creatinine Ratio 14 12 - 28   Sodium 138 134 - 144 mmol/L   Potassium 4.7 3.5 - 5.2 mmol/L   Chloride 97 96 - 106 mmol/L   CO2 25 20 - 29 mmol/L   Calcium 9.2 8.7 - 10.3 mg/dL   Total Protein 7.1 6.0 - 8.5 g/dL   Albumin 3.8 (L) 3.9 - 4.9 g/dL   Globulin, Total 3.3 1.5 - 4.5 g/dL   Albumin/Globulin Ratio  1.2 1.2 - 2.2   Bilirubin Total 0.4 0.0 - 1.2 mg/dL   Alkaline Phosphatase 98 44 - 121 IU/L   AST 68 (H) 0 - 40 IU/L   ALT 38 (H) 0 - 32 IU/L  POCT HgB A1C     Status: Abnormal   Collection Time: 01/20/22 10:59 AM  Result Value Ref Range   Hemoglobin A1C 6.4 (A) 4.0 - 5.6 %   HbA1c POC (<> result, manual entry)     HbA1c, POC (prediabetic range)     HbA1c, POC (controlled diabetic range)    UA/M w/rflx Culture, Routine     Status: None   Collection Time: 01/20/22 11:36 AM   Specimen: Urine   Urine  Result Value Ref Range   Specific Gravity, UA 1.015 1.005 - 1.030   pH, UA 5.5 5.0 - 7.5   Color, UA Yellow Yellow   Appearance Ur Clear Clear  Leukocytes,UA Negative Negative   Protein,UA Negative Negative/Trace   Glucose, UA Negative Negative   Ketones, UA Negative Negative   RBC, UA Negative Negative   Bilirubin, UA Negative Negative   Urobilinogen, Ur 0.2 0.2 - 1.0 mg/dL   Nitrite, UA Negative Negative   Microscopic Examination Comment     Comment: Microscopic follows if indicated.   Microscopic Examination See below:     Comment: Microscopic was indicated and was performed.   Urinalysis Reflex Comment     Comment: This specimen will not reflex to a Urine Culture.  Microscopic Examination     Status: Abnormal   Collection Time: 01/20/22 11:36 AM   Urine  Result Value Ref Range   WBC, UA None seen 0 - 5 /hpf   RBC, Urine 0-2 0 - 2 /hpf   Epithelial Cells (non renal) None seen 0 - 10 /hpf   Casts None seen None seen /lpf   Crystals Present (A) N/A   Crystal Type Calcium Oxalate N/A   Bacteria, UA None seen None seen/Few    Radiology: CT Angio Chest PE W and/or Wo Contrast  Result Date: 12/23/2021 CLINICAL DATA:  Acute abdominal pain on the right side with chest pain, initial encounter EXAM: CT ANGIOGRAPHY CHEST CT ABDOMEN AND PELVIS WITH CONTRAST TECHNIQUE: Multidetector CT imaging of the chest was performed using the standard protocol during bolus administration of  intravenous contrast. Multiplanar CT image reconstructions and MIPs were obtained to evaluate the vascular anatomy. Multidetector CT imaging of the abdomen and pelvis was performed using the standard protocol during bolus administration of intravenous contrast. RADIATION DOSE REDUCTION: This exam was performed according to the departmental dose-optimization program which includes automated exposure control, adjustment of the mA and/or kV according to patient size and/or use of iterative reconstruction technique. CONTRAST:  173m OMNIPAQUE IOHEXOL 350 MG/ML SOLN COMPARISON:  06/17/2025, 11/22/2021 FINDINGS: CTA CHEST FINDINGS Cardiovascular: Thoracic aorta shows a normal branching pattern. Atherosclerotic calcifications are noted without aneurysmal dilatation. Heart is at the upper limits of normal in size. The pulmonary artery shows a normal branching pattern bilaterally. No filling defect to suggest pulmonary embolism is noted. Mediastinum/Nodes: Thoracic inlet is within normal limits. No hilar or mediastinal adenopathy is noted. The esophagus is within normal limits. Lungs/Pleura: Lungs are well aerated bilaterally. Minimal left lower lobe atelectatic changes are seen. No focal infiltrate or sizable effusion is noted. Musculoskeletal: No rib abnormality is noted. Degenerative changes of the thoracic spine are seen. Changes consistent with prior left mastectomy are noted. Review of the MIP images confirms the above findings. CT ABDOMEN and PELVIS FINDINGS Hepatobiliary: Previously visualized cirrhotic change is not as well appreciated on this exam. Status post cholecystectomy. No biliary dilatation. Pancreas: Unremarkable. No pancreatic ductal dilatation or surrounding inflammatory changes. Spleen: Normal in size without focal abnormality. Adrenals/Urinary Tract: Adrenal glands are within normal limits. Kidneys demonstrate a normal enhancement pattern bilaterally. Some scarring is noted in the lower pole of the  right kidney with rim calcification consistent with a ablation defect. No obstructive changes are seen. The bladder is decompressed. Stomach/Bowel: The appendix is well visualized and within normal limits. No obstructive or inflammatory changes of the colon are seen. The stomach and small bowel are unremarkable. Vascular/Lymphatic: Aortic atherosclerosis. No enlarged abdominal or pelvic lymph nodes. Spontaneous left splenorenal shunt is noted stable in appearance from prior exam. Reproductive: Uterus and bilateral adnexa are unremarkable. Other: No abdominal wall hernia or abnormality. No abdominopelvic ascites. Musculoskeletal: No acute or significant osseous findings.  Review of the MIP images confirms the above findings. IMPRESSION: CTA of the chest: No evidence of pulmonary emboli. Mild left basilar atelectasis. CT of the abdomen and pelvis: Right renal ablation defect without complicating factors. Previously seen cirrhotic changes are not as well appreciated on this exam although a left splenorenal shunt is seen. No acute abnormality noted. Electronically Signed   By: Inez Catalina M.D.   On: 12/23/2021 22:28   CT ABDOMEN PELVIS W CONTRAST  Result Date: 12/23/2021 CLINICAL DATA:  Acute abdominal pain on the right side with chest pain, initial encounter EXAM: CT ANGIOGRAPHY CHEST CT ABDOMEN AND PELVIS WITH CONTRAST TECHNIQUE: Multidetector CT imaging of the chest was performed using the standard protocol during bolus administration of intravenous contrast. Multiplanar CT image reconstructions and MIPs were obtained to evaluate the vascular anatomy. Multidetector CT imaging of the abdomen and pelvis was performed using the standard protocol during bolus administration of intravenous contrast. RADIATION DOSE REDUCTION: This exam was performed according to the departmental dose-optimization program which includes automated exposure control, adjustment of the mA and/or kV according to patient size and/or use of  iterative reconstruction technique. CONTRAST:  123m OMNIPAQUE IOHEXOL 350 MG/ML SOLN COMPARISON:  06/17/2025, 11/22/2021 FINDINGS: CTA CHEST FINDINGS Cardiovascular: Thoracic aorta shows a normal branching pattern. Atherosclerotic calcifications are noted without aneurysmal dilatation. Heart is at the upper limits of normal in size. The pulmonary artery shows a normal branching pattern bilaterally. No filling defect to suggest pulmonary embolism is noted. Mediastinum/Nodes: Thoracic inlet is within normal limits. No hilar or mediastinal adenopathy is noted. The esophagus is within normal limits. Lungs/Pleura: Lungs are well aerated bilaterally. Minimal left lower lobe atelectatic changes are seen. No focal infiltrate or sizable effusion is noted. Musculoskeletal: No rib abnormality is noted. Degenerative changes of the thoracic spine are seen. Changes consistent with prior left mastectomy are noted. Review of the MIP images confirms the above findings. CT ABDOMEN and PELVIS FINDINGS Hepatobiliary: Previously visualized cirrhotic change is not as well appreciated on this exam. Status post cholecystectomy. No biliary dilatation. Pancreas: Unremarkable. No pancreatic ductal dilatation or surrounding inflammatory changes. Spleen: Normal in size without focal abnormality. Adrenals/Urinary Tract: Adrenal glands are within normal limits. Kidneys demonstrate a normal enhancement pattern bilaterally. Some scarring is noted in the lower pole of the right kidney with rim calcification consistent with a ablation defect. No obstructive changes are seen. The bladder is decompressed. Stomach/Bowel: The appendix is well visualized and within normal limits. No obstructive or inflammatory changes of the colon are seen. The stomach and small bowel are unremarkable. Vascular/Lymphatic: Aortic atherosclerosis. No enlarged abdominal or pelvic lymph nodes. Spontaneous left splenorenal shunt is noted stable in appearance from prior exam.  Reproductive: Uterus and bilateral adnexa are unremarkable. Other: No abdominal wall hernia or abnormality. No abdominopelvic ascites. Musculoskeletal: No acute or significant osseous findings. Review of the MIP images confirms the above findings. IMPRESSION: CTA of the chest: No evidence of pulmonary emboli. Mild left basilar atelectasis. CT of the abdomen and pelvis: Right renal ablation defect without complicating factors. Previously seen cirrhotic changes are not as well appreciated on this exam although a left splenorenal shunt is seen. No acute abnormality noted. Electronically Signed   By: MInez CatalinaM.D.   On: 12/23/2021 22:28    No results found.  No results found.    Assessment and Plan: Patient Active Problem List   Diagnosis Date Noted   Complex tear of medial meniscus of right knee as current injury 03/04/2020  Aortic atherosclerosis ([place]) 03/04/2020   Osteoarthritis of knee 03/02/2020   Encounter for general adult medical examination with abnormal findings 01/31/2020   Primary osteoarthritis of both knees 01/31/2020   Degenerative disc disease, lumbar 01/31/2020   Genetic testing 11/16/2019   Family history of breast cancer    Family history of liver cancer    Multinodular goiter 10/10/2019   Thyromegaly 09/19/2019   Allergic reaction 07/01/2019   Breast cancer of upper-outer quadrant of left female breast ([place]) 10/27/2018   Routine cervical smear 10/19/2018   Uncontrolled type 2 diabetes mellitus with hyperglycemia ([place]) 10/19/2018   Gastroesophageal reflux disease with esophagitis 10/19/2018   Stomatitis and mucositis 10/19/2018   Urinary tract infection without hematuria 10/19/2018   Dysuria 10/19/2018   Need for vaccination against Streptococcus pneumoniae using pneumococcal conjugate vaccine 13 04/21/2018   Acute bronchitis 10/01/2017   Wheezing 10/01/2017   Sore throat 10/01/2017   Fatigue 09/23/2017   Vitamin D deficiency 09/23/2017   Anemia 09/23/2017    Acute non-recurrent pansinusitis 08/12/2017   Type 2 diabetes mellitus with hyperglycemia, without long-term current use of insulin ([place]) 08/12/2017   Epstein Barr virus infection 08/12/2017   Essential hypertension 05/18/2017   Abnormal CT scan, stomach    Gastric varices    Cellulitis of female breast 12/11/2016   Open wound of breast 12/11/2016   Abdominal distention 01/23/2016   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 12/12/2014   Nausea 09/22/2014   EN (erythema nodosum) 07/26/2014   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 OSA (obstructive sleep apnea) Continue with CPAP on current pressures patient has been tolerating it well.  Good response to treatment.  2. Obesity, morbid ([place]) Obesity Counseling: Had a lengthy discussion regarding patients BMI and weight issues. Patient was instructed on portion control as well as increased activity. Also discussed caloric restrictions with trying to maintain intake less than 2000 Kcal. Discussions were made in accordance with the 5As of weight management. Simple actions such as not eating late and if able to, taking a walk is suggested.   3. Chronic respiratory failure with hypoxia (H[facility]) Oxygen therapy as previously discussed.  4. Chronic liver disease and cirrhosis ([place]) Continue to follow with GI  General Counseling: I have discussed the findings of the evaluation and examination with Danielle Wallace.  I have also discussed any further diagnostic evaluation thatmay be needed or ordered  today. Danielle Wallace verbalizes understanding of the findings of todays visit. We also reviewed her medications today and discussed drug interactions and side effects including but not limited excessive drowsiness and altered mental states. We also discussed that there is always a risk not just to her but also people around her. she has been encouraged to call the office with any questions or concerns that should arise related to todays visit.  No orders of the defined types were placed in this encounter.    Time spent: 46  I have personally obtained a history, examined the patient, evaluated laboratory and imaging results, formulated the assessment and plan and placed orders.  Allyne Gee, MD [facility] Pulmonary and Critical Care Sleep medicine

## 2022-01-30 NOTE — Patient Instructions (Signed)

## 2022-01-31 ENCOUNTER — Other Ambulatory Visit: Payer: Self-pay

## 2022-01-31 MED ORDER — LINACLOTIDE 290 MCG PO CAPS: 290.0000 ug | ORAL_CAPSULE | Freq: Every day | ORAL | 1 refills | Status: DC

## 2022-02-06 DIAGNOSIS — K7460 Unspecified cirrhosis of liver: Secondary | ICD-10-CM | POA: Diagnosis not present

## 2022-02-06 DIAGNOSIS — Z885 Allergy status to narcotic agent status: Secondary | ICD-10-CM | POA: Diagnosis not present

## 2022-02-06 DIAGNOSIS — K760 Fatty (change of) liver, not elsewhere classified: Secondary | ICD-10-CM | POA: Diagnosis not present

## 2022-02-06 DIAGNOSIS — I10 Essential (primary) hypertension: Secondary | ICD-10-CM | POA: Diagnosis not present

## 2022-02-06 DIAGNOSIS — E119 Type 2 diabetes mellitus without complications: Secondary | ICD-10-CM | POA: Diagnosis not present

## 2022-02-06 DIAGNOSIS — Z880 Allergy status to penicillin: Secondary | ICD-10-CM | POA: Diagnosis not present

## 2022-02-06 DIAGNOSIS — E785 Hyperlipidemia, unspecified: Secondary | ICD-10-CM | POA: Diagnosis not present

## 2022-02-06 DIAGNOSIS — R7989 Other specified abnormal findings of blood chemistry: Secondary | ICD-10-CM | POA: Diagnosis not present

## 2022-02-11 DIAGNOSIS — K760 Fatty (change of) liver, not elsewhere classified: Secondary | ICD-10-CM | POA: Diagnosis not present

## 2022-02-11 DIAGNOSIS — K744 Secondary biliary cirrhosis: Secondary | ICD-10-CM | POA: Diagnosis not present

## 2022-02-14 DIAGNOSIS — R0902 Hypoxemia: Secondary | ICD-10-CM | POA: Diagnosis not present

## 2022-02-16 ENCOUNTER — Other Ambulatory Visit: Payer: Self-pay | Admitting: Internal Medicine

## 2022-02-18 ENCOUNTER — Other Ambulatory Visit: Payer: Self-pay

## 2022-02-18 MED ORDER — PROPRANOLOL HCL ER 60 MG PO CP24: 60.0000 mg | ORAL_CAPSULE | Freq: Every day | ORAL | 1 refills | Status: DC

## 2022-02-19 ENCOUNTER — Other Ambulatory Visit: Payer: Self-pay | Admitting: Nurse Practitioner

## 2022-02-19 DIAGNOSIS — J9611 Chronic respiratory failure with hypoxia: Secondary | ICD-10-CM

## 2022-02-19 DIAGNOSIS — E118 Type 2 diabetes mellitus with unspecified complications: Secondary | ICD-10-CM

## 2022-02-19 DIAGNOSIS — Z0001 Encounter for general adult medical examination with abnormal findings: Secondary | ICD-10-CM

## 2022-02-19 DIAGNOSIS — K7460 Unspecified cirrhosis of liver: Secondary | ICD-10-CM

## 2022-02-19 DIAGNOSIS — K7681 Hepatopulmonary syndrome: Secondary | ICD-10-CM

## 2022-02-19 DIAGNOSIS — E1165 Type 2 diabetes mellitus with hyperglycemia: Secondary | ICD-10-CM

## 2022-02-19 DIAGNOSIS — F320 Major depressive disorder, single episode, mild: Secondary | ICD-10-CM

## 2022-02-19 DIAGNOSIS — R300 Dysuria: Secondary | ICD-10-CM

## 2022-02-23 DIAGNOSIS — G4733 Obstructive sleep apnea (adult) (pediatric): Secondary | ICD-10-CM | POA: Diagnosis not present

## 2022-02-25 ENCOUNTER — Ambulatory Visit: Payer: BC Managed Care – PPO

## 2022-02-25 DIAGNOSIS — Z885 Allergy status to narcotic agent status: Secondary | ICD-10-CM | POA: Diagnosis not present

## 2022-02-25 DIAGNOSIS — Z7982 Long term (current) use of aspirin: Secondary | ICD-10-CM | POA: Diagnosis not present

## 2022-02-25 DIAGNOSIS — K7460 Unspecified cirrhosis of liver: Secondary | ICD-10-CM | POA: Diagnosis not present

## 2022-02-25 DIAGNOSIS — Z880 Allergy status to penicillin: Secondary | ICD-10-CM | POA: Diagnosis not present

## 2022-02-25 DIAGNOSIS — E785 Hyperlipidemia, unspecified: Secondary | ICD-10-CM | POA: Diagnosis not present

## 2022-02-25 DIAGNOSIS — Z794 Long term (current) use of insulin: Secondary | ICD-10-CM | POA: Diagnosis not present

## 2022-02-25 DIAGNOSIS — K7581 Nonalcoholic steatohepatitis (NASH): Secondary | ICD-10-CM | POA: Diagnosis not present

## 2022-02-25 DIAGNOSIS — Z882 Allergy status to sulfonamides status: Secondary | ICD-10-CM | POA: Diagnosis not present

## 2022-02-25 DIAGNOSIS — Z881 Allergy status to other antibiotic agents status: Secondary | ICD-10-CM | POA: Diagnosis not present

## 2022-02-25 DIAGNOSIS — Z6832 Body mass index (BMI) 32.0-32.9, adult: Secondary | ICD-10-CM | POA: Diagnosis not present

## 2022-02-25 DIAGNOSIS — I129 Hypertensive chronic kidney disease with stage 1 through stage 4 chronic kidney disease, or unspecified chronic kidney disease: Secondary | ICD-10-CM | POA: Diagnosis not present

## 2022-02-25 DIAGNOSIS — E1122 Type 2 diabetes mellitus with diabetic chronic kidney disease: Secondary | ICD-10-CM | POA: Diagnosis not present

## 2022-02-25 DIAGNOSIS — Z79899 Other long term (current) drug therapy: Secondary | ICD-10-CM | POA: Diagnosis not present

## 2022-02-25 DIAGNOSIS — E669 Obesity, unspecified: Secondary | ICD-10-CM | POA: Diagnosis not present

## 2022-02-25 DIAGNOSIS — N189 Chronic kidney disease, unspecified: Secondary | ICD-10-CM | POA: Diagnosis not present

## 2022-02-25 DIAGNOSIS — I864 Gastric varices: Secondary | ICD-10-CM | POA: Diagnosis not present

## 2022-02-26 ENCOUNTER — Ambulatory Visit
Admission: RE | Admit: 2022-02-26 | Discharge: 2022-02-26 | Disposition: A | Discharge status: Home / Self Care | Payer: BC Managed Care – PPO | Source: Ambulatory Visit | Attending: Internal Medicine | Admitting: Internal Medicine

## 2022-02-26 DIAGNOSIS — Z170 Estrogen receptor positive status [ER+]: Secondary | ICD-10-CM | POA: Diagnosis not present

## 2022-02-26 DIAGNOSIS — C50412 Malignant neoplasm of upper-outer quadrant of left female breast: Secondary | ICD-10-CM | POA: Diagnosis not present

## 2022-02-26 DIAGNOSIS — R911 Solitary pulmonary nodule: Secondary | ICD-10-CM | POA: Diagnosis not present

## 2022-02-26 LAB — POCT I-STAT CREATININE: Creatinine, Ser: 0.5 mg/dL (ref 0.44–1.00)

## 2022-02-26 MED ORDER — IOHEXOL 300 MG/ML  SOLN
75.0000 mL | Freq: Once | INTRAMUSCULAR | Status: AC | PRN
  Administered 2022-02-26: 75 mL via INTRAVENOUS

## 2022-02-27 NOTE — Progress Notes (Signed)
[facility] [place], [place] [address]  Pulmonary Sleep Medicine   Office Visit Note  Patient Name: Danielle Wallace DOB: 06/22/1960 MRN [id number]    Chief Complaint: Obstructive Sleep Apnea visit  Brief History:  Danielle Wallace is seen today for an initial evaluation 2 months after new setup on APAP at 4-16 cmh20. The patient has a 4 month history of sleep apnea. Patient is not using PAP nightly.  The patient feels better rested after sleeping with PAP.  The patient reports benefit from PAP use. Reported sleepiness is  improved and the Epworth Sleepiness Score is 8 out of 24. The patient does take naps. The patient complains of the following: back pain that caused her to leave bed and sleep in recliner without Cpap. She has just started moving the machine to the recliner to continue her therapy there.  She also has a little trouble with eye leak. Suggesting an airtouch seal or liners for this.The compliance download shows 52% compliance with an average use time of 4 hours 21 minutes. The AHI is 1.2.  The patient does not complain of limb movements disrupting sleep.  ROS  General: (-) fever, (-) chills, (-) night sweat Nose and Sinuses: (-) nasal stuffiness or itchiness, (-) postnasal drip, (-) nosebleeds, (-) sinus trouble. Mouth and Throat: (-) sore throat, (-) hoarseness. Neck: (-) swollen glands, (-) enlarged thyroid, (-) neck pain. Respiratory: - cough, 0 shortness of breath, - wheezing. Neurologic: - numbness, - tingling. Psychiatric: - anxiety, - depression   Current Medication: Outpatient Encounter Medications as of 03/03/2022  Medication Sig   albuterol (VENTOLIN HFA) 108 (90 Base) MCG/ACT inhaler TAKE 2 PUFFS BY MOUTH EVERY 6 HOURS AS NEEDED FOR WHEEZE OR SHORTNESS OF BREATH   aspirin EC 81 MG tablet Take 1 tablet (81 mg total) by mouth daily. Swallow whole.   B COMPLEX-C PO Take by mouth daily.   Budeson-Glycopyrrol-Formoterol (BREZTRI AEROSPHERE)  160-9-4.8 MCG/ACT AERO Inhale 2 puffs into the lungs 2 (two) times daily. Pt needs 3 month supply   busPIRone (BUSPAR) 10 MG tablet Take 5 mg by mouth 2 (two) times daily.   Calcium Carb-Cholecalciferol (CALCIUM + D3) 600-200 MG-UNIT TABS Take 1 tablet by mouth 2 (two) times daily.   diphenhydrAMINE (BENADRYL) 25 MG tablet Take 25 mg by mouth every 6 (six) hours as needed.   doxycycline (VIBRA-TABS) 100 MG tablet Take 1 tablet (100 mg total) by mouth 2 (two) times daily.   DULoxetine (CYMBALTA) 20 MG capsule TAKE 1 CAPSULE BY MOUTH EVERY DAY   EPINEPHrine (EPIPEN 2-PAK) 0.3 mg/0.3 mL IJ SOAJ injection Inject 0.3 mLs (0.3 mg total) into the skin as needed.   fluticasone (FLONASE) 50 MCG/ACT nasal spray Place 1 spray into both nostrils daily.   furosemide (LASIX) 20 MG tablet Take 20 mg by mouth daily.   glucose blood (ONETOUCH VERIO) test strip Blood sugar testing done QD and as needed  E11.65   imipramine (TOFRANIL) 25 MG tablet TAKE 2 TABLETS AT BEDTIME   Insulin Pen Needle (BD PEN NEEDLE NANO 2ND GEN) 32G X 4 MM MISC Use as directed to inject [name] Daily into skin E11.65   lactulose (CHRONULAC) 10 GM/15ML solution TAKE 15 MLS (10 G TOTAL) BY MOUTH 2 (TWO) TIMES DAILY. FOR CONSTIPATION   linaclotide (LINZESS) 290 MCG CAPS capsule Take 1 capsule (290 mcg total) by mouth daily before breakfast.   OZEMPIC, 1 MG/DOSE, 4 MG/3ML SOPN INJECT 1 MG UNDER THE SKIN ONCE A WEEK  pantoprazole (PROTONIX) 40 MG tablet Take 1 tablet (40 mg total) by mouth daily.   pravastatin (PRAVACHOL) 20 MG tablet Take 1 tablet (20 mg total) by mouth daily.   propranolol (INDERAL) 10 MG tablet Take 1 tablet (10 mg total) by mouth 3 (three) times daily. If bp is greater than 140   propranolol ER (INDERAL LA) 60 MG 24 hr capsule Take 1 capsule (60 mg total) by mouth daily.   spironolactone (ALDACTONE) 25 MG tablet TAKE 1 TABLET BY MOUTH TWICE A DAY   tamoxifen (NOLVADEX) 10 MG tablet TAKE 1 TABLET DAILY   traMADol (ULTRAM)  50 MG tablet TAKE 1 TABLET BY MOUTH EVERY DAY AS NEEDED ONLY FOR SEVERE PAIN   TRESIBA FLEXTOUCH 100 UNIT/ML FlexTouch Pen INJECT 40 UNITS UNDER THE SKIN DAILY IN THE EVENING. RAISE DOSAGE BY 2 UNITS EVERY 3 DAYS UNTIL BLOOD SUGAR IS 130 OR BELOW   No facility-administered encounter medications on file as of 03/03/2022.    Surgical History: Past Surgical History:  Procedure Laterality Date   BREAST BIOPSY Right 2005   negative   BREAST BIOPSY Left 08/23/2007   negative   BREAST BIOPSY Left 10/24/2013   positive   BREAST BIOPSY Left 07/2002   neg   BREAST BIOPSY Left 08/23/2007   neg   BREAST SURGERY Left 10/2011   Cyst Aspirationapocrine metaplasia, ductal cells and bone cells, hypo-cellular   BREAST SURGERY Left 2009   ADH on stereotactic biopsy, 1.5 mm focus.   BREAST SURGERY Left 2003   fibrocystic changes with ductal hyperplasia without atypia.   BREAST SURGERY Left    mastectomy   BREAST SURGERY Left April 11, 2014   Removal of implant, debridement chest wall [place]   COLONOSCOPY  08/26/2013   Verdie Shire, M.D. normal.   CRYOABLATION  2005, 2010   CYST REMOVAL NECK  10/2011   Dr. Tami Ribas   ESOPHAGOGASTRODUODENOSCOPY (EGD) WITH PROPOFOL N/A 01/16/2017   Procedure: ESOPHAGOGASTRODUODENOSCOPY (EGD) WITH PROPOFOL;  Surgeon: Lucilla Lame, MD;  Location: [facility];  Service: Gastroenterology;  Laterality: N/A;  Diabetic - oral meds   INCISION AND DRAINAGE Left Dec 2015, Feb 2016   Dr Tula Nakayama   MASTECTOMY Left 11/24/2013   positive   PORTACATH PLACEMENT  11/24/13    Medical History: Past Medical History:  Diagnosis Date   Arthritis 2006   Asthma    Breast cancer ([place]) 10/14/2013   18 mm, T1c, N0; ER/ PR positive,her 2 neu overexpressed. Adjuvant chemo/ herceptin.   Cancer [facility]) 2006   Renal cell carcinoma; cryosurgery treatment, right side   Cirrhosis ([place])    Collapsed lung 2007   Diabetes mellitus without  complication ([place]) 3151   Metformin   Diffuse cystic mastopathy    Family history of breast cancer    Family history of liver cancer    Motion sickness    any moving vehicle   Orthodontics    braces   Personal history of chemotherapy    Sinus problem     Family History: Non contributory to the present illness  Social History: Social History   Socioeconomic History   Marital status: Married    Spouse name: Not on file   Number of children: Not on file   Years of education: Not on file   Highest education level: Not on file  Occupational History   Not on file  Tobacco Use   Smoking status: Never  Smokeless tobacco: Never  Vaping Use   Vaping Use: Never used  Substance and Sexual Activity   Alcohol use: Not Currently    Comment: occasionally, none recently   Drug use: No   Sexual activity: Not on file  Other Topics Concern   Not on file  Social History Narrative   Not on file   Social Determinants of Health   Financial Resource Strain: Not on file  Food Insecurity: Not on file  Transportation Needs: Not on file  Physical Activity: Not on file  Stress: Not on file  Social Connections: Not on file  Intimate Partner Violence: Not on file    Vital Signs: There were no vitals taken for this visit. There is no height or weight on file to calculate BMI.    Examination: General Appearance: The patient is well-developed, well-nourished, and in no distress. Neck Circumference: 36cm Skin: Gross inspection of skin unremarkable. Head: normocephalic, no gross deformities. Eyes: no gross deformities noted. ENT: ears appear grossly normal Neurologic: Alert and oriented. No involuntary movements.  STOP BANG RISK ASSESSMENT S (snore) Have you been told that you snore?     No   T (tired) Are you often tired, fatigued, or sleepy during the day?   NO  O (obstruction) Do you stop breathing, choke, or gasp during sleep? NO   P (pressure) Do you have or are you being  treated for high blood pressure? YES   B (BMI) Is your body index greater than 35 kg/m? NO   A (age) Are you 86 years old or older? YES   N (neck) Do you have a neck circumference greater than 16 inches?   NO   G (gender) Are you a female? NO   TOTAL STOP/BANG "YES" ANSWERS 2       A STOP-Bang score of 2 or less is considered low risk, and a score of 5 or more is high risk for having either moderate or severe OSA. For people who score 3 or 4, doctors may need to perform further assessment to determine how likely they are to have OSA.         EPWORTH SLEEPINESS SCALE:  Scale:  (0)= no chance of dozing; (1)= slight chance of dozing; (2)= moderate chance of dozing; (3)= high chance of dozing  Chance  Situtation    Sitting and reading: 1    Watching TV: 2    Sitting Inactive in public: 0    As a passenger in car: 1      Lying down to rest: 3    Sitting and talking: 0    Sitting quielty after lunch: 2    In a car, stopped in traffic: 0   TOTAL SCORE:   8 out of 24    SLEEP STUDIES:  PSG (10/29/21) AHI 8.2, MIN SPO2 69%   CPAP COMPLIANCE DATA:  Date Range: 12/24/2021 - 02/25/2022  Average Daily Use: 4 hours 21 minutes  Median Use: 7 hours 48 minutes  Compliance for > 4 Hours: 33 days  AHI: 1.2 respiratory events per hour  Days Used: 41/64  Mask Leak: 1.1  95th Percentile Pressure: 7 cmh20         LABS: Recent Results (from the past 2160 hour(s))  Lipase, blood     Status: None   Collection Time: 12/23/21  3:53 PM  Result Value Ref Range   Lipase 37 11 - 51 U/L    Comment: Performed at [facility], [address] [place], [place] [address]  Comprehensive metabolic panel     Status: Abnormal   Collection Time: 12/23/21  3:53 PM  Result Value Ref Range   Sodium 137 135 - 145 mmol/L   Potassium 4.0 3.5 - 5.1 mmol/L   Chloride 101 98 - 111 mmol/L   CO2 25 22 - 32 mmol/L   Glucose, Bld 104 (H) 70 - 99 mg/dL    Comment: Glucose  reference range applies only to samples taken after fasting for at least 8 hours.   BUN 14 8 - 23 mg/dL   Creatinine, Ser 0.65 0.44 - 1.00 mg/dL   Calcium 8.9 8.9 - 10.3 mg/dL   Total Protein 7.3 6.5 - 8.1 g/dL   Albumin 3.2 (L) 3.5 - 5.0 g/dL   AST 42 (H) 15 - 41 U/L   ALT 30 0 - 44 U/L   Alkaline Phosphatase 81 38 - 126 U/L   Total Bilirubin 0.6 0.3 - 1.2 mg/dL   GFR, Estimated >60 >60 mL/min    Comment: (NOTE) Calculated using the CKD-EPI Creatinine Equation (2021)    Anion gap 11 5 - 15    Comment: Performed at [facility], [address] [place], [place] [address]  CBC     Status: Abnormal   Collection Time: 12/23/21  3:53 PM  Result Value Ref Range   WBC 13.6 (H) 4.0 - 10.5 K/uL   RBC 4.40 3.87 - 5.11 MIL/uL   Hemoglobin 13.2 12.0 - 15.0 g/dL   HCT 39.8 36.0 - 46.0 %   MCV 90.5 80.0 - 100.0 fL   MCH 30.0 26.0 - 34.0 pg   MCHC 33.2 30.0 - 36.0 g/dL   RDW 11.7 11.5 - 15.5 %   Platelets 380 150 - 400 K/uL   nRBC 0.0 0.0 - 0.2 %    Comment: Performed at [facility], [place], [place] [address]  Troponin I (High Sensitivity)     Status: None   Collection Time: 12/23/21  3:53 PM  Result Value Ref Range   Troponin I (High Sensitivity) 3 <18 ng/L    Comment: (NOTE) Elevated high sensitivity troponin I (hsTnI) values and significant  changes across serial measurements may suggest ACS but many other  chronic and acute conditions are known to elevate hsTnI results.  Refer to the "Links" section for chest pain algorithms and additional  guidance. Performed at [facility], [address] [place], [place] [address]   Urinalysis, Routine w reflex microscopic Urine, Clean Catch     Status: Abnormal   Collection Time: 12/23/21 10:10 PM  Result Value Ref Range   Color, Urine YELLOW (A) YELLOW   APPearance HAZY (A) CLEAR   Specific Gravity, Urine 1.019 1.005 - 1.030   pH 7.0 5.0 - 8.0   Glucose, UA NEGATIVE NEGATIVE mg/dL   Hgb  urine dipstick NEGATIVE NEGATIVE   Bilirubin Urine NEGATIVE NEGATIVE   Ketones, ur 5 (A) NEGATIVE mg/dL   Protein, ur NEGATIVE NEGATIVE mg/dL   Nitrite NEGATIVE NEGATIVE   Leukocytes,Ua NEGATIVE NEGATIVE    Comment: Performed at [facility], [address] [place], [place] [address]  Comprehensive metabolic panel     Status: Abnormal   Collection Time: 01/06/22 11:11 AM  Result Value Ref Range   Glucose 136 (H) 70 - 99 mg/dL   BUN 10 8 - 27 mg/dL   Creatinine, Ser 0.70 0.57 - 1.00 mg/dL   eGFR 98 >59 mL/min/1.73  BUN/Creatinine Ratio 14 12 - 28   Sodium 138 134 - 144 mmol/L   Potassium 4.7 3.5 - 5.2 mmol/L   Chloride 97 96 - 106 mmol/L   CO2 25 20 - 29 mmol/L   Calcium 9.2 8.7 - 10.3 mg/dL   Total Protein 7.1 6.0 - 8.5 g/dL   Albumin 3.8 (L) 3.9 - 4.9 g/dL   Globulin, Total 3.3 1.5 - 4.5 g/dL   Albumin/Globulin Ratio 1.2 1.2 - 2.2   Bilirubin Total 0.4 0.0 - 1.2 mg/dL   Alkaline Phosphatase 98 44 - 121 IU/L   AST 68 (H) 0 - 40 IU/L   ALT 38 (H) 0 - 32 IU/L  POCT HgB A1C     Status: Abnormal   Collection Time: 01/20/22 10:59 AM  Result Value Ref Range   Hemoglobin A1C 6.4 (A) 4.0 - 5.6 %   HbA1c POC (<> result, manual entry)     HbA1c, POC (prediabetic range)     HbA1c, POC (controlled diabetic range)    UA/M w/rflx Culture, Routine     Status: None   Collection Time: 01/20/22 11:36 AM   Specimen: Urine   Urine  Result Value Ref Range   Specific Gravity, UA 1.015 1.005 - 1.030   pH, UA 5.5 5.0 - 7.5   Color, UA Yellow Yellow   Appearance Ur Clear Clear   Leukocytes,UA Negative Negative   Protein,UA Negative Negative/Trace   Glucose, UA Negative Negative   Ketones, UA Negative Negative   RBC, UA Negative Negative   Bilirubin, UA Negative Negative   Urobilinogen, Ur 0.2 0.2 - 1.0 mg/dL   Nitrite, UA Negative Negative   Microscopic Examination Comment     Comment: Microscopic follows if indicated.   Microscopic Examination See below:     Comment:  Microscopic was indicated and was performed.   Urinalysis Reflex Comment     Comment: This specimen will not reflex to a Urine Culture.  Microscopic Examination     Status: Abnormal   Collection Time: 01/20/22 11:36 AM   Urine  Result Value Ref Range   WBC, UA None seen 0 - 5 /hpf   RBC, Urine 0-2 0 - 2 /hpf   Epithelial Cells (non renal) None seen 0 - 10 /hpf   Casts None seen None seen /lpf   Crystals Present (A) N/A   Crystal Type Calcium Oxalate N/A   Bacteria, UA None seen None seen/Few  I-STAT creatinine     Status: None   Collection Time: 02/26/22 10:38 AM  Result Value Ref Range   Creatinine, Ser 0.50 0.44 - 1.00 mg/dL    Radiology: CT Chest W Contrast  Result Date: 02/27/2022 CLINICAL DATA:  Follow-up lung nodules. History of breast cancer and renal cell carcinoma. * Tracking Code: BO * EXAM: CT CHEST WITH CONTRAST TECHNIQUE: Multidetector CT imaging of the chest was performed during intravenous contrast administration. RADIATION DOSE REDUCTION: This exam was performed according to the departmental dose-optimization program which includes automated exposure control, adjustment of the mA and/or kV according to patient size and/or use of iterative reconstruction technique. CONTRAST:  42m OMNIPAQUE IOHEXOL 300 MG/ML  SOLN COMPARISON:  Multiple priors including CT December 23, 2021 and August 12, 2021. FINDINGS: Cardiovascular: Aortic atherosclerosis. Normal caliber thoracic aorta. No central pulmonary embolus on this nondedicated study. Normal size heart. No significant pericardial effusion/thickening. Mediastinum/Nodes: No supraclavicular adenopathy. No suspicious thyroid nodule. No pathologically enlarged mediastinal, hilar or axillary lymph nodes. Stable prominent mediastinal lymph nodes with  a pericardial pericardiophrenic lymph node measuring 5 mm in short axis on image 93/2 unchanged from prior. The esophagus is grossly unremarkable. Lungs/Pleura: Stable triangular 3 mm perifissural  pulmonary nodule on image 78/3. No new suspicious pulmonary nodules or masses. Hypoventilatory change in the dependent lungs. No pleural effusion or pneumothorax. Upper Abdomen: Cirrhotic hepatic morphology. Large left splenorenal shunt. Musculoskeletal: No aggressive lytic or blastic lesion of bone. No acute osseous abnormality. Degenerative change of the bilateral shoulders. IMPRESSION: 1. Stable triangular 3 mm perifissural pulmonary nodule unchanged dating back to 2018, most consistent with a benign intrapulmonary lymph node. 2. No new suspicious pulmonary nodules or masses. 3. Cirrhotic hepatic morphology with large left splenorenal shunt. 4.  Aortic Atherosclerosis (ICD10-I70.0). Electronically Signed   By: Dahlia Bailiff M.D.   On: 02/27/2022 12:13    CT Chest W Contrast  Result Date: 02/27/2022 CLINICAL DATA:  Follow-up lung nodules. History of breast cancer and renal cell carcinoma. * Tracking Code: BO * EXAM: CT CHEST WITH CONTRAST TECHNIQUE: Multidetector CT imaging of the chest was performed during intravenous contrast administration. RADIATION DOSE REDUCTION: This exam was performed according to the departmental dose-optimization program which includes automated exposure control, adjustment of the mA and/or kV according to patient size and/or use of iterative reconstruction technique. CONTRAST:  68m OMNIPAQUE IOHEXOL 300 MG/ML  SOLN COMPARISON:  Multiple priors including CT December 23, 2021 and August 12, 2021. FINDINGS: Cardiovascular: Aortic atherosclerosis. Normal caliber thoracic aorta. No central pulmonary embolus on this nondedicated study. Normal size heart. No significant pericardial effusion/thickening. Mediastinum/Nodes: No supraclavicular adenopathy. No suspicious thyroid nodule. No pathologically enlarged mediastinal, hilar or axillary lymph nodes. Stable prominent mediastinal lymph nodes with a pericardial pericardiophrenic lymph node measuring 5 mm in short axis on image 93/2 unchanged  from prior. The esophagus is grossly unremarkable. Lungs/Pleura: Stable triangular 3 mm perifissural pulmonary nodule on image 78/3. No new suspicious pulmonary nodules or masses. Hypoventilatory change in the dependent lungs. No pleural effusion or pneumothorax. Upper Abdomen: Cirrhotic hepatic morphology. Large left splenorenal shunt. Musculoskeletal: No aggressive lytic or blastic lesion of bone. No acute osseous abnormality. Degenerative change of the bilateral shoulders. IMPRESSION: 1. Stable triangular 3 mm perifissural pulmonary nodule unchanged dating back to 2018, most consistent with a benign intrapulmonary lymph node. 2. No new suspicious pulmonary nodules or masses. 3. Cirrhotic hepatic morphology with large left splenorenal shunt. 4.  Aortic Atherosclerosis (ICD10-I70.0). Electronically Signed   By: JDahlia BailiffM.D.   On: 02/27/2022 12:13    CT Chest W Contrast  Result Date: 02/27/2022 CLINICAL DATA:  Follow-up lung nodules. History of breast cancer and renal cell carcinoma. * Tracking Code: BO * EXAM: CT CHEST WITH CONTRAST TECHNIQUE: Multidetector CT imaging of the chest was performed during intravenous contrast administration. RADIATION DOSE REDUCTION: This exam was performed according to the departmental dose-optimization program which includes automated exposure control, adjustment of the mA and/or kV according to patient size and/or use of iterative reconstruction technique. CONTRAST:  737mOMNIPAQUE IOHEXOL 300 MG/ML  SOLN COMPARISON:  Multiple priors including CT December 23, 2021 and August 12, 2021. FINDINGS: Cardiovascular: Aortic atherosclerosis. Normal caliber thoracic aorta. No central pulmonary embolus on this nondedicated study. Normal size heart. No significant pericardial effusion/thickening. Mediastinum/Nodes: No supraclavicular adenopathy. No suspicious thyroid nodule. No pathologically enlarged mediastinal, hilar or axillary lymph nodes. Stable prominent mediastinal lymph nodes  with a pericardial pericardiophrenic lymph node measuring 5 mm in short axis on image 93/2 unchanged from prior. The esophagus is grossly unremarkable. Lungs/Pleura:  Stable triangular 3 mm perifissural pulmonary nodule on image 78/3. No new suspicious pulmonary nodules or masses. Hypoventilatory change in the dependent lungs. No pleural effusion or pneumothorax. Upper Abdomen: Cirrhotic hepatic morphology. Large left splenorenal shunt. Musculoskeletal: No aggressive lytic or blastic lesion of bone. No acute osseous abnormality. Degenerative change of the bilateral shoulders. IMPRESSION: 1. Stable triangular 3 mm perifissural pulmonary nodule unchanged dating back to 2018, most consistent with a benign intrapulmonary lymph node. 2. No new suspicious pulmonary nodules or masses. 3. Cirrhotic hepatic morphology with large left splenorenal shunt. 4.  Aortic Atherosclerosis (ICD10-I70.0). Electronically Signed   By: Dahlia Bailiff M.D.   On: 02/27/2022 12:13      Assessment and Plan: Patient Active Problem List   Diagnosis Date Noted   Complex tear of medial meniscus of right knee as current injury 03/04/2020   Aortic atherosclerosis ([place]) 03/04/2020   Osteoarthritis of knee 03/02/2020   Encounter for general adult medical examination with abnormal findings 01/31/2020   Primary osteoarthritis of both knees 01/31/2020   Degenerative disc disease, lumbar 01/31/2020   Genetic testing 11/16/2019   Family history of breast cancer    Family history of liver cancer    Multinodular goiter 10/10/2019   Thyromegaly 09/19/2019   Allergic reaction 07/01/2019   Breast cancer of upper-outer quadrant of left female breast ([place]) 10/27/2018   Routine cervical smear 10/19/2018   Uncontrolled type 2 diabetes mellitus with hyperglycemia ([place]) 10/19/2018   Gastroesophageal reflux disease with esophagitis 10/19/2018   Stomatitis and mucositis 10/19/2018   Urinary tract infection without hematuria 10/19/2018   Dysuria  10/19/2018   Need for vaccination against Streptococcus pneumoniae using pneumococcal conjugate vaccine 13 04/21/2018   Acute bronchitis 10/01/2017   Wheezing 10/01/2017   Sore throat 10/01/2017   Fatigue 09/23/2017   Vitamin D deficiency 09/23/2017   Anemia 09/23/2017   Acute non-recurrent pansinusitis 08/12/2017   Type 2 diabetes mellitus with hyperglycemia, without long-term current use of insulin ([place]) 08/12/2017   Epstein Barr virus infection 08/12/2017   Essential hypertension 05/18/2017   Abnormal CT scan, stomach    Gastric varices    Cellulitis of female breast 12/11/2016   Open wound of breast 12/11/2016   Abdominal distention 01/23/2016   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 12/12/2014   Nausea 09/22/2014   EN (erythema nodosum) 07/26/2014   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 OSA on CPAP The patient does tolerate PAP and reports  benefit from PAP use. The patient was reminded how to clean equipment and advised to replace supplies routinely. The patient was also counselled on weight loss. The compliance is poor. The AHI is 1.2.   OSA on cpap- CPAP continues to be medically necessary to treat this patient's OSA.  Increase compliance with pap. F/u 75m  2. CPAP use counseling CPAP Counseling: had a lengthy discussion with the patient regarding the importance of PAP therapy in management of the sleep apnea. Patient appears to understand the risk factor reduction and also understands the risks associated with  untreated sleep apnea. Patient  will try to make a good faith effort to remain compliant with therapy. Also instructed the patient on proper cleaning of the device including the water must be changed daily if possible and use of distilled water is preferred. Patient understands that the machine should be regularly cleaned with appropriate recommended cleaning solutions that do not damage the PAP machine for example given white vinegar and water rinses. Other methods such as ozone treatment may not be as good as these simple methods to achieve cleaning.      General Counseling: I have discussed the findings of the evaluation and examination with Danielle Wallace.  I have also discussed any further diagnostic evaluation thatmay be needed or ordered today. Danielle Wallace verbalizes understanding of the findings of todays visit. We also reviewed her medications today and discussed drug interactions and side effects including but not limited excessive drowsiness and altered mental states. We also discussed that there is always a risk not just to her but also people around her. she has been encouraged to call the office with any questions or concerns that should arise related to todays visit.  No orders of the defined types were placed in this encounter.       I have personally obtained a history, examined the patient, evaluated laboratory and imaging results, formulated the assessment and plan and placed orders. This patient was seen today by Tressie Ellis, PA-C in collaboration with Dr. Devona Konig.   Allyne Gee, MD [facility] Diplomate ABMS Pulmonary Critical Care Medicine and Sleep Medicine

## 2022-02-28 ENCOUNTER — Encounter: Payer: Self-pay | Admitting: Nurse Practitioner

## 2022-03-03 ENCOUNTER — Ambulatory Visit (INDEPENDENT_AMBULATORY_CARE_PROVIDER_SITE_OTHER): Payer: BC Managed Care – PPO | Admitting: Internal Medicine

## 2022-03-03 VITALS — BP 123/75 | HR 76 | Resp 12 | Ht 65.0 in | Wt 191.4 lb

## 2022-03-03 DIAGNOSIS — G4733 Obstructive sleep apnea (adult) (pediatric): Secondary | ICD-10-CM | POA: Diagnosis not present

## 2022-03-03 DIAGNOSIS — Z7189 Other specified counseling: Secondary | ICD-10-CM | POA: Diagnosis not present

## 2022-03-03 NOTE — Patient Instructions (Signed)

## 2022-03-04 ENCOUNTER — Encounter: Payer: Self-pay | Admitting: Internal Medicine

## 2022-03-04 ENCOUNTER — Other Ambulatory Visit: Payer: Self-pay | Admitting: General Surgery

## 2022-03-04 ENCOUNTER — Inpatient Hospital Stay: Payer: BC Managed Care – PPO | Attending: Internal Medicine

## 2022-03-04 ENCOUNTER — Inpatient Hospital Stay (HOSPITAL_BASED_OUTPATIENT_CLINIC_OR_DEPARTMENT_OTHER): Payer: BC Managed Care – PPO | Admitting: Internal Medicine

## 2022-03-04 DIAGNOSIS — Z85528 Personal history of other malignant neoplasm of kidney: Secondary | ICD-10-CM | POA: Insufficient documentation

## 2022-03-04 DIAGNOSIS — R911 Solitary pulmonary nodule: Secondary | ICD-10-CM | POA: Diagnosis not present

## 2022-03-04 DIAGNOSIS — E042 Nontoxic multinodular goiter: Secondary | ICD-10-CM | POA: Insufficient documentation

## 2022-03-04 DIAGNOSIS — Z800 Family history of malignant neoplasm of digestive organs: Secondary | ICD-10-CM | POA: Diagnosis not present

## 2022-03-04 DIAGNOSIS — Z8041 Family history of malignant neoplasm of ovary: Secondary | ICD-10-CM | POA: Diagnosis not present

## 2022-03-04 DIAGNOSIS — Z170 Estrogen receptor positive status [ER+]: Secondary | ICD-10-CM

## 2022-03-04 DIAGNOSIS — Z853 Personal history of malignant neoplasm of breast: Secondary | ICD-10-CM | POA: Diagnosis not present

## 2022-03-04 DIAGNOSIS — M1990 Unspecified osteoarthritis, unspecified site: Secondary | ICD-10-CM | POA: Insufficient documentation

## 2022-03-04 DIAGNOSIS — Z803 Family history of malignant neoplasm of breast: Secondary | ICD-10-CM | POA: Insufficient documentation

## 2022-03-04 DIAGNOSIS — Z1231 Encounter for screening mammogram for malignant neoplasm of breast: Secondary | ICD-10-CM

## 2022-03-04 DIAGNOSIS — E119 Type 2 diabetes mellitus without complications: Secondary | ICD-10-CM | POA: Diagnosis not present

## 2022-03-04 DIAGNOSIS — N951 Menopausal and female climacteric states: Secondary | ICD-10-CM | POA: Insufficient documentation

## 2022-03-04 DIAGNOSIS — K7460 Unspecified cirrhosis of liver: Secondary | ICD-10-CM | POA: Diagnosis not present

## 2022-03-04 DIAGNOSIS — C50412 Malignant neoplasm of upper-outer quadrant of left female breast: Secondary | ICD-10-CM

## 2022-03-04 DIAGNOSIS — I700 Atherosclerosis of aorta: Secondary | ICD-10-CM | POA: Insufficient documentation

## 2022-03-04 DIAGNOSIS — G4733 Obstructive sleep apnea (adult) (pediatric): Secondary | ICD-10-CM | POA: Diagnosis not present

## 2022-03-04 DIAGNOSIS — R232 Flushing: Secondary | ICD-10-CM | POA: Diagnosis not present

## 2022-03-04 DIAGNOSIS — Z79810 Long term (current) use of selective estrogen receptor modulators (SERMs): Secondary | ICD-10-CM | POA: Insufficient documentation

## 2022-03-04 LAB — COMPREHENSIVE METABOLIC PANEL
ALT: 41 U/L (ref 0–44)
AST: 39 U/L (ref 15–41)
Albumin: 3.6 g/dL (ref 3.5–5.0)
Alkaline Phosphatase: 78 U/L (ref 38–126)
Anion gap: 4 — ABNORMAL LOW (ref 5–15)
BUN: 15 mg/dL (ref 8–23)
CO2: 30 mmol/L (ref 22–32)
Calcium: 9.4 mg/dL (ref 8.9–10.3)
Chloride: 103 mmol/L (ref 98–111)
Creatinine, Ser: 0.53 mg/dL (ref 0.44–1.00)
GFR, Estimated: >60 mL/min (ref >60)
Glucose, Bld: 132 mg/dL — ABNORMAL HIGH (ref 70–99)
Potassium: 4.4 mmol/L (ref 3.5–5.1)
Sodium: 137 mmol/L (ref 135–145)
Total Bilirubin: 0.6 mg/dL (ref 0.3–1.2)
Total Protein: 7.4 g/dL (ref 6.5–8.1)

## 2022-03-04 LAB — CBC WITH DIFFERENTIAL/PLATELET
Abs Immature Granulocytes: 0.03 10*3/uL (ref 0.00–0.07)
Basophils Absolute: 0 10*3/uL (ref 0.0–0.1)
Basophils Relative: 0 %
Eosinophils Absolute: 0.1 10*3/uL (ref 0.0–0.5)
Eosinophils Relative: 1 %
HCT: 43.4 % (ref 36.0–46.0)
Hemoglobin: 14 g/dL (ref 12.0–15.0)
Immature Granulocytes: 0 %
Lymphocytes Relative: 22 %
Lymphs Abs: 2.1 10*3/uL (ref 0.7–4.0)
MCH: 29.9 pg (ref 26.0–34.0)
MCHC: 32.3 g/dL (ref 30.0–36.0)
MCV: 92.7 fL (ref 80.0–100.0)
Monocytes Absolute: 0.8 10*3/uL (ref 0.1–1.0)
Monocytes Relative: 8 %
Neutro Abs: 6.4 10*3/uL (ref 1.7–7.7)
Neutrophils Relative %: 69 %
Platelets: 239 10*3/uL (ref 150–400)
RBC: 4.68 MIL/uL (ref 3.87–5.11)
RDW: 12.9 % (ref 11.5–15.5)
WBC: 9.5 10*3/uL (ref 4.0–10.5)
nRBC: 0 % (ref 0.0–0.2)

## 2022-03-04 NOTE — Progress Notes (Signed)
[place] OFFICE PROGRESS NOTE  Patient Care Team: Jonetta Osgood, NP as PCP - General (Nurse Practitioner) Kate Sable, MD as PCP - Cardiology (Cardiology) Bary Castilla, Forest Gleason, MD (General Surgery) Forest Gleason, MD (Oncology) Cammie Sickle, MD as Consulting Physician (Hematology and Oncology)   Cancer Staging  Carcinoma of upper-outer quadrant of left breast in female, estrogen receptor positive [facility]) Staging form: Breast, AJCC 7th Edition - Clinical: No stage assigned - Unsigned    Oncology History Overview Note  # July 2016- 1.carcinoma of breast(left) T1 C. N0 M0 status post ultrasound-guided biopsy Estrogen receptor positive. Progesterone receptor positive.  HER-2/neu amplified by FISH (3.8)  status pos  tnipple sparing mastectomy on the left side with reconstruction (July, 2016) Final staging is  yP1C yNOsn M0  stage 1 c  2.MRSA infection associated with left breast implant (August, 2015) 3.patient started chemotherapy with [facility] from 11th August, 2015. chemotherapy on hold since November because of cellulitis in the chest wall. 4.started on Herceptin May 22, 2014.  Cause of recurrent chest wall  infection chemotherapy was put on hold. 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  Patient is on multiple antibiotic and as per infectious disease specialist similar to stay on antibiotic for 4 months. So chemotherapy has been discontinued. Maintenance Herceptin as well as patient was started on letrozole from June 12, 2014 6.  Port was removed because of Pseudomonas infection of the pocket (August, 2016) 7.  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  Ambulating independently.   Danielle Wallace 61 y.o.  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  In the interim patient underwent evaluation with GI/hepatology at [facility].  She also underwent pulmonary evaluation at [facility] for ongoing shortness of breath.  Her symptoms of shortness of breath improved not resolved.  She is currently on CPAP.  Also increase protein in diet which seems to be helping.  Patient denies any nausea vomiting headaches.  Denies any chest pain.    Review of Systems  Constitutional:  Positive for malaise/fatigue. Negative for chills, diaphoresis, fever and weight loss.  HENT:  Negative for nosebleeds and sore throat.   Eyes:  Negative for double vision.  Respiratory:  Negative for cough, hemoptysis, sputum production, shortness of breath and wheezing.   Cardiovascular:  Negative for chest pain, palpitations, orthopnea and leg swelling.  Gastrointestinal:  Negative for abdominal pain, blood in stool, constipation, diarrhea, heartburn, melena, nausea and vomiting.  Genitourinary:  Negative for dysuria, frequency and urgency.  Musculoskeletal:  Positive for joint pain and myalgias.  Skin: Negative.  Negative for itching and rash.  Neurological:  Negative for dizziness, tingling, focal weakness, weakness and headaches.  Endo/Heme/Allergies:  Does not bruise/bleed easily.  Psychiatric/Behavioral:  Negative for depression. The patient is not nervous/anxious and does not have insomnia.      PAST MEDICAL HISTORY :  Past  Medical History:  Diagnosis Date   Arthritis 2006   Asthma    Breast cancer ([place]) 10/14/2013   18 mm, T1c, N0; ER/ PR positive,her 2 neu overexpressed. Adjuvant chemo/ herceptin.   Cancer [facility]) 2006   Renal cell carcinoma; cryosurgery treatment, right side   Cirrhosis ([place])    Collapsed lung 2007   Diabetes mellitus without complication ([place]) 4128   Metformin   Diffuse cystic mastopathy    Family history of breast cancer    Family history of liver cancer    Motion sickness    any moving vehicle   Orthodontics    braces   Personal history of chemotherapy    Sinus problem     PAST SURGICAL HISTORY :   Past Surgical History:  Procedure Laterality Date   BREAST BIOPSY Right 2005   negative   BREAST BIOPSY Left 08/23/2007   negative   BREAST BIOPSY Left 10/24/2013   positive   BREAST BIOPSY Left 07/2002   neg   BREAST BIOPSY Left 08/23/2007   neg   BREAST SURGERY Left 10/2011   Cyst Aspirationapocrine metaplasia, ductal cells and bone cells, hypo-cellular   BREAST SURGERY Left 2009   ADH on stereotactic biopsy, 1.5 mm focus.   BREAST SURGERY Left 2003   fibrocystic changes with ductal hyperplasia without atypia.   BREAST SURGERY Left    mastectomy   BREAST SURGERY Left April 11, 2014   Removal of implant, debridement chest wall [place]   COLONOSCOPY  08/26/2013   Verdie Shire, M.D. normal.   CRYOABLATION  2005, 2010   CYST REMOVAL NECK  10/2011   Dr. Tami Ribas   ESOPHAGOGASTRODUODENOSCOPY (EGD) WITH PROPOFOL N/A 01/16/2017   Procedure: ESOPHAGOGASTRODUODENOSCOPY (EGD) WITH PROPOFOL;  Surgeon: Lucilla Lame, MD;  Location: [facility];  Service: Gastroenterology;  Laterality: N/A;  Diabetic - oral meds   INCISION AND DRAINAGE Left Dec 2015, Feb 2016   Dr Tula Nakayama   MASTECTOMY Left 11/24/2013   positive   PORTACATH PLACEMENT  11/24/13    FAMILY HISTORY :   Family History  Problem Relation Age of Onset   Liver cancer Father     Hemochromatosis Father    Liver disease Father    Diabetes Mother    Hypertension Mother    Breast cancer Paternal Aunt 58   Ovarian cancer Maternal Grandmother        unk primary, metastatic cancer   Hemachromatosis Daughter    Liver cancer Paternal Uncle    Hemochromatosis Paternal Uncle     SOCIAL HISTORY:   Social History   Tobacco Use   Smoking status: Never   Smokeless tobacco: Never  Vaping Use   Vaping Use: Never used  Substance Use Topics   Alcohol use: Not Currently    Comment: occasionally, none recently   Drug use: No    ALLERGIES:  is allergic to exemestane, floxin [ofloxacin], gluten meal, levofloxacin, linezolid, sesame oil, taxotere [docetaxel], hydrocodone, invokana [canagliflozin], metformin and related, sulfa antibiotics, codeine, penicillins, and vancomycin.  MEDICATIONS:  Current Outpatient Medications  Medication Sig Dispense Refill   albuterol (VENTOLIN HFA) 108 (90 Base) MCG/ACT inhaler TAKE 2 PUFFS BY MOUTH EVERY 6 HOURS AS NEEDED FOR WHEEZE OR SHORTNESS OF BREATH 6.7 each 1   aspirin EC 81 MG tablet  Take 1 tablet (81 mg total) by mouth daily. Swallow whole.     B COMPLEX-C PO Take by mouth daily.     Budeson-Glycopyrrol-Formoterol (BREZTRI AEROSPHERE) 160-9-4.8 MCG/ACT AERO Inhale 2 puffs into the lungs 2 (two) times daily. Pt needs 3 month supply 10.7 g 3   busPIRone (BUSPAR) 10 MG tablet Take 5 mg by mouth 2 (two) times daily.     diphenhydrAMINE (BENADRYL) 25 MG tablet Take 25 mg by mouth every 6 (six) hours as needed.     DULoxetine (CYMBALTA) 20 MG capsule TAKE 1 CAPSULE BY MOUTH EVERY DAY 90 capsule 2   EPINEPHrine (EPIPEN 2-PAK) 0.3 mg/0.3 mL IJ SOAJ injection Inject 0.3 mLs (0.3 mg total) into the skin as needed. 1 each 1   fluticasone (FLONASE) 50 MCG/ACT nasal spray Place 1 spray into both nostrils daily. 16 g 3   furosemide (LASIX) 20 MG tablet Take 20 mg by mouth daily.     glucose blood (ONETOUCH VERIO) test strip Blood sugar testing  done QD and as needed  E11.65 100 each 12   Insulin Pen Needle (BD PEN NEEDLE NANO 2ND GEN) 32G X 4 MM MISC Use as directed to inject [name] Daily into skin E11.65 300 each 3   lactulose (CHRONULAC) 10 GM/15ML solution TAKE 15 MLS (10 G TOTAL) BY MOUTH 2 (TWO) TIMES DAILY. FOR CONSTIPATION 236 mL 3   linaclotide (LINZESS) 290 MCG CAPS capsule Take 1 capsule (290 mcg total) by mouth daily before breakfast. 90 capsule 1   OZEMPIC, 1 MG/DOSE, 4 MG/3ML SOPN INJECT 1 MG UNDER THE SKIN ONCE A WEEK 6 mL 5   propranolol (INDERAL) 10 MG tablet Take 1 tablet (10 mg total) by mouth 3 (three) times daily. If bp is greater than 140 90 tablet 3   propranolol ER (INDERAL LA) 60 MG 24 hr capsule Take 1 capsule (60 mg total) by mouth daily. 90 capsule 1   spironolactone (ALDACTONE) 25 MG tablet TAKE 1 TABLET BY MOUTH TWICE A DAY 180 tablet 1   tamoxifen (NOLVADEX) 10 MG tablet TAKE 1 TABLET DAILY 90 tablet 3   traMADol (ULTRAM) 50 MG tablet TAKE 1 TABLET BY MOUTH EVERY DAY AS NEEDED ONLY FOR SEVERE PAIN 30 tablet 1   TRESIBA FLEXTOUCH 100 UNIT/ML FlexTouch Pen INJECT 40 UNITS UNDER THE SKIN DAILY IN THE EVENING. RAISE DOSAGE BY 2 UNITS EVERY 3 DAYS UNTIL BLOOD SUGAR IS 130 OR BELOW 45 mL 3   Calcium Carb-Cholecalciferol (CALCIUM + D3) 600-200 MG-UNIT TABS Take 1 tablet by mouth 2 (two) times daily. 180 tablet 3   doxycycline (VIBRA-TABS) 100 MG tablet Take 1 tablet (100 mg total) by mouth 2 (two) times daily. 20 tablet 0   imipramine (TOFRANIL) 25 MG tablet TAKE 2 TABLETS AT BEDTIME 180 tablet 3   pantoprazole (PROTONIX) 40 MG tablet Take 1 tablet (40 mg total) by mouth daily. 90 tablet 3   pravastatin (PRAVACHOL) 20 MG tablet Take 1 tablet (20 mg total) by mouth daily. 90 tablet 1   No current facility-administered medications for this visit.    PHYSICAL EXAMINATION: ECOG PERFORMANCE STATUS: 0 - Asymptomatic  BP 112/70   Pulse 74   Temp (!) 96.6 F (35.9 C)   Resp 20   Wt 191 lb 12.8 oz (87 kg)   SpO2  100%   BMI 31.92 kg/m   Filed Weights   03/04/22 1030  Weight: 191 lb 12.8 oz (87 kg)    Physical Exam HENT:  Head: Normocephalic and atraumatic.     Mouth/Throat:     Pharynx: No oropharyngeal exudate.  Eyes:     Pupils: Pupils are equal, round, and reactive to light.  Cardiovascular:     Rate and Rhythm: Normal rate and regular rhythm.  Pulmonary:     Effort: No respiratory distress.     Breath sounds: No wheezing.  Abdominal:     General: Bowel sounds are normal. There is no distension.     Palpations: Abdomen is soft. There is no mass.     Tenderness: There is no abdominal tenderness. There is no guarding or rebound.  Musculoskeletal:        General: No tenderness. Normal range of motion.     Cervical back: Normal range of motion and neck supple.  Skin:    General: Skin is warm.     Comments:    Neurological:     Mental Status: She is alert and oriented to person, place, and time.  Psychiatric:        Mood and Affect: Affect normal.          LABORATORY DATA:  I have reviewed the data as listed    Component Value Date/Time   NA 137 03/04/2022 1014   NA 138 01/06/2022 1111   NA 138 09/04/2014 1358   K 4.4 03/04/2022 1014   K 4.0 09/04/2014 1358   CL 103 03/04/2022 1014   CL 99 (L) 09/04/2014 1358   CO2 30 03/04/2022 1014   CO2 31 09/04/2014 1358   GLUCOSE 132 (H) 03/04/2022 1014   GLUCOSE 136 (H) 09/04/2014 1358   BUN 15 03/04/2022 1014   BUN 10 01/06/2022 1111   BUN 18 09/04/2014 1358   CREATININE 0.53 03/04/2022 1014   CREATININE 0.53 09/04/2014 1358   CALCIUM 9.4 03/04/2022 1014   CALCIUM 9.7 09/04/2014 1358   PROT 7.4 03/04/2022 1014   PROT 7.1 01/06/2022 1111   PROT 7.8 09/04/2014 1358   ALBUMIN 3.6 03/04/2022 1014   ALBUMIN 3.8 (L) 01/06/2022 1111   ALBUMIN 4.1 09/04/2014 1358   AST 39 03/04/2022 1014   AST 32 09/04/2014 1358   ALT 41 03/04/2022 1014   ALT 33 09/04/2014 1358   ALKPHOS 78 03/04/2022 1014   ALKPHOS 73 09/04/2014  1358   BILITOT 0.6 03/04/2022 1014   BILITOT 0.4 01/06/2022 1111   BILITOT 0.6 09/04/2014 1358   GFRNONAA >60 03/04/2022 1014   GFRNONAA >60 09/04/2014 1358   GFRAA 112 01/18/2020 0851   GFRAA >60 09/04/2014 1358    No results found for: "SPEP", "UPEP"  Lab Results  Component Value Date   WBC 9.5 03/04/2022   NEUTROABS 6.4 03/04/2022   HGB 14.0 03/04/2022   HCT 43.4 03/04/2022   MCV 92.7 03/04/2022   PLT 239 03/04/2022      Chemistry      Component Value Date/Time   NA 137 03/04/2022 1014   NA 138 01/06/2022 1111   NA 138 09/04/2014 1358   K 4.4 03/04/2022 1014   K 4.0 09/04/2014 1358   CL 103 03/04/2022 1014   CL 99 (L) 09/04/2014 1358   CO2 30 03/04/2022 1014   CO2 31 09/04/2014 1358   BUN 15 03/04/2022 1014   BUN 10 01/06/2022 1111   BUN 18 09/04/2014 1358   CREATININE 0.53 03/04/2022 1014   CREATININE 0.53 09/04/2014 1358      Component Value Date/Time   CALCIUM 9.4 03/04/2022 1014   CALCIUM 9.7 09/04/2014  1358   ALKPHOS 78 03/04/2022 1014   ALKPHOS 73 09/04/2014 1358   AST 39 03/04/2022 1014   AST 32 09/04/2014 1358   ALT 41 03/04/2022 1014   ALT 33 09/04/2014 1358   BILITOT 0.6 03/04/2022 1014   BILITOT 0.4 01/06/2022 1111   BILITOT 0.6 09/04/2014 1358       RADIOGRAPHIC STUDIES: I have personally reviewed the radiological images as listed and agreed with the findings in the report. No results found.   ASSESSMENT & PLAN:  Carcinoma of upper-outer quadrant of left breast in female, estrogen receptor positive ([place]) # Stage I Breast cancer ER/PR/her 2 neu POS- currently on tamoxifen on extended therapy. On Taomoxifen 10 mg/day [20 mg-arthritis].  Tolerating well.   # Clinically no evidence of recurrence.  STABLE. OCT 2022- Mammogram -WNL- Dr.Byrnet; patient reminded to reach out to Dr. Bary Castilla regarding follow-up.  # hot flashes/ vasomotor symptoms-from Tam-STABLE  # Right LL nodulle 67m [incidental cardiac CT scan FEB 2023]-; OCT 19th, 2023-  Stable triangular 3 mm perifissural pulmonary nodule unchanged dating back to 2018, most consistent with a benign intrapulmonary lymph node; No new suspicious pulmonary nodules or masses.  Would not recommend any further follow-up.  # Cirrhosis; [[facility]-hepatologist/Dr.Wohl] Alpha-1 anti-trypsin- STABLE.   # OSA [[facility]-Pul]- on CPAP- improved   # BMD- [APRIl 2023-]  T score=--0.3 continue  vitamin D- STABLE; mild low ca- 8.8.  Continue calcium; along with vitamin D.  # Right RCC-s/p cryoablation; MRI aug 2022- STABLE.  #Thyroid nodules-incidental [right-3 mm; left 2 nodules-3 and 4 mm] neck US-non-Thyroid  [facility] 2023 monitor for now.  #Incidental findings on Imaging  CT scan OCT 2023: Cirrhotic hepatic morphology with large left splenorenal shunt- see above; Aortic Atherosclerosis; I reviewed/discussed/counseled the patient.   # DISPOSITION:  # follow up in 6 months- MD; cbc/cmp; ..Marland Kitchen--Dr.B  # I reviewed the blood work- with the patient in detail; also reviewed the imaging independently [as summarized above]; and with the patient in detail.       Orders Placed This Encounter  Procedures   CBC with Differential/Platelet    Standing Status:   Future    Standing Expiration Date:   03/05/2023   Comprehensive metabolic panel    Standing Status:   Future    Standing Expiration Date:   03/05/2023   All questions were answered. The patient knows to call the clinic with any problems, questions or concerns.      GCammie Sickle MD 03/04/2022 12:20 PM

## 2022-03-04 NOTE — Progress Notes (Signed)
Patient states no concerns at the moment.

## 2022-03-04 NOTE — Assessment & Plan Note (Addendum)
#   Stage I Breast cancer ER/PR/her 2 neu POS- currently on tamoxifen on extended therapy. On Taomoxifen 10 mg/day [20 mg-arthritis].  Tolerating well.   # Clinically no evidence of recurrence.  STABLE. OCT 2022- Mammogram -WNL- Dr.Byrnet; patient reminded to reach out to Dr. Bary Castilla regarding follow-up.  # hot flashes/ vasomotor symptoms-from Tam-STABLE  # Right LL nodulle 33m [incidental cardiac CT scan FEB 2023]-; OCT 19th, 2023- Stable triangular 3 mm perifissural pulmonary nodule unchanged dating back to 2018, most consistent with a benign intrapulmonary lymph node; No new suspicious pulmonary nodules or masses.  Would not recommend any further follow-up.  # Cirrhosis; [[facility]-hepatologist/Dr.Wohl] Alpha-1 anti-trypsin- STABLE.   # OSA [[facility]-Pul]- on CPAP- improved   # BMD- [APRIl 2023-]  T score=--0.3 continue  vitamin D- STABLE; mild low ca- 8.8.  Continue calcium; along with vitamin D.  # Right RCC-s/p cryoablation; MRI aug 2022- STABLE.  #Thyroid nodules-incidental [right-3 mm; left 2 nodules-3 and 4 mm] neck US-non-Thyroid  [facility] 2023 monitor for now.  #Incidental findings on Imaging  CT scan OCT 2023: Cirrhotic hepatic morphology with large left splenorenal shunt- see above; Aortic Atherosclerosis; I reviewed/discussed/counseled the patient.   # DISPOSITION:  # follow up in 6 months- MD; cbc/cmp; ..Marland Kitchen--Dr.B  # I reviewed the blood work- with the patient in detail; also reviewed the imaging independently [as summarized above]; and with the patient in detail.

## 2022-03-11 ENCOUNTER — Encounter: Payer: Self-pay | Admitting: Internal Medicine

## 2022-03-13 ENCOUNTER — Other Ambulatory Visit: Payer: Self-pay

## 2022-03-13 ENCOUNTER — Other Ambulatory Visit: Payer: Self-pay | Admitting: Nurse Practitioner

## 2022-03-13 DIAGNOSIS — M1990 Unspecified osteoarthritis, unspecified site: Secondary | ICD-10-CM

## 2022-03-13 MED ORDER — AZITHROMYCIN 250 MG PO TABS: ORAL_TABLET | ORAL | 0 refills | Status: DC

## 2022-03-13 NOTE — Telephone Encounter (Signed)
Spoke with pt she is having sinus driange ,coughing and covid test is negative as per lauren send zpak

## 2022-03-13 NOTE — Telephone Encounter (Signed)
Med sent to pharmacy.

## 2022-03-13 NOTE — Telephone Encounter (Signed)
Next in 11/23

## 2022-03-21 ENCOUNTER — Other Ambulatory Visit: Payer: Self-pay | Admitting: Internal Medicine

## 2022-03-25 ENCOUNTER — Encounter: Payer: Self-pay | Admitting: Internal Medicine

## 2022-03-25 ENCOUNTER — Ambulatory Visit (INDEPENDENT_AMBULATORY_CARE_PROVIDER_SITE_OTHER): Payer: BC Managed Care – PPO | Admitting: Internal Medicine

## 2022-03-25 VITALS — BP 109/66 | HR 79 | Temp 97.7°F | Resp 16 | Ht 65.0 in | Wt 190.0 lb

## 2022-03-25 DIAGNOSIS — K5909 Other constipation: Secondary | ICD-10-CM | POA: Diagnosis not present

## 2022-03-25 DIAGNOSIS — Z23 Encounter for immunization: Secondary | ICD-10-CM | POA: Diagnosis not present

## 2022-03-25 DIAGNOSIS — R0602 Shortness of breath: Secondary | ICD-10-CM

## 2022-03-25 DIAGNOSIS — E1165 Type 2 diabetes mellitus with hyperglycemia: Secondary | ICD-10-CM

## 2022-03-25 DIAGNOSIS — J9611 Chronic respiratory failure with hypoxia: Secondary | ICD-10-CM

## 2022-03-25 DIAGNOSIS — K769 Liver disease, unspecified: Secondary | ICD-10-CM

## 2022-03-25 DIAGNOSIS — Z794 Long term (current) use of insulin: Secondary | ICD-10-CM

## 2022-03-25 DIAGNOSIS — J4540 Moderate persistent asthma, uncomplicated: Secondary | ICD-10-CM

## 2022-03-25 DIAGNOSIS — K7460 Unspecified cirrhosis of liver: Secondary | ICD-10-CM

## 2022-03-25 MED ORDER — LACTULOSE 20 GM/30ML PO SOLN: ORAL | 3 refills | Status: DC

## 2022-03-25 NOTE — Progress Notes (Signed)
[facility] [place], [place] [address]  Internal MEDICINE  Office Visit Note  Patient Name: Danielle Wallace  [id number]  [id number]  Date of Service: 03/29/2022  Chief Complaint  Patient presents with   Follow-up   Diabetes    HPI Patient is here for routine follow-up, feels really well She has stopped using her oxygen as a shortness of breath has improved her work-up with GI is complete, did have a liver biopsy done Diabetes is well controlled as well, steadily losing weight Asthma is under good control No episodes of tachycardia noticed   Current Medication: Outpatient Encounter Medications as of 03/25/2022  Medication Sig   albuterol (VENTOLIN HFA) 108 (90 Base) MCG/ACT inhaler TAKE 2 PUFFS BY MOUTH EVERY 6 HOURS AS NEEDED FOR WHEEZE OR SHORTNESS OF BREATH   aspirin EC 81 MG tablet Take 1 tablet (81 mg total) by mouth daily. Swallow whole.   B COMPLEX-C PO Take by mouth daily.   Budeson-Glycopyrrol-Formoterol (BREZTRI AEROSPHERE) 160-9-4.8 MCG/ACT AERO Inhale 2 puffs into the lungs 2 (two) times daily. Pt needs 3 month supply   busPIRone (BUSPAR) 10 MG tablet Take 5 mg by mouth 2 (two) times daily.   diphenhydrAMINE (BENADRYL) 25 MG tablet Take 25 mg by mouth every 6 (six) hours as needed.   DULoxetine (CYMBALTA) 20 MG capsule TAKE 1 CAPSULE BY MOUTH EVERY DAY   EPINEPHrine (EPIPEN 2-PAK) 0.3 mg/0.3 mL IJ SOAJ injection Inject 0.3 mLs (0.3 mg total) into the skin as needed.   fluticasone (FLONASE) 50 MCG/ACT nasal spray Place 1 spray into both nostrils daily.   furosemide (LASIX) 20 MG tablet Take 20 mg by mouth daily.   glucose blood (ONETOUCH VERIO) test strip Blood sugar testing done QD and as needed  E11.65   Insulin Pen Needle (BD PEN NEEDLE NANO 2ND GEN) 32G X 4 MM MISC Use as directed to inject [name] Daily into skin E11.65   Lactulose 20 GM/30ML SOLN 20 gm po bid ( liquid 30 cc) for constipation at a time   linaclotide (LINZESS) 290 MCG  CAPS capsule Take 1 capsule (290 mcg total) by mouth daily before breakfast.   OZEMPIC, 1 MG/DOSE, 4 MG/3ML SOPN INJECT 1 MG UNDER THE SKIN ONCE A WEEK   propranolol (INDERAL) 10 MG tablet Take 1 tablet (10 mg total) by mouth 3 (three) times daily. If bp is greater than 140   propranolol ER (INDERAL LA) 60 MG 24 hr capsule Take 1 capsule (60 mg total) by mouth daily.   spironolactone (ALDACTONE) 25 MG tablet TAKE 1 TABLET BY MOUTH TWICE A DAY   tamoxifen (NOLVADEX) 10 MG tablet TAKE 1 TABLET DAILY   traMADol (ULTRAM) 50 MG tablet TAKE 1 TABLET TWICE A DAY AS NEEDED FOR SEVERE PAIN (INCREASE IN FREQUENCY)   TRESIBA FLEXTOUCH 100 UNIT/ML FlexTouch Pen INJECT 40 UNITS UNDER THE SKIN DAILY IN THE EVENING. RAISE DOSAGE BY 2 UNITS EVERY 3 DAYS UNTIL BLOOD SUGAR IS 130 OR BELOW   [DISCONTINUED] azithromycin (ZITHROMAX) 250 MG tablet Take as directed for 5 days   [DISCONTINUED] lactulose (CHRONULAC) 10 GM/15ML solution TAKE 15 MLS (10 G TOTAL) BY MOUTH 2 (TWO) TIMES DAILY. FOR CONSTIPATION   [DISCONTINUED] Calcium Carb-Cholecalciferol (CALCIUM + D3) 600-200 MG-UNIT TABS Take 1 tablet by mouth 2 (two) times daily.   [DISCONTINUED] doxycycline (VIBRA-TABS) 100 MG tablet Take 1 tablet (100 mg total) by mouth 2 (two) times daily.   [DISCONTINUED] imipramine (TOFRANIL) 25 MG tablet TAKE 2  TABLETS AT BEDTIME   [DISCONTINUED] pantoprazole (PROTONIX) 40 MG tablet Take 1 tablet (40 mg total) by mouth daily.   [DISCONTINUED] pravastatin (PRAVACHOL) 20 MG tablet Take 1 tablet (20 mg total) by mouth daily.   No facility-administered encounter medications on file as of 03/25/2022.    Surgical History: Past Surgical History:  Procedure Laterality Date   BREAST BIOPSY Right 2005   negative   BREAST BIOPSY Left 08/23/2007   negative   BREAST BIOPSY Left 10/24/2013   positive   BREAST BIOPSY Left 07/2002   neg   BREAST BIOPSY Left 08/23/2007   neg   BREAST SURGERY Left 10/2011   Cyst Aspirationapocrine  metaplasia, ductal cells and bone cells, hypo-cellular   BREAST SURGERY Left 2009   ADH on stereotactic biopsy, 1.5 mm focus.   BREAST SURGERY Left 2003   fibrocystic changes with ductal hyperplasia without atypia.   BREAST SURGERY Left    mastectomy   BREAST SURGERY Left April 11, 2014   Removal of implant, debridement chest wall [place]   COLONOSCOPY  08/26/2013   Verdie Shire, M.D. normal.   CRYOABLATION  2005, 2010   CYST REMOVAL NECK  10/2011   Dr. Tami Ribas   ESOPHAGOGASTRODUODENOSCOPY (EGD) WITH PROPOFOL N/A 01/16/2017   Procedure: ESOPHAGOGASTRODUODENOSCOPY (EGD) WITH PROPOFOL;  Surgeon: Lucilla Lame, MD;  Location: [place];  Service: Gastroenterology;  Laterality: N/A;  Diabetic - oral meds   INCISION AND DRAINAGE Left Dec 2015, Feb 2016   Dr Tula Nakayama   MASTECTOMY Left 11/24/2013   positive   PORTACATH PLACEMENT  11/24/13    Medical History: Past Medical History:  Diagnosis Date   Arthritis 2006   Asthma    Breast cancer ([place]) 10/14/2013   18 mm, T1c, N0; ER/ PR positive,her 2 neu overexpressed. Adjuvant chemo/ herceptin.   Cancer [facility]) 2006   Renal cell carcinoma; cryosurgery treatment, right side   Cirrhosis ([place])    Collapsed lung 2007   Diabetes mellitus without complication ([place]) 9509   Metformin   Diffuse cystic mastopathy    Family history of breast cancer    Family history of liver cancer    Motion sickness    any moving vehicle   Orthodontics    braces   Personal history of chemotherapy    Sinus problem     Family History: Family History  Problem Relation Age of Onset   Liver cancer Father    Hemochromatosis Father    Liver disease Father    Diabetes Mother    Hypertension Mother    Breast cancer Paternal Aunt 25   Ovarian cancer Maternal Grandmother        unk primary, metastatic cancer   Hemachromatosis Daughter    Liver cancer Paternal Uncle    Hemochromatosis Paternal Uncle     Social  History   Socioeconomic History   Marital status: Married    Spouse name: Not on file   Number of children: Not on file   Years of education: Not on file   Highest education level: Not on file  Occupational History   Not on file  Tobacco Use   Smoking status: Never   Smokeless tobacco: Never  Vaping Use   Vaping Use: Never used  Substance and Sexual Activity   Alcohol use: Not Currently    Comment: occasionally, none recently   Drug use: No   Sexual activity: Not on file  Other Topics  Concern   Not on file  Social History Narrative   Not on file   Social Determinants of Health   Financial Resource Strain: Not on file  Food Insecurity: Not on file  Transportation Needs: Not on file  Physical Activity: Not on file  Stress: Not on file  Social Connections: Not on file  Intimate Partner Violence: Not on file      Review of Systems  Constitutional:  Negative for chills, fatigue and unexpected weight change.  HENT:  Positive for postnasal drip. Negative for congestion, rhinorrhea, sneezing and sore throat.   Eyes:  Negative for redness.  Respiratory:  Negative for cough, chest tightness and shortness of breath.   Cardiovascular:  Negative for chest pain and palpitations.  Gastrointestinal:  Negative for abdominal pain, constipation, diarrhea, nausea and vomiting.  Genitourinary:  Negative for dysuria and frequency.  Musculoskeletal:  Negative for arthralgias, back pain, joint swelling and neck pain.  Skin:  Negative for rash.  Neurological: Negative.  Negative for tremors and numbness.  Hematological:  Negative for adenopathy. Does not bruise/bleed easily.  Psychiatric/Behavioral:  Negative for behavioral problems (Depression), sleep disturbance and suicidal ideas. The patient is not nervous/anxious.     Vital Signs: BP 109/66   Pulse 79   Temp 97.7 F (36.5 C)   Resp 16   Ht 5' 5"  (1.651 m)   Wt 190 lb (86.2 kg)   SpO2 96%   BMI 31.62 kg/m    Physical  Exam Constitutional:      Appearance: Normal appearance.  HENT:     Head: Normocephalic and atraumatic.     Nose: Nose normal.     Mouth/Throat:     Mouth: Mucous membranes are moist.     Pharynx: No posterior oropharyngeal erythema.  Eyes:     Extraocular Movements: Extraocular movements intact.     Pupils: Pupils are equal, round, and reactive to light.  Cardiovascular:     Pulses: Normal pulses.     Heart sounds: Normal heart sounds.  Pulmonary:     Effort: Pulmonary effort is normal.     Breath sounds: Normal breath sounds.  Musculoskeletal:     Right foot: Normal range of motion.     Left foot: Normal range of motion.  Feet:     Right foot:     Protective Sensation: 2 sites tested.  2 sites sensed.     Skin integrity: Skin integrity normal.     Toenail Condition: Right toenails are normal.     Left foot:     Protective Sensation: 2 sites tested.  2 sites sensed.     Skin integrity: Skin integrity normal.     Toenail Condition: Left toenails are normal.  Neurological:     General: No focal deficit present.     Mental Status: She is alert.  Psychiatric:        Mood and Affect: Mood normal.        Behavior: Behavior normal.        Assessment/Plan: 1. Moderate persistent asthma without complication Patient is continue on Breztri at all times, remarkable improvement in her symptoms  2. Type 2 diabetes mellitus with hyperglycemia, with long-term current use of insulin (HCC) We will continue therapy as before remarkable improvement in her diabetic control, patient has increased her protein intake - Urine Microalbumin w/creat. ratio  3. Chronic constipation We will continue on Linzess as before will increase dose of lactulose - Lactulose 20 GM/30ML SOLN; 20 gm po  bid ( liquid 30 cc) for constipation at a time  Dispense: 1800 mL; Refill: 3  4. Chronic respiratory failure with hypoxia (HCC) Patient has not been using her oxygen her 6-minute walk did not show any  desaturations we will go ahead and discontinue her oxygen - 6 minute walk; Future  5. Chronic liver disease and cirrhosis ([place]) This is managed by GI we will continue to follow along  6. Needs flu shot - Flu Vaccine MDCK QUAD PF   General Counseling: Essence verbalizes understanding of the findings of todays visit and agrees with plan of treatment. I have discussed any further diagnostic evaluation that may be needed or ordered today. We also reviewed her medications today. she has been encouraged to call the office with any questions or concerns that should arise related to todays visit.    Orders Placed This Encounter  Procedures   Flu Vaccine MDCK QUAD PF   Urine Microalbumin w/creat. ratio   6 minute walk    Meds ordered this encounter  Medications   Lactulose 20 GM/30ML SOLN    Sig: 20 gm po bid ( liquid 30 cc) for constipation at a time    Dispense:  1800 mL    Refill:  3    Pt needs a month supply    Total time spent:35 Minutes Time spent includes review of chart, medications, test results, and follow up plan with the patient.   [place] Controlled Substance Database was reviewed by me.   Dr Lavera Guise Internal medicine

## 2022-03-26 DIAGNOSIS — G4733 Obstructive sleep apnea (adult) (pediatric): Secondary | ICD-10-CM | POA: Diagnosis not present

## 2022-03-26 LAB — MICROALBUMIN / CREATININE URINE RATIO
Creatinine, Urine: 83.3 mg/dL
Microalb/Creat Ratio: <4 mg/g creat (ref 0–29)
Microalbumin, Urine: <3 ug/mL

## 2022-04-09 ENCOUNTER — Ambulatory Visit
Admission: RE | Admit: 2022-04-09 | Discharge: 2022-04-09 | Disposition: A | Discharge status: Home / Self Care | Payer: BC Managed Care – PPO | Source: Ambulatory Visit | Attending: General Surgery | Admitting: General Surgery

## 2022-04-09 ENCOUNTER — Other Ambulatory Visit: Payer: Self-pay | Admitting: General Surgery

## 2022-04-09 DIAGNOSIS — Z1231 Encounter for screening mammogram for malignant neoplasm of breast: Secondary | ICD-10-CM | POA: Insufficient documentation

## 2022-04-23 ENCOUNTER — Other Ambulatory Visit: Payer: Self-pay

## 2022-04-23 MED ORDER — SPIRONOLACTONE 25 MG PO TABS: 25.0000 mg | ORAL_TABLET | Freq: Two times a day (BID) | ORAL | 1 refills | Status: DC

## 2022-04-26 DIAGNOSIS — G4733 Obstructive sleep apnea (adult) (pediatric): Secondary | ICD-10-CM | POA: Diagnosis not present

## 2022-04-30 ENCOUNTER — Other Ambulatory Visit: Payer: Self-pay

## 2022-04-30 DIAGNOSIS — Z0001 Encounter for general adult medical examination with abnormal findings: Secondary | ICD-10-CM

## 2022-04-30 DIAGNOSIS — J9611 Chronic respiratory failure with hypoxia: Secondary | ICD-10-CM

## 2022-04-30 DIAGNOSIS — E118 Type 2 diabetes mellitus with unspecified complications: Secondary | ICD-10-CM

## 2022-04-30 DIAGNOSIS — F320 Major depressive disorder, single episode, mild: Secondary | ICD-10-CM

## 2022-04-30 DIAGNOSIS — K7681 Hepatopulmonary syndrome: Secondary | ICD-10-CM

## 2022-04-30 DIAGNOSIS — E1165 Type 2 diabetes mellitus with hyperglycemia: Secondary | ICD-10-CM

## 2022-04-30 DIAGNOSIS — K7460 Unspecified cirrhosis of liver: Secondary | ICD-10-CM

## 2022-04-30 DIAGNOSIS — R300 Dysuria: Secondary | ICD-10-CM

## 2022-04-30 MED ORDER — DULOXETINE HCL 20 MG PO CPEP: 20.0000 mg | ORAL_CAPSULE | Freq: Every day | ORAL | 2 refills | Status: DC

## 2022-05-12 ENCOUNTER — Other Ambulatory Visit: Payer: Self-pay | Admitting: Nurse Practitioner

## 2022-05-12 DIAGNOSIS — M1990 Unspecified osteoarthritis, unspecified site: Secondary | ICD-10-CM

## 2022-05-13 NOTE — Telephone Encounter (Signed)
Please send

## 2022-05-13 NOTE — Telephone Encounter (Signed)
Next appt 05/26/22

## 2022-05-14 NOTE — Telephone Encounter (Signed)
Refill sent.

## 2022-05-26 ENCOUNTER — Encounter: Payer: Self-pay | Admitting: Nurse Practitioner

## 2022-05-26 ENCOUNTER — Ambulatory Visit: Payer: BC Managed Care – PPO | Admitting: Nurse Practitioner

## 2022-05-26 VITALS — BP 128/70 | HR 72 | Temp 98.2°F | Resp 16 | Ht 65.0 in | Wt 185.8 lb

## 2022-05-26 DIAGNOSIS — K7460 Unspecified cirrhosis of liver: Secondary | ICD-10-CM

## 2022-05-26 DIAGNOSIS — E1165 Type 2 diabetes mellitus with hyperglycemia: Secondary | ICD-10-CM

## 2022-05-26 DIAGNOSIS — R300 Dysuria: Secondary | ICD-10-CM

## 2022-05-26 DIAGNOSIS — J4540 Moderate persistent asthma, uncomplicated: Secondary | ICD-10-CM

## 2022-05-26 DIAGNOSIS — K769 Liver disease, unspecified: Secondary | ICD-10-CM

## 2022-05-26 DIAGNOSIS — Z794 Long term (current) use of insulin: Secondary | ICD-10-CM

## 2022-05-26 DIAGNOSIS — E118 Type 2 diabetes mellitus with unspecified complications: Secondary | ICD-10-CM

## 2022-05-26 DIAGNOSIS — J9611 Chronic respiratory failure with hypoxia: Secondary | ICD-10-CM | POA: Diagnosis not present

## 2022-05-26 DIAGNOSIS — Z0001 Encounter for general adult medical examination with abnormal findings: Secondary | ICD-10-CM

## 2022-05-26 DIAGNOSIS — K7681 Hepatopulmonary syndrome: Secondary | ICD-10-CM

## 2022-05-26 DIAGNOSIS — F320 Major depressive disorder, single episode, mild: Secondary | ICD-10-CM | POA: Diagnosis not present

## 2022-05-26 LAB — POCT GLYCOSYLATED HEMOGLOBIN (HGB A1C): Hemoglobin A1C: 6.3 % — AB (ref 4.0–5.6)

## 2022-05-26 MED ORDER — DULOXETINE HCL 20 MG PO CPEP: 20.0000 mg | ORAL_CAPSULE | Freq: Every day | ORAL | 2 refills | Status: DC

## 2022-05-26 MED ORDER — OZEMPIC (1 MG/DOSE) 4 MG/3ML ~~LOC~~ SOPN: PEN_INJECTOR | SUBCUTANEOUS | 5 refills | Status: DC

## 2022-05-26 NOTE — Progress Notes (Addendum)
[facility] [place], [place] [address]  Internal MEDICINE  Office Visit Note  Patient Name: Danielle Wallace  [id number]  [id number]  Date of Service: 05/26/2022  Chief Complaint  Patient presents with   Follow-up   Diabetes   Quality Metric Gaps    TDAP, Shingles and Foot Exam    HPI Danielle Wallace presents for a follow-up visit for diabetes, cirrhosis, and chronic respiratory failure.  Diabetes -- A1c done today and is 6.3, slightly improved from last visit, needs foot exam today. Has previously tried and failed invokana '300mg'$ , glipizide '5mg'$  , humalog, and metformin 500 mg.  Cirrhosis -- had liver bx done previously  Chronic respiratory failure -- on breztri, has stopped using oxygen as of last visit. Feeling like chest is tighter and sometimes burning, but oxygen sats are still up  Muscle cramps/arthritis in back. --flare up Needs refills of ozempic and duloxetine       Current Medication: Outpatient Encounter Medications as of 05/26/2022  Medication Sig   albuterol (VENTOLIN HFA) 108 (90 Base) MCG/ACT inhaler TAKE 2 PUFFS BY MOUTH EVERY 6 HOURS AS NEEDED FOR WHEEZE OR SHORTNESS OF BREATH   aspirin EC 81 MG tablet Take 1 tablet (81 mg total) by mouth daily. Swallow whole.   B COMPLEX-C PO Take by mouth daily.   Budeson-Glycopyrrol-Formoterol (BREZTRI AEROSPHERE) 160-9-4.8 MCG/ACT AERO Inhale 2 puffs into the lungs 2 (two) times daily. Pt needs 3 month supply   busPIRone (BUSPAR) 10 MG tablet Take 5 mg by mouth 2 (two) times daily.   diphenhydrAMINE (BENADRYL) 25 MG tablet Take 25 mg by mouth every 6 (six) hours as needed.   EPINEPHrine (EPIPEN 2-PAK) 0.3 mg/0.3 mL IJ SOAJ injection Inject 0.3 mLs (0.3 mg total) into the skin as needed.   fluticasone (FLONASE) 50 MCG/ACT nasal spray Place 1 spray into both nostrils daily.   furosemide (LASIX) 20 MG tablet Take 20 mg by mouth daily.   glucose blood (ONETOUCH VERIO) test strip Blood sugar testing done QD  and as needed  E11.65   Insulin Pen Needle (BD PEN NEEDLE NANO 2ND GEN) 32G X 4 MM MISC Use as directed to inject [name] Daily into skin E11.65   Lactulose 20 GM/30ML SOLN 20 gm po bid ( liquid 30 cc) for constipation at a time   linaclotide (LINZESS) 290 MCG CAPS capsule Take 1 capsule (290 mcg total) by mouth daily before breakfast.   propranolol (INDERAL) 10 MG tablet Take 1 tablet (10 mg total) by mouth 3 (three) times daily. If bp is greater than 140   propranolol ER (INDERAL LA) 60 MG 24 hr capsule Take 1 capsule (60 mg total) by mouth daily.   spironolactone (ALDACTONE) 25 MG tablet Take 1 tablet (25 mg total) by mouth 2 (two) times daily.   tamoxifen (NOLVADEX) 10 MG tablet TAKE 1 TABLET DAILY   traMADol (ULTRAM) 50 MG tablet TAKE 1 TABLET TWICE A DAY AS NEEDED FOR SEVERE PAIN (INCREASE IN FREQUENCY)   TRESIBA FLEXTOUCH 100 UNIT/ML FlexTouch Pen INJECT 40 UNITS UNDER THE SKIN DAILY IN THE EVENING. RAISE DOSAGE BY 2 UNITS EVERY 3 DAYS UNTIL BLOOD SUGAR IS 130 OR BELOW   [DISCONTINUED] DULoxetine (CYMBALTA) 20 MG capsule Take 1 capsule (20 mg total) by mouth daily.   [DISCONTINUED] OZEMPIC, 1 MG/DOSE, 4 MG/3ML SOPN INJECT 1 MG UNDER THE SKIN ONCE A WEEK   DULoxetine (CYMBALTA) 20 MG capsule Take 1 capsule (20 mg total) by mouth daily.  Semaglutide, 1 MG/DOSE, (OZEMPIC, 1 MG/DOSE,) 4 MG/3ML SOPN INJECT 1 MG UNDER THE SKIN ONCE A WEEK   [DISCONTINUED] DULoxetine (CYMBALTA) 20 MG capsule Take 1 capsule (20 mg total) by mouth daily.   [DISCONTINUED] DULoxetine (CYMBALTA) 20 MG capsule Take 1 capsule (20 mg total) by mouth daily.   [DISCONTINUED] DULoxetine (CYMBALTA) 20 MG capsule Take 1 capsule (20 mg total) by mouth daily.   No facility-administered encounter medications on file as of 05/26/2022.    Surgical History: Past Surgical History:  Procedure Laterality Date   BREAST BIOPSY Right 2005   negative   BREAST BIOPSY Left 08/23/2007   negative   BREAST BIOPSY Left 10/24/2013    positive   BREAST BIOPSY Left 07/2002   neg   BREAST BIOPSY Left 08/23/2007   neg   BREAST SURGERY Left 10/2011   Cyst Aspirationapocrine metaplasia, ductal cells and bone cells, hypo-cellular   BREAST SURGERY Left 2009   ADH on stereotactic biopsy, 1.5 mm focus.   BREAST SURGERY Left 2003   fibrocystic changes with ductal hyperplasia without atypia.   BREAST SURGERY Left    mastectomy   BREAST SURGERY Left April 11, 2014   Removal of implant, debridement chest wall [place]   COLONOSCOPY  08/26/2013   Verdie Shire, M.D. normal.   CRYOABLATION  2005, 2010   CYST REMOVAL NECK  10/2011   Dr. Tami Ribas   ESOPHAGOGASTRODUODENOSCOPY (EGD) WITH PROPOFOL N/A 01/16/2017   Procedure: ESOPHAGOGASTRODUODENOSCOPY (EGD) WITH PROPOFOL;  Surgeon: Lucilla Lame, MD;  Location: [facility];  Service: Gastroenterology;  Laterality: N/A;  Diabetic - oral meds   INCISION AND DRAINAGE Left Dec 2015, Feb 2016   Dr Tula Nakayama   MASTECTOMY Left 11/24/2013   positive   PORTACATH PLACEMENT  11/24/13    Medical History: Past Medical History:  Diagnosis Date   Arthritis 2006   Asthma    Breast cancer ([place]) 10/14/2013   18 mm, T1c, N0; ER/ PR positive,her 2 neu overexpressed. Adjuvant chemo/ herceptin.   Cancer [facility]) 2006   Renal cell carcinoma; cryosurgery treatment, right side   Cirrhosis ([place])    Collapsed lung 2007   Diabetes mellitus without complication ([place]) 4970   Metformin   Diffuse cystic mastopathy    Family history of breast cancer    Family history of liver cancer    Motion sickness    any moving vehicle   Orthodontics    braces   Personal history of chemotherapy    Sinus problem     Family History: Family History  Problem Relation Age of Onset   Liver cancer Father    Hemochromatosis Father    Liver disease Father    Diabetes Mother    Hypertension Mother    Breast cancer Paternal Aunt 11   Ovarian cancer Maternal Grandmother         unk primary, metastatic cancer   Hemachromatosis Daughter    Liver cancer Paternal Uncle    Hemochromatosis Paternal Uncle     Social History   Socioeconomic History   Marital status: Married    Spouse name: Not on file   Number of children: Not on file   Years of education: Not on file   Highest education level: Not on file  Occupational History   Not on file  Tobacco Use   Smoking status: Never   Smokeless tobacco: Never  Vaping Use   Vaping Use: Never used  Substance and Sexual Activity   Alcohol use: Not Currently    Comment: occasionally, none recently   Drug use: No   Sexual activity: Not on file  Other Topics Concern   Not on file  Social History Narrative   Not on file   Social Determinants of Health   Financial Resource Strain: Not on file  Food Insecurity: Not on file  Transportation Needs: Not on file  Physical Activity: Not on file  Stress: Not on file  Social Connections: Not on file  Intimate Partner Violence: Not on file      Review of Systems  Constitutional:  Negative for chills, fatigue and unexpected weight change.  HENT:  Positive for postnasal drip. Negative for congestion, rhinorrhea, sneezing and sore throat.   Eyes:  Negative for redness.  Respiratory:  Negative for cough, chest tightness and shortness of breath.   Cardiovascular:  Negative for chest pain and palpitations.  Gastrointestinal:  Negative for abdominal pain, constipation, diarrhea, nausea and vomiting.  Genitourinary:  Negative for dysuria and frequency.  Musculoskeletal:  Negative for arthralgias, back pain, joint swelling and neck pain.  Skin:  Negative for rash.  Neurological: Negative.  Negative for tremors and numbness.  Hematological:  Negative for adenopathy. Does not bruise/bleed easily.  Psychiatric/Behavioral:  Negative for behavioral problems (Depression), sleep disturbance and suicidal ideas. The patient is not nervous/anxious.     Vital Signs: BP 128/70    Pulse 72   Temp 98.2 F (36.8 C)   Resp 16   Ht '5\' 5"'$  (1.651 m)   Wt 185 lb 12.8 oz (84.3 kg)   SpO2 97%   BMI 30.92 kg/m    Physical Exam Vitals reviewed.  Constitutional:      Appearance: Normal appearance.  HENT:     Head: Normocephalic and atraumatic.     Nose: Nose normal.     Mouth/Throat:     Mouth: Mucous membranes are moist.     Pharynx: No posterior oropharyngeal erythema.  Eyes:     Extraocular Movements: Extraocular movements intact.     Pupils: Pupils are equal, round, and reactive to light.  Cardiovascular:     Pulses: Normal pulses.          Dorsalis pedis pulses are 2+ on the right side and 2+ on the left side.       Posterior tibial pulses are 2+ on the right side and 2+ on the left side.     Heart sounds: Normal heart sounds.  Pulmonary:     Effort: Pulmonary effort is normal.     Breath sounds: Normal breath sounds.  Musculoskeletal:     Right foot: Normal range of motion. No deformity, bunion, Charcot foot, foot drop or prominent metatarsal heads.     Left foot: Normal range of motion. No deformity, bunion, Charcot foot, foot drop or prominent metatarsal heads.  Feet:     Right foot:     Protective Sensation: 6 sites tested.  6 sites sensed.     Skin integrity: Skin integrity normal.     Toenail Condition: Right toenails are normal.     Left foot:     Protective Sensation: 6 sites tested.  6 sites sensed.     Skin integrity: Skin integrity normal.     Toenail Condition: Left toenails are normal.  Neurological:     General: No focal deficit present.     Mental Status: She is alert.  Psychiatric:  Mood and Affect: Mood normal.        Behavior: Behavior normal.    Diabetic Foot Exam - Simple   Simple Foot Form Diabetic Foot exam was performed with the following findings: Yes 05/26/2022  9:00 AM  Visual Inspection Sensation Testing Pulse Check Comments        Assessment/Plan: 1. Type 2 diabetes mellitus with hyperglycemia, with  long-term current use of insulin (HCC) A1c still improving 6.3 today. Continue ozempic as prescribed. Refills ordered. Repeat A1c in 3 months - POCT HgB A1C - Semaglutide, 1 MG/DOSE, (OZEMPIC, 1 MG/DOSE,) 4 MG/3ML SOPN; INJECT 1 MG UNDER THE SKIN ONCE A WEEK  Dispense: 6 mL; Refill: 5  2. Chronic respiratory failure with hypoxia (HCC) Off oxygen. Using breztri inhaler as prescribed, continue.   3. Chronic liver disease and cirrhosis (HCC) Followed by [facility] GI, had liver biopsy done  4. Depression, major, single episode, mild (HCC) Stable, continue duloxetine as prescribed - DULoxetine (CYMBALTA) 20 MG capsule; Take 1 capsule (20 mg total) by mouth daily.  Dispense: 90 capsule; Refill: 2   General Counseling: Kelyn verbalizes understanding of the findings of todays visit and agrees with plan of treatment. I have discussed any further diagnostic evaluation that may be needed or ordered today. We also reviewed her medications today. she has been encouraged to call the office with any questions or concerns that should arise related to todays visit.    Orders Placed This Encounter  Procedures   POCT HgB A1C    Meds ordered this encounter  Medications   Semaglutide, 1 MG/DOSE, (OZEMPIC, 1 MG/DOSE,) 4 MG/3ML SOPN    Sig: INJECT 1 MG UNDER THE SKIN ONCE A WEEK    Dispense:  6 mL    Refill:  5    Please send asap and please send prior auth if required to [place] asap at [phone] fax #   DISCONTD: DULoxetine (CYMBALTA) 20 MG capsule    Sig: Take 1 capsule (20 mg total) by mouth daily.    Dispense:  90 capsule    Refill:  2    Patient never received refill that was send on 05/07/22 and was unable to contact you within the app since there is no way of reporting a missing prescription or one that is undelivered. Please send refill ASAP, she is about to run out   DISCONTD: DULoxetine (CYMBALTA) 20 MG capsule    Sig: Take 1 capsule (20 mg total) by mouth daily.    Dispense:  90 capsule     Refill:  2    Patient never received refill that was send on 05/07/22 and was unable to contact you within the app since there is no way of reporting a missing prescription or one that is undelivered. Please send refill ASAP, she is about to run out   DISCONTD: DULoxetine (CYMBALTA) 20 MG capsule    Sig: Take 1 capsule (20 mg total) by mouth daily.    Dispense:  90 capsule    Refill:  2    Patient never received refill that was send on 05/07/22 and was unable to contact you within the app since there is no way of reporting a missing prescription or one that is undelivered. Please send refill ASAP, she is about to run out   DULoxetine (CYMBALTA) 20 MG capsule    Sig: Take 1 capsule (20 mg total) by mouth daily.    Dispense:  90 capsule    Refill:  2  Patient never received refill that was send on 05/07/22 was unable to contact you in the app since there is no way of reporting a missing prescription or undelivered. Please send refill ASAP, about to run out    Return in about 3 months (around 08/26/2022) for F/U, Recheck A1C, Ryah Cribb PCP.   Total time spent:30 Minutes Time spent includes review of chart, medications, test results, and follow up plan with the patient.   [place] Controlled Substance Database was reviewed by me.  This patient was seen by Jonetta Osgood, FNP-C in collaboration with Dr. Clayborn Bigness as a part of collaborative care agreement.   Reynoldo Mainer R. Valetta Fuller, MSN, FNP-C Internal medicine

## 2022-05-27 DIAGNOSIS — G4733 Obstructive sleep apnea (adult) (pediatric): Secondary | ICD-10-CM | POA: Diagnosis not present

## 2022-05-28 ENCOUNTER — Encounter: Payer: Self-pay | Admitting: Nurse Practitioner

## 2022-06-03 ENCOUNTER — Telehealth: Payer: Self-pay

## 2022-06-03 NOTE — Telephone Encounter (Signed)
Patient was notified that Ozempic was approved.

## 2022-06-04 ENCOUNTER — Telehealth: Payer: Self-pay

## 2022-06-04 MED ORDER — DOXYCYCLINE HYCLATE 100 MG PO TABS: 100.0000 mg | ORAL_TABLET | Freq: Two times a day (BID) | ORAL | 0 refills | Status: DC

## 2022-06-04 MED ORDER — PREDNISONE 10 MG (21) PO TBPK: ORAL_TABLET | ORAL | 0 refills | Status: DC

## 2022-06-04 NOTE — Telephone Encounter (Signed)
Pt advised we send antibiotic not feeling better need appt in person

## 2022-06-05 ENCOUNTER — Telehealth: Payer: BC Managed Care – PPO | Admitting: Physician Assistant

## 2022-06-09 ENCOUNTER — Encounter: Payer: Self-pay | Admitting: Nurse Practitioner

## 2022-06-09 ENCOUNTER — Other Ambulatory Visit: Payer: Self-pay

## 2022-06-09 DIAGNOSIS — Z794 Long term (current) use of insulin: Secondary | ICD-10-CM

## 2022-06-09 MED ORDER — OZEMPIC (1 MG/DOSE) 4 MG/3ML ~~LOC~~ SOPN: PEN_INJECTOR | SUBCUTANEOUS | 5 refills | Status: DC

## 2022-06-09 NOTE — Telephone Encounter (Signed)
Spoke with pt send  pres to [facility]

## 2022-06-12 ENCOUNTER — Telehealth: Payer: Self-pay

## 2022-06-12 NOTE — Telephone Encounter (Signed)
Pt called that she is having sinus infection, lung burning ,low grade fever and sob and numbness and tingling right side of face and no chest pain as per Dr Humphrey Rolls  advised she need go to ED

## 2022-06-13 ENCOUNTER — Telehealth: Payer: Self-pay

## 2022-06-13 ENCOUNTER — Ambulatory Visit: Payer: BC Managed Care – PPO | Admitting: Physician Assistant

## 2022-06-13 NOTE — Telephone Encounter (Signed)
Spoke with pt  she never went to ED she said  she was feeling better  she stopped doxycycline and also make her appt with [facility] and advised her if its worse go to ED

## 2022-06-17 ENCOUNTER — Encounter: Payer: Self-pay | Admitting: Internal Medicine

## 2022-06-17 ENCOUNTER — Ambulatory Visit
Admission: RE | Admit: 2022-06-17 | Discharge: 2022-06-17 | Disposition: A | Discharge status: Home / Self Care | Payer: BC Managed Care – PPO | Source: Ambulatory Visit | Attending: Internal Medicine | Admitting: Internal Medicine

## 2022-06-17 ENCOUNTER — Ambulatory Visit (INDEPENDENT_AMBULATORY_CARE_PROVIDER_SITE_OTHER): Payer: BC Managed Care – PPO | Admitting: Internal Medicine

## 2022-06-17 ENCOUNTER — Ambulatory Visit
Admission: RE | Admit: 2022-06-17 | Discharge: 2022-06-17 | Disposition: A | Discharge status: Home / Self Care | Payer: BC Managed Care – PPO | Attending: Internal Medicine | Admitting: Internal Medicine

## 2022-06-17 VITALS — BP 127/76 | HR 80 | Temp 98.2°F | Resp 16 | Ht 65.0 in | Wt 191.0 lb

## 2022-06-17 DIAGNOSIS — J410 Simple chronic bronchitis: Secondary | ICD-10-CM | POA: Diagnosis not present

## 2022-06-17 DIAGNOSIS — H9193 Unspecified hearing loss, bilateral: Secondary | ICD-10-CM

## 2022-06-17 DIAGNOSIS — R0602 Shortness of breath: Secondary | ICD-10-CM

## 2022-06-17 DIAGNOSIS — R5381 Other malaise: Secondary | ICD-10-CM | POA: Insufficient documentation

## 2022-06-17 DIAGNOSIS — B948 Sequelae of other specified infectious and parasitic diseases: Secondary | ICD-10-CM | POA: Diagnosis not present

## 2022-06-17 DIAGNOSIS — R0789 Other chest pain: Secondary | ICD-10-CM | POA: Diagnosis not present

## 2022-06-17 NOTE — Progress Notes (Unsigned)
[facility] [place], [place] [address]  Internal MEDICINE  Office Visit Note  Patient Name: Danielle Wallace  [id number]  [id number]  Date of Service: 06/24/2022  Chief Complaint  Patient presents with   Acute Visit   Sinusitis   Medication Reaction    Possible reaction to doxycycline    HPI Pt is here for follow up She was treated by urgent care for acute sinusitis with Doxy, developed a allergic reaction Feels tired and weak, Oxygen level is normal, mild SOB  Since this episode, she has been having problem hearing as well Diabetes under good control     Current Medication: Outpatient Encounter Medications as of 06/17/2022  Medication Sig   albuterol (VENTOLIN HFA) 108 (90 Base) MCG/ACT inhaler TAKE 2 PUFFS BY MOUTH EVERY 6 HOURS AS NEEDED FOR WHEEZE OR SHORTNESS OF BREATH   aspirin EC 81 MG tablet Take 1 tablet (81 mg total) by mouth daily. Swallow whole.   B COMPLEX-C PO Take by mouth daily.   Budeson-Glycopyrrol-Formoterol (BREZTRI AEROSPHERE) 160-9-4.8 MCG/ACT AERO Inhale 2 puffs into the lungs 2 (two) times daily. Pt needs 3 month supply   busPIRone (BUSPAR) 10 MG tablet Take 5 mg by mouth 2 (two) times daily.   diphenhydrAMINE (BENADRYL) 25 MG tablet Take 25 mg by mouth every 6 (six) hours as needed.   DULoxetine (CYMBALTA) 20 MG capsule Take 1 capsule (20 mg total) by mouth daily.   EPINEPHrine (EPIPEN 2-PAK) 0.3 mg/0.3 mL IJ SOAJ injection Inject 0.3 mLs (0.3 mg total) into the skin as needed.   fluticasone (FLONASE) 50 MCG/ACT nasal spray Place 1 spray into both nostrils daily.   furosemide (LASIX) 20 MG tablet Take 20 mg by mouth daily.   glucose blood (ONETOUCH VERIO) test strip Blood sugar testing done QD and as needed  E11.65   Insulin Pen Needle (BD PEN NEEDLE NANO 2ND GEN) 32G X 4 MM MISC Use as directed to inject [name] Daily into skin E11.65   Lactulose 20 GM/30ML SOLN 20 gm po bid ( liquid 30 cc) for constipation at a time    linaclotide (LINZESS) 290 MCG CAPS capsule Take 1 capsule (290 mcg total) by mouth daily before breakfast.   propranolol (INDERAL) 10 MG tablet Take 1 tablet (10 mg total) by mouth 3 (three) times daily. If bp is greater than 140   propranolol ER (INDERAL LA) 60 MG 24 hr capsule Take 1 capsule (60 mg total) by mouth daily.   Semaglutide, 1 MG/DOSE, (OZEMPIC, 1 MG/DOSE,) 4 MG/3ML SOPN INJECT 1 MG UNDER THE SKIN ONCE A WEEK   spironolactone (ALDACTONE) 25 MG tablet Take 1 tablet (25 mg total) by mouth 2 (two) times daily.   tamoxifen (NOLVADEX) 10 MG tablet TAKE 1 TABLET DAILY   traMADol (ULTRAM) 50 MG tablet TAKE 1 TABLET TWICE A DAY AS NEEDED FOR SEVERE PAIN (INCREASE IN FREQUENCY)   TRESIBA FLEXTOUCH 100 UNIT/ML FlexTouch Pen INJECT 40 UNITS UNDER THE SKIN DAILY IN THE EVENING. RAISE DOSAGE BY 2 UNITS EVERY 3 DAYS UNTIL BLOOD SUGAR IS 130 OR BELOW   [DISCONTINUED] doxycycline (VIBRA-TABS) 100 MG tablet Take 1 tablet (100 mg total) by mouth 2 (two) times daily.   [DISCONTINUED] predniSONE (STERAPRED UNI-PAK 21 TAB) 10 MG (21) TBPK tablet Use as directed for 6 days   No facility-administered encounter medications on file as of 06/17/2022.    Surgical History: Past Surgical History:  Procedure Laterality Date   BREAST BIOPSY Right 2005  negative   BREAST BIOPSY Left 08/23/2007   negative   BREAST BIOPSY Left 10/24/2013   positive   BREAST BIOPSY Left 07/2002   neg   BREAST BIOPSY Left 08/23/2007   neg   BREAST SURGERY Left 10/2011   Cyst Aspirationapocrine metaplasia, ductal cells and bone cells, hypo-cellular   BREAST SURGERY Left 2009   ADH on stereotactic biopsy, 1.5 mm focus.   BREAST SURGERY Left 2003   fibrocystic changes with ductal hyperplasia without atypia.   BREAST SURGERY Left    mastectomy   BREAST SURGERY Left April 11, 2014   Removal of implant, debridement chest wall [employer]   COLONOSCOPY  08/26/2013   Verdie Shire, M.D.  normal.   CRYOABLATION  2005, 2010   CYST REMOVAL NECK  10/2011   Dr. Tami Ribas   ESOPHAGOGASTRODUODENOSCOPY (EGD) WITH PROPOFOL N/A 01/16/2017   Procedure: ESOPHAGOGASTRODUODENOSCOPY (EGD) WITH PROPOFOL;  Surgeon: Lucilla Lame, MD;  Location: [facility];  Service: Gastroenterology;  Laterality: N/A;  Diabetic - oral meds   INCISION AND DRAINAGE Left Dec 2015, Feb 2016   Dr Tula Nakayama   MASTECTOMY Left 11/24/2013   positive   PORTACATH PLACEMENT  11/24/13    Medical History: Past Medical History:  Diagnosis Date   Arthritis 2006   Asthma    Breast cancer ([place]) 10/14/2013   18 mm, T1c, N0; ER/ PR positive,her 2 neu overexpressed. Adjuvant chemo/ herceptin.   Cancer [facility]) 2006   Renal cell carcinoma; cryosurgery treatment, right side   Cirrhosis ([place])    Collapsed lung 2007   Diabetes mellitus without complication ([place]) [id number]   Metformin   Diffuse cystic mastopathy    Family history of breast cancer    Family history of liver cancer    Motion sickness    any moving vehicle   Orthodontics    braces   Personal history of chemotherapy    Sinus problem     Family History: Family History  Problem Relation Age of Onset   Liver cancer Father    Hemochromatosis Father    Liver disease Father    Diabetes Mother    Hypertension Mother    Breast cancer Paternal Aunt 67   Ovarian cancer Maternal Grandmother        unk primary, metastatic cancer   Hemachromatosis Daughter    Liver cancer Paternal Uncle    Hemochromatosis Paternal Uncle     Social History   Socioeconomic History   Marital status: Married    Spouse name: Not on file   Number of children: Not on file   Years of education: Not on file   Highest education level: Not on file  Occupational History   Not on file  Tobacco Use   Smoking status: Never   Smokeless tobacco: Never  Vaping Use   Vaping Use: Never used  Substance and Sexual Activity   Alcohol use: Not Currently    Comment: occasionally, none  recently   Drug use: No   Sexual activity: Not on file  Other Topics Concern   Not on file  Social History Narrative   Not on file   Social Determinants of Health   Financial Resource Strain: Not on file  Food Insecurity: Not on file  Transportation Needs: Not on file  Physical Activity: Not on file  Stress: Not on file  Social Connections: Not on file  Intimate Partner Violence: Not on file  Review of Systems  Constitutional:  Positive for fatigue. Negative for fever.  HENT:  Negative for congestion, mouth sores and postnasal drip.        Difficulty hearing   Respiratory:  Negative for cough.   Cardiovascular:  Negative for chest pain.  Genitourinary:  Negative for flank pain.  Psychiatric/Behavioral: Negative.      Vital Signs: BP 127/76   Pulse 80   Temp 98.2 F (36.8 C)   Resp 16   Ht 5' 5"$  (1.651 m)   Wt 191 lb (86.6 kg)   SpO2 96%   BMI 31.78 kg/m    Physical Exam Constitutional:      Appearance: Normal appearance.  HENT:     Head: Normocephalic and atraumatic.     Nose: Nose normal.     Mouth/Throat:     Mouth: Mucous membranes are moist.     Pharynx: No posterior oropharyngeal erythema.  Eyes:     Extraocular Movements: Extraocular movements intact.     Pupils: Pupils are equal, round, and reactive to light.  Cardiovascular:     Pulses: Normal pulses.     Heart sounds: Normal heart sounds.  Pulmonary:     Effort: Pulmonary effort is normal.     Breath sounds: Normal breath sounds.  Neurological:     General: No focal deficit present.     Mental Status: She is alert.  Psychiatric:        Mood and Affect: Mood normal.        Behavior: Behavior normal.        Assessment/Plan: 1. Post viral debility Increase activity ad lib - DG Chest 2 View; Future  2. Bilateral hearing loss, unspecified hearing loss type Can be post viral, will refer to ENT - Ambulatory referral to ENT  3. Simple chronic bronchitis ([place]) Encouraged to  continue to use Breztri as before  - DG Chest 2 View; Future   General Counseling: latissha smolinski understanding of the findings of todays visit and agrees with plan of treatment. I have discussed any further diagnostic evaluation that may be needed or ordered today. We also reviewed her medications today. she has been encouraged to call the office with any questions or concerns that should arise related to todays visit.    Orders Placed This Encounter  Procedures   DG Chest 2 View   Ambulatory referral to ENT    No orders of the defined types were placed in this encounter.   Total time spent:35 Minutes Time spent includes review of chart, medications, test results, and follow up plan with the patient.    Controlled Substance Database was reviewed by me.   Dr Lavera Guise Internal medicine

## 2022-06-18 ENCOUNTER — Telehealth: Payer: Self-pay | Admitting: Internal Medicine

## 2022-06-18 NOTE — Telephone Encounter (Signed)
Awaiting 06/17/22 office notes for Otolaryngology referral-Toni

## 2022-06-18 NOTE — Progress Notes (Signed)
Normal CXR

## 2022-06-24 ENCOUNTER — Telehealth: Payer: Self-pay | Admitting: Nurse Practitioner

## 2022-06-24 NOTE — Telephone Encounter (Signed)
Otolaryngology referral sent via [facility] to [place]

## 2022-06-25 ENCOUNTER — Other Ambulatory Visit: Payer: Self-pay | Admitting: Nurse Practitioner

## 2022-06-25 DIAGNOSIS — M1990 Unspecified osteoarthritis, unspecified site: Secondary | ICD-10-CM

## 2022-06-25 NOTE — Telephone Encounter (Signed)
Please fix a direction you have to [facility] and its mailorder phar

## 2022-06-26 ENCOUNTER — Telehealth: Payer: Self-pay

## 2022-06-26 ENCOUNTER — Other Ambulatory Visit: Payer: Self-pay | Admitting: Internal Medicine

## 2022-06-26 ENCOUNTER — Other Ambulatory Visit: Payer: Self-pay

## 2022-06-26 DIAGNOSIS — F320 Major depressive disorder, single episode, mild: Secondary | ICD-10-CM

## 2022-06-26 DIAGNOSIS — K5909 Other constipation: Secondary | ICD-10-CM

## 2022-06-26 MED ORDER — DULOXETINE HCL 20 MG PO CPEP: 20.0000 mg | ORAL_CAPSULE | Freq: Every day | ORAL | 2 refills | Status: DC

## 2022-06-27 ENCOUNTER — Other Ambulatory Visit: Payer: Self-pay | Admitting: Nurse Practitioner

## 2022-06-27 DIAGNOSIS — G4733 Obstructive sleep apnea (adult) (pediatric): Secondary | ICD-10-CM | POA: Diagnosis not present

## 2022-06-27 DIAGNOSIS — M1990 Unspecified osteoarthritis, unspecified site: Secondary | ICD-10-CM

## 2022-06-27 MED ORDER — TRAMADOL HCL 50 MG PO TABS: 50.0000 mg | ORAL_TABLET | Freq: Two times a day (BID) | ORAL | 0 refills | Status: DC | PRN

## 2022-06-30 ENCOUNTER — Other Ambulatory Visit: Payer: Self-pay | Admitting: Internal Medicine

## 2022-06-30 DIAGNOSIS — K5909 Other constipation: Secondary | ICD-10-CM

## 2022-06-30 NOTE — Telephone Encounter (Signed)
Ok to send

## 2022-06-30 NOTE — Telephone Encounter (Signed)
Pt advised we send med to [facility]

## 2022-07-01 ENCOUNTER — Telehealth: Payer: Self-pay | Admitting: Nurse Practitioner

## 2022-07-01 NOTE — Telephone Encounter (Signed)
Otolaryngology appointment>> 07/21/22 with [facility]-Toni

## 2022-07-02 ENCOUNTER — Other Ambulatory Visit: Payer: Self-pay | Admitting: Internal Medicine

## 2022-07-02 DIAGNOSIS — M1712 Unilateral primary osteoarthritis, left knee: Secondary | ICD-10-CM | POA: Diagnosis not present

## 2022-07-02 DIAGNOSIS — S7002XA Contusion of left hip, initial encounter: Secondary | ICD-10-CM | POA: Diagnosis not present

## 2022-07-03 ENCOUNTER — Other Ambulatory Visit: Payer: Self-pay

## 2022-07-03 MED ORDER — PROPRANOLOL HCL ER 60 MG PO CP24: 60.0000 mg | ORAL_CAPSULE | Freq: Every day | ORAL | 1 refills | Status: DC

## 2022-07-07 ENCOUNTER — Ambulatory Visit (INDEPENDENT_AMBULATORY_CARE_PROVIDER_SITE_OTHER): Payer: BC Managed Care – PPO | Admitting: Internal Medicine

## 2022-07-07 VITALS — BP 108/74 | HR 80 | Resp 14 | Ht 66.0 in | Wt 188.0 lb

## 2022-07-07 DIAGNOSIS — G4733 Obstructive sleep apnea (adult) (pediatric): Secondary | ICD-10-CM | POA: Diagnosis not present

## 2022-07-07 DIAGNOSIS — G2581 Restless legs syndrome: Secondary | ICD-10-CM | POA: Diagnosis not present

## 2022-07-07 DIAGNOSIS — I10 Essential (primary) hypertension: Secondary | ICD-10-CM

## 2022-07-07 DIAGNOSIS — Z7189 Other specified counseling: Secondary | ICD-10-CM | POA: Diagnosis not present

## 2022-07-07 NOTE — Progress Notes (Signed)
[facility] [place], [place] [address]  Pulmonary Sleep Medicine   Office Visit Note  Patient Name: Danielle Wallace DOB: 1961/01/23 MRN [id number]    Chief Complaint: Obstructive Sleep Apnea visit  Brief History:  Danielle Wallace is seen today for a 4 month follow up on APAP at 4-16 cmh20. The patient has a 8 month history of sleep apnea. Patient is using PAP nightly.  The patient feels rested after sleeping with PAP.  The patient reports benefiting from PAP use. Reported sleepiness is  improved and the Epworth Sleepiness Score is 10 out of 24. The patient sometimes take naps, about 1-2 times per week. The patient complains of the following: Some mask leak recently she feels like the headgear is stretched out, she is due for a new mask next month.  The compliance download shows 73% compliance with an average use time of 5 hours 59 minutes. The AHI is 1.9.  The patient does complain of limb movements disrupting sleep.  ROS  General: (-) fever, (-) chills, (-) night sweat Nose and Sinuses: (-) nasal stuffiness or itchiness, (-) postnasal drip, (-) nosebleeds, (-) sinus trouble. Mouth and Throat: (-) sore throat, (-) hoarseness. Neck: (-) swollen glands, (-) enlarged thyroid, (-) neck pain. Respiratory: - cough, - shortness of breath, - wheezing. Neurologic: - numbness, - tingling. Psychiatric: - anxiety, - depression   Current Medication: Outpatient Encounter Medications as of 07/07/2022  Medication Sig   albuterol (VENTOLIN HFA) 108 (90 Base) MCG/ACT inhaler TAKE 2 PUFFS BY MOUTH EVERY 6 HOURS AS NEEDED FOR WHEEZE OR SHORTNESS OF BREATH   aspirin EC 81 MG tablet Take 1 tablet (81 mg total) by mouth daily. Swallow whole.   B COMPLEX-C PO Take by mouth daily.   Budeson-Glycopyrrol-Formoterol (BREZTRI AEROSPHERE) 160-9-4.8 MCG/ACT AERO Inhale 2 puffs into the lungs 2 (two) times daily. Pt needs 3 month supply   busPIRone (BUSPAR) 10 MG tablet Take 5 mg by mouth 2  (two) times daily.   diphenhydrAMINE (BENADRYL) 25 MG tablet Take 25 mg by mouth every 6 (six) hours as needed.   DULoxetine (CYMBALTA) 20 MG capsule Take 1 capsule (20 mg total) by mouth daily.   EPINEPHrine (EPIPEN 2-PAK) 0.3 mg/0.3 mL IJ SOAJ injection Inject 0.3 mLs (0.3 mg total) into the skin as needed.   fluticasone (FLONASE) 50 MCG/ACT nasal spray Place 1 spray into both nostrils daily.   furosemide (LASIX) 20 MG tablet Take 20 mg by mouth daily.   glucose blood (ONETOUCH VERIO) test strip Blood sugar testing done QD and as needed  E11.65   Insulin Pen Needle (BD PEN NEEDLE NANO 2ND GEN) 32G X 4 MM MISC Use as directed to inject [name] Daily into skin E11.65   lactulose (CHRONULAC) 10 GM/15ML solution TAKE 30 ML TWICE A DAY FOR CONSTIPATION AT A TIME   LINZESS 290 MCG CAPS capsule TAKE 1 CAPSULE BY MOUTH EVERY DAY BEFORE BREAKFAST   propranolol (INDERAL) 10 MG tablet Take 1 tablet (10 mg total) by mouth 3 (three) times daily. If bp is greater than 140   propranolol ER (INDERAL LA) 60 MG 24 hr capsule Take 1 capsule (60 mg total) by mouth daily.   Semaglutide, 1 MG/DOSE, (OZEMPIC, 1 MG/DOSE,) 4 MG/3ML SOPN INJECT 1 MG UNDER THE SKIN ONCE A WEEK   spironolactone (ALDACTONE) 25 MG tablet Take 1 tablet (25 mg total) by mouth 2 (two) times daily.   tamoxifen (NOLVADEX) 10 MG tablet TAKE 1 TABLET DAILY  traMADol (ULTRAM) 50 MG tablet Take 1 tablet (50 mg total) by mouth 2 (two) times daily as needed for moderate pain or severe pain.   TRESIBA FLEXTOUCH 100 UNIT/ML FlexTouch Pen INJECT 40 UNITS UNDER THE SKIN DAILY IN THE EVENING. RAISE DOSAGE BY 2 UNITS EVERY 3 DAYS UNTIL BLOOD SUGAR IS 130 OR BELOW   No facility-administered encounter medications on file as of 07/07/2022.    Surgical History: Past Surgical History:  Procedure Laterality Date   BREAST BIOPSY Right 2005   negative   BREAST BIOPSY Left 08/23/2007   negative   BREAST BIOPSY Left 10/24/2013   positive   BREAST BIOPSY Left  07/2002   neg   BREAST BIOPSY Left 08/23/2007   neg   BREAST SURGERY Left 10/2011   Cyst Aspirationapocrine metaplasia, ductal cells and bone cells, hypo-cellular   BREAST SURGERY Left 2009   ADH on stereotactic biopsy, 1.5 mm focus.   BREAST SURGERY Left 2003   fibrocystic changes with ductal hyperplasia without atypia.   BREAST SURGERY Left    mastectomy   BREAST SURGERY Left April 11, 2014   Removal of implant, debridement chest wall [place]   COLONOSCOPY  08/26/2013   Verdie Shire, M.D. normal.   CRYOABLATION  2005, 2010   CYST REMOVAL NECK  10/2011   Dr. Tami Ribas   ESOPHAGOGASTRODUODENOSCOPY (EGD) WITH PROPOFOL N/A 01/16/2017   Procedure: ESOPHAGOGASTRODUODENOSCOPY (EGD) WITH PROPOFOL;  Surgeon: Lucilla Lame, MD;  Location: [facility];  Service: Gastroenterology;  Laterality: N/A;  Diabetic - oral meds   INCISION AND DRAINAGE Left Dec 2015, Feb 2016   Dr Tula Nakayama   MASTECTOMY Left 11/24/2013   positive   PORTACATH PLACEMENT  11/24/13    Medical History: Past Medical History:  Diagnosis Date   Arthritis 2006   Asthma    Breast cancer ([place]) 10/14/2013   18 mm, T1c, N0; ER/ PR positive,her 2 neu overexpressed. Adjuvant chemo/ herceptin.   Cancer [facility]) 2006   Renal cell carcinoma; cryosurgery treatment, right side   Cirrhosis ([place])    Collapsed lung 2007   Diabetes mellitus without complication ([place]) [id number]   Metformin   Diffuse cystic mastopathy    Family history of breast cancer    Family history of liver cancer    Motion sickness    any moving vehicle   Orthodontics    braces   Personal history of chemotherapy    Sinus problem     Family History: Non contributory to the present illness  Social History: Social History   Socioeconomic History   Marital status: Married    Spouse name: Not on file   Number of children: Not on file   Years of education: Not on file   Highest education level: Not on file   Occupational History   Not on file  Tobacco Use   Smoking status: Never   Smokeless tobacco: Never  Vaping Use   Vaping Use: Never used  Substance and Sexual Activity   Alcohol use: Not Currently    Comment: occasionally, none recently   Drug use: No   Sexual activity: Not on file  Other Topics Concern   Not on file  Social History Narrative   Not on file   Social Determinants of Health   Financial Resource Strain: Not on file  Food Insecurity: Not on file  Transportation Needs: Not on file  Physical Activity: Not on file  Stress: Not  on file  Social Connections: Not on file  Intimate Partner Violence: Not on file    Vital Signs: Blood pressure 108/74, pulse 80, resp. rate 14, height '5\' 6"'$  (1.676 m), weight 188 lb (85.3 kg), SpO2 96 %. Body mass index is 30.34 kg/m.    Examination: General Appearance: The patient is well-developed, well-nourished, and in no distress. Neck Circumference: 36 cm Skin: Gross inspection of skin unremarkable. Head: normocephalic, no gross deformities. Eyes: no gross deformities noted. ENT: ears appear grossly normal Neurologic: Alert and oriented. No involuntary movements.  STOP BANG RISK ASSESSMENT S (snore) Have you been told that you snore?     NO   T (tired) Are you often tired, fatigued, or sleepy during the day?   NO  O (obstruction) Do you stop breathing, choke, or gasp during sleep? NO   P (pressure) Do you have or are you being treated for high blood pressure? YES   B (BMI) Is your body index greater than 35 kg/m? NO   A (age) Are you 42 years old or older? YES   N (neck) Do you have a neck circumference greater than 16 inches?   NO   G (gender) Are you a female? NO   TOTAL STOP/BANG "YES" ANSWERS 2       A STOP-Bang score of 2 or less is considered low risk, and a score of 5 or more is high risk for having either moderate or severe OSA. For people who score 3 or 4, doctors may need to perform further assessment to  determine how likely they are to have OSA.         EPWORTH SLEEPINESS SCALE:  Scale:  (0)= no chance of dozing; (1)= slight chance of dozing; (2)= moderate chance of dozing; (3)= high chance of dozing  Chance  Situtation    Sitting and reading: 2    Watching TV: 1    Sitting Inactive in public: 0    As a passenger in car: 2      Lying down to rest: 3    Sitting and talking: 0    Sitting quielty after lunch: 2    In a car, stopped in traffic: 0   TOTAL SCORE:   10 out of 24    SLEEP STUDIES:  PSG (10/29/21)  AHI 8.2, min SPO2 69%   CPAP COMPLIANCE DATA:  Date Range: 03/03/22 - 06/30/22  Average Daily Use: 5 hours 59 minutes  Median Use: 7 hours 58 minutes  Compliance for > 4 Hours: 87 days  AHI: 1.9 respiratory events per hour  Days Used: 93/120  Mask Leak: 5.3  95th Percentile Pressure: 9.4 cmh20         LABS: Recent Results (from the past 2160 hour(s))  POCT HgB A1C     Status: Abnormal   Collection Time: 05/26/22  9:33 AM  Result Value Ref Range   Hemoglobin A1C 6.3 (A) 4.0 - 5.6 %   HbA1c POC (<> result, manual entry)     HbA1c, POC (prediabetic range)     HbA1c, POC (controlled diabetic range)      Radiology: DG Chest 2 View  Result Date: 06/17/2022 CLINICAL DATA:  Post viral debility. Shortness of breath. Patient reports chest tightness and difficulty breathing for 1 week. History of asthma. EXAM: CHEST - 2 VIEW COMPARISON:  Radiograph 07/19/2021. Chest CT 02/26/2022 FINDINGS: The heart is normal in size. The cardiomediastinal contours are normal. Pulmonary vasculature is normal. No consolidation, pleural effusion,  or pneumothorax. No acute osseous abnormalities are seen. IMPRESSION: No acute chest findings or explanation for shortness of breath. Electronically Signed   By: Keith Rake M.D.   On: 06/17/2022 23:50    No results found.  DG Chest 2 View  Result Date: 06/17/2022 CLINICAL DATA:  Post viral debility. Shortness of  breath. Patient reports chest tightness and difficulty breathing for 1 week. History of asthma. EXAM: CHEST - 2 VIEW COMPARISON:  Radiograph 07/19/2021. Chest CT 02/26/2022 FINDINGS: The heart is normal in size. The cardiomediastinal contours are normal. Pulmonary vasculature is normal. No consolidation, pleural effusion, or pneumothorax. No acute osseous abnormalities are seen. IMPRESSION: No acute chest findings or explanation for shortness of breath. Electronically Signed   By: Keith Rake M.D.   On: 06/17/2022 23:50      Assessment and Plan: Patient Active Problem List   Diagnosis Date Noted   OSA on CPAP 03/03/2022   CPAP use counseling 03/03/2022   Complex tear of medial meniscus of right knee as current injury 03/04/2020   Aortic atherosclerosis ([place]) 03/04/2020   Osteoarthritis of knee 03/02/2020   Encounter for general adult medical examination with abnormal findings 01/31/2020   Primary osteoarthritis of both knees 01/31/2020   Degenerative disc disease, lumbar 01/31/2020   Genetic testing 11/16/2019   Family history of breast cancer    Family history of liver cancer    Multinodular goiter 10/10/2019   Thyromegaly 09/19/2019   Allergic reaction 07/01/2019   Breast cancer of upper-outer quadrant of left female breast ([place]) 10/27/2018   Routine cervical smear 10/19/2018   Uncontrolled type 2 diabetes mellitus with hyperglycemia ([place]) 10/19/2018   Gastroesophageal reflux disease with esophagitis 10/19/2018   Stomatitis and mucositis 10/19/2018   Urinary tract infection without hematuria 10/19/2018   Dysuria 10/19/2018   Need for vaccination against Streptococcus pneumoniae using pneumococcal conjugate vaccine 13 04/21/2018   Acute bronchitis 10/01/2017   Wheezing 10/01/2017   Sore throat 10/01/2017   Fatigue 09/23/2017   Vitamin D deficiency 09/23/2017   Anemia 09/23/2017   Acute non-recurrent pansinusitis 08/12/2017   Type 2 diabetes mellitus with hyperglycemia,  without long-term current use of insulin ([place]) 08/12/2017   Epstein Barr virus infection 08/12/2017   Essential hypertension 05/18/2017   Abnormal CT scan, stomach    Gastric varices    Cellulitis of female breast 12/11/2016   Open wound of breast 12/11/2016   Abdominal distention 01/23/2016   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 12/12/2014   Nausea 09/22/2014   EN (erythema nodosum) 07/26/2014   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   Personal history of renal cancer 05/23/2014   Personal history of other malignant neoplasm of kidney 05/23/2014   Right flank pain 05/23/2014   Carcinoma of upper-outer quadrant of left breast in female, estrogen receptor positive ([place]) 10/14/2013   Calcium blood increased 06/25/2012   Cancer of kidney ([place]) 06/25/2012   Malignant neoplasm of kidney ([place]) 06/25/2012   Female stress incontinence 06/25/2012   Diabetes mellitus without complication ([place]) [id number]   1. OSA on CPAP The patient does tolerate PAP and reports  benefit from PAP use. The patient was reminded how to clean equipment and advised to replace supplies routinely. The patient was also counselled on weight loss. The compliance is good. The AHI is 1.9.   OSA on cpap- controlled. Continue with good compliance with pap. CPAP continues to be medically necessary to treat this patient's OSA. F/u one year.     2. CPAP use counseling CPAP Counseling:  had a lengthy discussion with the patient regarding the importance of PAP therapy in management of the sleep apnea. Patient appears to understand the risk factor reduction and also understands the risks associated with untreated sleep apnea. Patient will try to make a good faith effort to remain compliant with therapy. Also instructed the patient on proper cleaning of the device including the water must be changed daily if possible and use of distilled water is preferred. Patient understands that the machine should be regularly cleaned with appropriate  recommended cleaning solutions that do not damage the PAP machine for example given white vinegar and water rinses. Other methods such as ozone treatment may not be as good as these simple methods to achieve cleaning.   3. Essential hypertension Hypertension Counseling:   The following hypertensive lifestyle modification were recommended and discussed:  1. Limiting alcohol intake to less than 1 oz/day of ethanol:(24 oz of beer or 8 oz of wine or 2 oz of 100-proof whiskey). 2. Take baby ASA 81 mg daily. 3. Importance of regular aerobic exercise and losing weight. 4. Reduce dietary saturated fat and cholesterol intake for overall cardiovascular health. 5. Maintaining adequate dietary potassium, calcium, and magnesium intake. 6. Regular monitoring of the blood pressure. 7. Reduce sodium intake to less than 100 mmol/day (less than 2.3 gm of sodium or less than 6 gm of sodium choride)    4. Restless leg syndrome We discussed nonpharmacologic methods for control of symptoms. She will avoid caffeine. She is on cymbalta and occasionally takes benadryl which I advised her to avoid. She has just started a magnesium supplement. If her symptoms persist, gabapentin could be tried- she tolerated that well in the past.      General Counseling: I have discussed the findings of the evaluation and examination with Danielle Wallace.  I have also discussed any further diagnostic evaluation thatmay be needed or ordered today. Danielle Wallace verbalizes understanding of the findings of todays visit. We also reviewed her medications today and discussed drug interactions and side effects including but not limited excessive drowsiness and altered mental states. We also discussed that there is always a risk not just to her but also people around her. she has been encouraged to call the office with any questions or concerns that should arise related to todays visit.  No orders of the defined types were placed in this encounter.        I have personally obtained a history, examined the patient, evaluated laboratory and imaging results, formulated the assessment and plan and placed orders. This patient was seen today by Tressie Ellis, PA-C in collaboration with Dr. Devona Konig.   Allyne Gee, MD [facility] Diplomate ABMS Pulmonary Critical Care Medicine and Sleep Medicine

## 2022-07-07 NOTE — Patient Instructions (Signed)

## 2022-07-21 DIAGNOSIS — H903 Sensorineural hearing loss, bilateral: Secondary | ICD-10-CM | POA: Diagnosis not present

## 2022-07-26 DIAGNOSIS — G4733 Obstructive sleep apnea (adult) (pediatric): Secondary | ICD-10-CM | POA: Diagnosis not present

## 2022-08-11 DIAGNOSIS — Z148 Genetic carrier of other disease: Secondary | ICD-10-CM | POA: Diagnosis not present

## 2022-08-11 DIAGNOSIS — R932 Abnormal findings on diagnostic imaging of liver and biliary tract: Secondary | ICD-10-CM | POA: Diagnosis not present

## 2022-08-11 DIAGNOSIS — M1991 Primary osteoarthritis, unspecified site: Secondary | ICD-10-CM | POA: Diagnosis not present

## 2022-08-11 DIAGNOSIS — Z923 Personal history of irradiation: Secondary | ICD-10-CM | POA: Diagnosis not present

## 2022-08-11 DIAGNOSIS — K7581 Nonalcoholic steatohepatitis (NASH): Secondary | ICD-10-CM | POA: Diagnosis not present

## 2022-08-11 DIAGNOSIS — E669 Obesity, unspecified: Secondary | ICD-10-CM | POA: Diagnosis not present

## 2022-08-11 DIAGNOSIS — K7689 Other specified diseases of liver: Secondary | ICD-10-CM | POA: Diagnosis not present

## 2022-08-11 DIAGNOSIS — I864 Gastric varices: Secondary | ICD-10-CM | POA: Diagnosis not present

## 2022-08-11 DIAGNOSIS — I10 Essential (primary) hypertension: Secondary | ICD-10-CM | POA: Diagnosis not present

## 2022-08-11 DIAGNOSIS — E119 Type 2 diabetes mellitus without complications: Secondary | ICD-10-CM | POA: Diagnosis not present

## 2022-08-11 DIAGNOSIS — R252 Cramp and spasm: Secondary | ICD-10-CM | POA: Diagnosis not present

## 2022-08-11 DIAGNOSIS — Z23 Encounter for immunization: Secondary | ICD-10-CM | POA: Diagnosis not present

## 2022-08-11 DIAGNOSIS — Z9012 Acquired absence of left breast and nipple: Secondary | ICD-10-CM | POA: Diagnosis not present

## 2022-08-11 DIAGNOSIS — Z6831 Body mass index (BMI) 31.0-31.9, adult: Secondary | ICD-10-CM | POA: Diagnosis not present

## 2022-08-11 DIAGNOSIS — E441 Mild protein-calorie malnutrition: Secondary | ICD-10-CM | POA: Diagnosis not present

## 2022-08-11 DIAGNOSIS — K7460 Unspecified cirrhosis of liver: Secondary | ICD-10-CM | POA: Diagnosis not present

## 2022-08-18 DIAGNOSIS — Z7982 Long term (current) use of aspirin: Secondary | ICD-10-CM | POA: Diagnosis not present

## 2022-08-18 DIAGNOSIS — E119 Type 2 diabetes mellitus without complications: Secondary | ICD-10-CM | POA: Diagnosis not present

## 2022-08-18 DIAGNOSIS — Z9049 Acquired absence of other specified parts of digestive tract: Secondary | ICD-10-CM | POA: Diagnosis not present

## 2022-08-18 DIAGNOSIS — M130 Polyarthritis, unspecified: Secondary | ICD-10-CM | POA: Diagnosis not present

## 2022-08-18 DIAGNOSIS — Z882 Allergy status to sulfonamides status: Secondary | ICD-10-CM | POA: Diagnosis not present

## 2022-08-18 DIAGNOSIS — Z794 Long term (current) use of insulin: Secondary | ICD-10-CM | POA: Diagnosis not present

## 2022-08-18 DIAGNOSIS — Z880 Allergy status to penicillin: Secondary | ICD-10-CM | POA: Diagnosis not present

## 2022-08-25 ENCOUNTER — Ambulatory Visit
Admission: RE | Admit: 2022-08-25 | Discharge: 2022-08-25 | Disposition: A | Discharge status: Home / Self Care | Payer: BC Managed Care – PPO | Attending: Pain Medicine | Admitting: Pain Medicine

## 2022-08-25 ENCOUNTER — Other Ambulatory Visit: Payer: Self-pay | Admitting: Anesthesiology

## 2022-08-25 ENCOUNTER — Ambulatory Visit
Admission: RE | Admit: 2022-08-25 | Discharge: 2022-08-25 | Disposition: A | Discharge status: Home / Self Care | Payer: BC Managed Care – PPO | Source: Ambulatory Visit | Attending: Pain Medicine | Admitting: Pain Medicine

## 2022-08-25 DIAGNOSIS — M5134 Other intervertebral disc degeneration, thoracic region: Secondary | ICD-10-CM | POA: Diagnosis not present

## 2022-08-25 DIAGNOSIS — M546 Pain in thoracic spine: Secondary | ICD-10-CM

## 2022-08-26 ENCOUNTER — Ambulatory Visit: Payer: BC Managed Care – PPO | Admitting: Nurse Practitioner

## 2022-08-26 ENCOUNTER — Other Ambulatory Visit: Payer: Self-pay | Admitting: Pain Medicine

## 2022-08-26 ENCOUNTER — Ambulatory Visit
Admission: RE | Admit: 2022-08-26 | Discharge: 2022-08-26 | Disposition: A | Discharge status: Home / Self Care | Payer: BC Managed Care – PPO | Source: Ambulatory Visit | Attending: Pain Medicine | Admitting: Pain Medicine

## 2022-08-26 ENCOUNTER — Encounter: Payer: Self-pay | Admitting: Nurse Practitioner

## 2022-08-26 VITALS — BP 132/69 | HR 87 | Temp 97.4°F | Resp 16 | Ht 66.0 in | Wt 189.0 lb

## 2022-08-26 DIAGNOSIS — M130 Polyarthritis, unspecified: Secondary | ICD-10-CM

## 2022-08-26 DIAGNOSIS — M545 Low back pain, unspecified: Secondary | ICD-10-CM | POA: Diagnosis not present

## 2022-08-26 DIAGNOSIS — G4733 Obstructive sleep apnea (adult) (pediatric): Secondary | ICD-10-CM | POA: Diagnosis not present

## 2022-08-26 DIAGNOSIS — K7460 Unspecified cirrhosis of liver: Secondary | ICD-10-CM | POA: Diagnosis not present

## 2022-08-26 DIAGNOSIS — E1165 Type 2 diabetes mellitus with hyperglycemia: Secondary | ICD-10-CM | POA: Diagnosis not present

## 2022-08-26 DIAGNOSIS — R102 Pelvic and perineal pain: Secondary | ICD-10-CM | POA: Diagnosis not present

## 2022-08-26 DIAGNOSIS — I10 Essential (primary) hypertension: Secondary | ICD-10-CM | POA: Diagnosis not present

## 2022-08-26 DIAGNOSIS — Z794 Long term (current) use of insulin: Secondary | ICD-10-CM | POA: Diagnosis not present

## 2022-08-26 DIAGNOSIS — Z23 Encounter for immunization: Secondary | ICD-10-CM | POA: Diagnosis not present

## 2022-08-26 DIAGNOSIS — M1811 Unilateral primary osteoarthritis of first carpometacarpal joint, right hand: Secondary | ICD-10-CM | POA: Diagnosis not present

## 2022-08-26 DIAGNOSIS — K769 Liver disease, unspecified: Secondary | ICD-10-CM

## 2022-08-26 DIAGNOSIS — M1812 Unilateral primary osteoarthritis of first carpometacarpal joint, left hand: Secondary | ICD-10-CM | POA: Diagnosis not present

## 2022-08-26 LAB — POCT GLYCOSYLATED HEMOGLOBIN (HGB A1C): Hemoglobin A1C: 6.1 % — AB (ref 4.0–5.6)

## 2022-08-26 MED ORDER — TETANUS-DIPHTH-ACELL PERTUSSIS 5-2.5-18.5 LF-MCG/0.5 IM SUSP
0.5000 mL | Freq: Once | INTRAMUSCULAR | 0 refills | Status: AC
Start: 2022-08-26 — End: 2022-08-26

## 2022-08-26 NOTE — Progress Notes (Signed)
[facility] [address] [place], [place] [address]  Internal MEDICINE  Office Visit Note  Patient Name: Danielle Wallace  [id number]  [id number]  Date of Service: 08/26/2022  Chief Complaint  Patient presents with   Diabetes   Follow-up    HPI Danielle Wallace presents for a follow-up visit for diabetes, cirrhosis, arthritis,  Diabetes -- A1c improved to 6.1, no significant changes, no interventions at this time.  Pain management  -- having imaging and testing done, ANA positive. Going to [facility]. Also going to chiropractor.  Liver spceicialist still following, having upper endoscopy coming up and recent abd ultrasound.  Due for tetanus booster     Current Medication: Outpatient Encounter Medications as of 08/26/2022  Medication Sig   albuterol (VENTOLIN HFA) 108 (90 Base) MCG/ACT inhaler TAKE 2 PUFFS BY MOUTH EVERY 6 HOURS AS NEEDED FOR WHEEZE OR SHORTNESS OF BREATH   aspirin EC 81 MG tablet Take 1 tablet (81 mg total) by mouth daily. Swallow whole.   B COMPLEX-C PO Take by mouth daily.   Budeson-Glycopyrrol-Formoterol (BREZTRI AEROSPHERE) 160-9-4.8 MCG/ACT AERO Inhale 2 puffs into the lungs 2 (two) times daily. Pt needs 3 month supply   busPIRone (BUSPAR) 10 MG tablet Take 5 mg by mouth 2 (two) times daily.   diphenhydrAMINE (BENADRYL) 25 MG tablet Take 25 mg by mouth every 6 (six) hours as needed.   DULoxetine (CYMBALTA) 20 MG capsule Take 1 capsule (20 mg total) by mouth daily.   EPINEPHrine (EPIPEN 2-PAK) 0.3 mg/0.3 mL IJ SOAJ injection Inject 0.3 mLs (0.3 mg total) into the skin as needed.   fluticasone (FLONASE) 50 MCG/ACT nasal spray Place 1 spray into both nostrils daily.   furosemide (LASIX) 20 MG tablet Take 20 mg by mouth daily.   glucose blood (ONETOUCH VERIO) test strip Blood sugar testing done QD and as needed  E11.65   Insulin Pen Needle (BD PEN NEEDLE NANO 2ND GEN) 32G X 4 MM MISC Use as directed to inject [place] Daily into skin E11.65   lactulose (CHRONULAC)  10 GM/15ML solution TAKE 30 ML TWICE A DAY FOR CONSTIPATION AT A TIME   LINZESS 290 MCG CAPS capsule TAKE 1 CAPSULE BY MOUTH EVERY DAY BEFORE BREAKFAST   propranolol (INDERAL) 10 MG tablet Take 1 tablet (10 mg total) by mouth 3 (three) times daily. If bp is greater than 140   propranolol ER (INDERAL LA) 60 MG 24 hr capsule Take 1 capsule (60 mg total) by mouth daily.   Semaglutide, 1 MG/DOSE, (OZEMPIC, 1 MG/DOSE,) 4 MG/3ML SOPN INJECT 1 MG UNDER THE SKIN ONCE A WEEK   spironolactone (ALDACTONE) 25 MG tablet Take 1 tablet (25 mg total) by mouth 2 (two) times daily.   tamoxifen (NOLVADEX) 10 MG tablet TAKE 1 TABLET DAILY   Tdap (BOOSTRIX) 5-2.5-18.5 LF-MCG/0.5 injection Inject 0.5 mLs into the muscle once.   traMADol (ULTRAM) 50 MG tablet Take 1 tablet (50 mg total) by mouth 2 (two) times daily as needed for moderate pain or severe pain.   TRESIBA FLEXTOUCH 100 UNIT/ML FlexTouch Pen INJECT 40 UNITS UNDER THE SKIN DAILY IN THE EVENING. RAISE DOSAGE BY 2 UNITS EVERY 3 DAYS UNTIL BLOOD SUGAR IS 130 OR BELOW   No facility-administered encounter medications on file as of 08/26/2022.    Surgical History: Past Surgical History:  Procedure Laterality Date   BREAST BIOPSY Right 2005   negative   BREAST BIOPSY Left 08/23/2007   negative   BREAST BIOPSY Left 10/24/2013  positive   BREAST BIOPSY Left 07/2002   neg   BREAST BIOPSY Left 08/23/2007   neg   BREAST SURGERY Left 10/2011   Cyst Aspirationapocrine metaplasia, ductal cells and bone cells, hypo-cellular   BREAST SURGERY Left 2009   ADH on stereotactic biopsy, 1.5 mm focus.   BREAST SURGERY Left 2003   fibrocystic changes with ductal hyperplasia without atypia.   BREAST SURGERY Left    mastectomy   BREAST SURGERY Left April 11, 2014   Removal of implant, debridement chest wall Dr.Coan   CESAREAN SECTION  1991   CHOLECYSTECTOMY  1990   COLONOSCOPY  08/26/2013   Lutricia Feil, M.D. normal.   CRYOABLATION  2005, 2010   CYST REMOVAL NECK  10/2011    Dr. Jenne Campus   ESOPHAGOGASTRODUODENOSCOPY (EGD) WITH PROPOFOL N/A 01/16/2017   Procedure: ESOPHAGOGASTRODUODENOSCOPY (EGD) WITH PROPOFOL;  Surgeon: Midge Minium, MD;  Location: [facility];  Service: Gastroenterology;  Laterality: N/A;  Diabetic - oral meds   INCISION AND DRAINAGE Left Dec 2015, Feb 2016   Dr Meriam Sprague   MASTECTOMY Left 11/24/2013   positive   PORTACATH PLACEMENT  11/24/13    Medical History: Past Medical History:  Diagnosis Date   Arthritis 2006   Asthma    Breast cancer 10/14/2013   18 mm, T1c, N0; ER/ PR positive,her 2 neu overexpressed. Adjuvant chemo/ herceptin.   Cancer 2006   Renal cell carcinoma; cryosurgery treatment, right side   Cirrhosis    Collapsed lung 2007   Diabetes mellitus without complication 2006   Metformin   Diffuse cystic mastopathy    Family history of breast cancer    Family history of liver cancer    Motion sickness    any moving vehicle   Orthodontics    braces   Personal history of chemotherapy    Sinus problem     Family History: Family History  Problem Relation Age of Onset   Liver cancer Father    Hemochromatosis Father    Liver disease Father    Diabetes Mother    Hypertension Mother    Breast cancer Paternal Aunt 41   Ovarian cancer Maternal Grandmother        unk primary, metastatic cancer   Hemachromatosis Daughter    Liver cancer Paternal Uncle    Hemochromatosis Paternal Uncle     Social History   Socioeconomic History   Marital status: Married    Spouse name: Not on file   Number of children: Not on file   Years of education: Not on file   Highest education level: Not on file  Occupational History   Not on file  Tobacco Use   Smoking status: Never   Smokeless tobacco: Never  Vaping Use   Vaping Use: Never used  Substance and Sexual Activity   Alcohol use: Not Currently    Comment: occasionally, none recently   Drug use: No   Sexual activity: Not on file  Other Topics Concern   Not on file   Social History Narrative   Not on file   Social Determinants of Health   Financial Resource Strain: Not on file  Food Insecurity: Not on file  Transportation Needs: Not on file  Physical Activity: Not on file  Stress: Not on file  Social Connections: Not on file  Intimate Partner Violence: Not on file      Review of Systems  Constitutional:  Positive for fatigue and unexpected weight change. Negative for fever.  HENT:  Negative  for congestion, mouth sores and postnasal drip.        Difficulty hearing   Respiratory: Negative.  Negative for cough, chest tightness, shortness of breath and wheezing.   Cardiovascular: Negative.  Negative for chest pain and palpitations.  Gastrointestinal:  Positive for abdominal distention and abdominal pain.  Genitourinary:  Negative for flank pain.  Musculoskeletal:  Positive for arthralgias and back pain.  Psychiatric/Behavioral: Negative.      Vital Signs: BP 132/69   Pulse 87   Temp (!) 97.4 F (36.3 C)   Resp 16   Ht 5\' 6"  (1.676 m)   Wt 189 lb (85.7 kg)   SpO2 97%   BMI 30.51 kg/m    Physical Exam Vitals reviewed.  Constitutional:      General: She is not in acute distress.    Appearance: Normal appearance. She is obese. She is not ill-appearing.  HENT:     Head: Normocephalic and atraumatic.  Eyes:     Pupils: Pupils are equal, round, and reactive to light.  Cardiovascular:     Rate and Rhythm: Normal rate and regular rhythm.  Pulmonary:     Effort: Pulmonary effort is normal. No respiratory distress.  Abdominal:     General: There is distension.  Neurological:     Mental Status: She is alert and oriented to person, place, and time.  Psychiatric:        Mood and Affect: Mood normal.        Behavior: Behavior normal.        Assessment/Plan: 1. Type 2 diabetes mellitus with hyperglycemia, with long-term current use of insulin (HCC) Stable, continue medications as prescribed.  - POCT glycosylated hemoglobin (Hb  A1C)  2. Chronic liver disease and cirrhosis (HCC) Followed by hepatologist at [facility]. No significant changes, has upper endoscopy upcoming and recent abd ultrasound has been done.   3. Essential hypertension Stable, continue current medications as prescribed   4. Need for vaccination - Tdap (BOOSTRIX) 5-2.5-18.5 LF-MCG/0.5 injection; Inject 0.5 mLs into the muscle once for 1 dose.  Dispense: 0.5 mL; Refill: 0   General Counseling: Najia verbalizes understanding of the findings of todays visit and agrees with plan of treatment. I have discussed any further diagnostic evaluation that may be needed or ordered today. We also reviewed her medications today. she has been encouraged to call the office with any questions or concerns that should arise related to todays visit.    Orders Placed This Encounter  Procedures   POCT glycosylated hemoglobin (Hb A1C)    No orders of the defined types were placed in this encounter.   Return in about 6 months (around 02/25/2023) for CPE, Jonta Gastineau PCP, Recheck A1C.   Total time spent:30 Minutes Time spent includes review of chart, medications, test results, and follow up plan with the patient.   [place] Controlled Substance Database was reviewed by me.  This patient was seen by Sallyanne Kuster, FNP-C in collaboration with Dr. Beverely Risen as a part of collaborative care agreement.   Mattalyn Anderegg R. Tedd Sias, MSN, FNP-C Internal medicine

## 2022-09-03 ENCOUNTER — Encounter: Payer: Self-pay | Admitting: Internal Medicine

## 2022-09-03 ENCOUNTER — Inpatient Hospital Stay: Payer: BC Managed Care – PPO | Attending: Internal Medicine

## 2022-09-03 ENCOUNTER — Inpatient Hospital Stay (HOSPITAL_BASED_OUTPATIENT_CLINIC_OR_DEPARTMENT_OTHER): Payer: BC Managed Care – PPO | Admitting: Internal Medicine

## 2022-09-03 VITALS — BP 130/68 | HR 80 | Temp 96.7°F | Ht 66.0 in | Wt 190.6 lb

## 2022-09-03 DIAGNOSIS — Z800 Family history of malignant neoplasm of digestive organs: Secondary | ICD-10-CM | POA: Insufficient documentation

## 2022-09-03 DIAGNOSIS — Z170 Estrogen receptor positive status [ER+]: Secondary | ICD-10-CM | POA: Diagnosis not present

## 2022-09-03 DIAGNOSIS — C50412 Malignant neoplasm of upper-outer quadrant of left female breast: Secondary | ICD-10-CM | POA: Insufficient documentation

## 2022-09-03 DIAGNOSIS — Z9012 Acquired absence of left breast and nipple: Secondary | ICD-10-CM | POA: Insufficient documentation

## 2022-09-03 DIAGNOSIS — Z803 Family history of malignant neoplasm of breast: Secondary | ICD-10-CM | POA: Diagnosis not present

## 2022-09-03 DIAGNOSIS — Z79810 Long term (current) use of selective estrogen receptor modulators (SERMs): Secondary | ICD-10-CM | POA: Diagnosis not present

## 2022-09-03 DIAGNOSIS — K7460 Unspecified cirrhosis of liver: Secondary | ICD-10-CM | POA: Diagnosis not present

## 2022-09-03 DIAGNOSIS — G4733 Obstructive sleep apnea (adult) (pediatric): Secondary | ICD-10-CM | POA: Insufficient documentation

## 2022-09-03 DIAGNOSIS — E042 Nontoxic multinodular goiter: Secondary | ICD-10-CM | POA: Diagnosis not present

## 2022-09-03 DIAGNOSIS — Z85528 Personal history of other malignant neoplasm of kidney: Secondary | ICD-10-CM | POA: Diagnosis not present

## 2022-09-03 DIAGNOSIS — Z8041 Family history of malignant neoplasm of ovary: Secondary | ICD-10-CM | POA: Diagnosis not present

## 2022-09-03 LAB — CBC WITH DIFFERENTIAL/PLATELET
Abs Immature Granulocytes: 0.04 10*3/uL (ref 0.00–0.07)
Basophils Absolute: 0 10*3/uL (ref 0.0–0.1)
Basophils Relative: 0 %
Eosinophils Absolute: 0.1 10*3/uL (ref 0.0–0.5)
Eosinophils Relative: 1 %
HCT: 42.1 % (ref 36.0–46.0)
Hemoglobin: 14.2 g/dL (ref 12.0–15.0)
Immature Granulocytes: 1 %
Lymphocytes Relative: 31 %
Lymphs Abs: 2.5 10*3/uL (ref 0.7–4.0)
MCH: 31.6 pg (ref 26.0–34.0)
MCHC: 33.7 g/dL (ref 30.0–36.0)
MCV: 93.8 fL (ref 80.0–100.0)
Monocytes Absolute: 0.7 10*3/uL (ref 0.1–1.0)
Monocytes Relative: 9 %
Neutro Abs: 4.8 10*3/uL (ref 1.7–7.7)
Neutrophils Relative %: 58 %
Platelets: 230 10*3/uL (ref 150–400)
RBC: 4.49 MIL/uL (ref 3.87–5.11)
RDW: 11.9 % (ref 11.5–15.5)
WBC: 8.1 10*3/uL (ref 4.0–10.5)
nRBC: 0 % (ref 0.0–0.2)

## 2022-09-03 LAB — COMPREHENSIVE METABOLIC PANEL
ALT: 23 U/L (ref 0–44)
AST: 27 U/L (ref 15–41)
Albumin: 3.6 g/dL (ref 3.5–5.0)
Alkaline Phosphatase: 77 U/L (ref 38–126)
Anion gap: 6 (ref 5–15)
BUN: 20 mg/dL (ref 8–23)
CO2: 28 mmol/L (ref 22–32)
Calcium: 8.8 mg/dL — ABNORMAL LOW (ref 8.9–10.3)
Chloride: 102 mmol/L (ref 98–111)
Creatinine, Ser: 0.71 mg/dL (ref 0.44–1.00)
GFR, Estimated: >60 mL/min (ref >60)
Glucose, Bld: 128 mg/dL — ABNORMAL HIGH (ref 70–99)
Potassium: 4.1 mmol/L (ref 3.5–5.1)
Sodium: 136 mmol/L (ref 135–145)
Total Bilirubin: 0.5 mg/dL (ref 0.3–1.2)
Total Protein: 7.4 g/dL (ref 6.5–8.1)

## 2022-09-03 NOTE — Progress Notes (Signed)
C/o chest pain and sob today. Pt states she is nervous about today's visit.

## 2022-09-03 NOTE — Progress Notes (Signed)
[facility] OFFICE PROGRESS NOTE  Patient Care Team: Sallyanne Kuster, NP as PCP - General (Nurse Practitioner) Debbe Odea, MD as PCP - Cardiology (Cardiology) Lemar Livings, Merrily Pew, MD (General Surgery) Johney Maine, MD (Oncology) Earna Coder, MD as Consulting Physician (Hematology and Oncology)   Cancer Staging  Carcinoma of upper-outer quadrant of left breast in female, estrogen receptor positive Staging form: Breast, AJCC 7th Edition - Clinical: No stage assigned - Unsigned    Oncology History Overview Note  # July 2016- 1.carcinoma of breast(left) T1 C. N0 M0 status post ultrasound-guided biopsy Estrogen receptor positive. Progesterone receptor positive.  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 chemotherapy on hold since November because of cellulitis in the chest wall. 4.started on Herceptin May 22, 2014.  Cause of recurrent chest wall  infection chemotherapy was put on hold. 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  Patient is on multiple antibiotic and as per infectious disease specialist similar to stay on antibiotic for 4 months. So chemotherapy has been discontinued. Maintenance Herceptin as well as patient was started on letrozole from June 12, 2014 6.  Port was removed because of Pseudomonas infection of the pocket (August, 2016) 7.  Patient is finishing up Herceptin in August of 2016 now on letrozole;   # FEB 2017- TAMOXIFEN [poor tolerance to AI]   # Jan 2019- [facility]- intermediate risk- recommend EXTENDED TAM  # Cirrhosis/ ? 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  Ambulating independently.   Danielle Wallace 62 y.o.  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  Patient complains of extreme musculoskeletal symptoms of worsening joint pain/myalgias compound by fatigue over the last many months.  Patient had been evaluated by pain clinic at [facility].  Also referred/evaluation with [facility] rheumatology.  She states that she feels extremely tired by the end of the day.  Unfortunately she is quite frustrated with the fatigue and also the musculoskeletal symptoms or joint pains.  Her symptoms of shortness of breath improved not resolved.  She is currently on CPAP.  Patient denies any nausea vomiting headaches.  Denies any chest pain.    Review of Systems  Constitutional:  Positive for malaise/fatigue. Negative for chills, diaphoresis, fever and weight loss.  HENT:  Negative for nosebleeds and sore throat.   Eyes:  Negative for double vision.  Respiratory:  Negative for cough, hemoptysis, sputum production, shortness of breath and wheezing.   Cardiovascular:  Negative for chest pain, palpitations, orthopnea and leg swelling.  Gastrointestinal:  Negative for abdominal pain, blood in stool, constipation, diarrhea, heartburn, melena, nausea and vomiting.  Genitourinary:  Negative for dysuria, frequency and urgency.  Musculoskeletal:  Positive for joint pain and myalgias.  Skin: Negative.  Negative for itching  and rash.  Neurological:  Negative for dizziness, tingling, focal weakness, weakness and headaches.  Endo/Heme/Allergies:  Does not bruise/bleed  easily.  Psychiatric/Behavioral:  Negative for depression. The patient is not nervous/anxious and does not have insomnia.      PAST MEDICAL HISTORY :  Past Medical History:  Diagnosis Date   Arthritis 2006   Asthma    Breast cancer 10/14/2013   18 mm, T1c, N0; ER/ PR positive,her 2 neu overexpressed. Adjuvant chemo/ herceptin.   Cancer 2006   Renal cell carcinoma; cryosurgery treatment, right side   Cirrhosis    Collapsed lung 2007   Diabetes mellitus without complication 2006   Metformin   Diffuse cystic mastopathy    Family history of breast cancer    Family history of liver cancer    Motion sickness    any moving vehicle   Orthodontics    braces   Personal history of chemotherapy    Sinus problem     PAST SURGICAL HISTORY :   Past Surgical History:  Procedure Laterality Date   BREAST BIOPSY Right 2005   negative   BREAST BIOPSY Left 08/23/2007   negative   BREAST BIOPSY Left 10/24/2013   positive   BREAST BIOPSY Left 07/2002   neg   BREAST BIOPSY Left 08/23/2007   neg   BREAST SURGERY Left 10/2011   Cyst Aspirationapocrine metaplasia, ductal cells and bone cells, hypo-cellular   BREAST SURGERY Left 2009   ADH on stereotactic biopsy, 1.5 mm focus.   BREAST SURGERY Left 2003   fibrocystic changes with ductal hyperplasia without atypia.   BREAST SURGERY Left    mastectomy   BREAST SURGERY Left April 11, 2014   Removal of implant, debridement chest wall Dr.Coan   CESAREAN SECTION  1991   CHOLECYSTECTOMY  1990   COLONOSCOPY  08/26/2013   Lutricia Feil, M.D. normal.   CRYOABLATION  2005, 2010   CYST REMOVAL NECK  10/2011   Dr. Jenne Campus   ESOPHAGOGASTRODUODENOSCOPY (EGD) WITH PROPOFOL N/A 01/16/2017   Procedure: ESOPHAGOGASTRODUODENOSCOPY (EGD) WITH PROPOFOL;  Surgeon: Midge Minium, MD;  Location: [facility];  Service: Gastroenterology;  Laterality: N/A;  Diabetic - oral meds   INCISION AND DRAINAGE Left Dec 2015, Feb 2016   Dr Meriam Sprague   MASTECTOMY Left 11/24/2013    positive   PORTACATH PLACEMENT  11/24/13    FAMILY HISTORY :   Family History  Problem Relation Age of Onset   Liver cancer Father    Hemochromatosis Father    Liver disease Father    Diabetes Mother    Hypertension Mother    Breast cancer Paternal Aunt 79   Ovarian cancer Maternal Grandmother        unk primary, metastatic cancer   Hemachromatosis Daughter    Liver cancer Paternal Uncle    Hemochromatosis Paternal Uncle     SOCIAL HISTORY:   Social History   Tobacco Use   Smoking status: Never   Smokeless tobacco: Never  Vaping Use   Vaping Use: Never used  Substance Use Topics   Alcohol use: Not Currently    Comment: occasionally, none recently   Drug use: No    ALLERGIES:  is allergic to doxycycline, exemestane, floxin [ofloxacin], gluten meal, levofloxacin, linezolid, sesame oil, taxotere [docetaxel], hydrocodone, invokana [canagliflozin], metformin and related, sulfa antibiotics, codeine, penicillins, and vancomycin.  MEDICATIONS:  Current Outpatient Medications  Medication Sig Dispense Refill   albuterol (VENTOLIN HFA) 108 (90 Base) MCG/ACT inhaler TAKE 2 PUFFS BY MOUTH  EVERY 6 HOURS AS NEEDED FOR WHEEZE OR SHORTNESS OF BREATH 6.7 each 1   aspirin EC 81 MG tablet Take 1 tablet (81 mg total) by mouth daily. Swallow whole.     B COMPLEX-C PO Take by mouth daily.     baclofen (LIORESAL) 10 MG tablet Take 10 mg by mouth at bedtime.     Budeson-Glycopyrrol-Formoterol (BREZTRI AEROSPHERE) 160-9-4.8 MCG/ACT AERO Inhale 2 puffs into the lungs 2 (two) times daily. Pt needs 3 month supply 10.7 g 3   busPIRone (BUSPAR) 10 MG tablet Take 5 mg by mouth 2 (two) times daily.     diphenhydrAMINE (BENADRYL) 25 MG tablet Take 25 mg by mouth every 6 (six) hours as needed.     DULoxetine (CYMBALTA) 20 MG capsule Take 1 capsule (20 mg total) by mouth daily. 90 capsule 2   EPINEPHrine (EPIPEN 2-PAK) 0.3 mg/0.3 mL IJ SOAJ injection Inject 0.3 mLs (0.3 mg total) into the skin as  needed. 1 each 1   fluticasone (FLONASE) 50 MCG/ACT nasal spray Place 1 spray into both nostrils daily. 16 g 3   furosemide (LASIX) 20 MG tablet Take 20 mg by mouth daily.     glucose blood (ONETOUCH VERIO) test strip Blood sugar testing done QD and as needed  E11.65 100 each 12   Insulin Pen Needle (BD PEN NEEDLE NANO 2ND GEN) 32G X 4 MM MISC Use as directed to inject [place] Daily into skin E11.65 300 each 3   lactulose (CHRONULAC) 10 GM/15ML solution TAKE 30 ML TWICE A DAY FOR CONSTIPATION AT A TIME 1892 mL 3   LINZESS 290 MCG CAPS capsule TAKE 1 CAPSULE BY MOUTH EVERY DAY BEFORE BREAKFAST 90 capsule 1   propranolol (INDERAL) 10 MG tablet Take 1 tablet (10 mg total) by mouth 3 (three) times daily. If bp is greater than 140 90 tablet 3   propranolol ER (INDERAL LA) 60 MG 24 hr capsule Take 1 capsule (60 mg total) by mouth daily. 90 capsule 1   Semaglutide, 1 MG/DOSE, (OZEMPIC, 1 MG/DOSE,) 4 MG/3ML SOPN INJECT 1 MG UNDER THE SKIN ONCE A WEEK 6 mL 5   spironolactone (ALDACTONE) 25 MG tablet Take 1 tablet (25 mg total) by mouth 2 (two) times daily. 180 tablet 1   tamoxifen (NOLVADEX) 10 MG tablet TAKE 1 TABLET DAILY 90 tablet 3   traMADol (ULTRAM) 50 MG tablet Take 1 tablet (50 mg total) by mouth 2 (two) times daily as needed for moderate pain or severe pain. 180 tablet 0   TRESIBA FLEXTOUCH 100 UNIT/ML FlexTouch Pen INJECT 40 UNITS UNDER THE SKIN DAILY IN THE EVENING. RAISE DOSAGE BY 2 UNITS EVERY 3 DAYS UNTIL BLOOD SUGAR IS 130 OR BELOW 45 mL 3   No current facility-administered medications for this visit.    PHYSICAL EXAMINATION: ECOG PERFORMANCE STATUS: 0 - Asymptomatic  BP 130/68 (BP Location: Right Arm, Patient Position: Sitting, Cuff Size: Normal)   Pulse 80   Temp (!) 96.7 F (35.9 C) (Tympanic)   Ht  (1.676 m)   Wt 190 lb 9.6 oz (86.5 kg)   SpO2 98%   BMI 30.76 kg/m   Filed Weights   09/03/22 0957  Weight: 190 lb 9.6 oz (86.5 kg)    Physical Exam HENT:     Head:  Normocephalic and atraumatic.     Mouth/Throat:     Pharynx: No oropharyngeal exudate.  Eyes:     Pupils: Pupils are equal, round, and reactive to light.  Cardiovascular:     Rate and Rhythm: Normal rate and regular rhythm.  Pulmonary:     Effort: No respiratory distress.     Breath sounds: No wheezing.  Abdominal:     General: Bowel sounds are normal. There is no distension.     Palpations: Abdomen is soft. There is no mass.     Tenderness: There is no abdominal tenderness. There is no guarding or rebound.  Musculoskeletal:        General: No tenderness. Normal range of motion.     Cervical back: Normal range of motion and neck supple.  Skin:    General: Skin is warm.     Comments:    Neurological:     Mental Status: She is alert and oriented to person, place, and time.  Psychiatric:        Mood and Affect: Affect normal.          LABORATORY DATA:  I have reviewed the data as listed    Component Value Date/Time   NA 136 09/03/2022 0954   NA 138 01/06/2022 1111   NA 138 09/04/2014 1358   K 4.1 09/03/2022 0954   K 4.0 09/04/2014 1358   CL 102 09/03/2022 0954   CL 99 (L) 09/04/2014 1358   CO2 28 09/03/2022 0954   CO2 31 09/04/2014 1358   GLUCOSE 128 (H) 09/03/2022 0954   GLUCOSE 136 (H) 09/04/2014 1358   BUN 20 09/03/2022 0954   BUN 10 01/06/2022 1111   BUN 18 09/04/2014 1358   CREATININE 0.71 09/03/2022 0954   CREATININE 0.53 09/04/2014 1358   CALCIUM 8.8 (L) 09/03/2022 0954   CALCIUM 9.7 09/04/2014 1358   PROT 7.4 09/03/2022 0954   PROT 7.1 01/06/2022 1111   PROT 7.8 09/04/2014 1358   ALBUMIN 3.6 09/03/2022 0954   ALBUMIN 3.8 (L) 01/06/2022 1111   ALBUMIN 4.1 09/04/2014 1358   AST 27 09/03/2022 0954   AST 32 09/04/2014 1358   ALT 23 09/03/2022 0954   ALT 33 09/04/2014 1358   ALKPHOS 77 09/03/2022 0954   ALKPHOS 73 09/04/2014 1358   BILITOT 0.5 09/03/2022 0954   BILITOT 0.4 01/06/2022 1111   BILITOT 0.6 09/04/2014 1358   GFRNONAA >60 09/03/2022  0954   GFRNONAA >60 09/04/2014 1358   GFRAA 112 01/18/2020 0851   GFRAA >60 09/04/2014 1358    No results found for: "SPEP", "UPEP"  Lab Results  Component Value Date   WBC 8.1 09/03/2022   NEUTROABS 4.8 09/03/2022   HGB 14.2 09/03/2022   HCT 42.1 09/03/2022   MCV 93.8 09/03/2022   PLT 230 09/03/2022      Chemistry      Component Value Date/Time   NA 136 09/03/2022 0954   NA 138 01/06/2022 1111   NA 138 09/04/2014 1358   K 4.1 09/03/2022 0954   K 4.0 09/04/2014 1358   CL 102 09/03/2022 0954   CL 99 (L) 09/04/2014 1358   CO2 28 09/03/2022 0954   CO2 31 09/04/2014 1358   BUN 20 09/03/2022 0954   BUN 10 01/06/2022 1111   BUN 18 09/04/2014 1358   CREATININE 0.71 09/03/2022 0954   CREATININE 0.53 09/04/2014 1358      Component Value Date/Time   CALCIUM 8.8 (L) 09/03/2022 0954   CALCIUM 9.7 09/04/2014 1358   ALKPHOS 77 09/03/2022 0954   ALKPHOS 73 09/04/2014 1358   AST 27 09/03/2022 0954   AST 32 09/04/2014 1358   ALT 23 09/03/2022 0954  ALT 33 09/04/2014 1358   BILITOT 0.5 09/03/2022 0954   BILITOT 0.4 01/06/2022 1111   BILITOT 0.6 09/04/2014 1358       RADIOGRAPHIC STUDIES: I have personally reviewed the radiological images as listed and agreed with the findings in the report. No results found.   ASSESSMENT & PLAN:  Carcinoma of upper-outer quadrant of left breast in female, estrogen receptor positive (HCC) # Stage I Breast cancer ER/PR/her 2 neu POS- currently on tamoxifen on extended therapy.On Tamoxifen 10 mg/day [20 mg-arthritis; until fall 2026].  CT scan chest abdomen pelvis-October 2023 NED.   # Patient overall feeling poorly given the significant musculoskeletal symptoms.  Recommend HOLD Tamoxifen x 3 months/ until next visit.  Low clinical suspicion of recurrent malignancy causing her MSK symptoms.  Discussed regarding a bone scan.  Hold for now.   # Clinically no evidence of recurrence. NOV 2023-Bil Mammogram -WNL- [Dr.Byrnett]-  stable.  # Right  RCC-s/p cryoablation; MRI aug 2022- stable  # Hot flashes/ vasomotor symptoms-from Tam- stable.   # MSK [s/p pain clinic] s/p [facility]; awaiting follow up-   # Cirrhosis; [[facility]-hepatologist/Dr.Wohl] Alpha-1 anti-trypsin- stable.   # OSA [[facility]-Pul]- on CPAP- improved   # BMD- [APRIl 2023-]  T score=--0.3 continue  vitamin D- STABLE; mild low ca- 8.8.  Continue calcium; along with vitamin D.  #Thyroid nodules-incidental [right-3 mm; left 2 nodules-3 and 4 mm] neck US-non-Thyroid  [place] 2023 monitor for now.  # DISPOSITION:  # follow up in 3 months- MD; no labs- -Dr.B        No orders of the defined types were placed in this encounter.  All questions were answered. The patient knows to call the clinic with any problems, questions or concerns.      Earna Coder, MD 09/03/2022 12:01 PM

## 2022-09-03 NOTE — Patient Instructions (Signed)
#   HOLD Tamoxifen x 3 months/ until next visit.

## 2022-09-03 NOTE — Assessment & Plan Note (Addendum)
#   Stage I Breast cancer ER/PR/her 2 neu POS- currently on tamoxifen on extended therapy.On Tamoxifen 10 mg/day [20 mg-arthritis; until fall 2026].  CT scan chest abdomen pelvis-October 2023 NED.   # Patient overall feeling poorly given the significant musculoskeletal symptoms.  Recommend HOLD Tamoxifen x 3 months/ until next visit.  Low clinical suspicion of recurrent malignancy causing her MSK symptoms.  Discussed regarding a bone scan.  Hold for now.   # Clinically no evidence of recurrence. NOV 2023-Bil Mammogram -WNL- [Dr.Byrnett]-  stable.  # Right RCC-s/p cryoablation; MRI aug 2022- stable  # Hot flashes/ vasomotor symptoms-from Tam- stable.   # MSK [s/p pain clinic] s/p [facility]; awaiting follow up-   # Cirrhosis; [[facility]-hepatologist/Dr.Wohl] Alpha-1 anti-trypsin- stable.   # OSA [[facility]-Pul]- on CPAP- improved   # BMD- [APRIl 2023-]  T score=--0.3 continue  vitamin D- STABLE; mild low ca- 8.8.  Continue calcium; along with vitamin D.  #Thyroid nodules-incidental [right-3 mm; left 2 nodules-3 and 4 mm] neck US-non-Thyroid  Macrh 2023 monitor for now.  # DISPOSITION:  # follow up in 3 months- MD; no labs- -Dr.B

## 2022-09-04 DIAGNOSIS — M130 Polyarthritis, unspecified: Secondary | ICD-10-CM | POA: Diagnosis not present

## 2022-09-04 DIAGNOSIS — M546 Pain in thoracic spine: Secondary | ICD-10-CM | POA: Diagnosis not present

## 2022-09-04 DIAGNOSIS — M545 Low back pain, unspecified: Secondary | ICD-10-CM | POA: Diagnosis not present

## 2022-09-04 DIAGNOSIS — Z8739 Personal history of other diseases of the musculoskeletal system and connective tissue: Secondary | ICD-10-CM | POA: Diagnosis not present

## 2022-09-06 DIAGNOSIS — K7460 Unspecified cirrhosis of liver: Secondary | ICD-10-CM | POA: Insufficient documentation

## 2022-09-09 DIAGNOSIS — M1990 Unspecified osteoarthritis, unspecified site: Secondary | ICD-10-CM | POA: Diagnosis not present

## 2022-09-09 DIAGNOSIS — Z6832 Body mass index (BMI) 32.0-32.9, adult: Secondary | ICD-10-CM | POA: Diagnosis not present

## 2022-09-09 DIAGNOSIS — I2510 Atherosclerotic heart disease of native coronary artery without angina pectoris: Secondary | ICD-10-CM | POA: Diagnosis not present

## 2022-09-09 DIAGNOSIS — E785 Hyperlipidemia, unspecified: Secondary | ICD-10-CM | POA: Diagnosis not present

## 2022-09-09 DIAGNOSIS — K7581 Nonalcoholic steatohepatitis (NASH): Secondary | ICD-10-CM | POA: Diagnosis not present

## 2022-09-09 DIAGNOSIS — K7460 Unspecified cirrhosis of liver: Secondary | ICD-10-CM | POA: Diagnosis not present

## 2022-09-09 DIAGNOSIS — E119 Type 2 diabetes mellitus without complications: Secondary | ICD-10-CM | POA: Diagnosis not present

## 2022-09-09 DIAGNOSIS — E669 Obesity, unspecified: Secondary | ICD-10-CM | POA: Diagnosis not present

## 2022-09-09 DIAGNOSIS — I8510 Secondary esophageal varices without bleeding: Secondary | ICD-10-CM | POA: Diagnosis not present

## 2022-09-09 DIAGNOSIS — I10 Essential (primary) hypertension: Secondary | ICD-10-CM | POA: Diagnosis not present

## 2022-09-09 DIAGNOSIS — I8500 Esophageal varices without bleeding: Secondary | ICD-10-CM | POA: Diagnosis not present

## 2022-09-09 DIAGNOSIS — I864 Gastric varices: Secondary | ICD-10-CM | POA: Diagnosis not present

## 2022-09-09 DIAGNOSIS — J45909 Unspecified asthma, uncomplicated: Secondary | ICD-10-CM | POA: Diagnosis not present

## 2022-09-09 DIAGNOSIS — Z13810 Encounter for screening for upper gastrointestinal disorder: Secondary | ICD-10-CM | POA: Diagnosis not present

## 2022-09-09 DIAGNOSIS — C50912 Malignant neoplasm of unspecified site of left female breast: Secondary | ICD-10-CM | POA: Diagnosis not present

## 2022-09-09 DIAGNOSIS — Z880 Allergy status to penicillin: Secondary | ICD-10-CM | POA: Diagnosis not present

## 2022-09-09 DIAGNOSIS — Z882 Allergy status to sulfonamides status: Secondary | ICD-10-CM | POA: Diagnosis not present

## 2022-09-12 DIAGNOSIS — G8929 Other chronic pain: Secondary | ICD-10-CM | POA: Diagnosis not present

## 2022-09-12 DIAGNOSIS — M549 Dorsalgia, unspecified: Secondary | ICD-10-CM | POA: Diagnosis not present

## 2022-09-12 DIAGNOSIS — M130 Polyarthritis, unspecified: Secondary | ICD-10-CM | POA: Diagnosis not present

## 2022-09-23 ENCOUNTER — Encounter: Payer: Self-pay | Admitting: Internal Medicine

## 2022-09-24 ENCOUNTER — Telehealth: Payer: BC Managed Care – PPO | Admitting: Nurse Practitioner

## 2022-09-24 ENCOUNTER — Encounter: Payer: Self-pay | Admitting: Nurse Practitioner

## 2022-09-24 VITALS — Resp 16 | Ht 66.0 in | Wt 200.0 lb

## 2022-09-24 DIAGNOSIS — J0100 Acute maxillary sinusitis, unspecified: Secondary | ICD-10-CM | POA: Diagnosis not present

## 2022-09-24 MED ORDER — AZITHROMYCIN 250 MG PO TABS
ORAL_TABLET | ORAL | 0 refills | Status: AC
Start: 2022-09-24 — End: 2022-09-29

## 2022-09-24 MED ORDER — PREDNISONE 10 MG (21) PO TBPK
ORAL_TABLET | ORAL | 0 refills | Status: DC
Start: 2022-09-24 — End: 2023-01-27

## 2022-09-24 NOTE — Progress Notes (Signed)
[facility] [address] [place], [place] [address]  Internal MEDICINE  Telephone Visit  Patient Name: Danielle Wallace  [id number]  [id number]  Date of Service: 09/24/2022  I connected with the patient at 1550 by telephone and verified the patients identity using two identifiers.   I discussed the limitations, risks, security and privacy concerns of performing an evaluation and management service by telephone and the availability of in person appointments. I also discussed with the patient that there may be a patient responsible charge related to the service.  The patient expressed understanding and agrees to proceed.    Chief Complaint  Patient presents with   Telephone Screen    Coughing, sore throat, sinus drainage. Covid test was negative. [phone]   Telephone Assessment    HPI Danielle Wallace presents for a telehealth virtual visit for symptoms of sinusitis  Reports cough, sore throat and sinus drainage  Covid test was negative Symptom onset was 1-2 days ago   Current Medication: Outpatient Encounter Medications as of 09/24/2022  Medication Sig   azithromycin (ZITHROMAX) 250 MG tablet Take 2 tablets on day 1, then 1 tablet daily on days 2 through 5   predniSONE (STERAPRED UNI-PAK 21 TAB) 10 MG (21) TBPK tablet Use as directed for 6 days   albuterol (VENTOLIN HFA) 108 (90 Base) MCG/ACT inhaler TAKE 2 PUFFS BY MOUTH EVERY 6 HOURS AS NEEDED FOR WHEEZE OR SHORTNESS OF BREATH   aspirin EC 81 MG tablet Take 1 tablet (81 mg total) by mouth daily. Swallow whole.   B COMPLEX-C PO Take by mouth daily.   Budeson-Glycopyrrol-Formoterol (BREZTRI AEROSPHERE) 160-9-4.8 MCG/ACT AERO Inhale 2 puffs into the lungs 2 (two) times daily. Pt needs 3 month supply   busPIRone (BUSPAR) 10 MG tablet Take 5 mg by mouth 2 (two) times daily.   diphenhydrAMINE (BENADRYL) 25 MG tablet Take 25 mg by mouth every 6 (six) hours as needed.   DULoxetine (CYMBALTA) 20 MG capsule Take 1 capsule (20 mg  total) by mouth daily.   EPINEPHrine (EPIPEN 2-PAK) 0.3 mg/0.3 mL IJ SOAJ injection Inject 0.3 mLs (0.3 mg total) into the skin as needed.   fluticasone (FLONASE) 50 MCG/ACT nasal spray Place 1 spray into both nostrils daily.   furosemide (LASIX) 20 MG tablet Take 20 mg by mouth daily.   glucose blood (ONETOUCH VERIO) test strip Blood sugar testing done QD and as needed  E11.65   Insulin Pen Needle (BD PEN NEEDLE NANO 2ND GEN) 32G X 4 MM MISC Use as directed to inject [place] Daily into skin E11.65   lactulose (CHRONULAC) 10 GM/15ML solution TAKE 30 ML TWICE A DAY FOR CONSTIPATION AT A TIME   LINZESS 290 MCG CAPS capsule TAKE 1 CAPSULE BY MOUTH EVERY DAY BEFORE BREAKFAST   propranolol (INDERAL) 10 MG tablet Take 1 tablet (10 mg total) by mouth 3 (three) times daily. If bp is greater than 140   propranolol ER (INDERAL LA) 60 MG 24 hr capsule Take 1 capsule (60 mg total) by mouth daily.   Semaglutide, 1 MG/DOSE, (OZEMPIC, 1 MG/DOSE,) 4 MG/3ML SOPN INJECT 1 MG UNDER THE SKIN ONCE A WEEK   spironolactone (ALDACTONE) 25 MG tablet Take 1 tablet (25 mg total) by mouth 2 (two) times daily.   tamoxifen (NOLVADEX) 10 MG tablet TAKE 1 TABLET DAILY   traMADol (ULTRAM) 50 MG tablet Take 1 tablet (50 mg total) by mouth 2 (two) times daily as needed for moderate pain or severe pain.   TRESIBA  FLEXTOUCH 100 UNIT/ML FlexTouch Pen INJECT 40 UNITS UNDER THE SKIN DAILY IN THE EVENING. RAISE DOSAGE BY 2 UNITS EVERY 3 DAYS UNTIL BLOOD SUGAR IS 130 OR BELOW   No facility-administered encounter medications on file as of 09/24/2022.    Surgical History: Past Surgical History:  Procedure Laterality Date   BREAST BIOPSY Right 2005   negative   BREAST BIOPSY Left 08/23/2007   negative   BREAST BIOPSY Left 10/24/2013   positive   BREAST BIOPSY Left 07/2002   neg   BREAST BIOPSY Left 08/23/2007   neg   BREAST SURGERY Left 10/2011   Cyst Aspirationapocrine metaplasia, ductal cells and bone cells, hypo-cellular   BREAST  SURGERY Left 2009   ADH on stereotactic biopsy, 1.5 mm focus.   BREAST SURGERY Left 2003   fibrocystic changes with ductal hyperplasia without atypia.   BREAST SURGERY Left    mastectomy   BREAST SURGERY Left April 11, 2014   Removal of implant, debridement chest wall Dr.Coan   CESAREAN SECTION  1991   CHOLECYSTECTOMY  1990   COLONOSCOPY  08/26/2013   Lutricia Feil, M.D. normal.   CRYOABLATION  2005, 2010   CYST REMOVAL NECK  10/2011   Dr. Jenne Campus   ESOPHAGOGASTRODUODENOSCOPY (EGD) WITH PROPOFOL N/A 01/16/2017   Procedure: ESOPHAGOGASTRODUODENOSCOPY (EGD) WITH PROPOFOL;  Surgeon: Midge Minium, MD;  Location: [facility];  Service: Gastroenterology;  Laterality: N/A;  Diabetic - oral meds   INCISION AND DRAINAGE Left Dec 2015, Feb 2016   Dr Meriam Sprague   MASTECTOMY Left 11/24/2013   positive   PORTACATH PLACEMENT  11/24/13    Medical History: Past Medical History:  Diagnosis Date   Arthritis 2006   Asthma    Breast cancer (HCC) 10/14/2013   18 mm, T1c, N0; ER/ PR positive,her 2 neu overexpressed. Adjuvant chemo/ herceptin.   Cancer [facility]) 2006   Renal cell carcinoma; cryosurgery treatment, right side   Cirrhosis (HCC)    Collapsed lung 2007   Diabetes mellitus without complication (HCC) 2006   Metformin   Diffuse cystic mastopathy    Family history of breast cancer    Family history of liver cancer    Motion sickness    any moving vehicle   Orthodontics    braces   Personal history of chemotherapy    Sinus problem     Family History: Family History  Problem Relation Age of Onset   Liver cancer Father    Hemochromatosis Father    Liver disease Father    Diabetes Mother    Hypertension Mother    Breast cancer Paternal Aunt 54   Ovarian cancer Maternal Grandmother        unk primary, metastatic cancer   Hemachromatosis Daughter    Liver cancer Paternal Uncle    Hemochromatosis Paternal Uncle     Social History   Socioeconomic History   Marital status: Married     Spouse name: Not on file   Number of children: Not on file   Years of education: Not on file   Highest education level: Not on file  Occupational History   Not on file  Tobacco Use   Smoking status: Never   Smokeless tobacco: Never  Vaping Use   Vaping Use: Never used  Substance and Sexual Activity   Alcohol use: Not Currently    Comment: occasionally, none recently   Drug use: No   Sexual activity: Not on file  Other Topics Concern   Not on file  Social History Narrative   Not on file   Social Determinants of Health   Financial Resource Strain: Not on file  Food Insecurity: Not on file  Transportation Needs: Not on file  Physical Activity: Not on file  Stress: Not on file  Social Connections: Not on file  Intimate Partner Violence: Not on file      Review of Systems  Constitutional:  Positive for fatigue. Negative for chills and fever.  HENT:  Positive for congestion, postnasal drip, sinus pressure, sinus pain and sore throat. Negative for ear pain and rhinorrhea.   Eyes:  Positive for pain.  Respiratory:  Positive for cough, chest tightness and shortness of breath. Negative for wheezing.   Cardiovascular: Negative.  Negative for chest pain and palpitations.  Musculoskeletal:  Negative for myalgias.  Neurological:  Negative for headaches.    Vital Signs: Resp 16   Ht 5\' 6"  (1.676 m)   Wt 200 lb (90.7 kg)   BMI 32.28 kg/m    Observation/Objective: She is alert and oriented and engages in conversation appropriately. No acute distress noted. Wet cough can be heard on audio.     Assessment/Plan: 1. Acute non-recurrent maxillary sinusitis Zpak and prednisone taper prescribed.  - azithromycin (ZITHROMAX) 250 MG tablet; Take 2 tablets on day 1, then 1 tablet daily on days 2 through 5  Dispense: 6 tablet; Refill: 0 - predniSONE (STERAPRED UNI-PAK 21 TAB) 10 MG (21) TBPK tablet; Use as directed for 6 days  Dispense: 21 tablet; Refill: 0   General Counseling:  Danielle Wallace verbalizes understanding of the findings of today's phone visit and agrees with plan of treatment. I have discussed any further diagnostic evaluation that may be needed or ordered today. We also reviewed her medications today. she has been encouraged to call the office with any questions or concerns that should arise related to todays visit.  Return if symptoms worsen or fail to improve.   No orders of the defined types were placed in this encounter.   Meds ordered this encounter  Medications   azithromycin (ZITHROMAX) 250 MG tablet    Sig: Take 2 tablets on day 1, then 1 tablet daily on days 2 through 5    Dispense:  6 tablet    Refill:  0   predniSONE (STERAPRED UNI-PAK 21 TAB) 10 MG (21) TBPK tablet    Sig: Use as directed for 6 days    Dispense:  21 tablet    Refill:  0    Time spent:10 Minutes Time spent with patient included reviewing progress notes, labs, imaging studies, and discussing plan for follow up.  [place] Controlled Substance Database was reviewed by me for overdose risk score (ORS) if appropriate.  This patient was seen by Sallyanne Kuster, FNP-C in collaboration with Dr. Beverely Risen as a part of collaborative care agreement.  Brodi Nery R. Tedd Sias, MSN, FNP-C Internal medicine

## 2022-09-25 DIAGNOSIS — G4733 Obstructive sleep apnea (adult) (pediatric): Secondary | ICD-10-CM | POA: Diagnosis not present

## 2022-09-30 DIAGNOSIS — M130 Polyarthritis, unspecified: Secondary | ICD-10-CM | POA: Diagnosis not present

## 2022-09-30 DIAGNOSIS — M549 Dorsalgia, unspecified: Secondary | ICD-10-CM | POA: Diagnosis not present

## 2022-09-30 DIAGNOSIS — G8929 Other chronic pain: Secondary | ICD-10-CM | POA: Diagnosis not present

## 2022-10-02 ENCOUNTER — Other Ambulatory Visit: Payer: Self-pay

## 2022-10-02 ENCOUNTER — Telehealth: Payer: Self-pay

## 2022-10-02 MED ORDER — AZITHROMYCIN 250 MG PO TABS: 250.0000 mg | ORAL_TABLET | Freq: Every day | ORAL | 0 refills | Status: DC

## 2022-10-02 NOTE — Telephone Encounter (Signed)
As per alyssa pt advised we send another round of zpak

## 2022-10-13 ENCOUNTER — Other Ambulatory Visit: Payer: Self-pay | Admitting: Internal Medicine

## 2022-10-13 DIAGNOSIS — M130 Polyarthritis, unspecified: Secondary | ICD-10-CM | POA: Diagnosis not present

## 2022-10-13 DIAGNOSIS — M549 Dorsalgia, unspecified: Secondary | ICD-10-CM | POA: Diagnosis not present

## 2022-10-13 DIAGNOSIS — G8929 Other chronic pain: Secondary | ICD-10-CM | POA: Diagnosis not present

## 2022-10-15 DIAGNOSIS — M130 Polyarthritis, unspecified: Secondary | ICD-10-CM | POA: Diagnosis not present

## 2022-10-15 DIAGNOSIS — G894 Chronic pain syndrome: Secondary | ICD-10-CM | POA: Diagnosis not present

## 2022-10-16 ENCOUNTER — Other Ambulatory Visit: Payer: Self-pay | Admitting: Nurse Practitioner

## 2022-10-16 DIAGNOSIS — K5909 Other constipation: Secondary | ICD-10-CM

## 2022-10-16 NOTE — Telephone Encounter (Signed)
Please review if ok to send please send

## 2022-10-26 ENCOUNTER — Other Ambulatory Visit: Payer: Self-pay | Admitting: Nurse Practitioner

## 2022-10-26 DIAGNOSIS — M1990 Unspecified osteoarthritis, unspecified site: Secondary | ICD-10-CM

## 2022-10-26 DIAGNOSIS — G4733 Obstructive sleep apnea (adult) (pediatric): Secondary | ICD-10-CM | POA: Diagnosis not present

## 2022-11-03 ENCOUNTER — Encounter: Payer: Self-pay | Admitting: Nurse Practitioner

## 2022-11-03 ENCOUNTER — Telehealth: Payer: BC Managed Care – PPO | Admitting: Nurse Practitioner

## 2022-11-03 VITALS — Ht 66.0 in | Wt 193.0 lb

## 2022-11-03 DIAGNOSIS — J069 Acute upper respiratory infection, unspecified: Secondary | ICD-10-CM | POA: Diagnosis not present

## 2022-11-03 DIAGNOSIS — U071 COVID-19: Secondary | ICD-10-CM

## 2022-11-03 DIAGNOSIS — R051 Acute cough: Secondary | ICD-10-CM

## 2022-11-03 MED ORDER — NIRMATRELVIR/RITONAVIR (PAXLOVID)TABLET
3.0000 | ORAL_TABLET | Freq: Two times a day (BID) | ORAL | 0 refills | Status: AC
Start: 2022-11-03 — End: 2022-11-08

## 2022-11-03 MED ORDER — HYDROCOD POLI-CHLORPHE POLI ER 10-8 MG/5ML PO SUER
5.0000 mL | Freq: Two times a day (BID) | ORAL | 0 refills | Status: DC | PRN
Start: 2022-11-03 — End: 2023-01-27

## 2022-11-03 NOTE — Progress Notes (Signed)
[facility] [address] [place], [place] [address]  Internal MEDICINE  Telephone Visit  Patient Name: Danielle Wallace  [id number]  [id number]  Date of Service: 11/03/2022  I connected with the patient at 1025 by telephone and verified the patients identity using two identifiers.   I discussed the limitations, risks, security and privacy concerns of performing an evaluation and management service by telephone and the availability of in person appointments. I also discussed with the patient that there may be a patient responsible charge related to the service.  The patient expressed understanding and agrees to proceed.    Chief Complaint  Patient presents with   Telephone Screen    [id number]   Telephone Assessment    Covid positive    Headache   Cough   Fever     Marieke presents for a telehealth virtual visit for covid positive URI Reports cough, fever, headache, fatigue. Had a fever 106F, chills, sinus pressure, hoarseness, sore throat, lack of appetite, nausea, ear pain, and nasal congestion Symptoms onset was about Friday night  She has not had paxlovid before and would like to have something to help control the cough    Current Medication: Outpatient Encounter Medications as of 11/03/2022  Medication Sig   albuterol (VENTOLIN HFA) 108 (90 Base) MCG/ACT inhaler TAKE 2 PUFFS BY MOUTH EVERY 6 HOURS AS NEEDED FOR WHEEZE OR SHORTNESS OF BREATH   aspirin EC 81 MG tablet Take 1 tablet (81 mg total) by mouth daily. Swallow whole.   azithromycin (ZITHROMAX) 250 MG tablet Take 1 tablet (250 mg total) by mouth daily.   B COMPLEX-C PO Take by mouth daily.   Budeson-Glycopyrrol-Formoterol (BREZTRI AEROSPHERE) 160-9-4.8 MCG/ACT AERO Inhale 2 puffs into the lungs 2 (two) times daily. Pt needs 3 month supply   busPIRone (BUSPAR) 10 MG tablet Take 5 mg by mouth 2 (two) times daily.   chlorpheniramine-HYDROcodone (TUSSIONEX) 10-8 MG/5ML Take 5 mLs by mouth every 12 (twelve)  hours as needed for cough.   diphenhydrAMINE (BENADRYL) 25 MG tablet Take 25 mg by mouth every 6 (six) hours as needed.   DULoxetine (CYMBALTA) 20 MG capsule Take 1 capsule (20 mg total) by mouth daily.   EPINEPHrine (EPIPEN 2-PAK) 0.3 mg/0.3 mL IJ SOAJ injection Inject 0.3 mLs (0.3 mg total) into the skin as needed.   fluticasone (FLONASE) 50 MCG/ACT nasal spray Place 1 spray into both nostrils daily.   furosemide (LASIX) 20 MG tablet Take 20 mg by mouth daily.   glucose blood (ONETOUCH VERIO) test strip Blood sugar testing done QD and as needed  E11.65   Insulin Pen Needle (BD PEN NEEDLE NANO 2ND GEN) 32G X 4 MM MISC Use as directed to inject [place] Daily into skin E11.65   lactulose (CHRONULAC) 10 GM/15ML solution TAKE 30 ML TWICE A DAY FOR CONSTIPATION AT A TIME   LINZESS 290 MCG CAPS capsule TAKE 1 CAPSULE BY MOUTH EVERY DAY BEFORE BREAKFAST   nirmatrelvir/ritonavir (PAXLOVID) 20 x 150 MG & 10 x 100MG  TABS Take 3 tablets by mouth 2 (two) times daily for 5 days.   predniSONE (STERAPRED UNI-PAK 21 TAB) 10 MG (21) TBPK tablet Use as directed for 6 days   propranolol (INDERAL) 10 MG tablet Take 1 tablet (10 mg total) by mouth 3 (three) times daily. If bp is greater than 140   propranolol ER (INDERAL LA) 60 MG 24 hr capsule TAKE 1 CAPSULE BY MOUTH EVERY DAY   Semaglutide, 1 MG/DOSE, (OZEMPIC, 1 MG/DOSE,)  4 MG/3ML SOPN INJECT 1 MG UNDER THE SKIN ONCE A WEEK   spironolactone (ALDACTONE) 25 MG tablet Take 1 tablet (25 mg total) by mouth 2 (two) times daily.   tamoxifen (NOLVADEX) 10 MG tablet TAKE 1 TABLET DAILY   traMADol (ULTRAM) 50 MG tablet TAKE 1 TABLET (50 MG TOTAL) BY MOUTH 2 (TWO) TIMES DAILY AS NEEDED FOR MODERATE PAIN OR SEVERE PAIN.   TRESIBA FLEXTOUCH 100 UNIT/ML FlexTouch Pen INJECT 40 UNITS UNDER THE SKIN DAILY IN THE EVENING. RAISE DOSAGE BY 2 UNITS EVERY 3 DAYS UNTIL BLOOD SUGAR IS 130 OR BELOW   No facility-administered encounter medications on file as of 11/03/2022.    Surgical  History: Past Surgical History:  Procedure Laterality Date   BREAST BIOPSY Right 2005   negative   BREAST BIOPSY Left 08/23/2007   negative   BREAST BIOPSY Left 10/24/2013   positive   BREAST BIOPSY Left 07/2002   neg   BREAST BIOPSY Left 08/23/2007   neg   BREAST SURGERY Left 10/2011   Cyst Aspirationapocrine metaplasia, ductal cells and bone cells, hypo-cellular   BREAST SURGERY Left 2009   ADH on stereotactic biopsy, 1.5 mm focus.   BREAST SURGERY Left 2003   fibrocystic changes with ductal hyperplasia without atypia.   BREAST SURGERY Left    mastectomy   BREAST SURGERY Left April 11, 2014   Removal of implant, debridement chest wall Dr.Coan   CESAREAN SECTION  1991   CHOLECYSTECTOMY  1990   COLONOSCOPY  08/26/2013   Lutricia Feil, M.D. normal.   CRYOABLATION  2005, 2010   CYST REMOVAL NECK  10/2011   Dr. Jenne Campus   ESOPHAGOGASTRODUODENOSCOPY (EGD) WITH PROPOFOL N/A 01/16/2017   Procedure: ESOPHAGOGASTRODUODENOSCOPY (EGD) WITH PROPOFOL;  Surgeon: Midge Minium, MD;  Location: [facility];  Service: Gastroenterology;  Laterality: N/A;  Diabetic - oral meds   INCISION AND DRAINAGE Left Dec 2015, Feb 2016   Dr Meriam Sprague   MASTECTOMY Left 11/24/2013   positive   PORTACATH PLACEMENT  11/24/13    Medical History: Past Medical History:  Diagnosis Date   Arthritis 2006   Asthma    Breast cancer (HCC) 10/14/2013   18 mm, T1c, N0; ER/ PR positive,her 2 neu overexpressed. Adjuvant chemo/ herceptin.   Cancer [facility]) 2006   Renal cell carcinoma; cryosurgery treatment, right side   Cirrhosis (HCC)    Collapsed lung 2007   Diabetes mellitus without complication (HCC) 2006   Metformin   Diffuse cystic mastopathy    Family history of breast cancer    Family history of liver cancer    Motion sickness    any moving vehicle   Orthodontics    braces   Personal history of chemotherapy    Sinus problem     Family History: Family History  Problem Relation Age of Onset   Liver cancer  Father    Hemochromatosis Father    Liver disease Father    Diabetes Mother    Hypertension Mother    Breast cancer Paternal Aunt 34   Ovarian cancer Maternal Grandmother        unk primary, metastatic cancer   Hemachromatosis Daughter    Liver cancer Paternal Uncle    Hemochromatosis Paternal Uncle     Social History   Socioeconomic History   Marital status: Married    Spouse name: Not on file   Number of children: Not on file   Years of education: Not on file   Highest education level: Not  on file  Occupational History   Not on file  Tobacco Use   Smoking status: Never   Smokeless tobacco: Never  Vaping Use   Vaping Use: Never used  Substance and Sexual Activity   Alcohol use: Not Currently    Comment: occasionally, none recently   Drug use: No   Sexual activity: Not on file  Other Topics Concern   Not on file  Social History Narrative   Not on file   Social Determinants of Health   Financial Resource Strain: Not on file  Food Insecurity: Not on file  Transportation Needs: Not on file  Physical Activity: Not on file  Stress: Not on file  Social Connections: Not on file  Intimate Partner Violence: Not on file      Review of Systems  Constitutional:  Positive for appetite change, chills, fatigue and fever.  HENT:  Positive for congestion, ear pain, postnasal drip, rhinorrhea, sinus pressure, sinus pain and sore throat.   Respiratory:  Positive for cough, chest tightness, shortness of breath and wheezing.   Cardiovascular: Negative.  Negative for chest pain and palpitations.  Gastrointestinal:  Positive for constipation and nausea. Negative for diarrhea and vomiting.  Musculoskeletal: Negative.   Neurological:  Positive for headaches.    Vital Signs: Ht 5\' 6"  (1.676 m)   Wt 193 lb (87.5 kg)   BMI 31.15 kg/m    Observation/Objective: She is alert and oriented and engages in conversation appropriately. She is having frequent coughing fits and it seems  to be a dry cough most of the time. Her voice is very hoarse when speaking. No acute distress noted.     Assessment/Plan: 1. Upper respiratory tract infection due to COVID-19 virus Paxlovid and cough medication prescribed.  - nirmatrelvir/ritonavir (PAXLOVID) 20 x 150 MG & 10 x 100MG  TABS; Take 3 tablets by mouth 2 (two) times daily for 5 days.  Dispense: 30 tablet; Refill: 0 - chlorpheniramine-HYDROcodone (TUSSIONEX) 10-8 MG/5ML; Take 5 mLs by mouth every 12 (twelve) hours as needed for cough.  Dispense: 140 mL; Refill: 0  2. Acute cough Medication prescribed for cough - chlorpheniramine-HYDROcodone (TUSSIONEX) 10-8 MG/5ML; Take 5 mLs by mouth every 12 (twelve) hours as needed for cough.  Dispense: 140 mL; Refill: 0   General Counseling: Linda verbalizes understanding of the findings of today's phone visit and agrees with plan of treatment. I have discussed any further diagnostic evaluation that may be needed or ordered today. We also reviewed her medications today. she has been encouraged to call the office with any questions or concerns that should arise related to todays visit.  Return if symptoms worsen or fail to improve, for need routine follow up for A1c check in august please..   No orders of the defined types were placed in this encounter.   Meds ordered this encounter  Medications   nirmatrelvir/ritonavir (PAXLOVID) 20 x 150 MG & 10 x 100MG  TABS    Sig: Take 3 tablets by mouth 2 (two) times daily for 5 days.    Dispense:  30 tablet    Refill:  0    GFR is >90   chlorpheniramine-HYDROcodone (TUSSIONEX) 10-8 MG/5ML    Sig: Take 5 mLs by mouth every 12 (twelve) hours as needed for cough.    Dispense:  140 mL    Refill:  0    Fill today    Time spent:10 Minutes Time spent with patient included reviewing progress notes, labs, imaging studies, and discussing plan for follow up.  [place] Controlled Substance Database was reviewed by me for overdose risk score (ORS) if  appropriate.  This patient was seen by Sallyanne Kuster, FNP-C in collaboration with Dr. Beverely Risen as a part of collaborative care agreement.  Shrita Thien R. Tedd Sias, MSN, FNP-C Internal medicine

## 2022-11-05 ENCOUNTER — Encounter: Payer: Self-pay | Admitting: Nurse Practitioner

## 2022-11-11 DIAGNOSIS — M13141 Monoarthritis, not elsewhere classified, right hand: Secondary | ICD-10-CM | POA: Diagnosis not present

## 2022-11-11 DIAGNOSIS — M130 Polyarthritis, unspecified: Secondary | ICD-10-CM | POA: Diagnosis not present

## 2022-11-18 ENCOUNTER — Other Ambulatory Visit: Payer: Self-pay | Admitting: Internal Medicine

## 2022-11-21 DIAGNOSIS — R000 Tachycardia, unspecified: Secondary | ICD-10-CM | POA: Diagnosis not present

## 2022-11-21 DIAGNOSIS — I2089 Other forms of angina pectoris: Secondary | ICD-10-CM | POA: Diagnosis not present

## 2022-11-21 DIAGNOSIS — R002 Palpitations: Secondary | ICD-10-CM | POA: Diagnosis not present

## 2022-11-21 DIAGNOSIS — R0602 Shortness of breath: Secondary | ICD-10-CM | POA: Diagnosis not present

## 2022-11-24 DIAGNOSIS — H903 Sensorineural hearing loss, bilateral: Secondary | ICD-10-CM | POA: Diagnosis not present

## 2022-11-25 DIAGNOSIS — G4733 Obstructive sleep apnea (adult) (pediatric): Secondary | ICD-10-CM | POA: Diagnosis not present

## 2022-12-03 ENCOUNTER — Inpatient Hospital Stay: Payer: BC Managed Care – PPO | Admitting: Internal Medicine

## 2022-12-10 ENCOUNTER — Ambulatory Visit (INDEPENDENT_AMBULATORY_CARE_PROVIDER_SITE_OTHER): Payer: BC Managed Care – PPO | Admitting: Nurse Practitioner

## 2022-12-10 ENCOUNTER — Telehealth: Payer: Self-pay | Admitting: Nurse Practitioner

## 2022-12-10 ENCOUNTER — Encounter: Payer: Self-pay | Admitting: Nurse Practitioner

## 2022-12-10 VITALS — BP 132/78 | HR 76 | Temp 98.2°F | Resp 16 | Ht 66.0 in | Wt 202.2 lb

## 2022-12-10 DIAGNOSIS — R229 Localized swelling, mass and lump, unspecified: Secondary | ICD-10-CM

## 2022-12-10 DIAGNOSIS — D225 Melanocytic nevi of trunk: Secondary | ICD-10-CM | POA: Diagnosis not present

## 2022-12-10 DIAGNOSIS — R768 Other specified abnormal immunological findings in serum: Secondary | ICD-10-CM

## 2022-12-10 DIAGNOSIS — M7918 Myalgia, other site: Secondary | ICD-10-CM | POA: Diagnosis not present

## 2022-12-10 DIAGNOSIS — R220 Localized swelling, mass and lump, head: Secondary | ICD-10-CM

## 2022-12-10 DIAGNOSIS — D2261 Melanocytic nevi of right upper limb, including shoulder: Secondary | ICD-10-CM | POA: Diagnosis not present

## 2022-12-10 DIAGNOSIS — Z23 Encounter for immunization: Secondary | ICD-10-CM

## 2022-12-10 DIAGNOSIS — R682 Dry mouth, unspecified: Secondary | ICD-10-CM

## 2022-12-10 DIAGNOSIS — D2271 Melanocytic nevi of right lower limb, including hip: Secondary | ICD-10-CM | POA: Diagnosis not present

## 2022-12-10 DIAGNOSIS — L718 Other rosacea: Secondary | ICD-10-CM | POA: Diagnosis not present

## 2022-12-10 MED ORDER — TETANUS-DIPHTH-ACELL PERTUSSIS 5-2.5-18.5 LF-MCG/0.5 IM SUSP
0.5000 mL | Freq: Once | INTRAMUSCULAR | 0 refills | Status: AC
Start: 2022-12-10 — End: 2022-12-10

## 2022-12-10 NOTE — Progress Notes (Signed)
[facility] [address] [place], [place] [address]  Internal MEDICINE  Office Visit Note  Patient Name: Danielle Wallace  [id number]  [id number]  Date of Service: 12/10/2022  Chief Complaint  Patient presents with   Diabetes   Follow-up    HPI Danielle Wallace presents for a follow-up visit for generalized feeling of unwell post covid.  Feeling better but taste and smell are gone again.  Fatigued but was like a cold.  Legs tingling and numbness and restless.  Subcutaneous nodules generalized Generalized swelling Positive ANA titer --speckled 1:80, cytoplasmic, unspecificed. -- need further testing.  Differential dx -- scleroderma, Lupus, sjogrens or other?   Current Medication: Outpatient Encounter Medications as of 12/10/2022  Medication Sig   albuterol (VENTOLIN HFA) 108 (90 Base) MCG/ACT inhaler TAKE 2 PUFFS BY MOUTH EVERY 6 HOURS AS NEEDED FOR WHEEZE OR SHORTNESS OF BREATH   aspirin EC 81 MG tablet Take 1 tablet (81 mg total) by mouth daily. Swallow whole.   azithromycin (ZITHROMAX) 250 MG tablet Take 1 tablet (250 mg total) by mouth daily.   B COMPLEX-C PO Take by mouth daily.   Budeson-Glycopyrrol-Formoterol (BREZTRI AEROSPHERE) 160-9-4.8 MCG/ACT AERO USE 2 INHALATIONS TWICE A DAY   busPIRone (BUSPAR) 10 MG tablet Take 5 mg by mouth 2 (two) times daily.   chlorpheniramine-HYDROcodone (TUSSIONEX) 10-8 MG/5ML Take 5 mLs by mouth every 12 (twelve) hours as needed for cough.   diphenhydrAMINE (BENADRYL) 25 MG tablet Take 25 mg by mouth every 6 (six) hours as needed.   DULoxetine (CYMBALTA) 20 MG capsule Take 1 capsule (20 mg total) by mouth daily.   EPINEPHrine (EPIPEN 2-PAK) 0.3 mg/0.3 mL IJ SOAJ injection Inject 0.3 mLs (0.3 mg total) into the skin as needed.   fluticasone (FLONASE) 50 MCG/ACT nasal spray Place 1 spray into both nostrils daily.   furosemide (LASIX) 20 MG tablet Take 20 mg by mouth daily.   glucose blood (ONETOUCH VERIO) test strip Blood sugar testing  done QD and as needed  E11.65   Insulin Pen Needle (BD PEN NEEDLE NANO 2ND GEN) 32G X 4 MM MISC Use as directed to inject Danielle Wallace Daily into skin E11.65   lactulose (CHRONULAC) 10 GM/15ML solution TAKE 30 ML TWICE A DAY FOR CONSTIPATION AT A TIME   LINZESS 290 MCG CAPS capsule TAKE 1 CAPSULE BY MOUTH EVERY DAY BEFORE BREAKFAST   predniSONE (STERAPRED UNI-PAK 21 TAB) 10 MG (21) TBPK tablet Use as directed for 6 days   propranolol (INDERAL) 10 MG tablet Take 1 tablet (10 mg total) by mouth 3 (three) times daily. If bp is greater than 140   propranolol ER (INDERAL LA) 60 MG 24 hr capsule TAKE 1 CAPSULE BY MOUTH EVERY DAY   Semaglutide, 1 MG/DOSE, (OZEMPIC, 1 MG/DOSE,) 4 MG/3ML SOPN INJECT 1 MG UNDER THE SKIN ONCE A WEEK   spironolactone (ALDACTONE) 25 MG tablet Take 1 tablet (25 mg total) by mouth 2 (two) times daily.   tamoxifen (NOLVADEX) 10 MG tablet TAKE 1 TABLET DAILY   traMADol (ULTRAM) 50 MG tablet TAKE 1 TABLET (50 MG TOTAL) BY MOUTH 2 (TWO) TIMES DAILY AS NEEDED FOR MODERATE PAIN OR SEVERE PAIN.   TRESIBA FLEXTOUCH 100 UNIT/ML FlexTouch Pen INJECT 40 UNITS UNDER THE SKIN DAILY IN THE EVENING. RAISE DOSAGE BY 2 UNITS EVERY 3 DAYS UNTIL BLOOD SUGAR IS 130 OR BELOW   [DISCONTINUED] Tdap (BOOSTRIX) 5-2.5-18.5 LF-MCG/0.5 injection Inject 0.5 mLs into the muscle once.   Tdap (BOOSTRIX) 5-2.5-18.5 LF-MCG/0.5 injection Inject  0.5 mLs into the muscle once for 1 dose.   No facility-administered encounter medications on file as of 12/10/2022.    Surgical History: Past Surgical History:  Procedure Laterality Date   BREAST BIOPSY Right 2005   negative   BREAST BIOPSY Left 08/23/2007   negative   BREAST BIOPSY Left 10/24/2013   positive   BREAST BIOPSY Left 07/2002   neg   BREAST BIOPSY Left 08/23/2007   neg   BREAST SURGERY Left 10/2011   Cyst Aspirationapocrine metaplasia, ductal cells and bone cells, hypo-cellular   BREAST SURGERY Left 2009   ADH on stereotactic biopsy, 1.5 mm focus.   BREAST  SURGERY Left 2003   fibrocystic changes with ductal hyperplasia without atypia.   BREAST SURGERY Left    mastectomy   BREAST SURGERY Left April 11, 2014   Removal of implant, debridement chest wall Dr.Coan   CESAREAN SECTION  1991   CHOLECYSTECTOMY  1990   COLONOSCOPY  08/26/2013   Danielle Wallace, M.D. normal.   CRYOABLATION  2005, 2010   CYST REMOVAL NECK  10/2011   Dr. Jenne Campus   ESOPHAGOGASTRODUODENOSCOPY (EGD) WITH PROPOFOL N/A 01/16/2017   Procedure: ESOPHAGOGASTRODUODENOSCOPY (EGD) WITH PROPOFOL;  Surgeon: Midge Minium, MD;  Location: [facility];  Service: Gastroenterology;  Laterality: N/A;  Diabetic - oral meds   INCISION AND DRAINAGE Left Dec 2015, Feb 2016   Dr Meriam Sprague   MASTECTOMY Left 11/24/2013   positive   PORTACATH PLACEMENT  11/24/13    Medical History: Past Medical History:  Diagnosis Date   Arthritis 2006   Asthma    Breast cancer (HCC) 10/14/2013   18 mm, T1c, N0; ER/ PR positive,her 2 neu overexpressed. Adjuvant chemo/ herceptin.   Cancer [facility]) 2006   Renal cell carcinoma; cryosurgery treatment, right side   Cirrhosis (HCC)    Collapsed lung 2007   Diabetes mellitus without complication (HCC) 2006   Metformin   Diffuse cystic mastopathy    Family history of breast cancer    Family history of liver cancer    Motion sickness    any moving vehicle   Orthodontics    braces   Personal history of chemotherapy    Sinus problem     Family History: Family History  Problem Relation Age of Onset   Liver cancer Father    Hemochromatosis Father    Liver disease Father    Diabetes Mother    Hypertension Mother    Breast cancer Paternal Aunt 63   Ovarian cancer Maternal Grandmother        unk primary, metastatic cancer   Hemachromatosis Daughter    Liver cancer Paternal Uncle    Hemochromatosis Paternal Uncle     Social History   Socioeconomic History   Marital status: Married    Spouse name: Not on file   Number of children: Not on file   Years of  education: Not on file   Highest education level: Not on file  Occupational History   Not on file  Tobacco Use   Smoking status: Never   Smokeless tobacco: Never  Vaping Use   Vaping status: Never Used  Substance and Sexual Activity   Alcohol use: Not Currently    Comment: occasionally, none recently   Drug use: No   Sexual activity: Not on file  Other Topics Concern   Not on file  Social History Narrative   Not on file   Social Determinants of Health   Financial Resource Strain: Not on  file  Food Insecurity: Not on file  Transportation Needs: Not on file  Physical Activity: Not on file  Stress: Not on file  Social Connections: Not on file  Intimate Partner Violence: Not on file      Review of Systems  Constitutional:  Positive for fatigue and unexpected weight change. Negative for fever.       No sense of taste or smell  HENT:  Negative for congestion, mouth sores and postnasal drip.        Difficulty hearing   Respiratory: Negative.  Negative for cough, chest tightness, shortness of breath and wheezing.   Cardiovascular:  Positive for leg swelling. Negative for chest pain and palpitations.  Gastrointestinal:  Positive for abdominal distention and abdominal pain.  Genitourinary:  Negative for flank pain.  Musculoskeletal:  Positive for arthralgias and back pain.  Skin:        Subcutaneous nodules scattered   Neurological:  Positive for weakness and numbness.  Psychiatric/Behavioral:  Positive for behavioral problems and sleep disturbance. Negative for self-injury and suicidal ideas. The patient is nervous/anxious.     Vital Signs: BP 132/78   Pulse 76   Temp 98.2 F (36.8 C)   Resp 16   Ht 5\' 6"  (1.676 m)   Wt 202 lb 3.2 oz (91.7 kg)   SpO2 96%   BMI 32.64 kg/m    Physical Exam Vitals reviewed.  Constitutional:      General: She is not in acute distress.    Appearance: Normal appearance. She is obese. She is not ill-appearing.  HENT:     Head:  Normocephalic and atraumatic.  Eyes:     Pupils: Pupils are equal, round, and reactive to light.  Cardiovascular:     Rate and Rhythm: Normal rate and regular rhythm.     Heart sounds: Normal heart sounds. No murmur heard. Pulmonary:     Effort: Pulmonary effort is normal. No respiratory distress.     Breath sounds: Normal breath sounds. No wheezing.  Abdominal:     General: There is distension.     Palpations: Abdomen is soft. There is fluid wave.  Musculoskeletal:        General: Swelling (generalized) present.  Skin:    General: Skin is warm and dry.     Comments: Palpated some of the scatter subcutaneous nodules.  Neurological:     Mental Status: She is alert and oriented to person, place, and time.  Psychiatric:        Mood and Affect: Mood normal.        Behavior: Behavior normal.        Assessment/Plan: 1. Positive ANA (antinuclear antibody) Referred to rheumatology and additional albs ordered  - Lupus (SLE) Analysis - Scleroderma Diagnostic Profile - Sjogren's syndrome antibods(ssa + ssb) - Ambulatory referral to Rheumatology  2. Generalized subcutaneous nodules Additional labs and rheumatology referral  - Lupus (SLE) Analysis - Scleroderma Diagnostic Profile - Sjogren's syndrome antibods(ssa + ssb) - Ambulatory referral to Rheumatology  3. Swollen tongue Referral to rheumatology and additional labs  - Lupus (SLE) Analysis - Scleroderma Diagnostic Profile - Sjogren's syndrome antibods(ssa + ssb) - Ambulatory referral to Rheumatology  4. Myalgia, multiple sites Additional labs and rheum referral - Lupus (SLE) Analysis - Scleroderma Diagnostic Profile - Sjogren's syndrome antibods(ssa + ssb) - Ambulatory referral to Rheumatology  5. Dry mouth Additional testing ordered as well as rheumatology referral - Lupus (SLE) Analysis - Scleroderma Diagnostic Profile - Sjogren's syndrome antibods(ssa + ssb) -  Ambulatory referral to Rheumatology  6. Need for  vaccination - Tdap (BOOSTRIX) 5-2.5-18.5 LF-MCG/0.5 injection; Inject 0.5 mLs into the muscle once for 1 dose.  Dispense: 0.5 mL; Refill: 0   General Counseling: Jolyn verbalizes understanding of the findings of todays visit and agrees with plan of treatment. I have discussed any further diagnostic evaluation that may be needed or ordered today. We also reviewed her medications today. she has been encouraged to call the office with any questions or concerns that should arise related to todays visit.    Orders Placed This Encounter  Procedures   Lupus (SLE) Analysis   Scleroderma Diagnostic Profile   Sjogren's syndrome antibods(ssa + ssb)   Ambulatory referral to Rheumatology    Meds ordered this encounter  Medications   Tdap (BOOSTRIX) 5-2.5-18.5 LF-MCG/0.5 injection    Sig: Inject 0.5 mLs into the muscle once for 1 dose.    Dispense:  0.5 mL    Refill:  0    Return in about 3 months (around 03/12/2023) for F/U,  PCP.   Total time spent:30 Minutes Time spent includes review of chart, medications, test results, and follow up plan with the patient.   [place] Controlled Substance Database was reviewed by me.  This patient was seen by Sallyanne Kuster, FNP-C in collaboration with Dr. Beverely Risen as a part of collaborative care agreement.    R. Tedd Sias, MSN, FNP-C Internal medicine

## 2022-12-10 NOTE — Telephone Encounter (Signed)
Rheumatology referral sent via Epic to Dr. Eduard Roux

## 2022-12-16 ENCOUNTER — Other Ambulatory Visit: Payer: Self-pay | Admitting: Nurse Practitioner

## 2022-12-17 DIAGNOSIS — R220 Localized swelling, mass and lump, head: Secondary | ICD-10-CM | POA: Diagnosis not present

## 2022-12-17 DIAGNOSIS — R768 Other specified abnormal immunological findings in serum: Secondary | ICD-10-CM | POA: Diagnosis not present

## 2022-12-17 DIAGNOSIS — M7918 Myalgia, other site: Secondary | ICD-10-CM | POA: Diagnosis not present

## 2022-12-17 DIAGNOSIS — E119 Type 2 diabetes mellitus without complications: Secondary | ICD-10-CM | POA: Diagnosis not present

## 2022-12-17 DIAGNOSIS — H04123 Dry eye syndrome of bilateral lacrimal glands: Secondary | ICD-10-CM | POA: Diagnosis not present

## 2022-12-17 DIAGNOSIS — H524 Presbyopia: Secondary | ICD-10-CM | POA: Diagnosis not present

## 2022-12-17 DIAGNOSIS — R229 Localized swelling, mass and lump, unspecified: Secondary | ICD-10-CM | POA: Diagnosis not present

## 2022-12-17 DIAGNOSIS — H2513 Age-related nuclear cataract, bilateral: Secondary | ICD-10-CM | POA: Diagnosis not present

## 2022-12-20 ENCOUNTER — Encounter: Payer: Self-pay | Admitting: Nurse Practitioner

## 2022-12-23 ENCOUNTER — Encounter: Payer: Self-pay | Admitting: Nurse Practitioner

## 2022-12-24 ENCOUNTER — Encounter: Payer: Self-pay | Admitting: Nurse Practitioner

## 2022-12-24 ENCOUNTER — Ambulatory Visit (INDEPENDENT_AMBULATORY_CARE_PROVIDER_SITE_OTHER): Payer: BC Managed Care – PPO | Admitting: Nurse Practitioner

## 2022-12-24 VITALS — BP 118/70 | HR 81 | Temp 98.3°F | Resp 16 | Ht 66.0 in | Wt 205.0 lb

## 2022-12-24 DIAGNOSIS — Z7982 Long term (current) use of aspirin: Secondary | ICD-10-CM | POA: Diagnosis not present

## 2022-12-24 DIAGNOSIS — E785 Hyperlipidemia, unspecified: Secondary | ICD-10-CM | POA: Diagnosis not present

## 2022-12-24 DIAGNOSIS — J329 Chronic sinusitis, unspecified: Secondary | ICD-10-CM | POA: Diagnosis not present

## 2022-12-24 DIAGNOSIS — G4733 Obstructive sleep apnea (adult) (pediatric): Secondary | ICD-10-CM | POA: Diagnosis not present

## 2022-12-24 DIAGNOSIS — K760 Fatty (change of) liver, not elsewhere classified: Secondary | ICD-10-CM | POA: Diagnosis not present

## 2022-12-24 DIAGNOSIS — Z794 Long term (current) use of insulin: Secondary | ICD-10-CM | POA: Diagnosis not present

## 2022-12-24 DIAGNOSIS — E119 Type 2 diabetes mellitus without complications: Secondary | ICD-10-CM | POA: Diagnosis not present

## 2022-12-24 DIAGNOSIS — Z85528 Personal history of other malignant neoplasm of kidney: Secondary | ICD-10-CM | POA: Diagnosis not present

## 2022-12-24 DIAGNOSIS — J439 Emphysema, unspecified: Secondary | ICD-10-CM | POA: Diagnosis not present

## 2022-12-24 DIAGNOSIS — E1165 Type 2 diabetes mellitus with hyperglycemia: Secondary | ICD-10-CM | POA: Diagnosis not present

## 2022-12-24 DIAGNOSIS — I471 Supraventricular tachycardia, unspecified: Secondary | ICD-10-CM | POA: Diagnosis not present

## 2022-12-24 DIAGNOSIS — K2940 Chronic atrophic gastritis without bleeding: Secondary | ICD-10-CM

## 2022-12-24 DIAGNOSIS — Z7985 Long-term (current) use of injectable non-insulin antidiabetic drugs: Secondary | ICD-10-CM | POA: Diagnosis not present

## 2022-12-24 DIAGNOSIS — R5383 Other fatigue: Secondary | ICD-10-CM | POA: Diagnosis not present

## 2022-12-24 DIAGNOSIS — Z853 Personal history of malignant neoplasm of breast: Secondary | ICD-10-CM | POA: Diagnosis not present

## 2022-12-24 DIAGNOSIS — R0902 Hypoxemia: Secondary | ICD-10-CM | POA: Diagnosis not present

## 2022-12-24 DIAGNOSIS — E538 Deficiency of other specified B group vitamins: Secondary | ICD-10-CM

## 2022-12-24 DIAGNOSIS — R42 Dizziness and giddiness: Secondary | ICD-10-CM | POA: Diagnosis not present

## 2022-12-24 DIAGNOSIS — Z148 Genetic carrier of other disease: Secondary | ICD-10-CM | POA: Diagnosis not present

## 2022-12-24 DIAGNOSIS — K7460 Unspecified cirrhosis of liver: Secondary | ICD-10-CM | POA: Diagnosis not present

## 2022-12-24 NOTE — Progress Notes (Signed)
[facility] [address] [place], [place] [address]  Internal MEDICINE  Office Visit Note  Patient Name: Danielle Wallace  [id number]  [id number]  Date of Service: 12/24/2022  Chief Complaint  Patient presents with   Follow-up    Review labs    HPI Danielle Wallace presents for a follow-up visit for lab results. Lupus analysis lab done which also screened for scleroderma and sjogrens. The screening was normal except for an elevated parietal cell antibody level which is often seen in pernicious anemia esp in the setting of chronic atrophic gastritis. Reports abdominal pain, epigastric pain, bloating, nausea, sometimes vomiting, abdominal tenderness, fatigue, and other symptoms. Many of these symptoms are consistent with chronic atrophic gastritis.     Current Medication: Outpatient Encounter Medications as of 12/24/2022  Medication Sig   albuterol (VENTOLIN HFA) 108 (90 Base) MCG/ACT inhaler TAKE 2 PUFFS BY MOUTH EVERY 6 HOURS AS NEEDED FOR WHEEZE OR SHORTNESS OF BREATH   aspirin EC 81 MG tablet Take 1 tablet (81 mg total) by mouth daily. Swallow whole.   azithromycin (ZITHROMAX) 250 MG tablet Take 1 tablet (250 mg total) by mouth daily.   B COMPLEX-C PO Take by mouth daily.   Budeson-Glycopyrrol-Formoterol (BREZTRI AEROSPHERE) 160-9-4.8 MCG/ACT AERO USE 2 INHALATIONS TWICE A DAY   busPIRone (BUSPAR) 10 MG tablet Take 5 mg by mouth 2 (two) times daily.   chlorpheniramine-HYDROcodone (TUSSIONEX) 10-8 MG/5ML Take 5 mLs by mouth every 12 (twelve) hours as needed for cough.   diphenhydrAMINE (BENADRYL) 25 MG tablet Take 25 mg by mouth every 6 (six) hours as needed.   DULoxetine (CYMBALTA) 20 MG capsule Take 1 capsule (20 mg total) by mouth daily.   EPINEPHrine (EPIPEN 2-PAK) 0.3 mg/0.3 mL IJ SOAJ injection Inject 0.3 mLs (0.3 mg total) into the skin as needed.   fluticasone (FLONASE) 50 MCG/ACT nasal spray Place 1 spray into both nostrils daily.   furosemide (LASIX) 20 MG tablet Take  20 mg by mouth daily.   glucose blood (ONETOUCH VERIO) test strip Blood sugar testing done QD and as needed  E11.65   lactulose (CHRONULAC) 10 GM/15ML solution TAKE 30 ML TWICE A DAY FOR CONSTIPATION AT A TIME   LINZESS 290 MCG CAPS capsule TAKE 1 CAPSULE BY MOUTH EVERY DAY BEFORE BREAKFAST   predniSONE (STERAPRED UNI-PAK 21 TAB) 10 MG (21) TBPK tablet Use as directed for 6 days   propranolol (INDERAL) 10 MG tablet Take 1 tablet (10 mg total) by mouth 3 (three) times daily. If bp is greater than 140   propranolol ER (INDERAL LA) 60 MG 24 hr capsule TAKE 1 CAPSULE BY MOUTH EVERY DAY   Semaglutide, 1 MG/DOSE, (OZEMPIC, 1 MG/DOSE,) 4 MG/3ML SOPN INJECT 1 MG UNDER THE SKIN ONCE A WEEK   spironolactone (ALDACTONE) 25 MG tablet Take 1 tablet (25 mg total) by mouth 2 (two) times daily.   tamoxifen (NOLVADEX) 10 MG tablet TAKE 1 TABLET DAILY   traMADol (ULTRAM) 50 MG tablet TAKE 1 TABLET (50 MG TOTAL) BY MOUTH 2 (TWO) TIMES DAILY AS NEEDED FOR MODERATE PAIN OR SEVERE PAIN.   [DISCONTINUED] Insulin Pen Needle (BD PEN NEEDLE NANO 2ND GEN) 32G X 4 MM MISC Use as directed to inject [place] Daily into skin E11.65   [DISCONTINUED] TRESIBA FLEXTOUCH 100 UNIT/ML FlexTouch Pen INJECT 40 UNITS UNDER THE SKIN DAILY IN THE EVENING. RAISE DOSAGE BY 2 UNITS EVERY 3 DAYS UNTIL BLOOD SUGAR IS 130 OR BELOW   No facility-administered encounter medications on file  as of 12/24/2022.    Surgical History: Past Surgical History:  Procedure Laterality Date   BREAST BIOPSY Right 2005   negative   BREAST BIOPSY Left 08/23/2007   negative   BREAST BIOPSY Left 10/24/2013   positive   BREAST BIOPSY Left 07/2002   neg   BREAST BIOPSY Left 08/23/2007   neg   BREAST SURGERY Left 10/2011   Cyst Aspirationapocrine metaplasia, ductal cells and bone cells, hypo-cellular   BREAST SURGERY Left 2009   ADH on stereotactic biopsy, 1.5 mm focus.   BREAST SURGERY Left 2003   fibrocystic changes with ductal hyperplasia without atypia.    BREAST SURGERY Left    mastectomy   BREAST SURGERY Left April 11, 2014   Removal of implant, debridement chest wall Dr.Coan   CESAREAN SECTION  1991   CHOLECYSTECTOMY  1990   COLONOSCOPY  08/26/2013   Lutricia Feil, M.D. normal.   CRYOABLATION  2005, 2010   CYST REMOVAL NECK  10/2011   Dr. Jenne Campus   ESOPHAGOGASTRODUODENOSCOPY (EGD) WITH PROPOFOL N/A 01/16/2017   Procedure: ESOPHAGOGASTRODUODENOSCOPY (EGD) WITH PROPOFOL;  Surgeon: Midge Minium, MD;  Location: [facility];  Service: Gastroenterology;  Laterality: N/A;  Diabetic - oral meds   INCISION AND DRAINAGE Left Dec 2015, Feb 2016   Dr Meriam Sprague   MASTECTOMY Left 11/24/2013   positive   PORTACATH PLACEMENT  11/24/13    Medical History: Past Medical History:  Diagnosis Date   Arthritis 2006   Asthma    Breast cancer (HCC) 10/14/2013   18 mm, T1c, N0; ER/ PR positive,her 2 neu overexpressed. Adjuvant chemo/ herceptin.   Cancer [facility]) 2006   Renal cell carcinoma; cryosurgery treatment, right side   Cirrhosis (HCC)    Collapsed lung 2007   Diabetes mellitus without complication (HCC) 2006   Metformin   Diffuse cystic mastopathy    Family history of breast cancer    Family history of liver cancer    Motion sickness    any moving vehicle   Orthodontics    braces   Personal history of chemotherapy    Sinus problem     Family History: Family History  Problem Relation Age of Onset   Liver cancer Father    Hemochromatosis Father    Liver disease Father    Diabetes Mother    Hypertension Mother    Breast cancer Paternal Aunt 70   Ovarian cancer Maternal Grandmother        unk primary, metastatic cancer   Hemachromatosis Daughter    Liver cancer Paternal Uncle    Hemochromatosis Paternal Uncle     Social History   Socioeconomic History   Marital status: Married    Spouse name: Not on file   Number of children: Not on file   Years of education: Not on file   Highest education level: Not on file  Occupational  History   Not on file  Tobacco Use   Smoking status: Never   Smokeless tobacco: Never  Vaping Use   Vaping status: Never Used  Substance and Sexual Activity   Alcohol use: Not Currently    Comment: occasionally, none recently   Drug use: No   Sexual activity: Not on file  Other Topics Concern   Not on file  Social History Narrative   Not on file   Social Determinants of Health   Financial Resource Strain: Not on file  Food Insecurity: Not on file  Transportation Needs: Not on file  Physical Activity: Not  on file  Stress: Not on file  Social Connections: Not on file  Intimate Partner Violence: Not on file      Review of Systems  Constitutional:  Positive for fatigue and unexpected weight change. Negative for fever.       No sense of taste or smell  HENT:  Negative for congestion, mouth sores and postnasal drip.        Difficulty hearing   Respiratory: Negative.  Negative for cough, chest tightness, shortness of breath and wheezing.   Cardiovascular:  Positive for leg swelling. Negative for chest pain and palpitations.  Gastrointestinal:  Positive for abdominal distention and abdominal pain.  Genitourinary:  Negative for flank pain.  Musculoskeletal:  Positive for arthralgias and back pain.  Skin:        Subcutaneous nodules scattered   Neurological:  Positive for weakness and numbness.  Psychiatric/Behavioral:  Positive for behavioral problems and sleep disturbance. Negative for self-injury and suicidal ideas. The patient is nervous/anxious.     Vital Signs: BP 118/70   Pulse 81   Temp 98.3 F (36.8 C)   Resp 16   Ht 5\' 6"  (1.676 m)   Wt 205 lb (93 kg)   SpO2 95%   BMI 33.09 kg/m    Physical Exam Vitals reviewed.  Constitutional:      General: She is not in acute distress.    Appearance: Normal appearance. She is obese. She is not ill-appearing.  HENT:     Head: Normocephalic and atraumatic.  Eyes:     Pupils: Pupils are equal, round, and reactive to  light.  Cardiovascular:     Rate and Rhythm: Normal rate and regular rhythm.     Heart sounds: Normal heart sounds. No murmur heard. Pulmonary:     Effort: Pulmonary effort is normal. No respiratory distress.     Breath sounds: Normal breath sounds. No wheezing.  Abdominal:     General: There is distension.     Palpations: Abdomen is soft. There is fluid wave.  Musculoskeletal:        General: Swelling (generalized) present.  Skin:    General: Skin is warm and dry.     Comments: Palpated some of the scatter subcutaneous nodules.  Neurological:     Mental Status: She is alert and oriented to person, place, and time.  Psychiatric:        Mood and Affect: Mood normal.        Behavior: Behavior normal.        Assessment/Plan: 1. 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 Type 2 diabetes mellitus with hyperglycemia, with long-term current use of insulin (HCC) Additional labs ordered  - TSH + free T4 - Hgb A1C w/o eAG  3. B12 deficiency Additional labs ordered  - B12 and Folate Panel - CBC with Differential/Platelet - Iron, TIBC and Ferritin Panel - Methylmalonic acid, serum - Homocysteine - Intrinsic Factor Antibodies   General Counseling: Danielle Wallace verbalizes understanding of the findings of todays visit and agrees with plan of treatment. I have discussed any further diagnostic evaluation that may be needed or ordered today. We also reviewed her medications today. she has been encouraged to call the office with any questions or concerns that should arise related to todays visit.  Orders Placed This Encounter  Procedures   B12 and Folate Panel   TSH + free T4   CBC with Differential/Platelet   Iron, TIBC and Ferritin Panel   Hgb A1C w/o  eAG   Methylmalonic acid, serum   Homocysteine   Intrinsic Factor Antibodies    No orders of the defined types were placed in this encounter.   Return in about 2 weeks (around 01/07/2023) for F/U, Labs, Antoinett Dorman PCP.   Total time spent:30 Minutes Time spent includes review of chart, medications, test results, and follow up plan with the patient.   [place] Controlled Substance Database was reviewed by me.  This patient was seen by Sallyanne Kuster, FNP-C in collaboration with Dr. Beverely Risen as a part of collaborative care agreement.   Andersen Iorio R. Tedd Sias, MSN, FNP-C Internal medicine

## 2022-12-25 DIAGNOSIS — Z794 Long term (current) use of insulin: Secondary | ICD-10-CM | POA: Diagnosis not present

## 2022-12-25 DIAGNOSIS — E538 Deficiency of other specified B group vitamins: Secondary | ICD-10-CM | POA: Diagnosis not present

## 2022-12-25 DIAGNOSIS — E038 Other specified hypothyroidism: Secondary | ICD-10-CM | POA: Diagnosis not present

## 2022-12-25 DIAGNOSIS — K2940 Chronic atrophic gastritis without bleeding: Secondary | ICD-10-CM | POA: Diagnosis not present

## 2022-12-25 DIAGNOSIS — E1165 Type 2 diabetes mellitus with hyperglycemia: Secondary | ICD-10-CM | POA: Diagnosis not present

## 2022-12-26 DIAGNOSIS — G4733 Obstructive sleep apnea (adult) (pediatric): Secondary | ICD-10-CM | POA: Diagnosis not present

## 2022-12-27 ENCOUNTER — Other Ambulatory Visit: Payer: Self-pay | Admitting: Internal Medicine

## 2022-12-29 NOTE — Telephone Encounter (Signed)
Please that its ok to send

## 2022-12-31 LAB — CBC WITH DIFFERENTIAL/PLATELET
Basophils Absolute: 0 10*3/uL (ref 0.0–0.2)
Basos: 1 %
EOS (ABSOLUTE): 0.1 10*3/uL (ref 0.0–0.4)
Eos: 2 %
Hematocrit: 42.1 % (ref 34.0–46.6)
Hemoglobin: 14 g/dL (ref 11.1–15.9)
Immature Grans (Abs): 0 10*3/uL (ref 0.0–0.1)
Immature Granulocytes: 0 %
Lymphocytes Absolute: 1.9 10*3/uL (ref 0.7–3.1)
Lymphs: 37 %
MCH: 30.6 pg (ref 26.6–33.0)
MCHC: 33.3 g/dL (ref 31.5–35.7)
MCV: 92 fL (ref 79–97)
Monocytes Absolute: 0.5 10*3/uL (ref 0.1–0.9)
Monocytes: 10 %
Neutrophils Absolute: 2.6 10*3/uL (ref 1.4–7.0)
Neutrophils: 50 %
Platelets: 169 10*3/uL (ref 150–450)
RBC: 4.57 x10E6/uL (ref 3.77–5.28)
RDW: 12.5 % (ref 11.7–15.4)
WBC: 5.2 10*3/uL (ref 3.4–10.8)

## 2022-12-31 LAB — B12 AND FOLATE PANEL
Folate: >20 ng/mL (ref >3.0)
Vitamin B-12: 777 pg/mL (ref 232–1245)

## 2022-12-31 LAB — IRON,TIBC AND FERRITIN PANEL
Ferritin: 73 ng/mL (ref 15–150)
Iron Saturation: 33 % (ref 15–55)
Iron: 105 ug/dL (ref 27–139)
Total Iron Binding Capacity: 315 ug/dL (ref 250–450)
UIBC: 210 ug/dL (ref 118–369)

## 2022-12-31 LAB — TSH+FREE T4
Free T4: 1.04 ng/dL (ref 0.82–1.77)
TSH: 1.79 u[IU]/mL (ref 0.450–4.500)

## 2022-12-31 LAB — HGB A1C W/O EAG: Hgb A1c MFr Bld: 6.9 % — ABNORMAL HIGH (ref 4.8–5.6)

## 2022-12-31 LAB — METHYLMALONIC ACID, SERUM: Methylmalonic Acid: 154 nmol/L (ref 0–378)

## 2022-12-31 LAB — INTRINSIC FACTOR ANTIBODIES: Intrinsic Factor Abs, Serum: 0.9 [AU]/ml (ref 0.0–1.1)

## 2022-12-31 LAB — HOMOCYSTEINE: Homocysteine: 6 umol/L (ref 0.0–17.2)

## 2023-01-01 ENCOUNTER — Encounter: Payer: Self-pay | Admitting: Nurse Practitioner

## 2023-01-01 ENCOUNTER — Telehealth: Payer: BC Managed Care – PPO | Admitting: Nurse Practitioner

## 2023-01-01 VITALS — Resp 16 | Ht 66.0 in | Wt 200.0 lb

## 2023-01-01 DIAGNOSIS — K2940 Chronic atrophic gastritis without bleeding: Secondary | ICD-10-CM | POA: Diagnosis not present

## 2023-01-01 DIAGNOSIS — E538 Deficiency of other specified B group vitamins: Secondary | ICD-10-CM | POA: Diagnosis not present

## 2023-01-01 NOTE — Progress Notes (Signed)
[facility] [address] [place], [place] [address]  Internal MEDICINE  Telephone Visit  Patient Name: Danielle Wallace  [id number]  [id number]  Date of Service: 01/01/2023  I connected with the patient at 1300 by telephone and verified the patients identity using two identifiers.   I discussed the limitations, risks, security and privacy concerns of performing an evaluation and management service by telephone and the availability of in person appointments. I also discussed with the patient that there may be a patient responsible charge related to the service.  The patient expressed understanding and agrees to proceed.    Chief Complaint  Patient presents with   Telephone Assessment   Telephone Screen    Review labs    HPI Keshawn presents for a telehealth virtual visit for lab results Lab results discussed with the patient. Most of the las were normal including the B12 level but she has been supplementing for that. Current diff diagnosis still remains as chronic atrophic gastritis due to persistent clinical presentation of the patient fitting with the diagnostic criteria. Prior to supplementation, she has had a persistent B12 deficiency as well.  She plans to discuss this with her GI specialist at her next office visit with them.    Current Medication: Outpatient Encounter Medications as of 01/01/2023  Medication Sig   albuterol (VENTOLIN HFA) 108 (90 Base) MCG/ACT inhaler TAKE 2 PUFFS BY MOUTH EVERY 6 HOURS AS NEEDED FOR WHEEZE OR SHORTNESS OF BREATH   aspirin EC 81 MG tablet Take 1 tablet (81 mg total) by mouth daily. Swallow whole.   azithromycin (ZITHROMAX) 250 MG tablet Take 1 tablet (250 mg total) by mouth daily.   B COMPLEX-C PO Take by mouth daily.   Budeson-Glycopyrrol-Formoterol (BREZTRI AEROSPHERE) 160-9-4.8 MCG/ACT AERO USE 2 INHALATIONS TWICE A DAY   busPIRone (BUSPAR) 10 MG tablet Take 5 mg by mouth 2 (two) times daily.   chlorpheniramine-HYDROcodone  (TUSSIONEX) 10-8 MG/5ML Take 5 mLs by mouth every 12 (twelve) hours as needed for cough.   diphenhydrAMINE (BENADRYL) 25 MG tablet Take 25 mg by mouth every 6 (six) hours as needed.   DULoxetine (CYMBALTA) 20 MG capsule Take 1 capsule (20 mg total) by mouth daily.   EPINEPHrine (EPIPEN 2-PAK) 0.3 mg/0.3 mL IJ SOAJ injection Inject 0.3 mLs (0.3 mg total) into the skin as needed.   fluticasone (FLONASE) 50 MCG/ACT nasal spray Place 1 spray into both nostrils daily.   furosemide (LASIX) 20 MG tablet Take 20 mg by mouth daily.   glucose blood (ONETOUCH VERIO) test strip Blood sugar testing done QD and as needed  E11.65   insulin degludec (TRESIBA FLEXTOUCH) 100 UNIT/ML FlexTouch Pen INJECT 40 UNITS UNDER THE SKIN DAILY IN THE EVENING. RAISE DOSE BY 2 UNITS EVERY 3 DAYS TIL BLOOD SUGAR IS 130 OR BELOW   Insulin Pen Needle (BD PEN NEEDLE NANO 2ND GEN) 32G X 4 MM MISC USE AS DIRECTED TO INJECT TRESIBA DAILY UNDER THE SKIN   lactulose (CHRONULAC) 10 GM/15ML solution TAKE 30 ML TWICE A DAY FOR CONSTIPATION AT A TIME   LINZESS 290 MCG CAPS capsule TAKE 1 CAPSULE BY MOUTH EVERY DAY BEFORE BREAKFAST   predniSONE (STERAPRED UNI-PAK 21 TAB) 10 MG (21) TBPK tablet Use as directed for 6 days   propranolol (INDERAL) 10 MG tablet Take 1 tablet (10 mg total) by mouth 3 (three) times daily. If bp is greater than 140   propranolol ER (INDERAL LA) 60 MG 24 hr capsule TAKE 1 CAPSULE  BY MOUTH EVERY DAY   Semaglutide, 1 MG/DOSE, (OZEMPIC, 1 MG/DOSE,) 4 MG/3ML SOPN INJECT 1 MG UNDER THE SKIN ONCE A WEEK   spironolactone (ALDACTONE) 25 MG tablet Take 1 tablet (25 mg total) by mouth 2 (two) times daily.   tamoxifen (NOLVADEX) 10 MG tablet TAKE 1 TABLET DAILY   traMADol (ULTRAM) 50 MG tablet TAKE 1 TABLET (50 MG TOTAL) BY MOUTH 2 (TWO) TIMES DAILY AS NEEDED FOR MODERATE PAIN OR SEVERE PAIN.   No facility-administered encounter medications on file as of 01/01/2023.    Surgical History: Past Surgical History:  Procedure  Laterality Date   BREAST BIOPSY Right 2005   negative   BREAST BIOPSY Left 08/23/2007   negative   BREAST BIOPSY Left 10/24/2013   positive   BREAST BIOPSY Left 07/2002   neg   BREAST BIOPSY Left 08/23/2007   neg   BREAST SURGERY Left 10/2011   Cyst Aspirationapocrine metaplasia, ductal cells and bone cells, hypo-cellular   BREAST SURGERY Left 2009   ADH on stereotactic biopsy, 1.5 mm focus.   BREAST SURGERY Left 2003   fibrocystic changes with ductal hyperplasia without atypia.   BREAST SURGERY Left    mastectomy   BREAST SURGERY Left April 11, 2014   Removal of implant, debridement chest wall Dr.Coan   CESAREAN SECTION  1991   CHOLECYSTECTOMY  1990   COLONOSCOPY  08/26/2013   Lutricia Feil, M.D. normal.   CRYOABLATION  2005, 2010   CYST REMOVAL NECK  10/2011   Dr. Jenne Campus   ESOPHAGOGASTRODUODENOSCOPY (EGD) WITH PROPOFOL N/A 01/16/2017   Procedure: ESOPHAGOGASTRODUODENOSCOPY (EGD) WITH PROPOFOL;  Surgeon: Midge Minium, MD;  Location: [facility];  Service: Gastroenterology;  Laterality: N/A;  Diabetic - oral meds   INCISION AND DRAINAGE Left Dec 2015, Feb 2016   Dr Meriam Sprague   MASTECTOMY Left 11/24/2013   positive   PORTACATH PLACEMENT  11/24/13    Medical History: Past Medical History:  Diagnosis Date   Arthritis 2006   Asthma    Breast cancer (HCC) 10/14/2013   18 mm, T1c, N0; ER/ PR positive,her 2 neu overexpressed. Adjuvant chemo/ herceptin.   Cancer [facility]) 2006   Renal cell carcinoma; cryosurgery treatment, right side   Cirrhosis (HCC)    Collapsed lung 2007   Diabetes mellitus without complication (HCC) 2006   Metformin   Diffuse cystic mastopathy    Family history of breast cancer    Family history of liver cancer    Motion sickness    any moving vehicle   Orthodontics    braces   Personal history of chemotherapy    Sinus problem     Family History: Family History  Problem Relation Age of Onset   Liver cancer Father    Hemochromatosis Father    Liver  disease Father    Diabetes Mother    Hypertension Mother    Breast cancer Paternal Aunt 56   Ovarian cancer Maternal Grandmother        unk primary, metastatic cancer   Hemachromatosis Daughter    Liver cancer Paternal Uncle    Hemochromatosis Paternal Uncle     Social History   Socioeconomic History   Marital status: Married    Spouse name: Not on file   Number of children: Not on file   Years of education: Not on file   Highest education level: Not on file  Occupational History   Not on file  Tobacco Use   Smoking status: Never   Smokeless  tobacco: Never  Vaping Use   Vaping status: Never Used  Substance and Sexual Activity   Alcohol use: Not Currently    Comment: occasionally, none recently   Drug use: No   Sexual activity: Not on file  Other Topics Concern   Not on file  Social History Narrative   Not on file   Social Determinants of Health   Financial Resource Strain: Not on file  Food Insecurity: Not on file  Transportation Needs: Not on file  Physical Activity: Not on file  Stress: Not on file  Social Connections: Not on file  Intimate Partner Violence: Not on file      Review of Systems  Constitutional:  Positive for fatigue and unexpected weight change. Negative for fever.       No sense of taste or smell  HENT:  Negative for congestion, mouth sores and postnasal drip.        Difficulty hearing   Respiratory: Negative.  Negative for cough, chest tightness, shortness of breath and wheezing.   Cardiovascular:  Positive for leg swelling. Negative for chest pain and palpitations.  Gastrointestinal:  Positive for abdominal distention and abdominal pain.  Genitourinary:  Negative for flank pain.  Musculoskeletal:  Positive for arthralgias and back pain.  Skin:        Subcutaneous nodules scattered   Neurological:  Positive for weakness and numbness.  Psychiatric/Behavioral:  Positive for behavioral problems and sleep disturbance. Negative for  self-injury and suicidal ideas. The patient is nervous/anxious.     Vital Signs: Resp 16   Ht 5\' 6"  (1.676 m)   Wt 200 lb (90.7 kg)   BMI 32.28 kg/m    Observation/Objective: She is alert and oriented. No acute distress noted.     Assessment/Plan: 1. Chronic atrophic gastritis without bleeding Patient will contact her GI specialist to discuss this concern. No referral needed at this time.   2. B12 deficiency Persistent and currently supplementing   General Counseling: Mikeal Hawthorne understanding of the findings of today's phone visit and agrees with plan of treatment. I have discussed any further diagnostic evaluation that may be needed or ordered today. We also reviewed her medications today. she has been encouraged to call the office with any questions or concerns that should arise related to todays visit.  Return if symptoms worsen or fail to improve, for and keep regular follow up as scheduled .   No orders of the defined types were placed in this encounter.   No orders of the defined types were placed in this encounter.   Time spent:10 Minutes Time spent with patient included reviewing progress notes, labs, imaging studies, and discussing plan for follow up.  [place] Controlled Substance Database was reviewed by me for overdose risk score (ORS) if appropriate.  This patient was seen by Sallyanne Kuster, FNP-C in collaboration with Dr. Beverely Risen as a part of collaborative care agreement.  Ottie Neglia R. Tedd Sias, MSN, FNP-C Internal medicine

## 2023-01-04 ENCOUNTER — Other Ambulatory Visit: Payer: Self-pay | Admitting: Nurse Practitioner

## 2023-01-16 ENCOUNTER — Other Ambulatory Visit: Payer: Self-pay | Admitting: Medical Genetics

## 2023-01-16 DIAGNOSIS — Z006 Encounter for examination for normal comparison and control in clinical research program: Secondary | ICD-10-CM

## 2023-01-17 ENCOUNTER — Encounter: Payer: Self-pay | Admitting: Nurse Practitioner

## 2023-01-18 ENCOUNTER — Encounter: Payer: Self-pay | Admitting: Nurse Practitioner

## 2023-01-22 ENCOUNTER — Ambulatory Visit: Payer: BC Managed Care – PPO | Admitting: Internal Medicine

## 2023-01-26 ENCOUNTER — Encounter: Payer: Self-pay | Admitting: Internal Medicine

## 2023-01-26 ENCOUNTER — Ambulatory Visit: Payer: BC Managed Care – PPO | Admitting: Internal Medicine

## 2023-01-26 VITALS — BP 120/74 | HR 83 | Temp 98.2°F | Resp 16 | Ht 66.0 in | Wt 205.8 lb

## 2023-01-26 DIAGNOSIS — M7918 Myalgia, other site: Secondary | ICD-10-CM

## 2023-01-26 DIAGNOSIS — G4733 Obstructive sleep apnea (adult) (pediatric): Secondary | ICD-10-CM | POA: Diagnosis not present

## 2023-01-26 NOTE — Patient Instructions (Signed)

## 2023-01-26 NOTE — Progress Notes (Signed)
[facility] [address] [place], [place] [address]  Pulmonary Sleep Medicine   Office Visit Note  Patient Name: Danielle Wallace DOB: [birth year] MRN [id number]  Date of Service: 01/26/2023  Complaints/HPI: She is doing well with her CPAP as prescribed. She states she has no cough no congestion. She denies having chest pain. She feels fresh in the mornings. She has no headaches noted. She does have BP issues being addressed by her PCP  Office Spirometry Results:     ROS  General: (-) fever, (-) chills, (-) night sweats, (-) weakness Skin: (-) rashes, (-) itching,. Eyes: (-) visual changes, (-) redness, (-) itching. Nose and Sinuses: (-) nasal stuffiness or itchiness, (-) postnasal drip, (-) nosebleeds, (-) sinus trouble. Mouth and Throat: (-) sore throat, (-) hoarseness. Neck: (-) swollen glands, (-) enlarged thyroid, (-) neck pain. Respiratory: - cough, (-) bloody sputum, - shortness of breath, - wheezing. Cardiovascular: - ankle swelling, (-) chest pain. Lymphatic: (-) lymph node enlargement. Neurologic: (-) numbness, (-) tingling. Psychiatric: (-) anxiety, (-) depression   Current Medication: Outpatient Encounter Medications as of 01/26/2023  Medication Sig   albuterol (VENTOLIN HFA) 108 (90 Base) MCG/ACT inhaler TAKE 2 PUFFS BY MOUTH EVERY 6 HOURS AS NEEDED FOR WHEEZE OR SHORTNESS OF BREATH   aspirin EC 81 MG tablet Take 1 tablet (81 mg total) by mouth daily. Swallow whole.   azithromycin (ZITHROMAX) 250 MG tablet Take 1 tablet (250 mg total) by mouth daily.   B COMPLEX-C PO Take by mouth daily.   Budeson-Glycopyrrol-Formoterol (BREZTRI AEROSPHERE) 160-9-4.8 MCG/ACT AERO USE 2 INHALATIONS TWICE A DAY   busPIRone (BUSPAR) 10 MG tablet Take 5 mg by mouth 2 (two) times daily.   chlorpheniramine-HYDROcodone (TUSSIONEX) 10-8 MG/5ML Take 5 mLs by mouth every 12 (twelve) hours as needed for cough.   diphenhydrAMINE (BENADRYL) 25 MG tablet Take 25 mg by mouth every  6 (six) hours as needed.   DULoxetine (CYMBALTA) 20 MG capsule Take 1 capsule (20 mg total) by mouth daily.   EPINEPHrine (EPIPEN 2-PAK) 0.3 mg/0.3 mL IJ SOAJ injection Inject 0.3 mLs (0.3 mg total) into the skin as needed.   fluticasone (FLONASE) 50 MCG/ACT nasal spray Place 1 spray into both nostrils daily.   furosemide (LASIX) 20 MG tablet Take 20 mg by mouth daily.   glucose blood (ONETOUCH VERIO) test strip Blood sugar testing done QD and as needed  E11.65   insulin degludec (TRESIBA FLEXTOUCH) 100 UNIT/ML FlexTouch Pen INJECT 40 UNITS UNDER THE SKIN DAILY IN THE EVENING. RAISE DOSE BY 2 UNITS EVERY 3 DAYS TIL BLOOD SUGAR IS 130 OR BELOW   Insulin Pen Needle (BD PEN NEEDLE NANO 2ND GEN) 32G X 4 MM MISC USE AS DIRECTED TO INJECT TRESIBA DAILY UNDER THE SKIN   lactulose (CHRONULAC) 10 GM/15ML solution TAKE 30 ML TWICE A DAY FOR CONSTIPATION AT A TIME   LINZESS 290 MCG CAPS capsule TAKE 1 CAPSULE BY MOUTH EVERY DAY BEFORE BREAKFAST   predniSONE (STERAPRED UNI-PAK 21 TAB) 10 MG (21) TBPK tablet Use as directed for 6 days   propranolol (INDERAL) 10 MG tablet Take 1 tablet (10 mg total) by mouth 3 (three) times daily. If bp is greater than 140   propranolol ER (INDERAL LA) 60 MG 24 hr capsule TAKE 1 CAPSULE BY MOUTH EVERY DAY   Semaglutide, 1 MG/DOSE, (OZEMPIC, 1 MG/DOSE,) 4 MG/3ML SOPN INJECT 1 MG UNDER THE SKIN ONCE A WEEK   spironolactone (ALDACTONE) 25 MG tablet Take 1 tablet (  25 mg total) by mouth 2 (two) times daily.   tamoxifen (NOLVADEX) 10 MG tablet TAKE 1 TABLET DAILY   traMADol (ULTRAM) 50 MG tablet TAKE 1 TABLET (50 MG TOTAL) BY MOUTH 2 (TWO) TIMES DAILY AS NEEDED FOR MODERATE PAIN OR SEVERE PAIN.   No facility-administered encounter medications on file as of 01/26/2023.    Surgical History: Past Surgical History:  Procedure Laterality Date   BREAST BIOPSY Right 2005   negative   BREAST BIOPSY Left 08/23/2007   negative   BREAST BIOPSY Left 10/24/2013   positive   BREAST BIOPSY  Left 07/2002   neg   BREAST BIOPSY Left 08/23/2007   neg   BREAST SURGERY Left 10/2011   Cyst Aspirationapocrine metaplasia, ductal cells and bone cells, hypo-cellular   BREAST SURGERY Left 2009   ADH on stereotactic biopsy, 1.5 mm focus.   BREAST SURGERY Left 2003   fibrocystic changes with ductal hyperplasia without atypia.   BREAST SURGERY Left    mastectomy   BREAST SURGERY Left April 11, 2014   Removal of implant, debridement chest wall Dr.Coan   CESAREAN SECTION  1991   CHOLECYSTECTOMY  1990   COLONOSCOPY  08/26/2013   Lutricia Feil, M.D. normal.   CRYOABLATION  2005, 2010   CYST REMOVAL NECK  10/2011   Dr. Jenne Campus   ESOPHAGOGASTRODUODENOSCOPY (EGD) WITH PROPOFOL N/A 01/16/2017   Procedure: ESOPHAGOGASTRODUODENOSCOPY (EGD) WITH PROPOFOL;  Surgeon: Midge Minium, MD;  Location: [facility];  Service: Gastroenterology;  Laterality: N/A;  Diabetic - oral meds   INCISION AND DRAINAGE Left Dec 2015, Feb 2016   Dr Meriam Sprague   MASTECTOMY Left 11/24/2013   positive   PORTACATH PLACEMENT  11/24/13    Medical History: Past Medical History:  Diagnosis Date   Arthritis 2006   Asthma    Breast cancer (HCC) 10/14/2013   18 mm, T1c, N0; ER/ PR positive,her 2 neu overexpressed. Adjuvant chemo/ herceptin.   Cancer [facility]) 2006   Renal cell carcinoma; cryosurgery treatment, right side   Cirrhosis (HCC)    Collapsed lung 2007   Diabetes mellitus without complication (HCC) 2006   Metformin   Diffuse cystic mastopathy    Family history of breast cancer    Family history of liver cancer    Motion sickness    any moving vehicle   Orthodontics    braces   Personal history of chemotherapy    Sinus problem     Family History: Family History  Problem Relation Age of Onset   Liver cancer Father    Hemochromatosis Father    Liver disease Father    Diabetes Mother    Hypertension Mother    Breast cancer Paternal Aunt 46   Ovarian cancer Maternal Grandmother        unk primary, metastatic  cancer   Hemachromatosis Daughter    Liver cancer Paternal Uncle    Hemochromatosis Paternal Uncle     Social History: Social History   Socioeconomic History   Marital status: Married    Spouse name: Not on file   Number of children: Not on file   Years of education: Not on file   Highest education level: Not on file  Occupational History   Not on file  Tobacco Use   Smoking status: Never   Smokeless tobacco: Never  Vaping Use   Vaping status: Never Used  Substance and Sexual Activity   Alcohol use: Not Currently    Comment: occasionally, none recently   Drug  use: No   Sexual activity: Not on file  Other Topics Concern   Not on file  Social History Narrative   Not on file   Social Determinants of Health   Financial Resource Strain: Not on file  Food Insecurity: Not on file  Transportation Needs: Not on file  Physical Activity: Not on file  Stress: Not on file  Social Connections: Not on file  Intimate Partner Violence: Not on file    Vital Signs: Blood pressure 120/74, pulse 83, temperature 98.2 F (36.8 C), resp. rate 16, height 5\' 6"  (1.676 m), weight 205 lb 12.8 oz (93.4 kg), SpO2 98%.  Examination: General Appearance: The patient is well-developed, well-nourished, and in no distress. Skin: Gross inspection of skin unremarkable. Head: normocephalic, no gross deformities. Eyes: no gross deformities noted. ENT: ears appear grossly normal no exudates. Neck: Supple. No thyromegaly. No LAD. Respiratory: no rhonchi noted. Cardiovascular: Normal S1 and S2 without murmur or rub. Extremities: No cyanosis. pulses are equal. Neurologic: Alert and oriented. No involuntary movements.  LABS: Recent Results (from the past 2160 hour(s))  Lupus (SLE) Analysis     Status: Abnormal   Collection Time: 12/17/22  8:07 AM  Result Value Ref Range   Complement C4, Serum 17 12 - 38 mg/dL   Anti Nuclear Antibody (ANA) Negative Negative   dsDNA Ab 1 0 - 9 IU/mL    Comment:                                     Negative      <5                                    Equivocal  5 - 9                                    Positive      >9    Parietal Cell Ab 27.3 (H) 0.0 - 20.0 Units    Comment:                                 Negative    0.0 - 20.0                                 Equivocal  20.1 - 24.9                                 Positive         >24.9 Parietal Cell Antibodies are found in 90% of patients with pernicious anemia and 30% of first degree relatives with pernicious anemia.    ENA RNP Ab 0.3 0.0 - 0.9 AI   ENA SM Ab Ser-aCnc <0.2 0.0 - 0.9 AI   Smooth Muscle Ab 6 0 - 19 Units    Comment:                  Negative                     0 - 19  Weak positive               20 - 30                  Moderate to strong positive     >30  Actin Antibodies are found in 52-85% of patients with  autoimmune hepatitis or chronic active hepatitis and  in 22% of patients with primary biliary cirrhosis.    Mitochondrial Ab <20.0 0.0 - 20.0 Units    Comment:                                 Negative    0.0 - 20.0                                 Equivocal  20.1 - 24.9                                 Positive         >24.9 Mitochondrial (M2) Antibodies are found in 90-96% of patients with primary biliary cirrhosis.    Scleroderma (Scl-70) (ENA) Antibody, IgG <0.2 0.0 - 0.9 AI   ENA SSA (RO) Ab 0.2 0.0 - 0.9 AI   ENA SSB (LA) Ab <0.2 0.0 - 0.9 AI   Anti-striation Abs Negative Neg:<1:100   Thyroperoxidase Ab SerPl-aCnc <9 0 - 34 IU/mL  B12 and Folate Panel     Status: None   Collection Time: 12/25/22  9:54 AM  Result Value Ref Range   Vitamin B-12 777 232 - 1,245 pg/mL   Folate >20.0 >3.0 ng/mL    Comment: A serum folate concentration of less than 3.1 ng/mL is considered to represent clinical deficiency.   TSH + free T4     Status: None   Collection Time: 12/25/22  9:54 AM  Result Value Ref Range   TSH 1.790 0.450 - 4.500 uIU/mL   Free T4 1.04  0.82 - 1.77 ng/dL  CBC with Differential/Platelet     Status: None   Collection Time: 12/25/22  9:54 AM  Result Value Ref Range   WBC 5.2 3.4 - 10.8 x10E3/uL   RBC 4.57 3.77 - 5.28 x10E6/uL   Hemoglobin 14.0 11.1 - 15.9 g/dL   Hematocrit 16.1 09.6 - 46.6 %   MCV 92 79 - 97 fL   MCH 30.6 26.6 - 33.0 pg   MCHC 33.3 31.5 - 35.7 g/dL   RDW 04.5 40.9 - 81.1 %   Platelets 169 150 - 450 x10E3/uL   Neutrophils 50 Not Estab. %   Lymphs 37 Not Estab. %   Monocytes 10 Not Estab. %   Eos 2 Not Estab. %   Basos 1 Not Estab. %   Neutrophils Absolute 2.6 1.4 - 7.0 x10E3/uL   Lymphocytes Absolute 1.9 0.7 - 3.1 x10E3/uL   Monocytes Absolute 0.5 0.1 - 0.9 x10E3/uL   EOS (ABSOLUTE) 0.1 0.0 - 0.4 x10E3/uL   Basophils Absolute 0.0 0.0 - 0.2 x10E3/uL   Immature Granulocytes 0 Not Estab. %   Immature Grans (Abs) 0.0 0.0 - 0.1 x10E3/uL  Iron, TIBC and Ferritin Panel     Status: None   Collection Time: 12/25/22  9:54 AM  Result Value Ref Range   Total Iron Binding Capacity 315 250 - 450 ug/dL  UIBC 210 118 - 369 ug/dL   Iron 409 27 - 811 ug/dL   Iron Saturation 33 15 - 55 %   Ferritin 73 15 - 150 ng/mL  Hgb A1C w/o eAG     Status: Abnormal   Collection Time: 12/25/22  9:54 AM  Result Value Ref Range   Hgb A1c MFr Bld 6.9 (H) 4.8 - 5.6 %    Comment:          Prediabetes: 5.7 - 6.4          Diabetes: >6.4          Glycemic control for adults with diabetes: <7.0   Methylmalonic acid, serum     Status: None   Collection Time: 12/25/22  9:54 AM  Result Value Ref Range   Methylmalonic Acid 154 0 - 378 nmol/L  Homocysteine     Status: None   Collection Time: 12/25/22  9:54 AM  Result Value Ref Range   Homocysteine 6.0 0.0 - 17.2 umol/L  Intrinsic Factor Antibodies     Status: None   Collection Time: 12/25/22  9:54 AM  Result Value Ref Range   Intrinsic Factor Abs, Serum 0.9 0.0 - 1.1 AU/mL    Radiology: DG Pelvis 1-2 Views  Result Date: 08/27/2022 CLINICAL DATA:  Polyarticular arthritis.   Pain. EXAM: PELVIS - 1-2 VIEW COMPARISON:  CT abdomen and pelvis 12/23/2021 FINDINGS: The bilateral sacroiliac joint spaces are maintained. Mild-to-moderate bilateral femoroacetabular joint space narrowing with moderate superolateral degenerative osteophytes. Mild-to-moderate pubic symphysis joint space narrowing and peripheral osteophytosis No acute fracture or dislocation. Vascular phleboliths overlie the pelvis. IMPRESSION: Mild-to-moderate bilateral femoroacetabular and pubic symphysis osteoarthritis. Electronically Signed   By: Neita Garnet M.D.   On: 08/27/2022 13:17   DG Lumbar Spine 2-3 Views  Result Date: 08/27/2022 CLINICAL DATA:  Polyarticular arthritis. Midline lower back and posterior pelvis pain. EXAM: LUMBAR SPINE - 2-3 VIEW COMPARISON:  CT abdomen pelvis 12/23/2021 FINDINGS: There are 5 non-rib-bearing lumbar-type vertebral bodies. Vertebral body heights are maintained. Moderate posterior L4-5 and mild posterior L5-S1 disc space narrowing with endplate sclerosis. Degenerative Schmorl's nodes at the anterior inferior L4 endplate, similar to prior CT. 3 mm retrolisthesis of L4 on L5, unchanged. Vertebral body heights are maintained. Mild anterior L1-2 through L4-5 endplate osteophytes. Moderate L5-S1 facet joint hypertrophy. Moderate atherosclerotic calcification. Vascular phleboliths overlie the pelvis. IMPRESSION: 1. Moderate L4-5 and mild L5-S1 degenerative disc changes. 2. Moderate L5-S1 facet joint hypertrophy. 3. Mild retrolisthesis of L4 on L5. Electronically Signed   By: Neita Garnet M.D.   On: 08/27/2022 13:15   DG Hand 2 View Left  Result Date: 08/27/2022 CLINICAL DATA:  Polyarticular arthritis. Bilateral hand interphalangeal joint pain. Third metacarpal head pain. EXAM: LEFT HAND - 2 VIEW COMPARISON:  None Available. FINDINGS: Severe second and third DIP joint space narrowing and peripheral osteophytosis with mild central erosions as can be seen with erosive osteoarthritis. There  is complete osseous fusion of the fourth and fifth DIP joints as seen within the contralateral hand imaged the same day. Moderate to severe thumb interphalangeal and second through fifth PIP joint space narrowing and peripheral osteophytosis. Moderate to severe thumb carpometacarpal joint space narrowing with subchondral sclerosis, articular surface irregularity, and mild peripheral osteophytosis. Mild-to-moderate triscaphe joint space narrowing. No acute fracture or dislocation. IMPRESSION: 1. Severe second and third DIP joint space narrowing with mild central erosions as can be seen with erosive osteoarthritis. 2. Moderate to severe thumb carpometacarpal, thumb interphalangeal, and second through  fifth PIP joint osteoarthritis. Electronically Signed   By: Neita Garnet M.D.   On: 08/27/2022 13:12   DG Hand 2 View Right  Result Date: 08/27/2022 CLINICAL DATA:  Polyarticular arthritis. No known injury. Bilateral hand interphalangeal joint pain. Third metacarpal pain. EXAM: RIGHT HAND - 2 VIEW COMPARISON:  None Available. FINDINGS: Normal bone mineralization. Severe thumb carpometacarpal and second and third DIP joint space narrowing and bone-on-bone contact, subchondral sclerosis, and moderate peripheral osteophytosis. There is complete osseous fusion of fourth and fifth DIP joints. Moderate to severe second through fifth PIP joint space narrowing. Mild-to-moderate volar osteophytosis at the index finger metacarpal head. Minimal diffuse metacarpophalangeal joint space narrowing. Moderate thumb carpometacarpal joint space narrowing with moderate trapezium articular surface cortical irregularity and moderate-to-large peripheral osteophytes. Moderate severe triscaphe joint space narrowing. No acute fracture or dislocation. No cortical erosion or periostitis. IMPRESSION: 1. Arthritis in a distribution consistent with osteoarthritis greatest within the thumb carpometacarpal, second and third DIP, and triscaphe  joints. 2. Complete osseous fusion of the fourth and fifth DIP joints. Electronically Signed   By: Neita Garnet M.D.   On: 08/27/2022 13:10    No results found.  No results found.  Assessment and Plan: Patient Active Problem List   Diagnosis Date Noted   Chronic liver disease and cirrhosis (HCC) 09/06/2022   Restless leg syndrome 07/07/2022   OSA on CPAP 03/03/2022   CPAP use counseling 03/03/2022   Complex tear of medial meniscus of right knee as current injury 03/04/2020   Aortic atherosclerosis (HCC) 03/04/2020   Osteoarthritis of knee 03/02/2020   Encounter for general adult medical examination with abnormal findings 01/31/2020   Primary osteoarthritis of both knees 01/31/2020   Degenerative disc disease, lumbar 01/31/2020   Genetic testing 11/16/2019   Family history of breast cancer    Family history of liver cancer    Multinodular goiter 10/10/2019   Thyromegaly 09/19/2019   Allergic reaction 07/01/2019   Breast cancer of upper-outer quadrant of left female breast (HCC) 10/27/2018   Routine cervical smear 10/19/2018   Uncontrolled type 2 diabetes mellitus with hyperglycemia (HCC) 10/19/2018   Gastroesophageal reflux disease with esophagitis 10/19/2018   Stomatitis and mucositis 10/19/2018   Urinary tract infection without hematuria 10/19/2018   Dysuria 10/19/2018   Need for vaccination against Streptococcus pneumoniae using pneumococcal conjugate vaccine 13 04/21/2018   Acute bronchitis 10/01/2017   Wheezing 10/01/2017   Sore throat 10/01/2017   Fatigue 09/23/2017   Vitamin D deficiency 09/23/2017   Anemia 09/23/2017   Acute non-recurrent pansinusitis 08/12/2017   Type 2 diabetes mellitus with hyperglycemia, with long-term current use of insulin (HCC) 08/12/2017   Epstein Barr virus infection 08/12/2017   Essential hypertension 05/18/2017   Abnormal CT scan, stomach    Gastric varices    Cellulitis of female breast 12/11/2016   Open wound of breast 12/11/2016    Abdominal distention 01/23/2016   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 12/12/2014   Nausea 09/22/2014   EN (erythema nodosum) 07/26/2014   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  OSA (obstructive sleep apnea) Encouraged ongoing compliance she has been using the CPAP and has actually done fairly well.  Will continue with supportive care.  2. Obesity, morbid (HCC) Diet exercise weight loss was discussed we will continue to monitor along closely.  3. Myalgia, multiple sites Needs follow-up with her primary team regarding the myalgias.  General Counseling: I have discussed the findings of the evaluation and examination with Elon Jester.  I have also discussed any further diagnostic evaluation thatmay be needed or ordered today. Taliah verbalizes understanding of the findings of todays visit. We also reviewed her medications today and discussed drug interactions and side effects including but not limited excessive drowsiness and altered mental states. We also discussed that there is always a risk not just to her but also people around her. she has been encouraged to call the office with any questions or concerns that should arise related to todays visit.  No orders of the defined types were placed in this encounter.    Time spent: 29  I have personally obtained a history, examined the patient, evaluated laboratory and imaging results, formulated the assessment and plan and placed orders.    Yevonne Pax, MD [facility] Pulmonary and  Critical Care Sleep medicine

## 2023-01-27 ENCOUNTER — Encounter: Payer: Self-pay | Admitting: Nurse Practitioner

## 2023-01-27 ENCOUNTER — Ambulatory Visit (INDEPENDENT_AMBULATORY_CARE_PROVIDER_SITE_OTHER): Payer: BC Managed Care – PPO | Admitting: Nurse Practitioner

## 2023-01-27 ENCOUNTER — Telehealth: Payer: Self-pay | Admitting: Nurse Practitioner

## 2023-01-27 VITALS — BP 120/78 | HR 90 | Temp 98.3°F | Resp 16 | Ht 66.0 in | Wt 206.6 lb

## 2023-01-27 DIAGNOSIS — Z0001 Encounter for general adult medical examination with abnormal findings: Secondary | ICD-10-CM

## 2023-01-27 DIAGNOSIS — E1165 Type 2 diabetes mellitus with hyperglycemia: Secondary | ICD-10-CM

## 2023-01-27 DIAGNOSIS — G5693 Unspecified mononeuropathy of bilateral upper limbs: Secondary | ICD-10-CM | POA: Diagnosis not present

## 2023-01-27 DIAGNOSIS — R300 Dysuria: Secondary | ICD-10-CM

## 2023-01-27 DIAGNOSIS — Z1231 Encounter for screening mammogram for malignant neoplasm of breast: Secondary | ICD-10-CM

## 2023-01-27 DIAGNOSIS — K2940 Chronic atrophic gastritis without bleeding: Secondary | ICD-10-CM | POA: Diagnosis not present

## 2023-01-27 DIAGNOSIS — M7918 Myalgia, other site: Secondary | ICD-10-CM

## 2023-01-27 DIAGNOSIS — Z23 Encounter for immunization: Secondary | ICD-10-CM

## 2023-01-27 DIAGNOSIS — G5793 Unspecified mononeuropathy of bilateral lower limbs: Secondary | ICD-10-CM

## 2023-01-27 DIAGNOSIS — Z79899 Other long term (current) drug therapy: Secondary | ICD-10-CM

## 2023-01-27 DIAGNOSIS — M1990 Unspecified osteoarthritis, unspecified site: Secondary | ICD-10-CM

## 2023-01-27 DIAGNOSIS — F320 Major depressive disorder, single episode, mild: Secondary | ICD-10-CM

## 2023-01-27 DIAGNOSIS — Z794 Long term (current) use of insulin: Secondary | ICD-10-CM

## 2023-01-27 MED ORDER — BUSPIRONE HCL 10 MG PO TABS
10.0000 mg | ORAL_TABLET | Freq: Two times a day (BID) | ORAL | 3 refills | Status: DC
Start: 2023-01-27 — End: 2023-03-17

## 2023-01-27 MED ORDER — ALBUTEROL SULFATE HFA 108 (90 BASE) MCG/ACT IN AERS
1.0000 | INHALATION_SPRAY | RESPIRATORY_TRACT | 5 refills | Status: DC | PRN
Start: 2023-01-27 — End: 2023-05-16

## 2023-01-27 MED ORDER — TRESIBA FLEXTOUCH 100 UNIT/ML ~~LOC~~ SOPN
28.0000 [IU] | PEN_INJECTOR | Freq: Every day | SUBCUTANEOUS | Status: DC
Start: 2023-01-27 — End: 2023-05-16

## 2023-01-27 MED ORDER — PROPRANOLOL HCL ER 60 MG PO CP24: 60.0000 mg | ORAL_CAPSULE | Freq: Every day | ORAL | 1 refills | Status: DC

## 2023-01-27 MED ORDER — SPIRONOLACTONE 25 MG PO TABS
25.0000 mg | ORAL_TABLET | Freq: Two times a day (BID) | ORAL | 1 refills | Status: DC
Start: 2023-01-27 — End: 2023-05-16

## 2023-01-27 MED ORDER — DEXCOM G7 SENSOR MISC
11 refills | Status: DC
Start: 2023-01-27 — End: 2023-05-16

## 2023-01-27 MED ORDER — FUROSEMIDE 20 MG PO TABS
20.0000 mg | ORAL_TABLET | Freq: Every day | ORAL | 3 refills | Status: DC
Start: 2023-01-27 — End: 2023-03-17

## 2023-01-27 MED ORDER — TRAMADOL HCL 50 MG PO TABS
50.0000 mg | ORAL_TABLET | Freq: Two times a day (BID) | ORAL | 0 refills | Status: DC | PRN
Start: 2023-01-27 — End: 2023-05-07

## 2023-01-27 MED ORDER — DULOXETINE HCL 20 MG PO CPEP
20.0000 mg | ORAL_CAPSULE | Freq: Every day | ORAL | 2 refills | Status: DC
Start: 2023-01-27 — End: 2023-05-16

## 2023-01-27 NOTE — Telephone Encounter (Signed)
Awaiting 01/27/23 office notes for Neurology referral-Toni

## 2023-01-27 NOTE — Progress Notes (Signed)
[facility] [address] [place], [place] [address]  Internal MEDICINE  Office Visit Note  Patient Name: Danielle Wallace  [id number]  [id number]  Date of Service: 01/27/2023  Chief Complaint  Patient presents with   Diabetes   Annual Exam    HPI Alona presents for an annual well visit and physical exam.  Well-appearing 62 y.o. female with cirrhosis, hypertension, aortic atherosclerosis, OSA on CPAP, GERD, diabetes, osteoarthritis, chronic atrophic gastritis, and history of anemia and history of breast and kidney cancers. Routine CRC screening: due in 2025 Routine mammogram: due in late November this year  DEXA scan: done in 2023, was completely normal  Pap smear: due 2025 Eye exam: done in August this year   foot exam: done today.  Labs: done previously and already discussed  New or worsening pain: none  Gets persistent numbness in extremities, lower more than upper extremities  Stopped tamoxifen after 9 years due to pain   Current Medication: Outpatient Encounter Medications as of 01/27/2023  Medication Sig   aspirin EC 81 MG tablet Take 1 tablet (81 mg total) by mouth daily. Swallow whole.   B COMPLEX-C PO Take by mouth daily.   Continuous Glucose Sensor (DEXCOM G7 SENSOR) MISC Apply 1 sensor to the skin every 10 days to monitor glucose for diabetes.   diphenhydrAMINE (BENADRYL) 25 MG tablet Take 25 mg by mouth every 6 (six) hours as needed.   fluticasone (FLONASE) 50 MCG/ACT nasal spray Place 1 spray into both nostrils daily.   glucose blood (ONETOUCH VERIO) test strip Blood sugar testing done QD and as needed  E11.65   Insulin Pen Needle (BD PEN NEEDLE NANO 2ND GEN) 32G X 4 MM MISC USE AS DIRECTED TO INJECT TRESIBA DAILY UNDER THE SKIN   lactulose (CHRONULAC) 10 GM/15ML solution TAKE 30 ML TWICE A DAY FOR CONSTIPATION AT A TIME   LINZESS 290 MCG CAPS capsule TAKE 1 CAPSULE BY MOUTH EVERY DAY BEFORE BREAKFAST   propranolol (INDERAL) 10 MG tablet Take 1  tablet (10 mg total) by mouth 3 (three) times daily. If bp is greater than 140   Semaglutide, 1 MG/DOSE, (OZEMPIC, 1 MG/DOSE,) 4 MG/3ML SOPN INJECT 1 MG UNDER THE SKIN ONCE A WEEK   [DISCONTINUED] albuterol (VENTOLIN HFA) 108 (90 Base) MCG/ACT inhaler TAKE 2 PUFFS BY MOUTH EVERY 6 HOURS AS NEEDED FOR WHEEZE OR SHORTNESS OF BREATH   [DISCONTINUED] azithromycin (ZITHROMAX) 250 MG tablet Take 1 tablet (250 mg total) by mouth daily.   [DISCONTINUED] Budeson-Glycopyrrol-Formoterol (BREZTRI AEROSPHERE) 160-9-4.8 MCG/ACT AERO USE 2 INHALATIONS TWICE A DAY   [DISCONTINUED] busPIRone (BUSPAR) 10 MG tablet Take 5 mg by mouth 2 (two) times daily.   [DISCONTINUED] chlorpheniramine-HYDROcodone (TUSSIONEX) 10-8 MG/5ML Take 5 mLs by mouth every 12 (twelve) hours as needed for cough.   [DISCONTINUED] DULoxetine (CYMBALTA) 20 MG capsule Take 1 capsule (20 mg total) by mouth daily.   [DISCONTINUED] furosemide (LASIX) 20 MG tablet Take 20 mg by mouth daily.   [DISCONTINUED] insulin degludec (TRESIBA FLEXTOUCH) 100 UNIT/ML FlexTouch Pen INJECT 40 UNITS UNDER THE SKIN DAILY IN THE EVENING. RAISE DOSE BY 2 UNITS EVERY 3 DAYS TIL BLOOD SUGAR IS 130 OR BELOW   [DISCONTINUED] predniSONE (STERAPRED UNI-PAK 21 TAB) 10 MG (21) TBPK tablet Use as directed for 6 days   [DISCONTINUED] propranolol ER (INDERAL LA) 60 MG 24 hr capsule TAKE 1 CAPSULE BY MOUTH EVERY DAY   [DISCONTINUED] spironolactone (ALDACTONE) 25 MG tablet Take 1 tablet (25 mg total) by  mouth 2 (two) times daily.   [DISCONTINUED] tamoxifen (NOLVADEX) 10 MG tablet TAKE 1 TABLET DAILY   [DISCONTINUED] traMADol (ULTRAM) 50 MG tablet TAKE 1 TABLET (50 MG TOTAL) BY MOUTH 2 (TWO) TIMES DAILY AS NEEDED FOR MODERATE PAIN OR SEVERE PAIN.   albuterol (VENTOLIN HFA) 108 (90 Base) MCG/ACT inhaler Inhale 1-2 puffs into the lungs every 4 (four) hours as needed for wheezing or shortness of breath.   busPIRone (BUSPAR) 10 MG tablet Take 1 tablet (10 mg total) by mouth 2 (two) times  daily.   DULoxetine (CYMBALTA) 20 MG capsule Take 1 capsule (20 mg total) by mouth daily.   furosemide (LASIX) 20 MG tablet Take 1 tablet (20 mg total) by mouth daily.   insulin degludec (TRESIBA FLEXTOUCH) 100 UNIT/ML FlexTouch Pen Inject 28 Units into the skin daily.   propranolol ER (INDERAL LA) 60 MG 24 hr capsule Take 1 capsule (60 mg total) by mouth daily.   spironolactone (ALDACTONE) 25 MG tablet Take 1 tablet (25 mg total) by mouth 2 (two) times daily.   traMADol (ULTRAM) 50 MG tablet Take 1 tablet (50 mg total) by mouth 2 (two) times daily as needed for moderate pain or severe pain.   [DISCONTINUED] EPINEPHrine (EPIPEN 2-PAK) 0.3 mg/0.3 mL IJ SOAJ injection Inject 0.3 mLs (0.3 mg total) into the skin as needed.   No facility-administered encounter medications on file as of 01/27/2023.    Surgical History: Past Surgical History:  Procedure Laterality Date   BREAST BIOPSY Right 2005   negative   BREAST BIOPSY Left 08/23/2007   negative   BREAST BIOPSY Left 10/24/2013   positive   BREAST BIOPSY Left 07/2002   neg   BREAST BIOPSY Left 08/23/2007   neg   BREAST SURGERY Left 10/2011   Cyst Aspirationapocrine metaplasia, ductal cells and bone cells, hypo-cellular   BREAST SURGERY Left 2009   ADH on stereotactic biopsy, 1.5 mm focus.   BREAST SURGERY Left 2003   fibrocystic changes with ductal hyperplasia without atypia.   BREAST SURGERY Left    mastectomy   BREAST SURGERY Left April 11, 2014   Removal of implant, debridement chest wall Dr.Coan   CESAREAN SECTION  1991   CHOLECYSTECTOMY  1990   COLONOSCOPY  08/26/2013   Lutricia Feil, M.D. normal.   CRYOABLATION  2005, 2010   CYST REMOVAL NECK  10/2011   Dr. Jenne Campus   ESOPHAGOGASTRODUODENOSCOPY (EGD) WITH PROPOFOL N/A 01/16/2017   Procedure: ESOPHAGOGASTRODUODENOSCOPY (EGD) WITH PROPOFOL;  Surgeon: Midge Minium, MD;  Location: [facility];  Service: Gastroenterology;  Laterality: N/A;  Diabetic - oral meds   INCISION AND  DRAINAGE Left Dec 2015, Feb 2016   Dr Meriam Sprague   MASTECTOMY Left 11/24/2013   positive   PORTACATH PLACEMENT  11/24/13    Medical History: Past Medical History:  Diagnosis Date   Arthritis 2006   Asthma    Breast cancer (HCC) 10/14/2013   18 mm, T1c, N0; ER/ PR positive,her 2 neu overexpressed. Adjuvant chemo/ herceptin.   Cancer [facility]) 2006   Renal cell carcinoma; cryosurgery treatment, right side   Cirrhosis (HCC)    Collapsed lung 2007   Diabetes mellitus without complication (HCC) 2006   Metformin   Diffuse cystic mastopathy    Family history of breast cancer    Family history of liver cancer    Motion sickness    any moving vehicle   Orthodontics    braces   Personal history of chemotherapy    Sinus  problem     Family History: Family History  Problem Relation Age of Onset   Liver cancer Father    Hemochromatosis Father    Liver disease Father    Diabetes Mother    Hypertension Mother    Breast cancer Paternal Aunt 20   Ovarian cancer Maternal Grandmother        unk primary, metastatic cancer   Hemachromatosis Daughter    Liver cancer Paternal Uncle    Hemochromatosis Paternal Uncle     Social History   Socioeconomic History   Marital status: Married    Spouse name: Not on file   Number of children: Not on file   Years of education: Not on file   Highest education level: Not on file  Occupational History   Not on file  Tobacco Use   Smoking status: Never   Smokeless tobacco: Never  Vaping Use   Vaping status: Never Used  Substance and Sexual Activity   Alcohol use: Not Currently    Comment: occasionally, none recently   Drug use: No   Sexual activity: Not on file  Other Topics Concern   Not on file  Social History Narrative   Not on file   Social Determinants of Health   Financial Resource Strain: Not on file  Food Insecurity: Not on file  Transportation Needs: Not on file  Physical Activity: Not on file  Stress: Not on file  Social  Connections: Not on file  Intimate Partner Violence: Not on file      Review of Systems  Constitutional:  Positive for fatigue and unexpected weight change. Negative for activity change, appetite change, chills, diaphoresis and fever.       No sense of taste or smell  HENT:  Negative for congestion, ear discharge, ear pain, facial swelling, hearing loss, mouth sores, nosebleeds, postnasal drip, rhinorrhea, sinus pressure, sinus pain, sneezing, sore throat, tinnitus, trouble swallowing and voice change.        Difficulty hearing   Eyes:  Negative for photophobia, pain, discharge, redness, itching and visual disturbance.  Respiratory: Negative.  Negative for apnea, cough, choking, chest tightness, shortness of breath, wheezing and stridor.   Cardiovascular:  Positive for leg swelling. Negative for chest pain and palpitations.  Gastrointestinal:  Positive for abdominal distention and abdominal pain. Negative for anal bleeding, constipation, diarrhea, nausea, rectal pain and vomiting.  Endocrine: Negative for cold intolerance, heat intolerance, polydipsia, polyphagia and polyuria.  Genitourinary:  Negative for difficulty urinating, dysuria, flank pain, frequency, genital sores, hematuria, menstrual problem, pelvic pain, urgency, vaginal bleeding, vaginal discharge and vaginal pain.  Musculoskeletal:  Positive for arthralgias and back pain. Negative for gait problem, joint swelling, myalgias and neck pain.  Skin:  Negative for color change, pallor, rash and wound.       Subcutaneous nodules scattered   Allergic/Immunologic: Negative for environmental allergies, food allergies and immunocompromised state.  Neurological:  Positive for weakness and numbness. Negative for dizziness, tremors, seizures, syncope, facial asymmetry, speech difficulty, light-headedness and headaches.  Hematological:  Negative for adenopathy. Does not bruise/bleed easily.  Psychiatric/Behavioral:  Positive for behavioral  problems and sleep disturbance. Negative for agitation, confusion, decreased concentration, dysphoric mood, hallucinations, self-injury and suicidal ideas. The patient is nervous/anxious. The patient is not hyperactive.     Vital Signs: BP 120/78   Pulse 90   Temp 98.3 F (36.8 C)   Resp 16   Ht 5\' 6"  (1.676 m)   Wt 206 lb 9.6 oz (93.7 kg)  SpO2 96%   BMI 33.35 kg/m    Physical Exam Vitals reviewed.  Constitutional:      General: She is not in acute distress.    Appearance: Normal appearance. She is well-developed. She is obese. She is not ill-appearing or diaphoretic.  HENT:     Head: Normocephalic and atraumatic.     Right Ear: External ear normal.     Left Ear: External ear normal.     Nose: Nose normal.     Mouth/Throat:     Mouth: Mucous membranes are moist.     Pharynx: No oropharyngeal exudate or posterior oropharyngeal erythema.  Eyes:     General: No scleral icterus.       Right eye: No discharge.        Left eye: No discharge.     Extraocular Movements: Extraocular movements intact.     Conjunctiva/sclera: Conjunctivae normal.     Pupils: Pupils are equal, round, and reactive to light.  Neck:     Thyroid: No thyromegaly.     Vascular: No JVD.     Trachea: No tracheal deviation.  Cardiovascular:     Rate and Rhythm: Normal rate and regular rhythm.     Pulses: Normal pulses.     Heart sounds: Normal heart sounds. No murmur heard.    No friction rub. No gallop.  Pulmonary:     Effort: Pulmonary effort is normal. No respiratory distress.     Breath sounds: Normal breath sounds. No stridor. No wheezing or rales.  Chest:     Chest wall: No tenderness.  Abdominal:     General: Bowel sounds are normal. There is distension.     Palpations: Abdomen is soft. There is fluid wave. There is no mass.     Tenderness: There is no abdominal tenderness. There is no guarding or rebound.  Musculoskeletal:        General: Swelling (generalized) present. No tenderness or  deformity. Normal range of motion.     Cervical back: Normal range of motion and neck supple.     Right foot: Normal range of motion.     Left foot: Normal range of motion.  Feet:     Right foot:     Protective Sensation: 2 sites tested.  2 sites sensed.     Skin integrity: Skin integrity normal.     Toenail Condition: Right toenails are normal.     Left foot:     Protective Sensation: 2 sites tested.  2 sites sensed.     Skin integrity: Skin integrity normal.     Toenail Condition: Left toenails are normal.  Lymphadenopathy:     Cervical: No cervical adenopathy.  Skin:    General: Skin is warm and dry.     Capillary Refill: Capillary refill takes less than 2 seconds.     Coloration: Skin is not pale.     Findings: No erythema or rash.     Comments: Palpated some of the scatter subcutaneous nodules.  Neurological:     General: No focal deficit present.     Mental Status: She is alert and oriented to person, place, and time.     Cranial Nerves: No cranial nerve deficit.     Motor: No abnormal muscle tone.     Coordination: Coordination normal.     Deep Tendon Reflexes: Reflexes are normal and symmetric.  Psychiatric:        Mood and Affect: Mood normal.  Behavior: Behavior normal.        Thought Content: Thought content normal.        Judgment: Judgment normal.        Assessment/Plan: 1. Encounter for routine adult health examination with abnormal findings Age-appropriate preventive screenings and vaccinations discussed, annual physical exam completed. Routine labs for health maintenance done in August and previously discussed with patient. PHM updated.   2. Type 2 diabetes mellitus with hyperglycemia, with long-term current use of insulin (HCC) Continue medications as prescribed, refills ordered  - Continuous Glucose Sensor (DEXCOM G7 SENSOR) MISC; Apply 1 sensor to the skin every 10 days to monitor glucose for diabetes.  Dispense: 3 each; Refill: 11 - insulin  degludec (TRESIBA FLEXTOUCH) 100 UNIT/ML FlexTouch Pen; Inject 28 Units into the skin daily.  3. Chronic atrophic gastritis without bleeding Will discuss further with GI specialist at her upcoming visit.   4. Neuropathy of both upper extremities Referred to neurology - Ambulatory referral to Neurology  5. Neuropathy involving both lower extremities Referred to neurology - Ambulatory referral to Neurology  6. Dysuria Routine urinalysis done  - UA/M w/rflx Culture, Routine - Microscopic Examination  7. Encounter for screening mammogram for malignant neoplasm of breast Routine mammogram ordered - MM 3D SCREENING MAMMOGRAM BILATERAL BREAST; Future  8. Need for vaccination Flu vaccine administered in office today  - Influenza, MDCK, trivalent, PF(Flucelvax egg-free)  9. Encounter for medication review Medication list reviewed, updated and refills ordered  - albuterol (VENTOLIN HFA) 108 (90 Base) MCG/ACT inhaler; Inhale 1-2 puffs into the lungs every 4 (four) hours as needed for wheezing or shortness of breath.  Dispense: 6.7 each; Refill: 5 - propranolol ER (INDERAL LA) 60 MG 24 hr capsule; Take 1 capsule (60 mg total) by mouth daily.  Dispense: 90 capsule; Refill: 1 - traMADol (ULTRAM) 50 MG tablet; Take 1 tablet (50 mg total) by mouth 2 (two) times daily as needed for moderate pain or severe pain.  Dispense: 180 tablet; Refill: 0 - busPIRone (BUSPAR) 10 MG tablet; Take 1 tablet (10 mg total) by mouth 2 (two) times daily.  Dispense: 180 tablet; Refill: 3 - spironolactone (ALDACTONE) 25 MG tablet; Take 1 tablet (25 mg total) by mouth 2 (two) times daily.  Dispense: 180 tablet; Refill: 1 - furosemide (LASIX) 20 MG tablet; Take 1 tablet (20 mg total) by mouth daily.  Dispense: 90 tablet; Refill: 3 - DULoxetine (CYMBALTA) 20 MG capsule; Take 1 capsule (20 mg total) by mouth daily.  Dispense: 90 capsule; Refill: 2       General Counseling: Emmaley verbalizes understanding of the  findings of todays visit and agrees with plan of treatment. I have discussed any further diagnostic evaluation that may be needed or ordered today. We also reviewed her medications today. she has been encouraged to call the office with any questions or concerns that should arise related to todays visit.    Orders Placed This Encounter  Procedures   Microscopic Examination   MM 3D SCREENING MAMMOGRAM BILATERAL BREAST   Influenza, MDCK, trivalent, PF(Flucelvax egg-free)   UA/M w/rflx Culture, Routine   Ambulatory referral to Neurology    Meds ordered this encounter  Medications   albuterol (VENTOLIN HFA) 108 (90 Base) MCG/ACT inhaler    Sig: Inhale 1-2 puffs into the lungs every 4 (four) hours as needed for wheezing or shortness of breath.    Dispense:  6.7 each    Refill:  5   Continuous Glucose Sensor (DEXCOM G7 SENSOR) MISC  Sig: Apply 1 sensor to the skin every 10 days to monitor glucose for diabetes.    Dispense:  3 each    Refill:  11    Dx code E11.65, is on insulin.   insulin degludec (TRESIBA FLEXTOUCH) 100 UNIT/ML FlexTouch Pen    Sig: Inject 28 Units into the skin daily.   propranolol ER (INDERAL LA) 60 MG 24 hr capsule    Sig: Take 1 capsule (60 mg total) by mouth daily.    Dispense:  90 capsule    Refill:  1   traMADol (ULTRAM) 50 MG tablet    Sig: Take 1 tablet (50 mg total) by mouth 2 (two) times daily as needed for moderate pain or severe pain.    Dispense:  180 tablet    Refill:  0    Not to exceed 5 additional fills before 12/24/2022   busPIRone (BUSPAR) 10 MG tablet    Sig: Take 1 tablet (10 mg total) by mouth 2 (two) times daily.    Dispense:  180 tablet    Refill:  3   spironolactone (ALDACTONE) 25 MG tablet    Sig: Take 1 tablet (25 mg total) by mouth 2 (two) times daily.    Dispense:  180 tablet    Refill:  1   furosemide (LASIX) 20 MG tablet    Sig: Take 1 tablet (20 mg total) by mouth daily.    Dispense:  90 tablet    Refill:  3   DULoxetine  (CYMBALTA) 20 MG capsule    Sig: Take 1 capsule (20 mg total) by mouth daily.    Dispense:  90 capsule    Refill:  2    Patient never received refill that was send on 05/07/22 was unable to contact you in the app since there is no way of reporting a missing prescription or undelivered. Please send refill ASAP, about to run out    Return in about 6 weeks (around 03/10/2023) for F/U, Standley Bargo PCP.   Total time spent:30 Minutes Time spent includes review of chart, medications, test results, and follow up plan with the patient.   [place] Controlled Substance Database was reviewed by me.  This patient was seen by Sallyanne Kuster, FNP-C in collaboration with Dr. Beverely Risen as a part of collaborative care agreement.  Chuckie Mccathern R. Tedd Sias, MSN, FNP-C Internal medicine

## 2023-01-29 ENCOUNTER — Ambulatory Visit: Payer: BC Managed Care – PPO | Admitting: Internal Medicine

## 2023-02-09 ENCOUNTER — Ambulatory Visit (INDEPENDENT_AMBULATORY_CARE_PROVIDER_SITE_OTHER): Payer: BC Managed Care – PPO | Admitting: Nurse Practitioner

## 2023-02-09 ENCOUNTER — Telehealth: Payer: Self-pay

## 2023-02-09 ENCOUNTER — Encounter: Payer: Self-pay | Admitting: Nurse Practitioner

## 2023-02-09 VITALS — BP 138/77 | HR 80 | Temp 98.1°F | Resp 16 | Ht 66.0 in | Wt 204.0 lb

## 2023-02-09 DIAGNOSIS — R52 Pain, unspecified: Secondary | ICD-10-CM

## 2023-02-09 DIAGNOSIS — M62838 Other muscle spasm: Secondary | ICD-10-CM

## 2023-02-09 DIAGNOSIS — R6889 Other general symptoms and signs: Secondary | ICD-10-CM

## 2023-02-09 MED ORDER — CARISOPRODOL 350 MG PO TABS: 350.0000 mg | ORAL_TABLET | Freq: Four times a day (QID) | ORAL | 0 refills | Status: DC | PRN

## 2023-02-09 MED ORDER — AZITHROMYCIN 250 MG PO TABS
250.0000 mg | ORAL_TABLET | Freq: Every day | ORAL | 0 refills | Status: AC
Start: 2023-02-09 — End: 2023-02-19

## 2023-02-09 NOTE — Progress Notes (Unsigned)
[facility] [address] [place], [place] [address]  Internal MEDICINE  Office Visit Note  Patient Name: Danielle Wallace  [id number]  [id number]  Date of Service: 02/09/2023  Chief Complaint  Patient presents with   Acute Visit    Flu shot allergic reaction      HPI Peg presents for an acute sick visit for fever and flu like symptoms  --onset was last Friday about 4 days ago. Woke up hurting and feeling bad. Received flu vaccine on 01/27/2023 Reports severe body aches everywhere at first. Fatigue and lethargy on Saturday and achy  High blood glucose over the weekend. High heart rate and high BP Pain with breathing, headache Negative for flu A and B Possible delayed reaction to the flu vaccine or secondary bacterial infection.     Current Medication:  Outpatient Encounter Medications as of 02/09/2023  Medication Sig   albuterol (VENTOLIN HFA) 108 (90 Base) MCG/ACT inhaler Inhale 1-2 puffs into the lungs every 4 (four) hours as needed for wheezing or shortness of breath.   aspirin EC 81 MG tablet Take 1 tablet (81 mg total) by mouth daily. Swallow whole.   azithromycin (ZITHROMAX) 250 MG tablet Take 1 tablet (250 mg total) by mouth daily for 10 days. Take with food   B COMPLEX-C PO Take by mouth daily.   baclofen (LIORESAL) 10 MG tablet Take by mouth.   busPIRone (BUSPAR) 10 MG tablet Take 1 tablet (10 mg total) by mouth 2 (two) times daily.   carisoprodol (SOMA) 350 MG tablet Take 1 tablet (350 mg total) by mouth 4 (four) times daily as needed for muscle spasms.   Continuous Glucose Sensor (DEXCOM G7 SENSOR) MISC Apply 1 sensor to the skin every 10 days to monitor glucose for diabetes.   diphenhydrAMINE (BENADRYL) 25 MG tablet Take 25 mg by mouth every 6 (six) hours as needed.   DULoxetine (CYMBALTA) 20 MG capsule Take 1 capsule (20 mg total) by mouth daily.   fluticasone (FLONASE) 50 MCG/ACT nasal spray Place 1 spray into both nostrils daily.   furosemide  (LASIX) 20 MG tablet Take 1 tablet (20 mg total) by mouth daily.   glucose blood (ONETOUCH VERIO) test strip Blood sugar testing done QD and as needed  E11.65   insulin degludec (TRESIBA FLEXTOUCH) 100 UNIT/ML FlexTouch Pen Inject 28 Units into the skin daily.   Insulin Pen Needle (BD PEN NEEDLE NANO 2ND GEN) 32G X 4 MM MISC USE AS DIRECTED TO INJECT TRESIBA DAILY UNDER THE SKIN   lactulose (CHRONULAC) 10 GM/15ML solution TAKE 30 ML TWICE A DAY FOR CONSTIPATION AT A TIME   LINZESS 290 MCG CAPS capsule TAKE 1 CAPSULE BY MOUTH EVERY DAY BEFORE BREAKFAST   propranolol (INDERAL) 10 MG tablet Take 1 tablet (10 mg total) by mouth 3 (three) times daily. If bp is greater than 140   propranolol ER (INDERAL LA) 60 MG 24 hr capsule Take 1 capsule (60 mg total) by mouth daily.   Semaglutide, 1 MG/DOSE, (OZEMPIC, 1 MG/DOSE,) 4 MG/3ML SOPN INJECT 1 MG UNDER THE SKIN ONCE A WEEK   spironolactone (ALDACTONE) 25 MG tablet Take 1 tablet (25 mg total) by mouth 2 (two) times daily.   traMADol (ULTRAM) 50 MG tablet Take 1 tablet (50 mg total) by mouth 2 (two) times daily as needed for moderate pain or severe pain.   No facility-administered encounter medications on file as of 02/09/2023.      Medical History: Past Medical History:  Diagnosis Date   Arthritis 2006   Asthma    Breast cancer (HCC) 10/14/2013   18 mm, T1c, N0; ER/ PR positive,her 2 neu overexpressed. Adjuvant chemo/ herceptin.   Cancer [facility]) 2006   Renal cell carcinoma; cryosurgery treatment, right side   Cirrhosis (HCC)    Collapsed lung 2007   Diabetes mellitus without complication (HCC) 2006   Metformin   Diffuse cystic mastopathy    Family history of breast cancer    Family history of liver cancer    Motion sickness    any moving vehicle   Orthodontics    braces   Personal history of chemotherapy    Sinus problem      Vital Signs: BP 138/77   Pulse 80   Temp 98.1 F (36.7 C)   Resp 16   Ht 5\' 6"  (1.676 m)   Wt 204 lb (92.5  kg)   SpO2 99%   BMI 32.93 kg/m    Review of Systems  Constitutional:  Positive for activity change, appetite change, fatigue and fever.  HENT:  Positive for congestion, postnasal drip, rhinorrhea, sinus pressure, sinus pain, sneezing and sore throat.   Respiratory:  Positive for chest tightness. Negative for cough, shortness of breath and wheezing.   Cardiovascular: Negative.  Negative for chest pain and palpitations.  Gastrointestinal:  Positive for diarrhea and nausea.  Musculoskeletal:  Positive for arthralgias, back pain and myalgias.  Neurological:  Positive for weakness.    Physical Exam Vitals reviewed.  Constitutional:      Appearance: Normal appearance. She is obese. She is ill-appearing.  HENT:     Head: Normocephalic and atraumatic.  Eyes:     Pupils: Pupils are equal, round, and reactive to light.  Cardiovascular:     Rate and Rhythm: Normal rate and regular rhythm.     Heart sounds: Normal heart sounds. No murmur heard. Pulmonary:     Effort: Pulmonary effort is normal. No respiratory distress.     Breath sounds: Normal breath sounds. No wheezing.  Neurological:     Mental Status: She is alert and oriented to person, place, and time.  Psychiatric:        Mood and Affect: Mood normal.        Behavior: Behavior normal.       Assessment/Plan: 1. Flu-like symptoms Negative for flu, zpak prescribed to mitigate any developing infection as patient is prone to severe infections - POCT Influenza A/B - azithromycin (ZITHROMAX) 250 MG tablet; Take 1 tablet (250 mg total) by mouth daily for 10 days. Take with food  Dispense: 10 tablet; Refill: 0  2. Generalized body aches Negative for flu, zpak and soma prescribed  - POCT Influenza A/B - azithromycin (ZITHROMAX) 250 MG tablet; Take 1 tablet (250 mg total) by mouth daily for 10 days. Take with food  Dispense: 10 tablet; Refill: 0 - carisoprodol (SOMA) 350 MG tablet; Take 1 tablet (350 mg total) by mouth 4 (four)  times daily as needed for muscle spasms.  Dispense: 60 tablet; Refill: 0  3. Muscle spasm of left lower extremity Muscle relaxant prescribed to relieve painful muscle spasm - carisoprodol (SOMA) 350 MG tablet; Take 1 tablet (350 mg total) by mouth 4 (four) times daily as needed for muscle spasms.  Dispense: 60 tablet; Refill: 0   General Counseling: Shayanna verbalizes understanding of the findings of todays visit and agrees with plan of treatment. I have discussed any further diagnostic evaluation that may be needed or ordered today.  We also reviewed her medications today. she has been encouraged to call the office with any questions or concerns that should arise related to todays visit.    Counseling:    Orders Placed This Encounter  Procedures   POCT Influenza A/B    Meds ordered this encounter  Medications   azithromycin (ZITHROMAX) 250 MG tablet    Sig: Take 1 tablet (250 mg total) by mouth daily for 10 days. Take with food    Dispense:  10 tablet    Refill:  0   carisoprodol (SOMA) 350 MG tablet    Sig: Take 1 tablet (350 mg total) by mouth 4 (four) times daily as needed for muscle spasms.    Dispense:  60 tablet    Refill:  0    Fill new script now, patient is holding baclofen for now    Return if symptoms worsen or fail to improve.   Controlled Substance Database was reviewed by me for overdose risk score (ORS)  Time spent:30 Minutes Time spent with patient included reviewing progress notes, labs, imaging studies, and discussing plan for follow up.   This patient was seen by Sallyanne Kuster, FNP-C in collaboration with Dr. Beverely Risen as a part of collaborative care agreement.  Aizley Stenseth R. Tedd Sias, MSN, FNP-C Internal Medicine

## 2023-02-09 NOTE — Telephone Encounter (Signed)
Patient called stated she had a allergic reaction to the flu vaccine, very weak, fever 101 at night, sugars all over the place. Patient will be seen this afternoon in office.

## 2023-02-10 ENCOUNTER — Encounter: Payer: Self-pay | Admitting: Nurse Practitioner

## 2023-02-10 LAB — POCT INFLUENZA A/B
Influenza A, POC: NEGATIVE
Influenza B, POC: NEGATIVE

## 2023-02-16 ENCOUNTER — Encounter: Payer: Self-pay | Admitting: Nurse Practitioner

## 2023-02-16 ENCOUNTER — Telehealth (INDEPENDENT_AMBULATORY_CARE_PROVIDER_SITE_OTHER): Payer: BC Managed Care – PPO | Admitting: Nurse Practitioner

## 2023-02-16 VITALS — BP 127/81 | HR 83 | Temp 98.0°F | Resp 16 | Ht 66.0 in | Wt 203.0 lb

## 2023-02-16 DIAGNOSIS — J069 Acute upper respiratory infection, unspecified: Secondary | ICD-10-CM

## 2023-02-16 MED ORDER — PREDNISONE 10 MG (21) PO TBPK: ORAL_TABLET | ORAL | 0 refills | Status: DC

## 2023-02-16 MED ORDER — CEFDINIR 300 MG PO CAPS
300.0000 mg | ORAL_CAPSULE | Freq: Two times a day (BID) | ORAL | 0 refills | Status: AC
Start: 2023-02-16 — End: 2023-02-26

## 2023-02-16 NOTE — Progress Notes (Signed)
[facility] [address] [place], [place] [address]  Internal MEDICINE  Telephone Visit  Patient Name: Danielle Wallace  [id number]  [id number]  Date of Service: 02/16/2023  I connected with the patient at 1300 by telephone and verified the patients identity using two identifiers.   I discussed the limitations, risks, security and privacy concerns of performing an evaluation and management service by telephone and the availability of in person appointments. I also discussed with the patient that there may be a patient responsible charge related to the service.  The patient expressed understanding and agrees to proceed.    Chief Complaint  Patient presents with   Telephone Screen    Still having bad cough, light headed, little SOB. Blood sugar high    Telephone Assessment    HPI Danielle Wallace presents for a telehealth virtual visit for cough and SOB Reports chest tightness, SOB, cough and wheezing.  Was recently treated with zpak which did not improve much. Symptoms have worsened.     Current Medication: Outpatient Encounter Medications as of 02/16/2023  Medication Sig   albuterol (VENTOLIN HFA) 108 (90 Base) MCG/ACT inhaler Inhale 1-2 puffs into the lungs every 4 (four) hours as needed for wheezing or shortness of breath.   aspirin EC 81 MG tablet Take 1 tablet (81 mg total) by mouth daily. Swallow whole.   azithromycin (ZITHROMAX) 250 MG tablet Take 1 tablet (250 mg total) by mouth daily for 10 days. Take with food   B COMPLEX-C PO Take by mouth daily.   baclofen (LIORESAL) 10 MG tablet Take by mouth.   busPIRone (BUSPAR) 10 MG tablet Take 1 tablet (10 mg total) by mouth 2 (two) times daily.   carisoprodol (SOMA) 350 MG tablet Take 1 tablet (350 mg total) by mouth 4 (four) times daily as needed for muscle spasms.   cefdinir (OMNICEF) 300 MG capsule Take 1 capsule (300 mg total) by mouth 2 (two) times daily for 10 days. Take with food   Continuous Glucose Sensor (DEXCOM G7  SENSOR) MISC Apply 1 sensor to the skin every 10 days to monitor glucose for diabetes.   diphenhydrAMINE (BENADRYL) 25 MG tablet Take 25 mg by mouth every 6 (six) hours as needed.   DULoxetine (CYMBALTA) 20 MG capsule Take 1 capsule (20 mg total) by mouth daily.   fluticasone (FLONASE) 50 MCG/ACT nasal spray Place 1 spray into both nostrils daily.   furosemide (LASIX) 20 MG tablet Take 1 tablet (20 mg total) by mouth daily.   glucose blood (ONETOUCH VERIO) test strip Blood sugar testing done QD and as needed  E11.65   insulin degludec (TRESIBA FLEXTOUCH) 100 UNIT/ML FlexTouch Pen Inject 28 Units into the skin daily.   Insulin Pen Needle (BD PEN NEEDLE NANO 2ND GEN) 32G X 4 MM MISC USE AS DIRECTED TO INJECT TRESIBA DAILY UNDER THE SKIN   lactulose (CHRONULAC) 10 GM/15ML solution TAKE 30 ML TWICE A DAY FOR CONSTIPATION AT A TIME   LINZESS 290 MCG CAPS capsule TAKE 1 CAPSULE BY MOUTH EVERY DAY BEFORE BREAKFAST   predniSONE (STERAPRED UNI-PAK 21 TAB) 10 MG (21) TBPK tablet Use as directed for 6 days   propranolol (INDERAL) 10 MG tablet Take 1 tablet (10 mg total) by mouth 3 (three) times daily. If bp is greater than 140   propranolol ER (INDERAL LA) 60 MG 24 hr capsule Take 1 capsule (60 mg total) by mouth daily.   Semaglutide, 1 MG/DOSE, (OZEMPIC, 1 MG/DOSE,) 4 MG/3ML SOPN INJECT  1 MG UNDER THE SKIN ONCE A WEEK   spironolactone (ALDACTONE) 25 MG tablet Take 1 tablet (25 mg total) by mouth 2 (two) times daily.   traMADol (ULTRAM) 50 MG tablet Take 1 tablet (50 mg total) by mouth 2 (two) times daily as needed for moderate pain or severe pain.   No facility-administered encounter medications on file as of 02/16/2023.    Surgical History: Past Surgical History:  Procedure Laterality Date   BREAST BIOPSY Right 2005   negative   BREAST BIOPSY Left 08/23/2007   negative   BREAST BIOPSY Left 10/24/2013   positive   BREAST BIOPSY Left 07/2002   neg   BREAST BIOPSY Left 08/23/2007   neg   BREAST  SURGERY Left 10/2011   Cyst Aspirationapocrine metaplasia, ductal cells and bone cells, hypo-cellular   BREAST SURGERY Left 2009   ADH on stereotactic biopsy, 1.5 mm focus.   BREAST SURGERY Left 2003   fibrocystic changes with ductal hyperplasia without atypia.   BREAST SURGERY Left    mastectomy   BREAST SURGERY Left April 11, 2014   Removal of implant, debridement chest wall Dr.Coan   CESAREAN SECTION  1991   CHOLECYSTECTOMY  1990   COLONOSCOPY  08/26/2013   Lutricia Feil, M.D. normal.   CRYOABLATION  2005, 2010   CYST REMOVAL NECK  10/2011   Dr. Jenne Campus   ESOPHAGOGASTRODUODENOSCOPY (EGD) WITH PROPOFOL N/A 01/16/2017   Procedure: ESOPHAGOGASTRODUODENOSCOPY (EGD) WITH PROPOFOL;  Surgeon: Midge Minium, MD;  Location: [facility];  Service: Gastroenterology;  Laterality: N/A;  Diabetic - oral meds   INCISION AND DRAINAGE Left Dec 2015, Feb 2016   Dr Meriam Sprague   MASTECTOMY Left 11/24/2013   positive   PORTACATH PLACEMENT  11/24/13    Medical History: Past Medical History:  Diagnosis Date   Arthritis 2006   Asthma    Breast cancer (HCC) 10/14/2013   18 mm, T1c, N0; ER/ PR positive,her 2 neu overexpressed. Adjuvant chemo/ herceptin.   Cancer [facility]) 2006   Renal cell carcinoma; cryosurgery treatment, right side   Cirrhosis (HCC)    Collapsed lung 2007   Diabetes mellitus without complication (HCC) 2006   Metformin   Diffuse cystic mastopathy    Family history of breast cancer    Family history of liver cancer    Motion sickness    any moving vehicle   Orthodontics    braces   Personal history of chemotherapy    Sinus problem     Family History: Family History  Problem Relation Age of Onset   Liver cancer Father    Hemochromatosis Father    Liver disease Father    Diabetes Mother    Hypertension Mother    Breast cancer Paternal Aunt 74   Ovarian cancer Maternal Grandmother        unk primary, metastatic cancer   Hemachromatosis Daughter    Liver cancer Paternal Uncle     Hemochromatosis Paternal Uncle     Social History   Socioeconomic History   Marital status: Married    Spouse name: Not on file   Number of children: Not on file   Years of education: Not on file   Highest education level: Not on file  Occupational History   Not on file  Tobacco Use   Smoking status: Never   Smokeless tobacco: Never  Vaping Use   Vaping status: Never Used  Substance and Sexual Activity   Alcohol use: Not Currently    Comment: occasionally,  none recently   Drug use: No   Sexual activity: Not on file  Other Topics Concern   Not on file  Social History Narrative   Not on file   Social Determinants of Health   Financial Resource Strain: Not on file  Food Insecurity: Not on file  Transportation Needs: Not on file  Physical Activity: Not on file  Stress: Not on file  Social Connections: Not on file  Intimate Partner Violence: Not on file      Review of Systems  Constitutional:  Positive for activity change, appetite change, fatigue and fever.  HENT:  Positive for congestion, postnasal drip, rhinorrhea, sinus pressure, sinus pain, sneezing and sore throat.   Respiratory:  Positive for cough, chest tightness, shortness of breath and wheezing.   Cardiovascular: Negative.  Negative for chest pain and palpitations.  Gastrointestinal:  Positive for diarrhea and nausea.  Musculoskeletal:  Positive for arthralgias, back pain and myalgias.  Neurological:  Positive for weakness.    Vital Signs: BP 127/81   Pulse 83   Temp 98 F (36.7 C)   Resp 16   Ht 5\' 6"  (1.676 m)   Wt 203 lb (92.1 kg)   SpO2 95%   BMI 32.77 kg/m    Observation/Objective: She is alert and oriented. No acute distress.    Assessment/Plan: 1. Upper respiratory tract infection, unspecified type Treat  with antibiotic and prednisone taper  - cefdinir (OMNICEF) 300 MG capsule; Take 1 capsule (300 mg total) by mouth 2 (two) times daily for 10 days. Take with food  Dispense: 20  capsule; Refill: 0 - predniSONE (STERAPRED UNI-PAK 21 TAB) 10 MG (21) TBPK tablet; Use as directed for 6 days  Dispense: 21 tablet; Refill: 0   General Counseling: Anhelica verbalizes understanding of the findings of today's phone visit and agrees with plan of treatment. I have discussed any further diagnostic evaluation that may be needed or ordered today. We also reviewed her medications today. she has been encouraged to call the office with any questions or concerns that should arise related to todays visit.  Return if symptoms worsen or fail to improve.   No orders of the defined types were placed in this encounter.   Meds ordered this encounter  Medications   cefdinir (OMNICEF) 300 MG capsule    Sig: Take 1 capsule (300 mg total) by mouth 2 (two) times daily for 10 days. Take with food    Dispense:  20 capsule    Refill:  0   predniSONE (STERAPRED UNI-PAK 21 TAB) 10 MG (21) TBPK tablet    Sig: Use as directed for 6 days    Dispense:  21 tablet    Refill:  0    Time spent:10 Minutes Time spent with patient included reviewing progress notes, labs, imaging studies, and discussing plan for follow up.  [place] Controlled Substance Database was reviewed by me for overdose risk score (ORS) if appropriate.  This patient was seen by Sallyanne Kuster, FNP-C in collaboration with Dr. Beverely Risen as a part of collaborative care agreement.  Donna Silverman R. Tedd Sias, MSN, FNP-C Internal medicine

## 2023-02-25 DIAGNOSIS — G4733 Obstructive sleep apnea (adult) (pediatric): Secondary | ICD-10-CM | POA: Diagnosis not present

## 2023-03-09 DIAGNOSIS — K7682 Hepatic encephalopathy: Secondary | ICD-10-CM | POA: Diagnosis not present

## 2023-03-09 DIAGNOSIS — J45909 Unspecified asthma, uncomplicated: Secondary | ICD-10-CM | POA: Diagnosis not present

## 2023-03-09 DIAGNOSIS — K7581 Nonalcoholic steatohepatitis (NASH): Secondary | ICD-10-CM | POA: Diagnosis not present

## 2023-03-09 DIAGNOSIS — K7689 Other specified diseases of liver: Secondary | ICD-10-CM | POA: Diagnosis not present

## 2023-03-09 DIAGNOSIS — Z148 Genetic carrier of other disease: Secondary | ICD-10-CM | POA: Diagnosis not present

## 2023-03-09 DIAGNOSIS — M810 Age-related osteoporosis without current pathological fracture: Secondary | ICD-10-CM | POA: Diagnosis not present

## 2023-03-09 DIAGNOSIS — R932 Abnormal findings on diagnostic imaging of liver and biliary tract: Secondary | ICD-10-CM | POA: Diagnosis not present

## 2023-03-09 DIAGNOSIS — I10 Essential (primary) hypertension: Secondary | ICD-10-CM | POA: Diagnosis not present

## 2023-03-09 DIAGNOSIS — K769 Liver disease, unspecified: Secondary | ICD-10-CM | POA: Diagnosis not present

## 2023-03-09 DIAGNOSIS — E119 Type 2 diabetes mellitus without complications: Secondary | ICD-10-CM | POA: Diagnosis not present

## 2023-03-09 DIAGNOSIS — Z79899 Other long term (current) drug therapy: Secondary | ICD-10-CM | POA: Diagnosis not present

## 2023-03-09 DIAGNOSIS — K7460 Unspecified cirrhosis of liver: Secondary | ICD-10-CM | POA: Diagnosis not present

## 2023-03-11 ENCOUNTER — Telehealth: Payer: Self-pay | Admitting: Nurse Practitioner

## 2023-03-11 ENCOUNTER — Encounter: Payer: Self-pay | Admitting: Nurse Practitioner

## 2023-03-11 NOTE — Telephone Encounter (Signed)
Neurology referral sent via [employer] to Dr. Sherryll Burger with [facility]. Notified patient. Gave pt telephone # [phone]

## 2023-03-12 ENCOUNTER — Telehealth: Payer: Self-pay | Admitting: Nurse Practitioner

## 2023-03-12 ENCOUNTER — Ambulatory Visit: Payer: BC Managed Care – PPO | Admitting: Nurse Practitioner

## 2023-03-12 ENCOUNTER — Encounter: Payer: Self-pay | Admitting: Nurse Practitioner

## 2023-03-12 VITALS — BP 128/76 | HR 82 | Temp 98.3°F | Resp 16 | Ht 66.0 in | Wt 207.4 lb

## 2023-03-12 DIAGNOSIS — R7981 Abnormal blood-gas level: Secondary | ICD-10-CM

## 2023-03-12 DIAGNOSIS — K769 Liver disease, unspecified: Secondary | ICD-10-CM

## 2023-03-12 DIAGNOSIS — D751 Secondary polycythemia: Secondary | ICD-10-CM

## 2023-03-12 DIAGNOSIS — K7460 Unspecified cirrhosis of liver: Secondary | ICD-10-CM

## 2023-03-12 MED ORDER — CYANOCOBALAMIN 1000 MCG/ML IJ SOLN
1000.0000 ug | Freq: Once | INTRAMUSCULAR | Status: AC
  Administered 2023-03-12: 1000 ug via INTRAMUSCULAR

## 2023-03-12 NOTE — Progress Notes (Cosign Needed)
[facility] [address] [place], [place] [address]  Internal MEDICINE  Office Visit Note  Patient Name: Danielle Wallace  [id number]  [id number]  Date of Service: 03/12/2023  Chief Complaint  Patient presents with   Diabetes   Follow-up    HPI Toia presents for a follow-up visit for high RBC level, low B12 level and brain fog.  Erythrocytosis -- concern for chronic low oxygen saturation.  B12 deficiency -- requesting B12 injection today Brain fog -- concern for chronic low oxygen saturation     Current Medication: Outpatient Encounter Medications as of 03/12/2023  Medication Sig   albuterol (VENTOLIN HFA) 108 (90 Base) MCG/ACT inhaler Inhale 1-2 puffs into the lungs every 4 (four) hours as needed for wheezing or shortness of breath.   aspirin EC 81 MG tablet Take 1 tablet (81 mg total) by mouth daily. Swallow whole.   B COMPLEX-C PO Take by mouth daily.   [EXPIRED] baclofen (LIORESAL) 10 MG tablet Take by mouth.   carisoprodol (SOMA) 350 MG tablet Take 1 tablet (350 mg total) by mouth 4 (four) times daily as needed for muscle spasms. (Patient not taking: Reported on 04/15/2023)   Continuous Glucose Sensor (DEXCOM G7 SENSOR) MISC Apply 1 sensor to the skin every 10 days to monitor glucose for diabetes.   diphenhydrAMINE (BENADRYL) 25 MG tablet Take 25 mg by mouth every 6 (six) hours as needed.   DULoxetine (CYMBALTA) 20 MG capsule Take 1 capsule (20 mg total) by mouth daily.   fluticasone (FLONASE) 50 MCG/ACT nasal spray Place 1 spray into both nostrils daily.   glucose blood (ONETOUCH VERIO) test strip Blood sugar testing done QD and as needed  E11.65   insulin degludec (TRESIBA FLEXTOUCH) 100 UNIT/ML FlexTouch Pen Inject 28 Units into the skin daily.   Insulin Pen Needle (BD PEN NEEDLE NANO 2ND GEN) 32G X 4 MM MISC USE AS DIRECTED TO INJECT TRESIBA DAILY UNDER THE SKIN   lactulose (CHRONULAC) 10 GM/15ML solution TAKE 30 ML TWICE A DAY FOR CONSTIPATION AT A TIME    LINZESS 290 MCG CAPS capsule TAKE 1 CAPSULE BY MOUTH EVERY DAY BEFORE BREAKFAST   propranolol (INDERAL) 10 MG tablet Take 1 tablet (10 mg total) by mouth 3 (three) times daily. If bp is greater than 140   propranolol ER (INDERAL LA) 60 MG 24 hr capsule Take 1 capsule (60 mg total) by mouth daily.   Semaglutide, 1 MG/DOSE, (OZEMPIC, 1 MG/DOSE,) 4 MG/3ML SOPN INJECT 1 MG UNDER THE SKIN ONCE A WEEK   spironolactone (ALDACTONE) 25 MG tablet Take 1 tablet (25 mg total) by mouth 2 (two) times daily.   [DISCONTINUED] busPIRone (BUSPAR) 10 MG tablet Take 1 tablet (10 mg total) by mouth 2 (two) times daily.   [DISCONTINUED] furosemide (LASIX) 20 MG tablet Take 1 tablet (20 mg total) by mouth daily.   [DISCONTINUED] predniSONE (STERAPRED UNI-PAK 21 TAB) 10 MG (21) TBPK tablet Use as directed for 6 days   [DISCONTINUED] traMADol (ULTRAM) 50 MG tablet Take 1 tablet (50 mg total) by mouth 2 (two) times daily as needed for moderate pain or severe pain.   [EXPIRED] cyanocobalamin (VITAMIN B12) injection 1,000 mcg    No facility-administered encounter medications on file as of 03/12/2023.    Surgical History: Past Surgical History:  Procedure Laterality Date   BREAST BIOPSY Right 2005   negative   BREAST BIOPSY Left 08/23/2007   negative   BREAST BIOPSY Left 10/24/2013   positive  BREAST BIOPSY Left 07/2002   neg   BREAST BIOPSY Left 08/23/2007   neg   BREAST SURGERY Left 10/2011   Cyst Aspirationapocrine metaplasia, ductal cells and bone cells, hypo-cellular   BREAST SURGERY Left 2009   ADH on stereotactic biopsy, 1.5 mm focus.   BREAST SURGERY Left 2003   fibrocystic changes with ductal hyperplasia without atypia.   BREAST SURGERY Left 04/11/2014   Removal of implant, debridement chest wall Dr.Coan   CESAREAN SECTION  1991   CHOLECYSTECTOMY  1990   COLONOSCOPY  08/26/2013   Lutricia Feil, M.D. normal.   CRYOABLATION  2005, 2010   CYST REMOVAL NECK  10/2011   Dr. Jenne Campus    ESOPHAGOGASTRODUODENOSCOPY (EGD) WITH PROPOFOL N/A 01/16/2017   Procedure: ESOPHAGOGASTRODUODENOSCOPY (EGD) WITH PROPOFOL;  Surgeon: Midge Minium, MD;  Location: [facility];  Service: Gastroenterology;  Laterality: N/A;  Diabetic - oral meds   INCISION AND DRAINAGE Left Dec 2015, Feb 2016   Dr Meriam Sprague   MASTECTOMY Left 11/24/2013   positive   PORTACATH PLACEMENT  11/24/2013   removal of portacath      Medical History: Past Medical History:  Diagnosis Date   Alpha-1-antitrypsin deficiency (HCC)    per patient   Arthritis 2006   Asthma    Breast cancer (HCC) 10/14/2013   18 mm, T1c, N0; ER/ PR positive,her 2 neu overexpressed. Adjuvant chemo/ herceptin.   Cancer [facility]) 2006   Renal cell carcinoma; cryosurgery treatment, right side   Cirrhosis (HCC)    Collapsed lung 2007   Diabetes mellitus without complication (HCC) 2006   Metformin   Diffuse cystic mastopathy    Family history of breast cancer    Family history of liver cancer    Motion sickness    any moving vehicle   Orthodontics    braces   Personal history of chemotherapy    Sinus problem     Family History: Family History  Problem Relation Age of Onset   Diabetes Mother    Hypertension Mother    Emphysema Mother    Aortic aneurysm Mother    Hemachromatosis Father    Liver cancer Father    Liver disease Father    Gout Brother    Diabetes Brother        passed at age 78   Breast cancer Paternal Aunt 18   Liver cancer Paternal Uncle    Hemochromatosis Paternal Uncle    Ovarian cancer Maternal Grandmother        unk primary, metastatic cancer   Hemachromatosis Daughter    Alpha-1 antitrypsin deficiency Daughter    Other Daughter        familial dominant lipodystrophy   Healthy Son    Healthy Son     Social History   Socioeconomic History   Marital status: Married    Spouse name: Not on file   Number of children: Not on file   Years of education: Not on file   Highest education level: Not on  file  Occupational History   Not on file  Tobacco Use   Smoking status: Never    Passive exposure: Past   Smokeless tobacco: Never  Vaping Use   Vaping status: Never Used  Substance and Sexual Activity   Alcohol use: Not Currently    Comment: none recently   Drug use: No   Sexual activity: Not on file  Other Topics Concern   Not on file  Social History Narrative   Not on file  Social Drivers of Corporate investment banker Strain: Not on file  Food Insecurity: Not on file  Transportation Needs: Not on file  Physical Activity: Not on file  Stress: Not on file  Social Connections: Not on file  Intimate Partner Violence: Not on file      Review of Systems  Constitutional:  Positive for activity change, appetite change and fatigue. Negative for fever.  HENT:  Positive for postnasal drip. Negative for congestion, rhinorrhea, sinus pressure, sinus pain, sneezing and sore throat.   Respiratory:  Positive for chest tightness and shortness of breath (intermittent). Negative for cough and wheezing.   Cardiovascular: Negative.  Negative for chest pain and palpitations.  Gastrointestinal:  Positive for diarrhea (intermittent) and nausea.  Musculoskeletal:  Positive for arthralgias, back pain and myalgias.  Neurological:  Positive for weakness.    Vital Signs: BP 128/76   Pulse 82   Temp 98.3 F (36.8 C)   Resp 16   Ht 5\' 6"  (1.676 m)   Wt 207 lb 6.4 oz (94.1 kg)   SpO2 95%   BMI 33.48 kg/m    Physical Exam Vitals reviewed.  Constitutional:      General: She is not in acute distress.    Appearance: Normal appearance. She is obese. She is not ill-appearing.  HENT:     Head: Normocephalic and atraumatic.  Eyes:     Pupils: Pupils are equal, round, and reactive to light.  Cardiovascular:     Rate and Rhythm: Normal rate and regular rhythm.     Heart sounds: Normal heart sounds. No murmur heard. Pulmonary:     Effort: Pulmonary effort is normal. No respiratory  distress.     Breath sounds: Normal breath sounds. No wheezing.  Neurological:     Mental Status: She is alert and oriented to person, place, and time.  Psychiatric:        Mood and Affect: Mood normal.        Behavior: Behavior normal.        Assessment/Plan: 1. Erythrocytosis (Primary) 6 minute walk done, oxygen saturation did not drop. B12 injection administered in office today. Overnight pulse oximetry study ordered.  - cyanocobalamin (VITAMIN B12) injection 1,000 mcg - 6 minute walk - Overnight Pulse Oximetry Study; Future  2. Chronic liver disease and cirrhosis (HCC) 6 minute walk done, oxygen saturation did not drop. B12 injection administered in office today. Overnight pulse oximetry study ordered. - cyanocobalamin (VITAMIN B12) injection 1,000 mcg - 6 minute walk - Overnight Pulse Oximetry Study; Future  3. Elevated carbon dioxide level 6 minute walk done, oxygen saturation did not drop. B12 injection administered in office today. Overnight pulse oximetry study ordered. - 6 minute walk - Overnight Pulse Oximetry Study; Future   General Counseling: dashaya getman understanding of the findings of todays visit and agrees with plan of treatment. I have discussed any further diagnostic evaluation that may be needed or ordered today. We also reviewed her medications today. she has been encouraged to call the office with any questions or concerns that should arise related to todays visit.    Orders Placed This Encounter  Procedures   Overnight Pulse Oximetry Study   6 minute walk    Meds ordered this encounter  Medications   cyanocobalamin (VITAMIN B12) injection 1,000 mcg    Return in about 13 weeks (around 06/11/2023) for F/U, med refill, Gerron Guidotti PCP.   Total time spent:30 Minutes Time spent includes review of chart, medications, test results, and  follow up plan with the patient.   [place] Controlled Substance Database was reviewed by me.  This patient was seen  by Sallyanne Kuster, FNP-C in collaboration with Dr. Beverely Risen as a part of collaborative care agreement.   Tanner Vigna R. Tedd Sias, MSN, FNP-C Internal medicine

## 2023-03-12 NOTE — Telephone Encounter (Signed)
Notified Beth & Maralyn Sago of pulseox order-Toni

## 2023-03-13 ENCOUNTER — Other Ambulatory Visit
Admission: RE | Admit: 2023-03-13 | Discharge: 2023-03-13 | Disposition: A | Discharge status: Home / Self Care | Payer: BC Managed Care – PPO | Source: Ambulatory Visit | Attending: Medical Genetics | Admitting: Medical Genetics

## 2023-03-13 DIAGNOSIS — Z006 Encounter for examination for normal comparison and control in clinical research program: Secondary | ICD-10-CM | POA: Insufficient documentation

## 2023-03-17 ENCOUNTER — Other Ambulatory Visit: Payer: Self-pay

## 2023-03-17 ENCOUNTER — Encounter: Payer: Self-pay | Admitting: Nurse Practitioner

## 2023-03-17 DIAGNOSIS — Z79899 Other long term (current) drug therapy: Secondary | ICD-10-CM

## 2023-03-17 MED ORDER — BUSPIRONE HCL 10 MG PO TABS: 10.0000 mg | ORAL_TABLET | Freq: Two times a day (BID) | ORAL | 3 refills | Status: DC

## 2023-03-17 MED ORDER — FUROSEMIDE 20 MG PO TABS: 20.0000 mg | ORAL_TABLET | Freq: Every day | ORAL | 3 refills | Status: DC

## 2023-03-17 NOTE — Telephone Encounter (Signed)
Spoke with pt sent both pres and buspirone 10 mg

## 2023-03-18 ENCOUNTER — Telehealth: Payer: Self-pay | Admitting: Nurse Practitioner

## 2023-03-18 NOTE — Telephone Encounter (Signed)
Neurology appointment 07/06/2023 @ [facility]. This is soonest appointment they have, even for urgent referral. She was put on their wait list-Toni

## 2023-03-25 LAB — HELIX MOLECULAR SCREEN: Genetic Analysis Overall Interpretation: NEGATIVE

## 2023-03-25 LAB — GENECONNECT MOLECULAR SCREEN

## 2023-03-28 DIAGNOSIS — G4733 Obstructive sleep apnea (adult) (pediatric): Secondary | ICD-10-CM | POA: Diagnosis not present

## 2023-04-01 NOTE — Progress Notes (Signed)
Office Visit Note  Patient: Danielle Wallace             Date of Birth: Sep 18, 1960           MRN: [id number]             PCP: Sallyanne Kuster, NP Referring: Sallyanne Kuster, NP Visit Date: 04/15/2023 Occupation: @GUAROCC @  Subjective:  Pain in multiple joints and muscles  History of Present Illness: Danielle Wallace is a 62 y.o. female seen in consultation per request of her PCP.  According the patient she has had discomfort since she was a child.  She states in 2003 she started experiencing increased pain and was evaluated by rheumatologist at the time she was told that she had osteoarthritis in her hands.  She states over time she has been having pain and discomfort in multiple joints.  She describes discomfort in her cervical spine, thoracic and lumbar spine.  She has been told that she has osteoarthritis in her neck and lower back.  She also has seen an orthopedic surgeon for her shoulders were told that she had rotator cuff tendinopathy.  She is recently experiencing discomfort in her elbows wrist and her hands.  She also has discomfort in her bilateral hips, knee joints, ankles and her feet.  She has not noticed any joint swelling but she has noticed swelling in her feet in the past which has been better recently.  She has been experiencing numbness in her lower extremities for which she has been referred to the back of specialist.  She states all her muscles ache and throb.  She gives history of sicca symptoms for several years.  She gets infrequent oral ulcers.  There is no history of malar rash, Raynaud's phenomenon, inflammatory arthritis.  She gives history of lymphadenopathy and photosensitivity.  Patient states she had COVID-19 virus infection last year and then she developed long-term COVID.  She was on oxygen for about 6 months last year and was evaluated by pulmonologist.  She was told that she may have mild asthma.  She states in 2015 she noticed some nodules in her skin which was  not palpable by the doctor.  She had a biopsy which showed only fat cells.  She has not slept well in many years.  She states her sleep has been somewhat better since she has been using CPAP for obstructive sleep apnea.  There is no family history of autoimmune disease.  She is gravida 2, para 3.  There is no history of preeclampsia or DVTs.    Activities of Daily Living:  Patient reports morning stiffness for all day. Patient Reports nocturnal pain.  Difficulty dressing/grooming: Reports Difficulty climbing stairs: Reports Difficulty getting out of chair: Reports Difficulty using hands for taps, buttons, cutlery, and/or writing: Reports  Review of Systems  Constitutional:  Positive for fatigue.  HENT:  Positive for mouth sores and mouth dryness.   Eyes:  Positive for dryness.  Respiratory:  Positive for cough, shortness of breath and wheezing.   Cardiovascular:  Negative for palpitations.  Gastrointestinal:  Positive for blood in stool and constipation. Negative for diarrhea.  Endocrine: Negative for increased urination.  Genitourinary:  Negative for involuntary urination.  Musculoskeletal:  Positive for joint pain, gait problem, joint pain, joint swelling, myalgias, morning stiffness, muscle tenderness and myalgias. Negative for muscle weakness.  Skin:  Positive for hair loss and sensitivity to sunlight. Negative for color change and rash.  Allergic/Immunologic: Positive for susceptible to infections.  Neurological:  Positive for dizziness, numbness, headaches and parasthesias.  Hematological:  Positive for swollen glands.  Psychiatric/Behavioral:  Positive for depressed mood and sleep disturbance. The patient is nervous/anxious.     PMFS History:  Patient Active Problem List   Diagnosis Date Noted   Chronic liver disease and cirrhosis (HCC) 09/06/2022   Restless leg syndrome 07/07/2022   OSA on CPAP 03/03/2022   CPAP use counseling 03/03/2022   Complex tear of medial meniscus  of right knee as current injury 03/04/2020   Aortic atherosclerosis (HCC) 03/04/2020   Osteoarthritis of knee 03/02/2020   Encounter for general adult medical examination with abnormal findings 01/31/2020   Primary osteoarthritis of both knees 01/31/2020   Degenerative disc disease, lumbar 01/31/2020   Genetic testing 11/16/2019   Family history of breast cancer    Family history of liver cancer    Multinodular goiter 10/10/2019   Thyromegaly 09/19/2019   Allergic reaction 07/01/2019   Breast cancer of upper-outer quadrant of left female breast (HCC) 10/27/2018   Routine cervical smear 10/19/2018   Uncontrolled type 2 diabetes mellitus with hyperglycemia (HCC) 10/19/2018   Gastroesophageal reflux disease with esophagitis 10/19/2018   Stomatitis and mucositis 10/19/2018   Urinary tract infection without hematuria 10/19/2018   Dysuria 10/19/2018   Need for vaccination against Streptococcus pneumoniae using pneumococcal conjugate vaccine 13 04/21/2018   Acute bronchitis 10/01/2017   Wheezing 10/01/2017   Sore throat 10/01/2017   Fatigue 09/23/2017   Vitamin D deficiency 09/23/2017   Anemia 09/23/2017   Acute non-recurrent pansinusitis 08/12/2017   Type 2 diabetes mellitus with hyperglycemia, with long-term current use of insulin (HCC) 08/12/2017   Epstein Barr virus infection 08/12/2017   Essential hypertension 05/18/2017   Abnormal CT scan, stomach    Gastric varices    Cellulitis of female breast 12/11/2016   Open wound of breast 12/11/2016   Abdominal distention 01/23/2016   Infection due to Port-A-Cath 12/12/2014   Nausea 09/22/2014   EN (erythema nodosum) 07/26/2014   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   Personal history of renal cancer 05/23/2014   Personal history of other malignant neoplasm of kidney 05/23/2014   Right flank pain 05/23/2014   Carcinoma of upper-outer quadrant of left breast in female, estrogen receptor positive (HCC) 10/14/2013   Calcium blood  increased 06/25/2012   Cancer of kidney (HCC) 06/25/2012   Malignant neoplasm of kidney (HCC) 06/25/2012   Female stress incontinence 06/25/2012   Diabetes mellitus without complication (HCC) 05/12/2004    Past Medical History:  Diagnosis Date   Alpha-1-antitrypsin deficiency (HCC)    per patient   Arthritis 2006   Asthma    Breast cancer (HCC) 10/14/2013   18 mm, T1c, N0; ER/ PR positive,her 2 neu overexpressed. Adjuvant chemo/ herceptin.   Cancer [facility]) 2006   Renal cell carcinoma; cryosurgery treatment, right side   Cirrhosis (HCC)    Collapsed lung 2007   Diabetes mellitus without complication (HCC) 2006   Metformin   Diffuse cystic mastopathy    Family history of breast cancer    Family history of liver cancer    Motion sickness    any moving vehicle   Orthodontics    braces   Personal history of chemotherapy    Sinus problem     Family History  Problem Relation Age of Onset   Diabetes Mother    Hypertension Mother    Emphysema Mother    Aortic aneurysm Mother    Hemachromatosis Father    Liver cancer  Father    Liver disease Father    Gout Brother    Diabetes Brother        passed at age 28   Breast cancer Paternal Aunt 24   Liver cancer Paternal Uncle    Hemochromatosis Paternal Uncle    Ovarian cancer Maternal Grandmother        unk primary, metastatic cancer   Hemachromatosis Daughter    Alpha-1 antitrypsin deficiency Daughter    Other Daughter        familial dominant lipodystrophy   Healthy Son    Healthy Son    Past Surgical History:  Procedure Laterality Date   BREAST BIOPSY Right 2005   negative   BREAST BIOPSY Left 08/23/2007   negative   BREAST BIOPSY Left 10/24/2013   positive   BREAST BIOPSY Left 07/2002   neg   BREAST BIOPSY Left 08/23/2007   neg   BREAST SURGERY Left 10/2011   Cyst Aspirationapocrine metaplasia, ductal cells and bone cells, hypo-cellular   BREAST SURGERY Left 2009   ADH on stereotactic biopsy, 1.5 mm focus.    BREAST SURGERY Left 2003   fibrocystic changes with ductal hyperplasia without atypia.   BREAST SURGERY Left 04/11/2014   Removal of implant, debridement chest wall Dr.Coan   CESAREAN SECTION  1991   CHOLECYSTECTOMY  1990   COLONOSCOPY  08/26/2013   Lutricia Feil, M.D. normal.   CRYOABLATION  2005, 2010   CYST REMOVAL NECK  10/2011   Dr. Jenne Campus   ESOPHAGOGASTRODUODENOSCOPY (EGD) WITH PROPOFOL N/A 01/16/2017   Procedure: ESOPHAGOGASTRODUODENOSCOPY (EGD) WITH PROPOFOL;  Surgeon: Midge Minium, MD;  Location: [facility];  Service: Gastroenterology;  Laterality: N/A;  Diabetic - oral meds   INCISION AND DRAINAGE Left Dec 2015, Feb 2016   Dr Meriam Sprague   MASTECTOMY Left 11/24/2013   positive   PORTACATH PLACEMENT  11/24/2013   removal of portacath     Social History   Social History Narrative   Not on file   Immunization History  Administered Date(s) Administered   Influenza Inj Mdck Quad Pf 01/24/2021, 03/25/2022   Influenza, Mdck, Trivalent,PF 6+ MOS(egg free) 01/27/2023   Moderna Covid-19 Fall Seasonal Vaccine 49yrs & older 08/11/2022   Moderna Sars-Covid-2 Vaccination 07/22/2019, 08/22/2019, 03/12/2020, 11/05/2020   PNEUMOCOCCAL CONJUGATE-20 08/11/2022   Zoster Recombinant(Shingrix) 08/11/2022     Objective: Vital Signs: BP 130/82 (BP Location: Right Arm, Patient Position: Sitting, Cuff Size: Normal)   Pulse 82   Resp 16   Ht 5\' 5"  (1.651 m)   Wt 207 lb 12.8 oz (94.3 kg)   BMI 34.58 kg/m    Physical Exam Vitals and nursing note reviewed.  Constitutional:      Appearance: She is well-developed.  HENT:     Head: Normocephalic and atraumatic.  Eyes:     Conjunctiva/sclera: Conjunctivae normal.  Cardiovascular:     Rate and Rhythm: Normal rate and regular rhythm.     Heart sounds: Normal heart sounds.  Pulmonary:     Effort: Pulmonary effort is normal.     Breath sounds: Normal breath sounds.  Abdominal:     General: Bowel sounds are normal.     Palpations: Abdomen  is soft.  Musculoskeletal:     Cervical back: Normal range of motion.  Lymphadenopathy:     Cervical: No cervical adenopathy.  Skin:    General: Skin is warm and dry.     Capillary Refill: Capillary refill takes less than 2 seconds.  Neurological:  Mental Status: She is alert and oriented to person, place, and time.  Psychiatric:        Behavior: Behavior normal.      Musculoskeletal Exam: Cervical, thoracic and lumbar spine were in good range of motion.  Shoulder joints, elbow joints, wrist joints, and MCPs were in good range of motion with no synovitis.  She had bilateral PIP and DIP thickening.  No synovitis was noted.  Hip joints were in good range of motion.  She had tenderness over bilateral trochanteric bursa.  She had good range of motion of bilateral knee joints without any warmth swelling or effusion.  She had tenderness over the medial aspect of her knee.  There was no tenderness over ankles or MTPs.  First MTP thickening and bilateral pes cavus was noted.  CDAI Exam: CDAI Score: -- Patient Global: --; Provider Global: -- Swollen: --; Tender: -- Joint Exam 04/15/2023   No joint exam has been documented for this visit   There is currently no information documented on the homunculus. Go to the Rheumatology activity and complete the homunculus joint exam.  Investigation: No additional findings.  Imaging: MM 3D SCREENING MAMMOGRAM UNILATERAL RIGHT BREAST  Result Date: 04/14/2023 CLINICAL DATA:  Screening. EXAM: DIGITAL SCREENING UNILATERAL RIGHT MAMMOGRAM WITH CAD AND TOMOSYNTHESIS TECHNIQUE: Right screening digital craniocaudal and mediolateral oblique mammograms were obtained. Right screening digital breast tomosynthesis was performed. The images were evaluated with computer-aided detection. COMPARISON:  Previous exam(s). ACR Breast Density Category c: The breasts are heterogeneously dense, which may obscure small masses. FINDINGS: The patient has had a left mastectomy.  There are no findings suspicious for malignancy. IMPRESSION: No mammographic evidence of malignancy. A result letter of this screening mammogram will be mailed directly to the patient. RECOMMENDATION: Screening mammogram in one year.  (Code:SM-R-34M) BI-RADS CATEGORY  1: Negative. Electronically Signed   By: Ted Mcalpine M.D.   On: 04/14/2023 12:45    Recent Labs: Lab Results  Component Value Date   WBC 5.2 12/25/2022   HGB 14.0 12/25/2022   PLT 169 12/25/2022   NA 136 09/03/2022   K 4.1 09/03/2022   CL 102 09/03/2022   CO2 28 09/03/2022   GLUCOSE 128 (H) 09/03/2022   BUN 20 09/03/2022   CREATININE 0.71 09/03/2022   BILITOT 0.5 09/03/2022   ALKPHOS 77 09/03/2022   AST 27 09/03/2022   ALT 23 09/03/2022   PROT 7.4 09/03/2022   ALBUMIN 3.6 09/03/2022   CALCIUM 8.8 (L) 09/03/2022   GFRAA 112 01/18/2020   September 04, 2022 CK 39 December 17, 2022 ANA negative, ENA (dsDNA, RNP, Katrinka Blazing, SCL 70, SSA, SSB, smooth muscle, mitochondrial (negative, TPO negative, C4 normal, parietal cell antibody negative December 25, 2022 iron studies normal, B12 normal, folate normal, TSH normal, free T4 normal August 18, 2022 ANA 1: 80, RF negative, anti-CCP negative  Speciality Comments: No specialty comments available.  Procedures:  No procedures performed Allergies: Doxycycline, Exemestane, Floxin [ofloxacin], Gluten meal, Levofloxacin, Linezolid, Sesame oil, Taxotere [docetaxel], Hydrocodone, Invokana [canagliflozin], Metformin and related, Sulfa antibiotics, Codeine, Penicillins, and Vancomycin   Assessment / Plan:     Visit Diagnoses: Polyarthralgia -patient complains of pain in her joints since she was a child.  She had no synovitis on the examination.  There is no history of inflammatory arthritis.  She had generalized hyperalgesia and positive tender points.  She gives history of pain in all of her joints and muscles.  ANA negative, ENA negative, C4 normal.  RF negative, anti-CCP negative  lab results  were discussed with the patient.  Myalgia-she gives history of pain in all of her muscles.  No muscular weakness was noted.  She had hyperalgesia and positive tender points.  Fibromyalgia-detailed counsel regarding fibromyalgia was provided.  Her symptoms are consistent with fibromyalgia with generalized hyperalgesia, tender points, and pain in all all the muscles and joints.  Benefits of water aerobics and swimming were discussed.  She may also benefit from physical therapy.  Dry mouth -most likely related to the medication use.  No parotid swelling was noted.  SSA negative, SSB negative, ANA negative  Primary osteoarthritis of both hands-she had bilateral PIP and DIP thickening.  No synovitis was noted.  Detail counseled regarding osteoarthritis was provided.  A handout on hand exercises was given.  Primary osteoarthritis of both knees-patient has history of osteoarthritis in her knee joints.  No warmth swelling or effusion was noted.  She had tenderness over the medial aspect of the knees.  A handout on lower extremity exercises was given.  Lumbar spondylosis-patient is history of lower back pain and arthritis in her lumbar spine.  She denies any radiculopathy.  Although she has been experiencing some paresthesias on her thighs and has an appointment coming up with a back specialist.  A handout on core strengthening exercises for lower back pain was given.  Carcinoma of upper-outer quadrant of left breast in female, estrogen receptor positive (HCC) - 2015, s/p mastectomy, CTX , no RTX.  By oncologist at [facility].  Malignant neoplasm of right kidney (HCC) - 2004 ,Cryoablation x 2.  Chronic liver disease and cirrhosis (HCC) - 1 antitrypsin deficiency, followed by Dr. Pervis Hocking at [facility]  Other medical problems are listed as follows:  Gastric varices  Diabetes mellitus without complication (HCC)  Aortic atherosclerosis (HCC)  Essential hypertension-blood pressure was normal  today.  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 ttd  Gastroesophageal reflux disease with esophagitis without hemorrhage  Vitamin D deficiency  Female stress incontinence  Restless leg syndrome  OSA on CPAP  Multinodular goiter  Orders: No orders of the defined types were placed in this encounter.  No orders of the defined types were placed in this encounter.   Face-to-face time spent with patient was 45 minutes. Greater than 50% of time was spent in counseling and coordination of care.  Follow-Up Instructions: Return if symptoms worsen or fail to improve, for Osteoarthritis.   Pollyann Savoy, MD  Note - This record has been created using Animal nutritionist.  Chart creation errors have been sought, but may not always  have been located. Such creation errors do not reflect on  the standard of medical care.

## 2023-04-08 ENCOUNTER — Ambulatory Visit (INDEPENDENT_AMBULATORY_CARE_PROVIDER_SITE_OTHER): Payer: BC Managed Care – PPO

## 2023-04-08 DIAGNOSIS — E538 Deficiency of other specified B group vitamins: Secondary | ICD-10-CM

## 2023-04-08 MED ORDER — CYANOCOBALAMIN 1000 MCG/ML IJ SOLN
1000.0000 ug | Freq: Once | INTRAMUSCULAR | Status: AC
  Administered 2023-04-08: 1000 ug via INTRAMUSCULAR

## 2023-04-13 ENCOUNTER — Other Ambulatory Visit: Payer: Self-pay | Admitting: Nurse Practitioner

## 2023-04-13 ENCOUNTER — Ambulatory Visit
Admission: RE | Admit: 2023-04-13 | Discharge: 2023-04-13 | Disposition: A | Discharge status: Home / Self Care | Payer: BC Managed Care – PPO | Source: Ambulatory Visit | Attending: Nurse Practitioner | Admitting: Nurse Practitioner

## 2023-04-13 DIAGNOSIS — G5793 Unspecified mononeuropathy of bilateral lower limbs: Secondary | ICD-10-CM

## 2023-04-13 DIAGNOSIS — Z0001 Encounter for general adult medical examination with abnormal findings: Secondary | ICD-10-CM

## 2023-04-13 DIAGNOSIS — E1165 Type 2 diabetes mellitus with hyperglycemia: Secondary | ICD-10-CM

## 2023-04-13 DIAGNOSIS — G5693 Unspecified mononeuropathy of bilateral upper limbs: Secondary | ICD-10-CM

## 2023-04-13 DIAGNOSIS — R300 Dysuria: Secondary | ICD-10-CM

## 2023-04-13 DIAGNOSIS — Z79899 Other long term (current) drug therapy: Secondary | ICD-10-CM

## 2023-04-13 DIAGNOSIS — Z23 Encounter for immunization: Secondary | ICD-10-CM

## 2023-04-13 DIAGNOSIS — K2940 Chronic atrophic gastritis without bleeding: Secondary | ICD-10-CM

## 2023-04-13 DIAGNOSIS — Z1231 Encounter for screening mammogram for malignant neoplasm of breast: Secondary | ICD-10-CM | POA: Diagnosis not present

## 2023-04-15 ENCOUNTER — Encounter: Payer: Self-pay | Admitting: Rheumatology

## 2023-04-15 ENCOUNTER — Ambulatory Visit: Payer: BC Managed Care – PPO | Attending: Rheumatology | Admitting: Rheumatology

## 2023-04-15 VITALS — BP 130/82 | HR 82 | Resp 16 | Ht 65.0 in | Wt 207.8 lb

## 2023-04-15 DIAGNOSIS — I864 Gastric varices: Secondary | ICD-10-CM

## 2023-04-15 DIAGNOSIS — R682 Dry mouth, unspecified: Secondary | ICD-10-CM

## 2023-04-15 DIAGNOSIS — E042 Nontoxic multinodular goiter: Secondary | ICD-10-CM

## 2023-04-15 DIAGNOSIS — M797 Fibromyalgia: Secondary | ICD-10-CM

## 2023-04-15 DIAGNOSIS — M2550 Pain in unspecified joint: Secondary | ICD-10-CM

## 2023-04-15 DIAGNOSIS — I700 Atherosclerosis of aorta: Secondary | ICD-10-CM

## 2023-04-15 DIAGNOSIS — A319 Mycobacterial infection, unspecified: Secondary | ICD-10-CM

## 2023-04-15 DIAGNOSIS — K769 Liver disease, unspecified: Secondary | ICD-10-CM

## 2023-04-15 DIAGNOSIS — N393 Stress incontinence (female) (male): Secondary | ICD-10-CM

## 2023-04-15 DIAGNOSIS — C641 Malignant neoplasm of right kidney, except renal pelvis: Secondary | ICD-10-CM

## 2023-04-15 DIAGNOSIS — M19041 Primary osteoarthritis, right hand: Secondary | ICD-10-CM

## 2023-04-15 DIAGNOSIS — E119 Type 2 diabetes mellitus without complications: Secondary | ICD-10-CM

## 2023-04-15 DIAGNOSIS — G2581 Restless legs syndrome: Secondary | ICD-10-CM

## 2023-04-15 DIAGNOSIS — R229 Localized swelling, mass and lump, unspecified: Secondary | ICD-10-CM

## 2023-04-15 DIAGNOSIS — K2100 Gastro-esophageal reflux disease with esophagitis, without bleeding: Secondary | ICD-10-CM

## 2023-04-15 DIAGNOSIS — M19042 Primary osteoarthritis, left hand: Secondary | ICD-10-CM

## 2023-04-15 DIAGNOSIS — M791 Myalgia, unspecified site: Secondary | ICD-10-CM

## 2023-04-15 DIAGNOSIS — C50412 Malignant neoplasm of upper-outer quadrant of left female breast: Secondary | ICD-10-CM

## 2023-04-15 DIAGNOSIS — Z170 Estrogen receptor positive status [ER+]: Secondary | ICD-10-CM

## 2023-04-15 DIAGNOSIS — M170 Bilateral primary osteoarthritis of knee: Secondary | ICD-10-CM

## 2023-04-15 DIAGNOSIS — E559 Vitamin D deficiency, unspecified: Secondary | ICD-10-CM

## 2023-04-15 DIAGNOSIS — I10 Essential (primary) hypertension: Secondary | ICD-10-CM

## 2023-04-15 DIAGNOSIS — K7460 Unspecified cirrhosis of liver: Secondary | ICD-10-CM

## 2023-04-15 DIAGNOSIS — M47816 Spondylosis without myelopathy or radiculopathy, lumbar region: Secondary | ICD-10-CM

## 2023-04-15 DIAGNOSIS — C649 Malignant neoplasm of unspecified kidney, except renal pelvis: Secondary | ICD-10-CM

## 2023-04-15 DIAGNOSIS — G4733 Obstructive sleep apnea (adult) (pediatric): Secondary | ICD-10-CM

## 2023-04-15 DIAGNOSIS — M51369 Other intervertebral disc degeneration, lumbar region without mention of lumbar back pain or lower extremity pain: Secondary | ICD-10-CM

## 2023-04-15 NOTE — Patient Instructions (Signed)
Hand Exercises Hand exercises can be helpful for almost anyone. They can strengthen your hands and improve flexibility and movement. The exercises can also increase blood flow to the hands. These results can make your work and daily tasks easier for you. Hand exercises can be especially helpful for people who have joint pain from arthritis or nerve damage from using their hands over and over. These exercises can also help people who injure a hand. Exercises Most of these hand exercises are gentle stretching and motion exercises. It is usually safe to do them often throughout the day. Warming up your hands before exercise may help reduce stiffness. You can do this with gentle massage or by placing your hands in warm water for 10-15 minutes. It is normal to feel some stretching, pulling, tightness, or mild discomfort when you begin new exercises. In time, this will improve. Remember to always be careful and stop right away if you feel sudden, very bad pain or your pain gets worse. You want to get better and be safe. Ask your health care provider which exercises are safe for you. Do exercises exactly as told by your provider and adjust them as told. Do not begin these exercises until told by your provider. Knuckle bend or "claw" fist  Stand or sit with your arm, hand, and all five fingers pointed straight up. Make sure to keep your wrist straight. Gently bend your fingers down toward your palm until the tips of your fingers are touching your palm. Keep your big knuckle straight and only bend the small knuckles in your fingers. Hold this position for 10 seconds. Straighten your fingers back to your starting position. Repeat this exercise 5-10 times with each hand. Full finger fist  Stand or sit with your arm, hand, and all five fingers pointed straight up. Make sure to keep your wrist straight. Gently bend your fingers into your palm until the tips of your fingers are touching the middle of your  palm. Hold this position for 10 seconds. Extend your fingers back to your starting position, stretching every joint fully. Repeat this exercise 5-10 times with each hand. Straight fist  Stand or sit with your arm, hand, and all five fingers pointed straight up. Make sure to keep your wrist straight. Gently bend your fingers at the big knuckle, where your fingers meet your hand, and at the middle knuckle. Keep the knuckle at the tips of your fingers straight and try to touch the bottom of your palm. Hold this position for 10 seconds. Extend your fingers back to your starting position, stretching every joint fully. Repeat this exercise 5-10 times with each hand. Tabletop  Stand or sit with your arm, hand, and all five fingers pointed straight up. Make sure to keep your wrist straight. Gently bend your fingers at the big knuckle, where your fingers meet your hand, as far down as you can. Keep the small knuckles in your fingers straight. Think of forming a tabletop with your fingers. Hold this position for 10 seconds. Extend your fingers back to your starting position, stretching every joint fully. Repeat this exercise 5-10 times with each hand. Finger spread  Place your hand flat on a table with your palm facing down. Make sure your wrist stays straight. Spread your fingers and thumb apart from each other as far as you can until you feel a gentle stretch. Hold this position for 10 seconds. Bring your fingers and thumb tight together again. Hold this position for 10 seconds. Repeat  this exercise 5-10 times with each hand. Making circles  Stand or sit with your arm, hand, and all five fingers pointed straight up. Make sure to keep your wrist straight. Make a circle by touching the tip of your thumb to the tip of your index finger. Hold for 10 seconds. Then open your hand wide. Repeat this motion with your thumb and each of your fingers. Repeat this exercise 5-10 times with each hand. Thumb  motion  Sit with your forearm resting on a table and your wrist straight. Your thumb should be facing up toward the ceiling. Keep your fingers relaxed as you move your thumb. Lift your thumb up as high as you can toward the ceiling. Hold for 10 seconds. Bend your thumb across your palm as far as you can, reaching the tip of your thumb for the small finger (pinkie) side of your palm. Hold for 10 seconds. Repeat this exercise 5-10 times with each hand. Grip strengthening  Hold a stress ball or other soft ball in the middle of your hand. Slowly increase the pressure, squeezing the ball as much as you can without causing pain. Think of bringing the tips of your fingers into the middle of your palm. All of your finger joints should bend when doing this exercise. Hold your squeeze for 10 seconds, then relax. Repeat this exercise 5-10 times with each hand. Contact a health care provider if: Your hand pain or discomfort gets much worse when you do an exercise. Your hand pain or discomfort does not improve within 2 hours after you exercise. If you have either of these problems, stop doing these exercises right away. Do not do them again unless your provider says that you can. Get help right away if: You develop sudden, severe hand pain or swelling. If this happens, stop doing these exercises right away. Do not do them again unless your provider says that you can. This information is not intended to replace advice given to you by your health care provider. Make sure you discuss any questions you have with your health care provider. Document Revised: 05/13/2022 Document Reviewed: 05/13/2022 [employer] Patient Education  2024 [employer] Exercises for Chronic Knee Pain Chronic knee pain is pain that lasts longer than 3 months. For most people with chronic knee pain, exercise and weight loss is an important part of treatment. Your health care provider may want you to focus on: Making the muscles that  support your knee stronger. This can take pressure off your knee and reduce pain. Preventing knee stiffness. How far you can move your knee, keeping it there or making it farther. Losing weight (if this applies) to take pressure off your knee, lower your risk for injury, and make it easier for you to exercise. Your provider will help you make an exercise program that fits your needs and physical abilities. Below are simple, low-impact exercises you can do at home. Ask your provider or physical therapist how often you should do your exercise program and how many times to repeat each exercise. General safety tips  Get your provider's approval before doing any exercises. Start slowly and stop any time you feel pain. Do not exercise if your knee pain is flaring up. Warm up first. Stretching a cold muscle can cause an injury. Do 5-10 minutes of easy movement or light stretching before beginning your exercises. Do 5-10 minutes of low-impact activity (like walking or cycling) before starting strengthening exercises. Contact your provider any time you have pain during  or after exercising. Exercise can cause discomfort but should not be painful. It is normal to be a little stiff or sore after exercising. Stretching and range-of-motion exercises Front thigh stretch  Stand up straight and support your body by holding on to a chair or resting one hand on a wall. With your legs straight and close together, bend one knee to lift your heel up toward your butt. Using one hand for support, grab your ankle with your free hand. Pull your foot up closer toward your butt to feel the stretch in front of your thigh. Hold the stretch for 30 seconds. Repeat __________ times. Complete this exercise __________ times a day. Back thigh stretch  Sit on the floor with your back straight and your legs out straight in front of you. Place the palms of your hands on the floor and slide them toward your feet as you bend at the  hip. Try to touch your nose to your knees and feel the stretch in the back of your thighs. Hold for 30 seconds. Repeat __________ times. Complete this exercise __________ times a day. Calf stretch  Stand facing a wall. Place the palms of your hands flat against the wall, arms extended, and lean slightly against the wall. Get into a lunge position with one leg bent at the knee and the other leg stretched out straight behind you. Keep both feet facing the wall and increase the bend in your knee while keeping the heel of the other leg flat on the ground. You should feel the stretch in your calf. Hold for 30 seconds. Repeat __________ times. Complete this exercise __________ times a day. Strengthening exercises Straight leg lift  Lie on your back with one knee bent and the other leg out straight. Slowly lift the straight leg without bending the knee. Lift until your foot is about 12 inches (30 cm) off the floor. Hold for 3-5 seconds and slowly lower your leg. Repeat __________ times. Complete this exercise __________ times a day. Single leg dip  Stand between two chairs and put both hands on the backs of the chairs for support. Extend one leg out straight with your body weight resting on the heel of the standing leg. Slowly bend your standing knee to dip your body to the level that is comfortable for you. Hold for 3-5 seconds. Repeat __________ times. Complete this exercise __________ times a day. Hamstring curls  Stand straight, knees close together, facing the back of a chair. Hold on to the back of a chair with both hands. Keep one leg straight. Bend the other knee while bringing the heel up toward the butt until the knee is bent at a 90-degree angle (right angle). Hold for 3-5 seconds. Repeat __________ times. Complete this exercise __________ times a day. Wall squat  Stand straight with your back, hips, and head against a wall. Step forward one foot at a time with your back  still against the wall. Your feet should be 2 feet (61 cm) from the wall at shoulder width. Keeping your back, hips, and head against the wall, slide down the wall to as close to a sitting position as you can get. Hold for 5-10 seconds, then slowly slide back up. Repeat __________ times. Complete this exercise __________ times a day. Step-ups  Stand in front of a sturdy platform or stool that is about 6 inches (15 cm) high. Slowly step up with your left / right foot, keeping your knee in line with your hip  and foot. Do not let your knee bend so far that you cannot see your toes. Hold on to a chair for balance, but do not use it for support. Slowly unlock your knee and lower yourself to the starting position. Repeat __________ times. Complete this exercise __________ times a day. Contact a health care provider if: Your exercises cause pain. Your pain is worse after you exercise. Your pain prevents you from doing your exercises. This information is not intended to replace advice given to you by your health care provider. Make sure you discuss any questions you have with your health care provider. Document Revised: 05/13/2022 Document Reviewed: 05/13/2022 Else[employer] Patient Education  2024 [employer] Low Back Sprain or Strain Rehab Ask your health care provider which exercises are safe for you. Do exercises exactly as told by your health care provider and adjust them as directed. It is normal to feel mild stretching, pulling, tightness, or discomfort as you do these exercises. Stop right away if you feel sudden pain or your pain gets worse. Do not begin these exercises until told by your health care provider. Stretching and range-of-motion exercises These exercises warm up your muscles and joints and improve the movement and flexibility of your back. These exercises also help to relieve pain, numbness, and tingling. Lumbar rotation  Lie on your back on a firm bed or the floor with your knees  bent. Straighten your arms out to your sides so each arm forms a 90-degree angle (right angle) with a side of your body. Slowly move (rotate) both of your knees to one side of your body until you feel a stretch in your lower back (lumbar). Try not to let your shoulders lift off the floor. Hold this position for __________ seconds. Tense your abdominal muscles and slowly move your knees back to the starting position. Repeat this exercise on the other side of your body. Repeat __________ times. Complete this exercise __________ times a day. Single knee to chest  Lie on your back on a firm bed or the floor with both legs straight. Bend one of your knees. Use your hands to move your knee up toward your chest until you feel a gentle stretch in your lower back and buttock. Hold your leg in this position by holding on to the front of your knee. Keep your other leg as straight as possible. Hold this position for __________ seconds. Slowly return to the starting position. Repeat with your other leg. Repeat __________ times. Complete this exercise __________ times a day.   This exercise strengthens the muscles that lie deep in the abdomen. Lie on your back on a firm bed or the floor with your legs extended. Bend your knees so they are pointing toward the ceiling and your feet are flat on the floor. Tighten your lower abdominal muscles to press your lower back against the floor. This motion will tilt your pelvis so your tailbone points up toward the ceiling instead of pointing to your feet or the floor. To help with this exercise, you may place a small towel under your lower back and try to push your back into the towel. Hold this position for __________ seconds. Let your muscles relax completely before you repeat this exercise. Repeat __________ times. Complete this exercise __________ times a day. Alternating arm and leg raises  Get on your hands and knees on a firm surface. If you are on a hard  floor, you may want to use padding, such as an exercise  mat, to cushion your knees. Line up your arms and legs. Your hands should be directly below your shoulders, and your knees should be directly below your hips. Lift your left leg behind you. At the same time, raise your right arm and straighten it in front of you. Do not lift your leg higher than your hip. Do not lift your arm higher than your shoulder. Keep your abdominal and back muscles tight. Keep your hips facing the ground. Do not arch your back. Keep your balance carefully, and do not hold your breath. Hold this position for __________ seconds. Slowly return to the starting position. Repeat with your right leg and your left arm. Repeat __________ times. Complete this exercise __________ times a day. Abdominal set with straight leg raise  Lie on your back on a firm bed or the floor. Bend one of your knees and keep your other leg straight. Tense your abdominal muscles and lift your straight leg up, 4-6 inches (10-15 cm) off the ground. Keep your abdominal muscles tight and hold this position for __________ seconds. Do not hold your breath. Do not arch your back. Keep it flat against the ground. Keep your abdominal muscles tense as you slowly lower your leg back to the starting position. Repeat with your other leg. Repeat __________ times. Complete this exercise __________ times a day. Single leg lower with bent knees Lie on your back on a firm bed or the floor. Tense your abdominal muscles and lift your feet off the floor, one foot at a time, so your knees and hips are bent in 90-degree angles (right angles). Your knees should be over your hips and your lower legs should be parallel to the floor. Keeping your abdominal muscles tense and your knee bent, slowly lower one of your legs so your toe touches the ground. Lift your leg back up to return to the starting position. Do not hold your breath. Do not let your back arch. Keep  your back flat against the ground. Repeat with your other leg. Repeat __________ times. Complete this exercise __________ times a day. Posture and body mechanics Good posture and healthy body mechanics can help to relieve stress in your body's tissues and joints. Body mechanics refers to the movements and positions of your body while you do your daily activities. Posture is part of body mechanics. Good posture means: Your spine is in its natural S-curve position (neutral). Your shoulders are pulled back slightly. Your head is not tipped forward (neutral). Follow these guidelines to improve your posture and body mechanics in your everyday activities. Standing  When standing, keep your spine neutral and your feet about hip-width apart. Keep a slight bend in your knees. Your ears, shoulders, and hips should line up. When you do a task in which you stand in one place for a long time, place one foot up on a stable object that is 2-4 inches (5-10 cm) high, such as a footstool. This helps keep your spine neutral. Sitting  When sitting, keep your spine neutral and keep your feet flat on the floor. Use a footrest, if necessary, and keep your thighs parallel to the floor. Avoid rounding your shoulders, and avoid tilting your head forward. When working at a desk or a computer, keep your desk at a height where your hands are slightly lower than your elbows. Slide your chair under your desk so you are close enough to maintain good posture. When working at a computer, place your monitor at a height  where you are looking straight ahead and you do not have to tilt your head forward or downward to look at the screen. Resting When lying down and resting, avoid positions that are most painful for you. If you have pain with activities such as sitting, bending, stooping, or squatting, lie in a position in which your body does not bend very much. For example, avoid curling up on your side with your arms and knees near  your chest (fetal position). If you have pain with activities such as standing for a long time or reaching with your arms, lie with your spine in a neutral position and bend your knees slightly. Try the following positions: Lying on your side with a pillow between your knees. Lying on your back with a pillow under your knees. Lifting  When lifting objects, keep your feet at least shoulder-width apart and tighten your abdominal muscles. Bend your knees and hips and keep your spine neutral. It is important to lift using the strength of your legs, not your back. Do not lock your knees straight out. Always ask for help to lift heavy or awkward objects. This information is not intended to replace advice given to you by your health care provider. Make sure you discuss any questions you have with your health care provider. Document Revised: 09/01/2022 Document Reviewed: 07/16/2020 Elsevier Patient Education  2024 [employer].

## 2023-04-27 ENCOUNTER — Telehealth: Payer: Self-pay

## 2023-04-27 ENCOUNTER — Other Ambulatory Visit: Payer: Self-pay

## 2023-04-27 DIAGNOSIS — G4733 Obstructive sleep apnea (adult) (pediatric): Secondary | ICD-10-CM | POA: Diagnosis not present

## 2023-04-27 MED ORDER — AZITHROMYCIN 250 MG PO TABS: ORAL_TABLET | ORAL | 0 refills | Status: DC

## 2023-04-27 NOTE — Telephone Encounter (Signed)
Pt called that she just came back from travel to different state having sinusitis and congestion and covid test is negative as per alyssa sent zpak

## 2023-05-05 ENCOUNTER — Other Ambulatory Visit: Payer: Self-pay | Admitting: Nurse Practitioner

## 2023-05-05 DIAGNOSIS — Z79899 Other long term (current) drug therapy: Secondary | ICD-10-CM

## 2023-05-07 NOTE — Telephone Encounter (Signed)
Please review and send 

## 2023-05-08 ENCOUNTER — Ambulatory Visit (INDEPENDENT_AMBULATORY_CARE_PROVIDER_SITE_OTHER): Payer: BC Managed Care – PPO

## 2023-05-08 DIAGNOSIS — E538 Deficiency of other specified B group vitamins: Secondary | ICD-10-CM

## 2023-05-08 MED ORDER — CYANOCOBALAMIN 1000 MCG/ML IJ SOLN
1000.0000 ug | Freq: Once | INTRAMUSCULAR | Status: AC
  Administered 2023-05-08: 1000 ug via INTRAMUSCULAR

## 2023-05-09 ENCOUNTER — Encounter: Payer: Self-pay | Admitting: Nurse Practitioner

## 2023-05-11 ENCOUNTER — Telehealth: Payer: Self-pay

## 2023-05-12 MED ORDER — HYDROCOD POLI-CHLORPHE POLI ER 10-8 MG/5ML PO SUER: 5.0000 mL | Freq: Two times a day (BID) | ORAL | 0 refills | Status: DC | PRN

## 2023-05-12 NOTE — Telephone Encounter (Signed)
 Med sent.

## 2023-05-13 ENCOUNTER — Encounter (HOSPITAL_COMMUNITY): Payer: Self-pay

## 2023-05-13 ENCOUNTER — Other Ambulatory Visit: Payer: Self-pay

## 2023-05-13 ENCOUNTER — Inpatient Hospital Stay (HOSPITAL_COMMUNITY)
Admission: EM | Admit: 2023-05-13 | Discharge: 2023-06-13 | DRG: 064 | Disposition: E | Payer: BC Managed Care – PPO | Source: Other Acute Inpatient Hospital | Attending: Emergency Medicine | Admitting: Emergency Medicine

## 2023-05-13 ENCOUNTER — Inpatient Hospital Stay (HOSPITAL_COMMUNITY): Payer: BC Managed Care – PPO

## 2023-05-13 ENCOUNTER — Emergency Department: Payer: BC Managed Care – PPO

## 2023-05-13 ENCOUNTER — Emergency Department
Admission: EM | Admit: 2023-05-13 | Discharge: 2023-05-13 | Disposition: A | Discharge status: Other Acute Inpatient Hospital | Payer: BC Managed Care – PPO | Attending: Emergency Medicine | Admitting: Emergency Medicine

## 2023-05-13 DIAGNOSIS — K7460 Unspecified cirrhosis of liver: Secondary | ICD-10-CM | POA: Diagnosis present

## 2023-05-13 DIAGNOSIS — Z7985 Long-term (current) use of injectable non-insulin antidiabetic drugs: Secondary | ICD-10-CM

## 2023-05-13 DIAGNOSIS — R4182 Altered mental status, unspecified: Secondary | ICD-10-CM | POA: Diagnosis not present

## 2023-05-13 DIAGNOSIS — J45909 Unspecified asthma, uncomplicated: Secondary | ICD-10-CM | POA: Diagnosis present

## 2023-05-13 DIAGNOSIS — R4701 Aphasia: Secondary | ICD-10-CM | POA: Diagnosis not present

## 2023-05-13 DIAGNOSIS — Z79899 Other long term (current) drug therapy: Secondary | ICD-10-CM

## 2023-05-13 DIAGNOSIS — I619 Nontraumatic intracerebral hemorrhage, unspecified: Principal | ICD-10-CM

## 2023-05-13 DIAGNOSIS — Z888 Allergy status to other drugs, medicaments and biological substances status: Secondary | ICD-10-CM

## 2023-05-13 DIAGNOSIS — R29818 Other symptoms and signs involving the nervous system: Secondary | ICD-10-CM | POA: Diagnosis not present

## 2023-05-13 DIAGNOSIS — Z9221 Personal history of antineoplastic chemotherapy: Secondary | ICD-10-CM

## 2023-05-13 DIAGNOSIS — G8194 Hemiplegia, unspecified affecting left nondominant side: Secondary | ICD-10-CM | POA: Diagnosis not present

## 2023-05-13 DIAGNOSIS — G936 Cerebral edema: Secondary | ICD-10-CM | POA: Diagnosis not present

## 2023-05-13 DIAGNOSIS — Z66 Do not resuscitate: Secondary | ICD-10-CM | POA: Diagnosis not present

## 2023-05-13 DIAGNOSIS — G9349 Other encephalopathy: Secondary | ICD-10-CM | POA: Diagnosis present

## 2023-05-13 DIAGNOSIS — Z881 Allergy status to other antibiotic agents status: Secondary | ICD-10-CM | POA: Diagnosis not present

## 2023-05-13 DIAGNOSIS — Z85528 Personal history of other malignant neoplasm of kidney: Secondary | ICD-10-CM

## 2023-05-13 DIAGNOSIS — Z9049 Acquired absence of other specified parts of digestive tract: Secondary | ICD-10-CM

## 2023-05-13 DIAGNOSIS — R29732 NIHSS score 32: Secondary | ICD-10-CM | POA: Diagnosis present

## 2023-05-13 DIAGNOSIS — Z8249 Family history of ischemic heart disease and other diseases of the circulatory system: Secondary | ICD-10-CM

## 2023-05-13 DIAGNOSIS — I6523 Occlusion and stenosis of bilateral carotid arteries: Secondary | ICD-10-CM | POA: Diagnosis not present

## 2023-05-13 DIAGNOSIS — F32A Depression, unspecified: Secondary | ICD-10-CM | POA: Diagnosis not present

## 2023-05-13 DIAGNOSIS — Z825 Family history of asthma and other chronic lower respiratory diseases: Secondary | ICD-10-CM

## 2023-05-13 DIAGNOSIS — Z9012 Acquired absence of left breast and nipple: Secondary | ICD-10-CM | POA: Diagnosis not present

## 2023-05-13 DIAGNOSIS — E119 Type 2 diabetes mellitus without complications: Secondary | ICD-10-CM | POA: Diagnosis present

## 2023-05-13 DIAGNOSIS — G911 Obstructive hydrocephalus: Secondary | ICD-10-CM | POA: Diagnosis present

## 2023-05-13 DIAGNOSIS — I618 Other nontraumatic intracerebral hemorrhage: Secondary | ICD-10-CM | POA: Diagnosis present

## 2023-05-13 DIAGNOSIS — I610 Nontraumatic intracerebral hemorrhage in hemisphere, subcortical: Secondary | ICD-10-CM | POA: Diagnosis not present

## 2023-05-13 DIAGNOSIS — Z833 Family history of diabetes mellitus: Secondary | ICD-10-CM

## 2023-05-13 DIAGNOSIS — Z4682 Encounter for fitting and adjustment of non-vascular catheter: Secondary | ICD-10-CM | POA: Diagnosis not present

## 2023-05-13 DIAGNOSIS — I609 Nontraumatic subarachnoid hemorrhage, unspecified: Secondary | ICD-10-CM | POA: Diagnosis present

## 2023-05-13 DIAGNOSIS — I639 Cerebral infarction, unspecified: Secondary | ICD-10-CM | POA: Diagnosis not present

## 2023-05-13 DIAGNOSIS — Z91018 Allergy to other foods: Secondary | ICD-10-CM

## 2023-05-13 DIAGNOSIS — Z515 Encounter for palliative care: Secondary | ICD-10-CM

## 2023-05-13 DIAGNOSIS — I615 Nontraumatic intracerebral hemorrhage, intraventricular: Principal | ICD-10-CM | POA: Diagnosis present

## 2023-05-13 DIAGNOSIS — R519 Headache, unspecified: Secondary | ICD-10-CM | POA: Diagnosis not present

## 2023-05-13 DIAGNOSIS — E8801 Alpha-1-antitrypsin deficiency: Secondary | ICD-10-CM | POA: Diagnosis present

## 2023-05-13 DIAGNOSIS — Z8041 Family history of malignant neoplasm of ovary: Secondary | ICD-10-CM

## 2023-05-13 DIAGNOSIS — I10 Essential (primary) hypertension: Secondary | ICD-10-CM | POA: Diagnosis not present

## 2023-05-13 DIAGNOSIS — Z885 Allergy status to narcotic agent status: Secondary | ICD-10-CM

## 2023-05-13 DIAGNOSIS — G9389 Other specified disorders of brain: Secondary | ICD-10-CM | POA: Diagnosis not present

## 2023-05-13 DIAGNOSIS — Z882 Allergy status to sulfonamides status: Secondary | ICD-10-CM

## 2023-05-13 DIAGNOSIS — R0989 Other specified symptoms and signs involving the circulatory and respiratory systems: Secondary | ICD-10-CM | POA: Diagnosis not present

## 2023-05-13 DIAGNOSIS — Z4659 Encounter for fitting and adjustment of other gastrointestinal appliance and device: Secondary | ICD-10-CM | POA: Diagnosis not present

## 2023-05-13 DIAGNOSIS — Z853 Personal history of malignant neoplasm of breast: Secondary | ICD-10-CM | POA: Diagnosis not present

## 2023-05-13 DIAGNOSIS — Z800 Family history of malignant neoplasm of digestive organs: Secondary | ICD-10-CM

## 2023-05-13 DIAGNOSIS — M1990 Unspecified osteoarthritis, unspecified site: Secondary | ICD-10-CM | POA: Diagnosis present

## 2023-05-13 DIAGNOSIS — G935 Compression of brain: Secondary | ICD-10-CM | POA: Diagnosis not present

## 2023-05-13 DIAGNOSIS — Z880 Allergy status to penicillin: Secondary | ICD-10-CM

## 2023-05-13 DIAGNOSIS — Z803 Family history of malignant neoplasm of breast: Secondary | ICD-10-CM

## 2023-05-13 DIAGNOSIS — Z7982 Long term (current) use of aspirin: Secondary | ICD-10-CM

## 2023-05-13 LAB — CBG MONITORING, ED
Glucose-Capillary: 280 mg/dL — ABNORMAL HIGH (ref 70–99)
Glucose-Capillary: 309 mg/dL — ABNORMAL HIGH (ref 70–99)

## 2023-05-13 LAB — ETHANOL
Alcohol, Ethyl (B): <10 mg/dL (ref <10)
Alcohol, Ethyl (B): <10 mg/dL (ref <10)

## 2023-05-13 LAB — COMPREHENSIVE METABOLIC PANEL
ALT: 39 U/L (ref 0–44)
AST: 40 U/L (ref 15–41)
Albumin: 4.1 g/dL (ref 3.5–5.0)
Alkaline Phosphatase: 94 U/L (ref 38–126)
Anion gap: 11 (ref 5–15)
BUN: 16 mg/dL (ref 8–23)
CO2: 22 mmol/L (ref 22–32)
Calcium: 9.4 mg/dL (ref 8.9–10.3)
Chloride: 99 mmol/L (ref 98–111)
Creatinine, Ser: 0.59 mg/dL (ref 0.44–1.00)
GFR, Estimated: >60 mL/min (ref >60)
Glucose, Bld: 295 mg/dL — ABNORMAL HIGH (ref 70–99)
Potassium: 3.9 mmol/L (ref 3.5–5.1)
Sodium: 132 mmol/L — ABNORMAL LOW (ref 135–145)
Total Bilirubin: 1 mg/dL (ref 0.0–1.2)
Total Protein: 8.1 g/dL (ref 6.5–8.1)

## 2023-05-13 LAB — CBC
HCT: 47.2 % — ABNORMAL HIGH (ref 36.0–46.0)
Hemoglobin: 16.5 g/dL — ABNORMAL HIGH (ref 12.0–15.0)
MCH: 31.3 pg (ref 26.0–34.0)
MCHC: 35 g/dL (ref 30.0–36.0)
MCV: 89.6 fL (ref 80.0–100.0)
Platelets: 308 10*3/uL (ref 150–400)
RBC: 5.27 MIL/uL — ABNORMAL HIGH (ref 3.87–5.11)
RDW: 11.9 % (ref 11.5–15.5)
WBC: 13.2 10*3/uL — ABNORMAL HIGH (ref 4.0–10.5)
nRBC: 0 % (ref 0.0–0.2)

## 2023-05-13 LAB — POCT I-STAT 7, (LYTES, BLD GAS, ICA,H+H)
Acid-base deficit: 3 mmol/L — ABNORMAL HIGH (ref 0.0–2.0)
Bicarbonate: 23.8 mmol/L (ref 20.0–28.0)
Calcium, Ion: 1.28 mmol/L (ref 1.15–1.40)
HCT: 42 % (ref 36.0–46.0)
Hemoglobin: 14.3 g/dL (ref 12.0–15.0)
O2 Saturation: 99 %
Patient temperature: 97.4
Potassium: 4.2 mmol/L (ref 3.5–5.1)
Sodium: 143 mmol/L (ref 135–145)
TCO2: 25 mmol/L (ref 22–32)
pCO2 arterial: 44.9 mm[Hg] (ref 32–48)
pH, Arterial: 7.329 — ABNORMAL LOW (ref 7.35–7.45)
pO2, Arterial: 129 mm[Hg] — ABNORMAL HIGH (ref 83–108)

## 2023-05-13 LAB — PROTIME-INR
INR: 1 (ref 0.8–1.2)
Prothrombin Time: 13.8 s (ref 11.4–15.2)

## 2023-05-13 LAB — HIV ANTIBODY (ROUTINE TESTING W REFLEX): HIV Screen 4th Generation wRfx: NONREACTIVE

## 2023-05-13 LAB — DIFFERENTIAL
Abs Immature Granulocytes: 0.05 10*3/uL (ref 0.00–0.07)
Basophils Absolute: 0.1 10*3/uL (ref 0.0–0.1)
Basophils Relative: 1 %
Eosinophils Absolute: 0.1 10*3/uL (ref 0.0–0.5)
Eosinophils Relative: 1 %
Immature Granulocytes: 0 %
Lymphocytes Relative: 20 %
Lymphs Abs: 2.6 10*3/uL (ref 0.7–4.0)
Monocytes Absolute: 0.7 10*3/uL (ref 0.1–1.0)
Monocytes Relative: 5 %
Neutro Abs: 9.7 10*3/uL — ABNORMAL HIGH (ref 1.7–7.7)
Neutrophils Relative %: 73 %

## 2023-05-13 LAB — LIPID PANEL
Cholesterol: 194 mg/dL (ref 0–200)
HDL: 47 mg/dL (ref >40)
LDL Cholesterol: 110 mg/dL — ABNORMAL HIGH (ref 0–99)
Total CHOL/HDL Ratio: 4.1 {ratio}
Triglycerides: 184 mg/dL — ABNORMAL HIGH (ref <150)
VLDL: 37 mg/dL (ref 0–40)

## 2023-05-13 LAB — SODIUM: Sodium: 141 mmol/L (ref 135–145)

## 2023-05-13 LAB — MRSA NEXT GEN BY PCR, NASAL: MRSA by PCR Next Gen: NOT DETECTED

## 2023-05-13 LAB — APTT: aPTT: 24 s (ref 24–36)

## 2023-05-13 LAB — GLUCOSE, CAPILLARY: Glucose-Capillary: 200 mg/dL — ABNORMAL HIGH (ref 70–99)

## 2023-05-13 MED ORDER — GLYCOPYRROLATE 0.2 MG/ML IJ SOLN: 0.2000 mg | INTRAMUSCULAR | Status: DC | PRN

## 2023-05-13 MED ORDER — ONDANSETRON 4 MG PO TBDP
4.0000 mg | ORAL_TABLET | Freq: Once | ORAL | Status: AC
  Administered 2023-05-13: 4 mg via ORAL
  Filled 2023-05-13: qty 1

## 2023-05-13 MED ORDER — FENTANYL CITRATE PF 50 MCG/ML IJ SOSY
50.0000 ug | PREFILLED_SYRINGE | Freq: Once | INTRAMUSCULAR | Status: AC
  Administered 2023-05-13: 50 ug via INTRAVENOUS

## 2023-05-13 MED ORDER — POLYVINYL ALCOHOL 1.4 % OP SOLN: 1.0000 [drp] | Freq: Four times a day (QID) | OPHTHALMIC | Status: DC | PRN

## 2023-05-13 MED ORDER — PROPOFOL 1000 MG/100ML IV EMUL
5.0000 ug/kg/min | INTRAVENOUS | Status: DC
  Administered 2023-05-13: 50 ug/kg/min via INTRAVENOUS

## 2023-05-13 MED ORDER — FAMOTIDINE 20 MG PO TABS: 20.0000 mg | ORAL_TABLET | Freq: Two times a day (BID) | ORAL | Status: DC

## 2023-05-13 MED ORDER — POLYETHYLENE GLYCOL 3350 17 G PO PACK: 17.0000 g | PACK | Freq: Every day | ORAL | Status: DC

## 2023-05-13 MED ORDER — STROKE: EARLY STAGES OF RECOVERY BOOK: Freq: Once | Status: DC

## 2023-05-13 MED ORDER — MORPHINE 100MG IN NS 100ML (1MG/ML) PREMIX INFUSION
0.0000 mg/h | INTRAVENOUS | Status: DC
  Administered 2023-05-13: 5 mg/h via INTRAVENOUS
  Administered 2023-05-14 – 2023-05-15 (×3): 10 mg/h via INTRAVENOUS
  Filled 2023-05-13 (×4): qty 100

## 2023-05-13 MED ORDER — SENNOSIDES-DOCUSATE SODIUM 8.6-50 MG PO TABS: 1.0000 | ORAL_TABLET | Freq: Two times a day (BID) | ORAL | Status: DC

## 2023-05-13 MED ORDER — IOHEXOL 350 MG/ML SOLN
75.0000 mL | Freq: Once | INTRAVENOUS | Status: AC | PRN
  Administered 2023-05-13: 75 mL via INTRAVENOUS

## 2023-05-13 MED ORDER — SODIUM CHLORIDE 0.9% FLUSH: 3.0000 mL | INTRAVENOUS | Status: DC | PRN

## 2023-05-13 MED ORDER — PROPOFOL 1000 MG/100ML IV EMUL
0.0000 ug/kg/min | INTRAVENOUS | Status: DC
  Administered 2023-05-13: 50 ug/kg/min via INTRAVENOUS
  Administered 2023-05-13: 30 ug/kg/min via INTRAVENOUS
  Filled 2023-05-13 (×3): qty 100

## 2023-05-13 MED ORDER — PROPOFOL 1000 MG/100ML IV EMUL: 5.0000 ug/kg/min | INTRAVENOUS | Status: DC

## 2023-05-13 MED ORDER — DOCUSATE SODIUM 50 MG/5ML PO LIQD: 100.0000 mg | Freq: Two times a day (BID) | ORAL | Status: DC

## 2023-05-13 MED ORDER — FENTANYL CITRATE PF 50 MCG/ML IJ SOSY
50.0000 ug | PREFILLED_SYRINGE | Freq: Once | INTRAMUSCULAR | Status: AC
  Administered 2023-05-13: 50 ug via INTRAVENOUS
  Filled 2023-05-13: qty 1

## 2023-05-13 MED ORDER — PANTOPRAZOLE SODIUM 40 MG IV SOLR
40.0000 mg | Freq: Every day | INTRAVENOUS | Status: DC
  Administered 2023-05-13: 40 mg via INTRAVENOUS
  Filled 2023-05-13: qty 10

## 2023-05-13 MED ORDER — SODIUM CHLORIDE 3 % IV SOLN
INTRAVENOUS | Status: DC
  Filled 2023-05-13: qty 500

## 2023-05-13 MED ORDER — FENTANYL 2500MCG IN NS 250ML (10MCG/ML) PREMIX INFUSION
50.0000 ug/h | INTRAVENOUS | Status: DC
  Administered 2023-05-13: 25 ug/h via INTRAVENOUS
  Filled 2023-05-13: qty 250

## 2023-05-13 MED ORDER — SODIUM CHLORIDE 3 % IV SOLN
INTRAVENOUS | Status: DC
  Filled 2023-05-13 (×2): qty 500

## 2023-05-13 MED ORDER — FENTANYL CITRATE PF 50 MCG/ML IJ SOSY
PREFILLED_SYRINGE | INTRAMUSCULAR | Status: AC
  Filled 2023-05-13: qty 1

## 2023-05-13 MED ORDER — ETOMIDATE 2 MG/ML IV SOLN
25.0000 mg | Freq: Once | INTRAVENOUS | Status: AC
Start: 2023-05-13 — End: 2023-05-13
  Administered 2023-05-13: 20 mg via INTRAVENOUS
  Filled 2023-05-13: qty 20

## 2023-05-13 MED ORDER — LORAZEPAM 2 MG/ML IJ SOLN: 2.0000 mg | INTRAMUSCULAR | Status: DC | PRN

## 2023-05-13 MED ORDER — ORAL CARE MOUTH RINSE
15.0000 mL | OROMUCOSAL | Status: DC | PRN
Start: 2023-05-13 — End: 2023-05-15
  Administered 2023-05-13: 15 mL via OROMUCOSAL

## 2023-05-13 MED ORDER — ACETAMINOPHEN 160 MG/5ML PO SOLN: 650.0000 mg | ORAL | Status: DC | PRN

## 2023-05-13 MED ORDER — GLYCOPYRROLATE 1 MG PO TABS: 1.0000 mg | ORAL_TABLET | ORAL | Status: DC | PRN

## 2023-05-13 MED ORDER — INSULIN ASPART 100 UNIT/ML IJ SOLN
0.0000 [IU] | INTRAMUSCULAR | Status: DC
  Administered 2023-05-13: 3 [IU] via SUBCUTANEOUS

## 2023-05-13 MED ORDER — PANTOPRAZOLE SODIUM 40 MG IV SOLR: 40.0000 mg | Freq: Every day | INTRAVENOUS | Status: DC

## 2023-05-13 MED ORDER — ACETAMINOPHEN 650 MG RE SUPP: 650.0000 mg | RECTAL | Status: DC | PRN

## 2023-05-13 MED ORDER — CHLORHEXIDINE GLUCONATE CLOTH 2 % EX PADS
6.0000 | MEDICATED_PAD | Freq: Every day | CUTANEOUS | Status: DC
  Administered 2023-05-13 – 2023-05-14 (×2): 6 via TOPICAL

## 2023-05-13 MED ORDER — ACETAMINOPHEN 325 MG PO TABS: 650.0000 mg | ORAL_TABLET | ORAL | Status: DC | PRN

## 2023-05-13 MED ORDER — PROPOFOL 1000 MG/100ML IV EMUL
INTRAVENOUS | Status: AC
  Administered 2023-05-13: 50 ug/kg/min via INTRAVENOUS
  Filled 2023-05-13: qty 100

## 2023-05-13 MED ORDER — GLYCOPYRROLATE 0.2 MG/ML IJ SOLN
0.2000 mg | INTRAMUSCULAR | Status: DC | PRN
  Administered 2023-05-13 – 2023-05-15 (×6): 0.2 mg via INTRAVENOUS
  Filled 2023-05-13 (×7): qty 1

## 2023-05-13 MED ORDER — MORPHINE BOLUS VIA INFUSION
5.0000 mg | INTRAVENOUS | Status: DC | PRN
Start: 2023-05-13 — End: 2023-05-15

## 2023-05-13 MED ORDER — CLEVIDIPINE BUTYRATE 0.5 MG/ML IV EMUL
0.0000 mg/h | INTRAVENOUS | Status: DC
  Administered 2023-05-13: 2 mg/h via INTRAVENOUS
  Filled 2023-05-13: qty 50

## 2023-05-13 MED ORDER — FENTANYL BOLUS VIA INFUSION: 50.0000 ug | INTRAVENOUS | Status: DC | PRN

## 2023-05-13 MED ORDER — SODIUM CHLORIDE 0.9% FLUSH: 3.0000 mL | Freq: Once | INTRAVENOUS | Status: DC

## 2023-05-13 MED ORDER — SODIUM CHLORIDE 0.9% FLUSH: 3.0000 mL | Freq: Two times a day (BID) | INTRAVENOUS | Status: DC

## 2023-05-13 MED ORDER — CLEVIDIPINE BUTYRATE 0.5 MG/ML IV EMUL
0.0000 mg/h | INTRAVENOUS | Status: DC
  Administered 2023-05-13: 4 mg/h via INTRAVENOUS
  Filled 2023-05-13: qty 50

## 2023-05-13 MED ORDER — ORAL CARE MOUTH RINSE
15.0000 mL | OROMUCOSAL | Status: DC
  Administered 2023-05-13 (×2): 15 mL via OROMUCOSAL

## 2023-05-13 MED ORDER — SUCCINYLCHOLINE CHLORIDE 200 MG/10ML IV SOSY
100.0000 mg | PREFILLED_SYRINGE | Freq: Once | INTRAVENOUS | Status: AC
Start: 2023-05-13 — End: 2023-05-13
  Administered 2023-05-13: 100 mg via INTRAVENOUS
  Filled 2023-05-13: qty 10

## 2023-05-13 MED ORDER — SODIUM CHLORIDE 3 % IV BOLUS
250.0000 mL | Freq: Once | INTRAVENOUS | Status: AC
  Administered 2023-05-13: 250 mL via INTRAVENOUS
  Filled 2023-05-13: qty 500

## 2023-05-13 NOTE — ED Notes (Signed)
 EMTALA reviewed by this RN.

## 2023-05-13 NOTE — Progress Notes (Signed)
 Telestroke cart was activated at 1202. Per treatment team, pt's LKW was at 1150, ROS 1200 with c/o AMS. Dr Suzanne, EDP at bedside assessing patient at 1203. Pt transported to CT prior to cart activation. [facility] was paged for code stroke at 3. Dr. Matthews, was at bedside at 1208 to assess the patient. Based on [facility]'s assessment, pt does not meet criteria for emergent interventions at this time 2/2 ICH noted on NCCT. Dr. Matthews to f/u with EDP regarding recommendations. No further needs from Telestroke RN at this time. Telestroke cart disconnected at 1210.  mRS- 1

## 2023-05-13 NOTE — Progress Notes (Signed)
 1900 - Pt's family decided to move to comfort care. CCM and Neuro notified.   1936 - Comfort care orders placed by MD. Chaplain called. Chaplain at bedside.   2100 - [facility] notified.   2200 - Family request fro priest at bedside for last rights. Family located priest who is en route.   2300 - Priest at bedside with family.   2342 - RT extubates pt. Comfort care measures taken.

## 2023-05-13 NOTE — Consult Note (Signed)
 NAME:  Danielle Wallace, MRN:  [id number], DOB:  [birth year], LOS: 0 ADMISSION DATE:  05/13/2023, CONSULTATION DATE: 05/13/2023 REFERRING MD: Dr. Matthews, CHIEF COMPLAINT: Ventilator dependence  History of Present Illness:  63 year old woman with a history of alpha-1 antitrypsin deficiency with cirrhosis and obstructive lung disease. Also breast cancer (2015), renal cell carcinoma (right cryosurgery), diabetes. Presented to the ED with headache 05/13/2023 but rapidly deteriorated after arrival, developed aphasia and left hemiplegia.  Head CT revealed an acute left thalamic intraparenchymal hemorrhage with intraventricular extension without hydrocephalus as well as an acute right basal ganglia hematoma with mild mass effect on the right lateral ventricle but no midline shift.  She required intubation for airway protection.  Pertinent  Medical History   Past Medical History:  Diagnosis Date   Alpha-1-antitrypsin deficiency (HCC)    per patient   Arthritis 2006   Asthma    Breast cancer (HCC) 10/14/2013   18 mm, T1c, N0; ER/ PR positive,her 2 neu overexpressed. Adjuvant chemo/ herceptin .   Cancer [facility]) 2006   Renal cell carcinoma; cryosurgery treatment, right side   Cirrhosis (HCC)    Collapsed lung 2007   Diabetes mellitus without complication (HCC) 2006   Metformin    Diffuse cystic mastopathy    Family history of breast cancer    Family history of liver cancer    Motion sickness    any moving vehicle   Orthodontics    braces   Personal history of chemotherapy    Sinus problem      Significant Hospital Events: Including procedures, antibiotic start and stop dates in addition to other pertinent events   Head CT 1/1 > acute left thalamic intraparenchymal hemorrhage with ventricular extension and no hydrocephalus.  Acute right basal ganglia ICH with mild mass effect on the right lateral ventricle with no midline shift CT angio head 1/1 > no change in left thalamic and right basal ganglier  ICH.  No midline shift.  No evidence to support active extravasation.  No intracranial vessel occlusion, aneurysm or hemodynamically significant stenosis seen  Interim History / Subjective:  In the ICU sedated and ventilated  Objective   There were no vitals taken for this visit.    Vent Mode: PRVC FiO2 (%):  [40 %] 40 % Set Rate:  [20 bmp] 20 bmp Vt Set:  [450 mL] 450 mL PEEP:  [5 cmH20] 5 cmH20 Plateau Pressure:  [16 cmH20] 16 cmH20  No intake or output data in the 24 hours ending 05/13/23 1456 There were no vitals filed for this visit.  Examination: General: Ill-appearing woman, sedated, ventilated HENT: ET tube in good position Lungs: Clear bilaterally Cardiovascular: Regular, no murmur Abdomen: Obese, nondistended with positive bowel sounds Extremities: No edema Neuro: Pupils 3 mm and symmetrical, react to light.  Sedated, does not wake to voice, grimace with stimulation.  Moves her right arm purposefully.  No movement in the left arm.  Triple flexion to pain in bilateral lower extremities  Chest x-ray 1/1 reviewed by me shows endotracheal tube is 3 cm above the carina, gastric tube is in the stomach, some basilar atelectasis without consolidation.  Resolved Hospital Problem list     Assessment & Plan:   Left thalamic and right basal ganglier intracranial hemorrhage Acute encephalopathy and impaired airway protection -All anticoagulation held -HOB 30 degrees -Neuroprotective measures protocol -3% saline ordered -Planning for repeat head CT at the 6-hour mark -Will involve neurosurgery if any evidence of evolving hydrocephalus  Ventilator dependence due to impaired  airway protection -PRVC 8 cc/kg -VAP prevention order set -Pulmonary hygiene -Chest x-ray reviewed, check ABG 1 hour postintubation -Evaluate for PSV 1/2 but no plans for wake-up assessment or extubation until we ensure neurological stability,  Alpha-1 antitrypsin deficiency with asthma and  cirrhosis -Albuterol  every 4 hours as needed -Hold home propranolol , spironolactone , Lasix  for now.  Will determine timing to repeat start based on resolution of acute process  Diabetes mellitus -On Tresiba  long-acting insulin , semaglutide  weekly as an outpatient.  Plan to hold -Sliding scale insulin  as per protocol  Depression -Restart duloxetine , buspirone  when stabilized neurologically  At risk malnutrition -Assess for tube feeding if we do not believe she is going to be extubated quickly  Best Practice (right click and Reselect all SmartList Selections daily)   Diet/type: NPO DVT prophylaxis SCD Pressure ulcer(s): N/A GI prophylaxis: PPI Lines: N/A Foley:  Yes, and it is still needed Code Status:  full code Last date of multidisciplinary goals of care discussion [pending]  Labs   CBC: Recent Labs  Lab 05/13/23 1215  WBC 13.2*  NEUTROABS 9.7*  HGB 16.5*  HCT 47.2*  MCV 89.6  PLT 308    Basic Metabolic Panel: Recent Labs  Lab 05/13/23 1215  NA 132*  K 3.9  CL 99  CO2 22  GLUCOSE 295*  BUN 16  CREATININE 0.59  CALCIUM  9.4   GFR: Estimated Creatinine Clearance: 81.7 mL/min (by C-G formula based on SCr of 0.59 mg/dL). Recent Labs  Lab 05/13/23 1215  WBC 13.2*    Liver Function Tests: Recent Labs  Lab 05/13/23 1215  AST 40  ALT 39  ALKPHOS 94  BILITOT 1.0  PROT 8.1  ALBUMIN 4.1   No results for input(s): LIPASE, AMYLASE in the last 168 hours. No results for input(s): AMMONIA in the last 168 hours.  ABG No results found for: PHART, PCO2ART, PO2ART, HCO3, TCO2, ACIDBASEDEF, O2SAT   Coagulation Profile: Recent Labs  Lab 05/13/23 1215  INR 1.0    Cardiac Enzymes: No results for input(s): CKTOTAL, CKMB, CKMBINDEX, TROPONINI in the last 168 hours.  HbA1C: Hemoglobin A1C  Date/Time Value Ref Range Status  08/26/2022 08:38 AM 6.1 (A) 4.0 - 5.6 % Final  05/26/2022 09:33 AM 6.3 (A) 4.0 - 5.6 % Final   Hgb  A1c MFr Bld  Date/Time Value Ref Range Status  12/25/2022 09:54 AM 6.9 (H) 4.8 - 5.6 % Final    Comment:             Prediabetes: 5.7 - 6.4          Diabetes: >6.4          Glycemic control for adults with diabetes: <7.0     CBG: Recent Labs  Lab 05/13/23 1131 05/13/23 1159  GLUCAP 280* 309*    Review of Systems:   Patient unable to give  Past Medical History:  She,  has a past medical history of Alpha-1-antitrypsin deficiency (HCC), Arthritis (2006), Asthma, Breast cancer (HCC) (10/14/2013), Cancer (HCC) (2006), Cirrhosis (HCC), Collapsed lung (2007), Diabetes mellitus without complication (HCC) (2006), Diffuse cystic mastopathy, Family history of breast cancer, Family history of liver cancer, Motion sickness, Orthodontics, Personal history of chemotherapy, and Sinus problem.   Surgical History:   Past Surgical History:  Procedure Laterality Date   BREAST BIOPSY Right 2005   negative   BREAST BIOPSY Left 08/23/2007   negative   BREAST BIOPSY Left 10/24/2013   positive   BREAST BIOPSY Left 07/2002   neg  BREAST BIOPSY Left 08/23/2007   neg   BREAST SURGERY Left 10/2011   Cyst Aspirationapocrine metaplasia, ductal cells and bone cells, hypo-cellular   BREAST SURGERY Left 2009   ADH on stereotactic biopsy, 1.5 mm focus.   BREAST SURGERY Left 2003   fibrocystic changes with ductal hyperplasia without atypia.   BREAST SURGERY Left 04/11/2014   Removal of implant, debridement chest wall Dr.Coan   CESAREAN SECTION  1991   CHOLECYSTECTOMY  1990   COLONOSCOPY  08/26/2013   Deward Piedmont, M.D. normal.   CRYOABLATION  2005, 2010   CYST REMOVAL NECK  10/2011   Dr. Herminio   ESOPHAGOGASTRODUODENOSCOPY (EGD) WITH PROPOFOL  N/A 01/16/2017   Procedure: ESOPHAGOGASTRODUODENOSCOPY (EGD) WITH PROPOFOL ;  Surgeon: Jinny Carmine, MD;  Location: [facility];  Service: Gastroenterology;  Laterality: N/A;  Diabetic - oral meds   INCISION AND DRAINAGE Left Dec 2015, Feb 2016   Dr Sueann    MASTECTOMY Left 11/24/2013   positive   PORTACATH PLACEMENT  11/24/2013   removal of portacath       Social History:   reports that she has never smoked. She has been exposed to tobacco smoke. She has never used smokeless tobacco. She reports that she does not currently use alcohol . She reports that she does not use drugs.   Family History:  Her family history includes Alpha-1 antitrypsin deficiency in her daughter; Aortic aneurysm in her mother; Breast cancer (age of onset: 8) in her paternal aunt; Diabetes in her brother and mother; Emphysema in her mother; Gout in her brother; Healthy in her son and son; Hemachromatosis in her daughter and father; Hemochromatosis in her paternal uncle; Hypertension in her mother; Liver cancer in her father and paternal uncle; Liver disease in her father; Other in her daughter; Ovarian cancer in her maternal grandmother.   Allergies Allergies  Allergen Reactions   Doxycycline  Shortness Of Breath   Exemestane  Anaphylaxis   Floxin [Ofloxacin] Anaphylaxis   Gluten Meal Anaphylaxis   Levofloxacin Anaphylaxis, Diarrhea, Itching, Nausea Only, Other (See Comments), Palpitations, Shortness Of Breath, Swelling and Tinitus   Linezolid Anaphylaxis   Sesame Oil Anaphylaxis   Taxotere [Docetaxel] Anaphylaxis   Hydrocodone Itching   Invokana [Canagliflozin]    Metformin  And Related     Upset stomach and nausea.   Sulfa Antibiotics Other (See Comments)    blisters Skin blisters bilateral lower extremities Other reaction(s): UNKNOWN Other reaction(s): RASH    Codeine Rash   Penicillins Rash   Vancomycin  Rash     Home Medications  Prior to Admission medications   Medication Sig Start Date End Date Taking? Authorizing Provider  albuterol  (VENTOLIN  HFA) 108 (90 Base) MCG/ACT inhaler Inhale 1-2 puffs into the lungs every 4 (four) hours as needed for wheezing or shortness of breath. 01/27/23   Liana Fish, NP  aspirin  EC 81 MG tablet Take 1 tablet (81  mg total) by mouth daily. Swallow whole. 03/01/21   Darliss Rogue, MD  azithromycin  (ZITHROMAX ) 250 MG tablet Use as directed for 5 days 04/27/23   Liana Fish, NP  B COMPLEX-C PO Take by mouth daily.    [provider]  busPIRone  (BUSPAR ) 10 MG tablet Take 1 tablet (10 mg total) by mouth 2 (two) times daily. 03/17/23   Abernathy, Alyssa, NP  carisoprodol  (SOMA ) 350 MG tablet Take 1 tablet (350 mg total) by mouth 4 (four) times daily as needed for muscle spasms. Patient not taking: Reported on 04/15/2023 02/09/23   Abernathy, Alyssa, NP  chlorpheniramine-HYDROcodone (  TUSSIONEX) 10-8 MG/5ML Take 5 mLs by mouth every 12 (twelve) hours as needed. 05/12/23   Liana Fish, NP  Continuous Glucose Sensor (DEXCOM G7 SENSOR) MISC Apply 1 sensor to the skin every 10 days to monitor glucose for diabetes. 01/27/23   Liana Fish, NP  Cyanocobalamin  (VITAMIN B-12 IJ) Inject as directed every 30 (thirty) days.    [provider]  diphenhydrAMINE  (BENADRYL ) 25 MG tablet Take 25 mg by mouth every 6 (six) hours as needed.    [provider]  DULoxetine  (CYMBALTA ) 20 MG capsule Take 1 capsule (20 mg total) by mouth daily. 01/27/23   Abernathy, Alyssa, NP  fluticasone  (FLONASE ) 50 MCG/ACT nasal spray Place 1 spray into both nostrils daily. 01/21/22   Khan, Fozia M, MD  furosemide  (LASIX ) 20 MG tablet Take 1 tablet (20 mg total) by mouth daily. 03/17/23   Liana Fish, NP  glucose blood (ONETOUCH VERIO) test strip Blood sugar testing done QD and as needed  E11.65 03/12/21   McDonough, Lauren K, PA-C  insulin  degludec (TRESIBA  FLEXTOUCH) 100 UNIT/ML FlexTouch Pen Inject 28 Units into the skin daily. 01/27/23   Liana Fish, NP  Insulin  Pen Needle (BD PEN NEEDLE NANO 2ND GEN) 32G X 4 MM MISC USE AS DIRECTED TO INJECT TRESIBA  DAILY UNDER THE SKIN 12/30/22   Liana Fish, NP  lactulose  (CHRONULAC ) 10 GM/15ML solution TAKE 30 ML TWICE A DAY FOR CONSTIPATION AT A TIME  10/17/22   Khan, Fozia M, MD  LINZESS  290 MCG CAPS capsule TAKE 1 CAPSULE BY MOUTH EVERY DAY BEFORE BREAKFAST 12/16/22   Abernathy, Alyssa, NP  MAGNESIUM PO Take by mouth daily.    [provider]  propranolol  (INDERAL ) 10 MG tablet Take 1 tablet (10 mg total) by mouth 3 (three) times daily. If bp is greater than 140 10/02/21   Khan, Fozia M, MD  propranolol  ER (INDERAL  LA) 60 MG 24 hr capsule Take 1 capsule (60 mg total) by mouth daily. 01/27/23   Abernathy, Fish, NP  Semaglutide , 1 MG/DOSE, (OZEMPIC , 1 MG/DOSE,) 4 MG/3ML SOPN INJECT 1 MG UNDER THE SKIN ONCE A WEEK 06/09/22   Liana Fish, NP  spironolactone  (ALDACTONE ) 25 MG tablet Take 1 tablet (25 mg total) by mouth 2 (two) times daily. 01/27/23   Liana Fish, NP  traMADol  (ULTRAM ) 50 MG tablet TAKE 1 TABLET (50 MG TOTAL) BY MOUTH 2 (TWO) TIMES DAILY AS NEEDED FOR MODERATE PAIN OR SEVERE PAIN. 05/07/23   Liana Fish, NP  VITAMIN D  PO Take 1,000 Units by mouth daily.    [provider]     Critical care time: 40 minutes      Lamar Chris, MD, PhD 05/13/2023, 2:57 PM [facility] [phone] or if no answer before 7:00PM call [phone] For any issues after 7:00PM please call eLink [phone]

## 2023-05-13 NOTE — Inpatient Diabetes Management (Addendum)
 Inpatient Diabetes Program Recommendations  AACE/ADA: New Consensus Statement on Inpatient Glycemic Control (2015)  Target Ranges:  Prepandial:   less than 140 mg/dL      Peak postprandial:   less than 180 mg/dL (1-2 hours)      Critically ill patients:  140 - 180 mg/dL   Lab Results  Component Value Date   GLUCAP 309 (H) 05/13/2023   HGBA1C 6.9 (H) 12/25/2022    Review of Glycemic Control  Diabetes history: DM2 Outpatient Diabetes medications: Tresiba  28 units daily, Semaglutide  1 mg weekly Current orders for Inpatient glycemic control: none  Inpatient Diabetes Program Recommendations:   Noted that patient's blood sugars have been greater than 180 mg/dl.   Recommend adding Novolog  0-9 units correction scale every 4 hours since blood sugars have been elevated.  Patient needs to be on ICU glycemic control order set.  Marjorie Lunger RN BSN CDE Diabetes Coordinator Pager: [phone]  8am-5pm

## 2023-05-13 NOTE — Progress Notes (Signed)
   05/13/23 2015  Spiritual Encounters  Type of Visit Initial  Care provided to: Pt and family  Conversation partners present during encounter Nurse  Referral source Nurse (RN/NT/LPN)  Reason for visit End-of-life  OnCall Visit Yes   Reason for Visit: Chaplain responding to call from RN stating that Pt was about to be transitioned to comfort care and the family would like a chaplain to come and pray.  Time of Visit: 25 Minutes  Description of Visit: Chaplain arrived to find Pt's bed surrounded by loving family.  Chaplain establed compassionate presence through empathetic listening, compassionate touch, and by facilitating storytelling.  Family all began to share stories and memories that highlighted the character of their mother/mother-in-law/wife/sister.  Chaplain listened.  Family stated that Pt appreciated the [employer] of Last Rites.  Chaplain explained that I am not Catholic but could pray.  Chaplain offered prayer.  After leaving the room, RN called back and stated the family would like to know if we could find a [employer] to do last rites.  Chaplain contacted Fr. Marinell and is waiting for a return call.  Plan of Care: Chaplain will continue to follow the case throughout the shift, and pas on to on-coming overnight chaplain.  Chaplain services remain available by Spiritual Consult or for emergent cases, paging [phone] Chaplain Maude Roll, MDiv [email or web address] [phone]

## 2023-05-13 NOTE — IPAL (Signed)
 Goals of Care Family Meeting   Date carried out:: 05/13/2023  Location of the meeting: Conference room 4N  Member's involved: Dr. Vanessa, NP Wallace, Patient's husband, daughter, 2 sons, 2 daughters-in-law, 2 sisters-in law  Durable Power of Attorney or acting medical decision maker: Husband, Danielle Wallace    Discussion: We discussed goals of care for [name] .    Code status: Full DNR  Disposition: In-patient comfort care. Family has taken the time to say their goodbyes individually and have now decided to transition patient to comfort care measures.   Time spent for the meeting: 45 minutes   Danielle JAYSON Judithe, DNP, AGACNP-BC [facility]  05/13/2023, 7:24 PM

## 2023-05-13 NOTE — Progress Notes (Signed)
 PHARMACY CONSULT NOTE - Hypertonic Saline  Pharmacy Consult for Hypertonic Saline Monitoring and Management  Recent Labs: Potassium (mmol/L)  Date Value  05/13/2023 3.9  09/04/2014 4.0   Magnesium (mg/dL)  Date Value  96/82/7976 1.7  04/10/2014 1.8   Calcium  (mg/dL)  Date Value  98/98/7974 9.4   Calcium , Total (mg/dL)  Date Value  95/74/7983 9.7   Albumin (g/dL)  Date Value  98/98/7974 4.1  01/06/2022 3.8 (L)  09/04/2014 4.1   Sodium (mmol/L)  Date Value  05/13/2023 132 (L)  01/06/2022 138  09/04/2014 138   Assessment  Danielle Wallace is a 63 y.o. female presenting with headache, nausea and vomiting but found to have ICH on CT imaging. PMH significant for T2DM, arthritis, asthma, history of breast cancer, and history of renal cell carcinoma. Pharmacy has been consulted to monitor hypertonic saline (3%) infusion.  Pertinent medications: N/A  Baseline Lab(s): Serum Na 132  Goal of Therapy:  Goal Sodium level: 150-155 Increase in Na by 4-6 mEq/L in 4-6 hours Do not exceed increase in Na by 10-12 mEq/L in 24 hours  Monitoring:  Date Time Na Rate/Comment  0101 1215 132 Baseline Na **HTS started: 12:33 at 50 mL/hour**  Plan:  Give 3% saline bolus of 250 mL x1 Initiate 3% saline at 50 mL/hr Check Na Q2H x2 then Q4H  Stop infusion if  Na increases > 4 mEq/L in first 2 hours Na increases > 6 mEq/L in first 4 hours Na increases > 6 mEq/L in 6 hours  Na increases > 8 mEq/L in 8 hours (give D5W bolus) Continue to monitor for signs of clinical improvement and recommendations per nephrology  Thank you for involving pharmacy in this patient's care.   Damien Napoleon, PharmD Clinical Pharmacist 05/13/2023 12:55 PM

## 2023-05-13 NOTE — Progress Notes (Signed)
 Called in regards to this patient's CT scans. She does have extensive left thalamic hemorrhage with IVH as well as right basal ganglia hemorrhage. Her follow up CT shows some mild increase in size of ventricles, however, I do not think she necessarily needs an EVD right now. She does have some extension of her hemorrhage which would likely explain her neurologic decline. I think that her hemorrhage is the primary cause of her neurologic status and not the mild increase in ventricular size. I discussed this plan of care with Dr. Onetha and he agrees with this.

## 2023-05-13 NOTE — ED Provider Notes (Signed)
 [facility] Provider Note    Event Date/Time   First MD Initiated Contact with Patient 05/13/23 1202     (approximate)   History   Headache, Nausea, and Emesis   HPI  Danielle Wallace is a 63 y.o. female past medical history significant for hypertension, who presents to the emergency department for headache and vomiting.  States that she started having headache and vomiting today.  Initially was alert and oriented when checking into the emergency department and stated she had taken an extra blood pressure medication.  Husband called out because she had a change of her mentation.  Patient with altered mental status and minimally responsive, another episode of vomiting.  Not on anticoagulation.     Physical Exam   Triage Vital Signs: ED Triage Vitals  Encounter Vitals Group     BP 05/13/23 1132 (!) 171/90     Systolic BP Percentile --      Diastolic BP Percentile --      Pulse Rate 05/13/23 1132 82     Resp 05/13/23 1132 20     Temp 05/13/23 1132 (!) 97.5 F (36.4 C)     Temp src --      SpO2 05/13/23 1132 100 %     Weight --      Height --      Head Circumference --      Peak Flow --      Pain Score 05/13/23 1133 8     Pain Loc --      Pain Education --      Exclude from Growth Chart --     Most recent vital signs: Vitals:   05/13/23 1318 05/13/23 1321  BP: 134/67 116/63  Pulse: 89   Resp: (!) 23   Temp: (!) 97.4 F (36.3 C)   SpO2: 99%     Physical Exam Constitutional:      Appearance: She is ill-appearing.  HENT:     Head: Atraumatic.  Eyes:     Extraocular Movements: Extraocular movements intact.     Conjunctiva/sclera: Conjunctivae normal.  Cardiovascular:     Rate and Rhythm: Regular rhythm.  Pulmonary:     Effort: No respiratory distress.  Abdominal:     General: There is no distension.  Musculoskeletal:        General: Normal range of motion.     Cervical back: Normal range of motion.  Skin:    General: Skin is  warm.  Neurological:     Mental Status: She is alert.     GCS: GCS eye subscore is 4.     Comments: Opens her eyes spontaneously.  Slurred speech.  Obvious facial droop to the left side.  Not oriented.     IMPRESSION / MDM / ASSESSMENT AND PLAN / ED COURSE  I reviewed the triage vital signs and the nursing notes.  Presents to the emergency department following an episode of altered mental status.  Patient initially complaining of headache and vomiting.  Patient was last known well approximately 20 minutes.  Activated code stroke and immediately taken to CT scanner.  Neurology with Dr. Matthews immediately available.  CT scan of the head with large intraparenchymal hemorrhage concerning for hemorrhagic stroke.  Dr. Matthews wanted CTA of the head.  Taken back to the room, patient not protecting her airway, will proceed with intubation.  Started on hypertonic saline and Cleviprex  with blood pressure goal of less than 140 systolic  RADIOLOGY I independently  reviewed imaging, my interpretation of imaging: Large intraparenchymal hemorrhage  LABS (all labs ordered are listed, but only abnormal results are displayed) Labs interpreted as -    Labs Reviewed  CBC - Abnormal; Notable for the following components:      Result Value   WBC 13.2 (*)    RBC 5.27 (*)    Hemoglobin 16.5 (*)    HCT 47.2 (*)    All other components within normal limits  DIFFERENTIAL - Abnormal; Notable for the following components:   Neutro Abs 9.7 (*)    All other components within normal limits  COMPREHENSIVE METABOLIC PANEL - Abnormal; Notable for the following components:   Sodium 132 (*)    Glucose, Bld 295 (*)    All other components within normal limits  CBG MONITORING, ED - Abnormal; Notable for the following components:   Glucose-Capillary 280 (*)    All other components within normal limits  CBG MONITORING, ED - Abnormal; Notable for the following components:   Glucose-Capillary 309 (*)    All other  components within normal limits  PROTIME-INR  APTT  ETHANOL  SODIUM  SODIUM  SODIUM     MDM  Patient presented to the emergency department for headache and vomiting and had sudden onset of change in mental status.  Made a code stroke and immediately taken to CT scanner.  Found to have acute hemorrhagic stroke.  Patient was intubated for airway protection.  Started on hypertonic saline, Cleviprex  for blood pressure control, patient is not on anticoagulation.  Patient was started on propofol  and fentanyl  post sedation.  Consulted and discussed the patient's case with neurology who is at bedside, patient was transferred to neuro ICU at [facility] in stable but serious condition.     PROCEDURES:  Critical Care performed: yes  .Critical Care  Performed by: Suzanne Kirsch, MD Authorized by: Suzanne Kirsch, MD   Critical care provider statement:    Critical care time (minutes):  50   Critical care time was exclusive of:  Separately billable procedures and treating other patients   Critical care was necessary to treat or prevent imminent or life-threatening deterioration of the following conditions:  Circulatory failure   Critical care was time spent personally by me on the following activities:  Development of treatment plan with patient or surrogate, discussions with consultants, evaluation of patient's response to treatment, examination of patient, ordering and review of laboratory studies, ordering and review of radiographic studies, ordering and performing treatments and interventions, pulse oximetry, re-evaluation of patient's condition and review of old charts   Care discussed with: admitting provider   Procedure Name: Intubation Date/Time: 05/13/2023 3:55 PM  Performed by: Suzanne Kirsch, MDPre-anesthesia Checklist: Suction available, Emergency Drugs available, Patient identified, Patient being monitored and Timeout performed Oxygen  Delivery Method: Ambu bag Preoxygenation: Pre-oxygenation  with 100% oxygen  Induction Type: Rapid sequence Ventilation: Mask ventilation without difficulty Laryngoscope Size: Glidescope and 3 Grade View: Grade I Tube size: 7.5 mm Number of attempts: 1 Airway Equipment and Method: Video-laryngoscopy Placement Confirmation: ETT inserted through vocal cords under direct vision, Positive ETCO2, CO2 detector and Breath sounds checked- equal and bilateral Secured at: 24 cm Tube secured with: ETT holder Dental Injury: Teeth and Oropharynx as per pre-operative assessment  Future Recommendations: Recommend- induction with short-acting agent, and alternative techniques readily available      Patient's presentation is most consistent with acute presentation with potential threat to life or bodily function.   MEDICATIONS ORDERED IN ED: Medications  ondansetron  (ZOFRAN -ODT)  disintegrating tablet 4 mg (4 mg Oral Given 05/13/23 1151)  iohexol  (OMNIPAQUE ) 350 MG/ML injection 75 mL (75 mLs Intravenous Contrast Given 05/13/23 1219)  etomidate  (AMIDATE ) injection 25 mg (20 mg Intravenous Given 05/13/23 1228)  succinylcholine  (ANECTINE ) syringe 100 mg (100 mg Intravenous Given 05/13/23 1229)  sodium chloride  3% (hypertonic) IV bolus 250 mL (0 mLs Intravenous Stopped 05/13/23 1309)  fentaNYL  (SUBLIMAZE ) injection 50 mcg (50 mcg Intravenous Given 05/13/23 1247)  fentaNYL  (SUBLIMAZE ) injection 50 mcg (50 mcg Intravenous Given 05/13/23 1315)    FINAL CLINICAL IMPRESSION(S) / ED DIAGNOSES   Final diagnoses:  Intraparenchymal hemorrhage of brain (HCC)     Rx / DC Orders   ED Discharge Orders     None        Note:  This document was prepared using Dragon voice recognition software and may include unintentional dictation errors.   Suzanne Kirsch, MD 05/13/23 1556

## 2023-05-13 NOTE — Progress Notes (Signed)
 Ventilator patient transported from 4N32 to CT2 and back without any complications.

## 2023-05-13 NOTE — Consult Note (Signed)
 NEUROLOGY CONSULT NOTE   Date of service: May 13, 2023 Patient Name: Danielle Wallace MRN:  [id number] DOB:  10-Dec-1960 Chief Complaint: code stroke Requesting Provider: Suzanne Kirsch, MD  History of Present Illness  Danielle Wallace is a 63 y.o. female  has a past medical history of Alpha-1-antitrypsin deficiency (HCC), Arthritis (2006), Asthma, Breast cancer (HCC) (10/14/2013), Cancer (HCC) (2006), Cirrhosis (HCC), Collapsed lung (2007), Diabetes mellitus without complication (HCC) (2006), Diffuse cystic mastopathy, Family history of breast cancer, Family history of liver cancer, Motion sickness, Orthodontics, Personal history of chemotherapy, and Sinus problem.   She initially presented to the hospital this morning c/o headache. At that time she was able to speak and walk. Her condition quickly deteriorated after arrival and she became unable to speak or move her left side. Stroke code was called. NIHSS = 26.  Personal review of head CT showed an acute intraparenchymal hemorrhage in the left thalamus with intraventricular extension without hydrocephalus.  There is also an acute intraparenchymal hematoma in the right basal ganglia approximately 15 mL with mild mass effect on the right lateral ventricle but no significant midline shift.  TNK was not administered due to hemorrhage.  CTA personal review and discussion with radiology no sign of aneurysm or vascular malformation.  She was subsequently intubated for airway protection.   LKW: unclear, sometime this morning Modified rankin score: 1-No significant post stroke disability and can perform usual duties with stroke symptoms IV Thrombolysis: no, hemorrhage ICH Score: 2  NIHSS components Score: Comment  1a Level of Conscious 0[]  1[x]  2[]  3[]      1b LOC Questions 0[]  1[]  2[x]       1c LOC Commands 0[]  1[]  2[x]       2 Best Gaze 0[x]  1[]  2[]       3 Visual 0[x]  1[]  2[]  3[]      4 Facial Palsy 0[]  1[x]  2[]  3[]      5a Motor Arm - left 0[]  1[]   2[]  3[]  4[x]  UN[]    5b Motor Arm - Right 0[]  1[]  2[]  3[]  4[x]  UN[]    6a Motor Leg - Left 0[]  1[]  2[]  3[x]  4[]  UN[]    6b Motor Leg - Right 0[]  1[]  2[]  3[x]  4[]  UN[]    7 Limb Ataxia 0[x]  1[]  2[]  3[]  UN[]     8 Sensory 0[]  1[x]  2[]  UN[]      9 Best Language 0[]  1[]  2[]  3[x]      10 Dysarthria 0[]  1[]  2[x]  UN[]      11 Extinct. and Inattention 0[x]  1[]  2[]       TOTAL:  26      ROS  Unable to ascertain due to aphasia  Past History   Past Medical History:  Diagnosis Date   Alpha-1-antitrypsin deficiency (HCC)    per patient   Arthritis 2006   Asthma    Breast cancer (HCC) 10/14/2013   18 mm, T1c, N0; ER/ PR positive,her 2 neu overexpressed. Adjuvant chemo/ herceptin .   Cancer [facility]) 2006   Renal cell carcinoma; cryosurgery treatment, right side   Cirrhosis (HCC)    Collapsed lung 2007   Diabetes mellitus without complication (HCC) 2006   Metformin    Diffuse cystic mastopathy    Family history of breast cancer    Family history of liver cancer    Motion sickness    any moving vehicle   Orthodontics    braces   Personal history of chemotherapy    Sinus problem     Past Surgical History:  Procedure Laterality Date   BREAST  BIOPSY Right 2005   negative   BREAST BIOPSY Left 08/23/2007   negative   BREAST BIOPSY Left 10/24/2013   positive   BREAST BIOPSY Left 07/2002   neg   BREAST BIOPSY Left 08/23/2007   neg   BREAST SURGERY Left 10/2011   Cyst Aspirationapocrine metaplasia, ductal cells and bone cells, hypo-cellular   BREAST SURGERY Left 2009   ADH on stereotactic biopsy, 1.5 mm focus.   BREAST SURGERY Left 2003   fibrocystic changes with ductal hyperplasia without atypia.   BREAST SURGERY Left 04/11/2014   Removal of implant, debridement chest wall Dr.Coan   CESAREAN SECTION  1991   CHOLECYSTECTOMY  1990   COLONOSCOPY  08/26/2013   Deward Piedmont, M.D. normal.   CRYOABLATION  2005, 2010   CYST REMOVAL NECK  10/2011   Dr. Herminio   ESOPHAGOGASTRODUODENOSCOPY (EGD) WITH  PROPOFOL  N/A 01/16/2017   Procedure: ESOPHAGOGASTRODUODENOSCOPY (EGD) WITH PROPOFOL ;  Surgeon: Jinny Carmine, MD;  Location: [facility];  Service: Gastroenterology;  Laterality: N/A;  Diabetic - oral meds   INCISION AND DRAINAGE Left Dec 2015, Feb 2016   Dr Sueann   MASTECTOMY Left 11/24/2013   positive   PORTACATH PLACEMENT  11/24/2013   removal of portacath      Family History: Family History  Problem Relation Age of Onset   Diabetes Mother    Hypertension Mother    Emphysema Mother    Aortic aneurysm Mother    Hemachromatosis Father    Liver cancer Father    Liver disease Father    Gout Brother    Diabetes Brother        passed at age 26   Breast cancer Paternal Aunt 82   Liver cancer Paternal Uncle    Hemochromatosis Paternal Uncle    Ovarian cancer Maternal Grandmother        unk primary, metastatic cancer   Hemachromatosis Daughter    Alpha-1 antitrypsin deficiency Daughter    Other Daughter        familial dominant lipodystrophy   Healthy Son    Healthy Son     Social History  reports that she has never smoked. She has been exposed to tobacco smoke. She has never used smokeless tobacco. She reports that she does not currently use alcohol . She reports that she does not use drugs.  Allergies  Allergen Reactions   Doxycycline  Shortness Of Breath   Exemestane  Anaphylaxis   Floxin [Ofloxacin] Anaphylaxis   Gluten Meal Anaphylaxis   Levofloxacin Anaphylaxis, Diarrhea, Itching, Nausea Only, Other (See Comments), Palpitations, Shortness Of Breath, Swelling and Tinitus   Linezolid Anaphylaxis   Sesame Oil Anaphylaxis   Taxotere [Docetaxel] Anaphylaxis   Hydrocodone Itching   Invokana [Canagliflozin]    Metformin  And Related     Upset stomach and nausea.   Sulfa Antibiotics Other (See Comments)    blisters Skin blisters bilateral lower extremities Other reaction(s): UNKNOWN Other reaction(s): RASH    Codeine Rash   Penicillins Rash   Vancomycin  Rash     Medications   Current Facility-Administered Medications:    clevidipine  (CLEVIPREX ) infusion 0.5 mg/mL, 0-21 mg/hr, Intravenous, Continuous, Matthews Danielle HERO, MD, Last Rate: 4 mL/hr at 05/13/23 1240, 2 mg/hr at 05/13/23 1240   propofol  (DIPRIVAN ) 1000 MG/100ML infusion, 5-80 mcg/kg/min, Intravenous, Continuous, Danielle Wallace, RPH   sodium chloride  (hypertonic) 3 % solution, , Intravenous, Continuous, Matthews Danielle HERO, MD, Last Rate: 50 mL/hr at 05/13/23 1233, New Bag at 05/13/23 1233   sodium chloride  3% (  hypertonic) IV bolus 250 mL, 250 mL, Intravenous, Once, Matthews Danielle HERO, MD, Last Rate: 500 mL/hr at 05/13/23 1239, 250 mL at 05/13/23 1239   sodium chloride  flush (NS) 0.9 % injection 3 mL, 3 mL, Intravenous, Once, Mumma, Clotilda, MD  Current Outpatient Medications:    albuterol  (VENTOLIN  HFA) 108 (90 Base) MCG/ACT inhaler, Inhale 1-2 puffs into the lungs every 4 (four) hours as needed for wheezing or shortness of breath., Disp: 6.7 each, Rfl: 5   aspirin  EC 81 MG tablet, Take 1 tablet (81 mg total) by mouth daily. Swallow whole., Disp: , Rfl:    azithromycin  (ZITHROMAX ) 250 MG tablet, Use as directed for 5 days, Disp: 6 each, Rfl: 0   B COMPLEX-C PO, Take by mouth daily., Disp: , Rfl:    busPIRone  (BUSPAR ) 10 MG tablet, Take 1 tablet (10 mg total) by mouth 2 (two) times daily., Disp: 180 tablet, Rfl: 3   carisoprodol  (SOMA ) 350 MG tablet, Take 1 tablet (350 mg total) by mouth 4 (four) times daily as needed for muscle spasms. (Patient not taking: Reported on 04/15/2023), Disp: 60 tablet, Rfl: 0   chlorpheniramine-HYDROcodone (TUSSIONEX) 10-8 MG/5ML, Take 5 mLs by mouth every 12 (twelve) hours as needed., Disp: 140 mL, Rfl: 0   Continuous Glucose Sensor (DEXCOM G7 SENSOR) MISC, Apply 1 sensor to the skin every 10 days to monitor glucose for diabetes., Disp: 3 each, Rfl: 11   Cyanocobalamin  (VITAMIN B-12 IJ), Inject as directed every 30 (thirty) days., Disp: , Rfl:    diphenhydrAMINE   (BENADRYL ) 25 MG tablet, Take 25 mg by mouth every 6 (six) hours as needed., Disp: , Rfl:    DULoxetine  (CYMBALTA ) 20 MG capsule, Take 1 capsule (20 mg total) by mouth daily., Disp: 90 capsule, Rfl: 2   fluticasone  (FLONASE ) 50 MCG/ACT nasal spray, Place 1 spray into both nostrils daily., Disp: 16 g, Rfl: 3   furosemide  (LASIX ) 20 MG tablet, Take 1 tablet (20 mg total) by mouth daily., Disp: 90 tablet, Rfl: 3   glucose blood (ONETOUCH VERIO) test strip, Blood sugar testing done QD and as needed  E11.65, Disp: 100 each, Rfl: 12   insulin  degludec (TRESIBA  FLEXTOUCH) 100 UNIT/ML FlexTouch Pen, Inject 28 Units into the skin daily., Disp: , Rfl:    Insulin  Pen Needle (BD PEN NEEDLE NANO 2ND GEN) 32G X 4 MM MISC, USE AS DIRECTED TO INJECT TRESIBA  DAILY UNDER THE SKIN, Disp: 100 each, Rfl: 9   lactulose  (CHRONULAC ) 10 GM/15ML solution, TAKE 30 ML TWICE A DAY FOR CONSTIPATION AT A TIME, Disp: 5676 mL, Rfl: 2   LINZESS  290 MCG CAPS capsule, TAKE 1 CAPSULE BY MOUTH EVERY DAY BEFORE BREAKFAST, Disp: 90 capsule, Rfl: 1   MAGNESIUM PO, Take by mouth daily., Disp: , Rfl:    propranolol  (INDERAL ) 10 MG tablet, Take 1 tablet (10 mg total) by mouth 3 (three) times daily. If bp is greater than 140, Disp: 90 tablet, Rfl: 3   propranolol  ER (INDERAL  LA) 60 MG 24 hr capsule, Take 1 capsule (60 mg total) by mouth daily., Disp: 90 capsule, Rfl: 1   Semaglutide , 1 MG/DOSE, (OZEMPIC , 1 MG/DOSE,) 4 MG/3ML SOPN, INJECT 1 MG UNDER THE SKIN ONCE A WEEK, Disp: 6 mL, Rfl: 5   spironolactone  (ALDACTONE ) 25 MG tablet, Take 1 tablet (25 mg total) by mouth 2 (two) times daily., Disp: 180 tablet, Rfl: 1   traMADol  (ULTRAM ) 50 MG tablet, TAKE 1 TABLET (50 MG TOTAL) BY MOUTH 2 (TWO) TIMES DAILY AS  NEEDED FOR MODERATE PAIN OR SEVERE PAIN., Disp: 180 tablet, Rfl: 0   VITAMIN D  PO, Take 1,000 Units by mouth daily., Disp: , Rfl:   Vitals   Vitals:   2023/05/20 1253 05-20-2023 1259 2023/05/20 1300 May 20, 2023 1303  BP: 131/79 124/70 118/66  121/68  Pulse:  98 89   Resp:  (!) 24 (!) 23   Temp:      SpO2:  100% 100%   Weight:        Body mass index is 34.6 kg/m.  Physical Exam    Gen: patient lying in bed, lethargic, drooling with vomit around her mouth CV: extremities appear well-perfused Resp: normal WOB  Neurologic exam MS: lethargic, does not answer orientation questions or follow commands Speech: no intelligible speech CN: PERRL, blinks to threat bilat, EOMI, L facial droop, hearing intact to voice Motor: no movement against gravity RUE and RLE, no movement LUE and LLE Sensory: minimal withdrawal to noxious stimuli less on L than R Reflexes: 1+ symm with toes down bilat Coordination: UTA Gait: deferred   Labs/Imaging/Neurodiagnostic studies   CBC:  Recent Labs  Lab 05-20-23 1215  WBC 13.2*  NEUTROABS 9.7*  HGB 16.5*  HCT 47.2*  MCV 89.6  PLT 308   Basic Metabolic Panel:  Lab Results  Component Value Date   NA 132 (L) 2023/05/20   K 3.9 05/20/23   CO2 22 05-20-2023   GLUCOSE 295 (H) 05/20/23   BUN 16 2023/05/20   CREATININE 0.59 05-20-23   CALCIUM  9.4 05-20-2023   GFRNONAA >60 2023-05-20   GFRAA 112 01/18/2020   Lipid Panel:  Lab Results  Component Value Date   LDLCALC 70 01/18/2020   HgbA1c:  Lab Results  Component Value Date   HGBA1C 6.9 (H) 12/25/2022   Urine Drug Screen: No results found for: LABOPIA, COCAINSCRNUR, LABBENZ, AMPHETMU, THCU, LABBARB  Alcohol  Level     Component Value Date/Time   ETH <10 05-20-23 1215   INR  Lab Results  Component Value Date   INR 1.0 20-May-2023   APTT  Lab Results  Component Value Date   APTT 24 05-20-2023   AED levels: No results found for: PHENYTOIN, ZONISAMIDE, LAMOTRIGINE, LEVETIRACETA  CT Head without contrast(Personally reviewed): 1. Acute intraparenchymal hemorrhage in the left thalamus with intraventricular extension. No hydrocephalus. 2. Acute intraparenchymal hematoma in the right basal  ganglia, measuring up to 37 x 24 x 34 mm (15 mL). Mild mass effect on the right lateral ventricle. No significant midline shift.  CT angio Head and Neck with contrast(Personally reviewed): 1. Unchanged left thalamic hematoma with intraventricular extension. Unchanged right basal ganglia hematoma with mild mass effect on the right lateral ventricle. No midline shift. No spot sign to indicate active extravasation. 2. No intracranial large vessel occlusion, hemodynamically significant stenosis, aneurysm, or evidence of vascular malformation.  ASSESSMENT   CIANA SIMMON is a 63 y.o. female  has a past medical history of Alpha-1-antitrypsin deficiency (HCC), Arthritis (2006), Asthma, Breast cancer (HCC) (10/14/2013), Cancer (HCC) (2006), Cirrhosis (HCC), Collapsed lung (2007), Diabetes mellitus without complication (HCC) (2006), Diffuse cystic mastopathy, Family history of breast cancer, Family history of liver cancer, Motion sickness, Orthodontics, Personal history of chemotherapy, and Sinus problem. Who presented with headache and BP in 170s then had rapid deterioration with L hemiplegia and aphasia ultimately requiring intubation for airway protection. She was found to have both R BG hematoma and L thalamus ICH with I'VE. Etiology of both bleeds is suspected to be hypertensive given uncontrolled  HTN and location of hemorrhages. NO e/o aneurysm or vascular malformation on CTA. She has a hx cirrhosis but INR WNL. Not on anticoagulation.  RECOMMENDATIONS   - Admit to 4N neuro ICU under Dr. Vanessa - Clevidipine  for goal SBP <150 - SCDs for DVT prophylaxis - Head CT in 6 hrs assess stability of ICH - HOB elevated 30 degrees - Hypertonic saline 3% continuous PIV per protocol (incl 250cc bolus) - Consider neurosurgery involvement if patient develops hydrocephalus (not present at this time) - Further mgmt per admitting team at [facility].      This patient is critically ill and at significant  risk of neurological worsening, death and care requires constant monitoring of vital signs, hemodynamics,respiratory and cardiac monitoring, neurological assessment, discussion with family, other specialists and medical decision making of high complexity. I spent 75 minutes of neurocritical care time  in the care of  this patient. This was time spent independent of any time provided by nurse practitioner or PA.   Danielle Ross, MD [facility] [phone]   If 7pm- 7am, please page neurology on call as listed in AMION.

## 2023-05-13 NOTE — ED Triage Notes (Signed)
 All symptoms started this am with headache, n/v and she states high blood pressure and blood sugar at home BG was 280's Pt states that she did take an extra blood pressure pill at home (propanolol) BG 280 in triage. Pt took tylenol  325mg  at home for headache and all regular medications at home.

## 2023-05-13 NOTE — H&P (Signed)
 NEUROLOGY H&P NOTE   Date of service: May 13, 2023 Patient Name: Danielle Wallace MRN:  [id number] DOB:  06/29/1960 Chief Complaint: ICH  History of Present Illness  Danielle Wallace is a 63 y.o. female with PMH significant for Breat Cancer (2015), Renal cell Carcinoma (2006), s/p chemotherapy, DM, alpha-1 antitrypsin deficiency, asthma, arthritis who presented to [facility] due to  [facility] c/o headache. She was able to speak, walk, gave history to ED providers. However, he condition quickly, she was unable to speak and could not mover her left side. She was intubated for airway protection.   CODE STROKE was activated. On emergent CTH, acute IPH in left thalamus with I'VE without hydrocephalus and acute IPH in right basal ganglia with mild mass effect but no significant midline shift. CTA negative for AVM or aneurysm.   She was transferred to [facility] for [facility] management and admitted to the ICU.   Last known well: unclear, sometime this AM Modified rankin score: 0-Completely asymptomatic and back to baseline post- stroke ICH Score: 3 tNKASE: Not offered due to ICH Thrombectomy: not offered due to ICH NIHSS components Score: Comment  1a Level of Conscious 0[]  1[]  2[]  3[x]     sedated  1b LOC Questions 0[x]  1[]  2[]       1c LOC Commands 0[x]  1[]  2[]       2 Best Gaze 0[x]  1[]  2[]       3 Visual 0[x]  1[]  2[]  3[]      4 Facial Palsy 0[x]  1[]  2[]  3[]     UTA d/t ETT  5a Motor Arm - left 0[]  1[]  2[]  3[]  4[x]  UN[]   Extensive posturing effort   5b Motor Arm - Right 0[]  1[]  2[]  3[x]  4[]  UN[]   Lifts antigravity in response to pain, ?localization  6a Motor Leg - Left 0[]  1[]  2[]  3[x]  4[]  UN[]  Triple flexion  6b Motor Leg - Right 0[]  1[]  2[]  3[x]  4[]  UN[]  Extensor posturing effort   7 Limb Ataxia 0[]  1[]  2[]  3[]  UN[x]     8 Sensory 0[]  1[]  2[]  UN[x]      9 Best Language 0[]  1[]  2[]  3[x]      10 Dysarthria 0[]  1[]  2[]  UN[x]      11 Extinct. and Inattention 0[x]  1[]  2[]       TOTAL:       ROS   Unable to  perform due to AMS  Past History   Past Medical History:  Diagnosis Date   Alpha-1-antitrypsin deficiency (HCC)    per patient   Arthritis 2006   Asthma    Breast cancer (HCC) 10/14/2013   18 mm, T1c, N0; ER/ PR positive,her 2 neu overexpressed. Adjuvant chemo/ herceptin .   Cancer [facility]) 2006   Renal cell carcinoma; cryosurgery treatment, right side   Cirrhosis (HCC)    Collapsed lung 2007   Diabetes mellitus without complication (HCC) 2006   Metformin    Diffuse cystic mastopathy    Family history of breast cancer    Family history of liver cancer    Motion sickness    any moving vehicle   Orthodontics    braces   Personal history of chemotherapy    Sinus problem    Past Surgical History:  Procedure Laterality Date   BREAST BIOPSY Right 2005   negative   BREAST BIOPSY Left 08/23/2007   negative   BREAST BIOPSY Left 10/24/2013   positive   BREAST BIOPSY Left 07/2002   neg   BREAST BIOPSY Left 08/23/2007   neg   BREAST SURGERY Left  10/2011   Cyst Aspirationapocrine metaplasia, ductal cells and bone cells, hypo-cellular   BREAST SURGERY Left 2009   ADH on stereotactic biopsy, 1.5 mm focus.   BREAST SURGERY Left 2003   fibrocystic changes with ductal hyperplasia without atypia.   BREAST SURGERY Left 04/11/2014   Removal of implant, debridement chest wall Dr.Coan   CESAREAN SECTION  1991   CHOLECYSTECTOMY  1990   COLONOSCOPY  08/26/2013   Deward Piedmont, M.D. normal.   CRYOABLATION  2005, 2010   CYST REMOVAL NECK  10/2011   Dr. Herminio   ESOPHAGOGASTRODUODENOSCOPY (EGD) WITH PROPOFOL  N/A 01/16/2017   Procedure: ESOPHAGOGASTRODUODENOSCOPY (EGD) WITH PROPOFOL ;  Surgeon: Jinny Carmine, MD;  Location: [facility];  Service: Gastroenterology;  Laterality: N/A;  Diabetic - oral meds   INCISION AND DRAINAGE Left Dec 2015, Feb 2016   Dr Sueann   MASTECTOMY Left 11/24/2013   positive   PORTACATH PLACEMENT  11/24/2013   removal of portacath     Family History  Problem  Relation Age of Onset   Diabetes Mother    Hypertension Mother    Emphysema Mother    Aortic aneurysm Mother    Hemachromatosis Father    Liver cancer Father    Liver disease Father    Gout Brother    Diabetes Brother        passed at age 5   Breast cancer Paternal Aunt 35   Liver cancer Paternal Uncle    Hemochromatosis Paternal Uncle    Ovarian cancer Maternal Grandmother        unk primary, metastatic cancer   Hemachromatosis Daughter    Alpha-1 antitrypsin deficiency Daughter    Other Daughter        familial dominant lipodystrophy   Healthy Son    Healthy Son    Social History   Socioeconomic History   Marital status: Married    Spouse name: Not on file   Number of children: Not on file   Years of education: Not on file   Highest education level: Not on file  Occupational History   Not on file  Tobacco Use   Smoking status: Never    Passive exposure: Past   Smokeless tobacco: Never  Vaping Use   Vaping status: Never Used  Substance and Sexual Activity   Alcohol  use: Not Currently    Comment: none recently   Drug use: No   Sexual activity: Not on file  Other Topics Concern   Not on file  Social History Narrative   Not on file   Social Drivers of Health   Financial Resource Strain: Not on file  Food Insecurity: Not on file  Transportation Needs: Not on file  Physical Activity: Not on file  Stress: Not on file  Social Connections: Not on file   Allergies  Allergen Reactions   Doxycycline  Shortness Of Breath   Exemestane  Anaphylaxis   Floxin [Ofloxacin] Anaphylaxis   Gluten Meal Anaphylaxis   Levofloxacin Anaphylaxis, Diarrhea, Itching, Nausea Only, Other (See Comments), Palpitations, Shortness Of Breath, Swelling and Tinitus   Linezolid Anaphylaxis   Sesame Oil Anaphylaxis   Taxotere [Docetaxel] Anaphylaxis   Hydrocodone Itching   Invokana [Canagliflozin]    Metformin  And Related     Upset stomach and nausea.   Sulfa Antibiotics Other (See  Comments)    blisters Skin blisters bilateral lower extremities Other reaction(s): UNKNOWN Other reaction(s): RASH    Codeine Rash   Penicillins Rash   Vancomycin  Rash    Medications  Medications Prior to Admission  Medication Sig Dispense Refill Last Dose/Taking   albuterol  (VENTOLIN  HFA) 108 (90 Base) MCG/ACT inhaler Inhale 1-2 puffs into the lungs every 4 (four) hours as needed for wheezing or shortness of breath. 6.7 each 5    aspirin  EC 81 MG tablet Take 1 tablet (81 mg total) by mouth daily. Swallow whole.      azithromycin  (ZITHROMAX ) 250 MG tablet Use as directed for 5 days 6 each 0    B COMPLEX-C PO Take by mouth daily.      busPIRone  (BUSPAR ) 10 MG tablet Take 1 tablet (10 mg total) by mouth 2 (two) times daily. 180 tablet 3    carisoprodol  (SOMA ) 350 MG tablet Take 1 tablet (350 mg total) by mouth 4 (four) times daily as needed for muscle spasms. (Patient not taking: Reported on 04/15/2023) 60 tablet 0    chlorpheniramine-HYDROcodone (TUSSIONEX) 10-8 MG/5ML Take 5 mLs by mouth every 12 (twelve) hours as needed. 140 mL 0    Continuous Glucose Sensor (DEXCOM G7 SENSOR) MISC Apply 1 sensor to the skin every 10 days to monitor glucose for diabetes. 3 each 11    Cyanocobalamin  (VITAMIN B-12 IJ) Inject as directed every 30 (thirty) days.      diphenhydrAMINE  (BENADRYL ) 25 MG tablet Take 25 mg by mouth every 6 (six) hours as needed.      DULoxetine  (CYMBALTA ) 20 MG capsule Take 1 capsule (20 mg total) by mouth daily. 90 capsule 2    fluticasone  (FLONASE ) 50 MCG/ACT nasal spray Place 1 spray into both nostrils daily. 16 g 3    furosemide  (LASIX ) 20 MG tablet Take 1 tablet (20 mg total) by mouth daily. 90 tablet 3    glucose blood (ONETOUCH VERIO) test strip Blood sugar testing done QD and as needed  E11.65 100 each 12    insulin  degludec (TRESIBA  FLEXTOUCH) 100 UNIT/ML FlexTouch Pen Inject 28 Units into the skin daily.      Insulin  Pen Needle (BD PEN NEEDLE NANO 2ND GEN) 32G X 4 MM  MISC USE AS DIRECTED TO INJECT TRESIBA  DAILY UNDER THE SKIN 100 each 9    lactulose  (CHRONULAC ) 10 GM/15ML solution TAKE 30 ML TWICE A DAY FOR CONSTIPATION AT A TIME 5676 mL 2    LINZESS  290 MCG CAPS capsule TAKE 1 CAPSULE BY MOUTH EVERY DAY BEFORE BREAKFAST 90 capsule 1    MAGNESIUM PO Take by mouth daily.      propranolol  (INDERAL ) 10 MG tablet Take 1 tablet (10 mg total) by mouth 3 (three) times daily. If bp is greater than 140 90 tablet 3    propranolol  ER (INDERAL  LA) 60 MG 24 hr capsule Take 1 capsule (60 mg total) by mouth daily. 90 capsule 1    Semaglutide , 1 MG/DOSE, (OZEMPIC , 1 MG/DOSE,) 4 MG/3ML SOPN INJECT 1 MG UNDER THE SKIN ONCE A WEEK 6 mL 5    spironolactone  (ALDACTONE ) 25 MG tablet Take 1 tablet (25 mg total) by mouth 2 (two) times daily. 180 tablet 1    traMADol  (ULTRAM ) 50 MG tablet TAKE 1 TABLET (50 MG TOTAL) BY MOUTH 2 (TWO) TIMES DAILY AS NEEDED FOR MODERATE PAIN OR SEVERE PAIN. 180 tablet 0    VITAMIN D  PO Take 1,000 Units by mouth daily.        Vitals   There were no vitals filed for this visit.   There is no height or weight on file to calculate BMI.  Physical Exam   Constitutional: Appears  critically ill. Sedated.  Psych: UTA Eyes: No scleral injection.  HENT: No OP obstruction. OGT in place Head: Normocephalic.  Cardiovascular: Normal rate and regular rhythm.  Respiratory: Mechanically ventilated  Skin: various bruising noted BLE.   Neurologic Examination    Patient in intubated and sedated.  She does not open eyes to voice. She does not follow commands. Pupils small but reactive. Does not track.  Weak corneals bilaterally. Cough and gag reflexes intact.   Motor/Sensory: RUE: possible localizing to pain with antigravity strength seen inconsistently LUE: Extensor posturing in response to noxious stimuli RLE: Triple flexion in response to noxious stimuli LLE: Extensor posturing seen in response to noxious stimuli   Labs   CBC:  Recent Labs  Lab  05/13/23 1215  WBC 13.2*  NEUTROABS 9.7*  HGB 16.5*  HCT 47.2*  MCV 89.6  PLT 308    Basic Metabolic Panel:  Lab Results  Component Value Date   NA 132 (L) 05/13/2023   K 3.9 05/13/2023   CO2 22 05/13/2023   GLUCOSE 295 (H) 05/13/2023   BUN 16 05/13/2023   CREATININE 0.59 05/13/2023   CALCIUM  9.4 05/13/2023   GFRNONAA >60 05/13/2023   GFRAA 112 01/18/2020   Lipid Panel:  Lab Results  Component Value Date   LDLCALC 70 01/18/2020   HgbA1c:  Lab Results  Component Value Date   HGBA1C 6.9 (H) 12/25/2022   Urine Drug Screen: No results found for: LABOPIA, COCAINSCRNUR, LABBENZ, AMPHETMU, THCU, LABBARB  Alcohol  Level     Component Value Date/Time   ETH <10 05/13/2023 1215   INR  Lab Results  Component Value Date   INR 1.0 05/13/2023   APTT  Lab Results  Component Value Date   APTT 24 05/13/2023     CT Head without contrast(Personally reviewed): - Acute intraparenchymal hemorrhage left thalamus with I'VE. No hydrocephalus - Acute intraparenchymal hematoma right basal ganglia, measuring 15ml. Mild mass effect on right lateral ventricle. No significant midline shift.   CT angio Head and Neck with contrast(Personally reviewed): - No spot sign to indicate extravasation. - no intracranial LVO, aneurysm, or AVM.    Impression   Danielle Wallace is a 63 y.o. female  has a past medical history of Alpha-1-antitrypsin deficiency (HCC), Arthritis (2006), Asthma, Breast cancer (HCC) (10/14/2013), Cancer (HCC) (2006), Cirrhosis (HCC), Collapsed lung (2007), Diabetes mellitus without complication (HCC) (2006), Diffuse cystic mastopathy, Family history of breast cancer, Family history of liver cancer, Motion sickness, Orthodontics, Personal history of chemotherapy, and Sinus problem.  Presented with headache and BP in 170s then had rapid deterioration with L hemiplegia and aphasia ultimately requiring intubation for airway protection. She was found to have both R  BG hematoma and L thalamus ICH with IVE. Etiology of both bleeds is suspected to be hypertensive given uncontrolled HTN and location of hemorrhages. No e/o aneurysm or vascular malformation on CTA. She has a hx cirrhosis but INR WNL. Not on anticoagulation.   Discussed case with neurosurgery. No intervention at this time.   Primary Diagnosis:  Nontraumatic intracerebral hemorrhage in hemisphere, subcortical   Recommendations  - Repeat CTH @ 1600 to assess ICH - SBP goal <160 - Cleviprex  gtt as needed - Increase Hypertonic saline 3% continuous @ 21ml/hr - Goal Na 150-155 - Na checks q6H - Labs: Lipid panel, HgbAic, daily CBC/CMP/PT/INR - Stroke workup to include: TTE, Carotid Doppler, TCD per Stroke team - Critical care consult for ventilator mgmt, appreciate their assistance. - Neurosurgery consulted for potential EVD.  Discussed over phone. They do not do prophylactic EVD per NSGY. Mentioned concerns about noted IVH in third ventricle limiting outflow from both lateral ventricles. ______________________________________________________________________   Pt seen by Neuro NP/APP and later by MD. Note/plan to be edited by MD as needed.    Rocky JAYSON Likes, DNP, AGACNP-BC [facility] Please use AMION for contact information & EPIC for messaging.   NEUROHOSPITALIST ADDENDUM Performed a face to face diagnostic evaluation.   I have reviewed the contents of history and physical exam as documented by PA/ARNP/Resident and agree with above documentation.  I have discussed and formulated the above plan as documented. Edits to the note have been made as needed.  This patient is critically ill and at significant risk of neurological worsening, death and care requires constant monitoring of vital signs, hemodynamics,respiratory and cardiac monitoring, neurological assessment, discussion with family, other specialists and medical decision making of high complexity. I spent 50 minutes of  neurocritical care time  in the care of  this patient. This was time spent independent of any time provided by nurse practitioner or PA.  Balin Vandegrift [facility] 05/13/2023  4:44 PM  Dejay Kronk, MD [facility] [phone]   If 7pm to 7am, please call on call as listed on AMION.

## 2023-05-13 NOTE — Procedures (Signed)
 Arterial Line Insertion Start/End1/05/2023 5:05 PM, 05/13/2023 5:15 PM  Patient location: ICU. Preanesthetic checklist: patient identified, risks and benefits discussed, surgical consent, monitors and equipment checked and timeout performed Right, radial was placed Catheter size: 20 G Hand hygiene performed  and maximum sterile barriers used  Allen's test indicative of satisfactory collateral circulation Attempts: 1 Procedure performed without using ultrasound guided technique. Following insertion, dressing applied and Biopatch. Post procedure assessment: unchanged  Patient tolerated the procedure well with no immediate complications.  Alarms Checked

## 2023-05-13 NOTE — ED Notes (Addendum)
 Pt family up to nurses desk stating pt is not able to talk and is slurring speech. Cbg rechecked 309. Pt noted to have slurred speech and AMS. Code stroke called. Pt to CT 1

## 2023-05-13 NOTE — Progress Notes (Signed)
   05/13/23 1200  Spiritual Encounters  Type of Visit Initial  Care provided to: [facility] partners present during encounter Physician  Referral source Code page  Reason for visit Code  OnCall Visit Yes  Spiritual Framework  Presenting Themes Coping tools  Family Stress Factors Major life changes   Chaplain responded to Code Stroke and encountered husband waiting for wife in ED 73. Chaplain maintained compassionate presence as care team shared that wife is in critical condition. Chaplain remained with husband and escorted him to family consult room. Husband indicated that he needed to make some phone calls and was fine for the time being. Chaplain services available upon request.

## 2023-05-13 NOTE — ED Notes (Signed)
 First nurse came up to this RN and stated that pt was now a code stroke and to bring pt to CT.  Pt at triage was responsive, ambulatory, and was able to answer questions. The pt that was being presented to this RN to bring to CT was no longer responsive and ambulatory--condition greatly changed from triage time. Pt brought to CT, husband to wait in family room and updates were given by Dr. Suzanne and Neurologist as well.   After pt was intubated, and made comfortable, pt's husband was brought into room to be with pt.

## 2023-05-13 NOTE — ED Notes (Signed)
 Pt  going to  [facility]   [address]  bed  21

## 2023-05-13 NOTE — Progress Notes (Signed)
 CODE STROKE- PHARMACY COMMUNICATION   Time CODE STROKE called/page received: 1207  Time response to CODE STROKE was made (in person or via phone): In-person  Time Stroke Kit retrieved from Pyxis (only if needed): TNK not given - ICH on imaging  Name of Provider/Nurse contacted: Dr. Matthews  Past Medical History:  Diagnosis Date   Alpha-1-antitrypsin deficiency (HCC)    per patient   Arthritis 2006   Asthma    Breast cancer (HCC) 10/14/2013   18 mm, T1c, N0; ER/ PR positive,her 2 neu overexpressed. Adjuvant chemo/ herceptin .   Cancer [facility]) 2006   Renal cell carcinoma; cryosurgery treatment, right side   Cirrhosis (HCC)    Collapsed lung 2007   Diabetes mellitus without complication (HCC) 2006   Metformin    Diffuse cystic mastopathy    Family history of breast cancer    Family history of liver cancer    Motion sickness    any moving vehicle   Orthodontics    braces   Personal history of chemotherapy    Sinus problem    Prior to Admission medications   Medication Sig Start Date End Date Taking? Authorizing Provider  albuterol  (VENTOLIN  HFA) 108 (90 Base) MCG/ACT inhaler Inhale 1-2 puffs into the lungs every 4 (four) hours as needed for wheezing or shortness of breath. 01/27/23   Liana Fish, NP  aspirin  EC 81 MG tablet Take 1 tablet (81 mg total) by mouth daily. Swallow whole. 03/01/21   Darliss Rogue, MD  azithromycin  (ZITHROMAX ) 250 MG tablet Use as directed for 5 days 04/27/23   Liana Fish, NP  B COMPLEX-C PO Take by mouth daily.    [provider]  busPIRone  (BUSPAR ) 10 MG tablet Take 1 tablet (10 mg total) by mouth 2 (two) times daily. 03/17/23   Liana Fish, NP  carisoprodol  (SOMA ) 350 MG tablet Take 1 tablet (350 mg total) by mouth 4 (four) times daily as needed for muscle spasms. Patient not taking: Reported on 04/15/2023 02/09/23   Abernathy, Alyssa, NP  chlorpheniramine-HYDROcodone (TUSSIONEX) 10-8 MG/5ML Take 5 mLs by mouth every 12  (twelve) hours as needed. 05/12/23   Liana Fish, NP  Continuous Glucose Sensor (DEXCOM G7 SENSOR) MISC Apply 1 sensor to the skin every 10 days to monitor glucose for diabetes. 01/27/23   Liana Fish, NP  Cyanocobalamin  (VITAMIN B-12 IJ) Inject as directed every 30 (thirty) days.    [provider]  diphenhydrAMINE  (BENADRYL ) 25 MG tablet Take 25 mg by mouth every 6 (six) hours as needed.    [provider]  DULoxetine  (CYMBALTA ) 20 MG capsule Take 1 capsule (20 mg total) by mouth daily. 01/27/23   Liana Fish, NP  fluticasone  (FLONASE ) 50 MCG/ACT nasal spray Place 1 spray into both nostrils daily. 01/21/22   Khan, Fozia M, MD  furosemide  (LASIX ) 20 MG tablet Take 1 tablet (20 mg total) by mouth daily. 03/17/23   Liana Fish, NP  glucose blood (ONETOUCH VERIO) test strip Blood sugar testing done QD and as needed  E11.65 03/12/21   McDonough, Lauren K, PA-C  insulin  degludec (TRESIBA  FLEXTOUCH) 100 UNIT/ML FlexTouch Pen Inject 28 Units into the skin daily. 01/27/23   Liana Fish, NP  Insulin  Pen Needle (BD PEN NEEDLE NANO 2ND GEN) 32G X 4 MM MISC USE AS DIRECTED TO INJECT TRESIBA  DAILY UNDER THE SKIN 12/30/22   Liana Fish, NP  lactulose  (CHRONULAC ) 10 GM/15ML solution TAKE 30 ML TWICE A DAY FOR CONSTIPATION AT A TIME 10/17/22   Khan, Fozia  M, MD  LINZESS  290 MCG CAPS capsule TAKE 1 CAPSULE BY MOUTH EVERY DAY BEFORE BREAKFAST 12/16/22   Abernathy, Alyssa, NP  MAGNESIUM PO Take by mouth daily.    [provider]  propranolol  (INDERAL ) 10 MG tablet Take 1 tablet (10 mg total) by mouth 3 (three) times daily. If bp is greater than 140 10/02/21   Khan, Fozia M, MD  propranolol  ER (INDERAL  LA) 60 MG 24 hr capsule Take 1 capsule (60 mg total) by mouth daily. 01/27/23   Abernathy, Mardy, NP  Semaglutide , 1 MG/DOSE, (OZEMPIC , 1 MG/DOSE,) 4 MG/3ML SOPN INJECT 1 MG UNDER THE SKIN ONCE A WEEK 06/09/22   Liana Mardy, NP  spironolactone  (ALDACTONE ) 25 MG  tablet Take 1 tablet (25 mg total) by mouth 2 (two) times daily. 01/27/23   Liana Mardy, NP  traMADol  (ULTRAM ) 50 MG tablet TAKE 1 TABLET (50 MG TOTAL) BY MOUTH 2 (TWO) TIMES DAILY AS NEEDED FOR MODERATE PAIN OR SEVERE PAIN. 05/07/23   Liana Mardy, NP  VITAMIN D  PO Take 1,000 Units by mouth daily.    [provider]    Damien Napoleon ,PharmD Clinical Pharmacist  05/13/2023  12:25 PM

## 2023-05-13 NOTE — ED Notes (Signed)
[facility]  called  per  Dr  Mumma  MD 

## 2023-05-13 NOTE — ED Notes (Signed)
 Code  stroke  called  to   [facility]  at  12:00 pm

## 2023-05-14 DIAGNOSIS — I610 Nontraumatic intracerebral hemorrhage in hemisphere, subcortical: Secondary | ICD-10-CM

## 2023-05-14 MED ORDER — FUROSEMIDE 10 MG/ML IJ SOLN
40.0000 mg | Freq: Once | INTRAMUSCULAR | Status: AC
  Administered 2023-05-14: 40 mg via INTRAVENOUS
  Filled 2023-05-14: qty 4

## 2023-05-14 NOTE — Progress Notes (Signed)
 NT suctioned pt. Per MD order per RN.

## 2023-05-14 NOTE — Progress Notes (Signed)
   05/14/23 1000  Spiritual Encounters  Type of Visit Follow up  Care provided to: Family  Referral source Chaplain team  Reason for visit Routine spiritual support  OnCall Visit No   Chaplain followed up with family. Last night, they had called the [employer] crises line and were able to get someone to come perform the Last Rites. Chaplain facilitated communication between family and member of their faith community. No follow-up needed at this time.

## 2023-05-14 NOTE — Progress Notes (Signed)
 Patient transferred on the floor at 1515 PM via bed. Not responsive. On RA. Will continue comfort care

## 2023-05-14 NOTE — Progress Notes (Signed)
 PCCM Brief Note  Discussed with RN. Pt transitioned to comfort measures overnight with compassionate extubation. Currently on Morphine  infusion, comfortable.  Will d/c remaining orders including insulin , protonix . No additional needs.  Family at bedside.   No charge.   Sammi Gore, PA - [facility] For pager details, please see AMION or use Epic chat  After 1900, please call [facility] for cross coverage needs 05/14/2023, 7:42 AM

## 2023-05-14 NOTE — Progress Notes (Signed)
 PCCM Brief Note  Received message from RN that family requesting Lasix  as they feel it will help with secretion burden. 1 time 40mg  Lasix  ordered.  RN to update stroke/primary team for further needs.   Sammi Gore, PA - [facility] For pager details, please see AMION or use Epic chat  After 1900, please call [facility] for cross coverage needs 05/14/2023, 4:44 PM

## 2023-05-14 NOTE — Progress Notes (Addendum)
 STROKE TEAM PROGRESS NOTE  History of present illness: Patient is a 63 year old female with past medical history significant for breast cancer in 2015, renal cell carcinoma in 2006, diabetes mellitus, alpha 1 antitrypsin deficiency, asthma, and arthritis.  She presented to [facility] with complaints of headache.  She was not able to speak, walk, or give history.  Her condition then further deteriorated and she was unable to speak or move her left side.  She was intubated for airway protection.  A code stroke was initiated.  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  No AVM or aneurysms noted.  She was transferred to the ICU.  She has since been made comfort care.  INTERVAL HISTORY Her family is at the bedside.  Repeat CT scan showed increase in hemorrhage and cytotoxic edema and midline shift.  Family understood poor prognosis and transition her to DNR and full comfort care measures.  She is on morphine  drip at 10 mg/h.  She is resting comfortably with her eyes closed.  Answered questions for family regarding end-of-life and expectations.  Vitals:   05/14/23 1203 05/14/23 1204 05/14/23 1304 05/14/23 1403  BP: 104/60 104/60 115/60 116/62  Pulse: (!) 115 (!) 116 (!) 116 (!) 114  Resp: (!) 7 (!) 7 (!) 6 (!) 6  Temp:  99 F (37.2 C)    TempSrc:  Axillary    SpO2: (!) 80% (!) 81% (!) 80% (!) 88%   CBC:  Recent Labs  Lab 05/13/23 1215 05/13/23 1603  WBC 13.2*  --   NEUTROABS 9.7*  --   HGB 16.5* 14.3  HCT 47.2* 42.0  MCV 89.6  --   PLT 308  --    Basic Metabolic Panel:  Recent Labs  Lab 05/13/23 1215 05/13/23 1516 05/13/23 1603  NA 132* 141 143  K 3.9  --  4.2  CL 99  --   --   CO2 22  --   --   GLUCOSE 295*  --   --   BUN 16  --   --   CREATININE 0.59  --   --   CALCIUM  9.4  --   --    Lipid Panel:  Recent Labs  Lab 05/13/23 1516  CHOL 194  TRIG 184*  HDL 47   CHOLHDL 4.1  VLDL 37  LDLCALC 889*   HgbA1c: No results for input(s): HGBA1C in the last 168 hours. Urine Drug Screen: No results for input(s): LABOPIA, COCAINSCRNUR, LABBENZ, AMPHETMU, THCU, LABBARB in the last 168 hours.  Alcohol  Level  Recent Labs  Lab 05/13/23 1516  ETH <10    IMAGING past 24 hours CT HEAD WO CONTRAST Result Date: 05/13/2023 CLINICAL DATA:  Hemorrhagic stroke, follow-up. EXAM: CT HEAD WITHOUT CONTRAST TECHNIQUE: Contiguous axial images were obtained from the base of the skull through the vertex without intravenous contrast. RADIATION DOSE REDUCTION: This exam was performed according to the departmental dose-optimization program which includes automated exposure control, adjustment of the mA and/or kV according to patient size and/or use of iterative reconstruction technique. COMPARISON:  Head CT 05/13/2023 at 1208 hours. FINDINGS: Brain: Markedly increased size of the right basal ganglia hematoma, now measuring up to 6.5 x 2.7 x 4.5 cm (40 mL), previously 3.7 x 2.4 x 3.4 cm. New acute subarachnoid hemorrhage along the left frontal convexity. Slightly increased size of the left thalamic hematoma with intraventricular extension into the  third ventricle, now measuring up to 2.7 x 2.5 cm (axial image 16 series 2), previously 2.0 x 2.4 cm. New dilation of the left-greater-than-right lateral ventricles, consistent with obstruction of ventricular outflow at the level of the third ventricle. New 6 mm of leftward midline shift. Vascular: No hyperdense vessel or unexpected calcification. Skull: No calvarial fracture or suspicious bone lesion. Skull base is unremarkable. Sinuses/Orbits: No acute finding. Other: None. IMPRESSION: 1. Markedly increased size of the right basal ganglia hematoma, now measuring up to 6.5 x 2.7 x 4.5 cm (40 mL), previously 3.7 x 2.4 x 3.4 cm. 2. New acute subarachnoid hemorrhage along the left frontal convexity, possibly redistributed  intraventricular hemorrhage. 3. Slightly increased size of the left thalamic hematoma with intraventricular extension into the third ventricle, now measuring up to 2.7 x 2.5 cm, previously 2.0 x 2.4 cm. 4. New dilation of the left-greater-than-right lateral ventricles, consistent with obstruction of ventricular outflow at the level of the third ventricle. 5. New 6 mm of leftward midline shift. These results were discussed by telephone on 05/13/2023 at 4:45 pm to provider Dr. Vanessa Who verbally acknowledged these results. Electronically Signed   By: Ryan Chess M.D.   On: 05/13/2023 17:08    PHYSICAL EXAM Constitutional-critically ill.  Sedated on intravenous morphine . Respiratory-unlabored and slow respirations.  Equal chest rise. HEENT-atraumatic.  Normocephalic.  Neuro- Patient unresponsive.  Eyes closed.  Pupils are reactive bilaterally.  No response to sternal rub.  No motor movement noted except for upgoing toes bilaterally. Sedated on 10 mg/h of morphine  IV.  ASSESSMENT/PLAN Ms. EMMETT ARNTZ is a 63 y.o. female with history of breast and renal cell cancer, DM, and HTN presenting with ICH.   Hypertensive ICH- R basal banglia. L thalamic ICH with intraventricular extension. ICH score 3 Patient is comfort care and family would like her transferred to [facility] in [place].  Social work to assist with this transfer. Transfer to floor out of ICU orders were written.   Hospital day # 1  Jackolyn JULIANNA Chute, NP  STROKE MD NOTE :  I have personally obtained history,examined this patient, reviewed notes, independently viewed imaging studies, participated in medical decision making and plan of care.ROS completed by me personally and pertinent positives fully documented  I have made any additions or clarifications directly to the above note. Agree with note above.  Patient with history of hypertension presented with headache and rapid deterioration of neurological exam with  unresponsiveness and CT scan showed acute left thalamic and right basal ganglia hemorrhage with mild cytotoxic edema.  She was intubated for airway protection but subsequent repeat CT scan several hours later shows worsening of hematoma and cytotoxic edema.  Patient neurological exam is quite poor and prognosis of surviving this and making meaningful improvement is quite low.  Family understands poor prognosis and agreed to DNR and full comfort care measures.  Continue morphine  drip for comfort and patient will be extubated terminally.  Discussed with critical care team and patient's family at the bedside and answered questions. This patient is critically ill and at significant risk of neurological worsening, death and care requires constant monitoring of vital signs, hemodynamics,respiratory and cardiac monitoring, extensive review of multiple databases, frequent neurological assessment, discussion with family, other specialists and medical decision making of high complexity.I have made any additions or clarifications directly to the above note.This critical care time does not reflect procedure time, or teaching time or supervisory time of PA/NP/Med Resident etc but could involve care discussion  time.  I spent 30 minutes of neurocritical care time  in the care of  this patient.      Eather Popp, MD Medical Director [facility] Pager: [phone] 05/14/2023 4:44 PM  To contact Stroke Continuity provider, please refer to [email or web address]. After hours, contact General Neurology

## 2023-05-14 NOTE — Progress Notes (Signed)
 Report called to 2 Chad. Social worker consulted for possible transfer to [employer]. Family at bedside updated in plan of care.

## 2023-05-14 NOTE — TOC CM/SW Note (Signed)
 Transition of Care [facility]) - Inpatient Brief Assessment   Patient Details  Name: Danielle Wallace MRN: [id number] Date of Birth: 1960/10/22  Transition of Care [facility]) CM/SW Contact:    Inocente GORMAN Kindle, LCSW Phone Number: 05/14/2023, 12:16 PM   Clinical Narrative: Patient has transitioned to comfort care. CSW received request from RN stating family would like patient to be evaluated by [facility]. CSW sent request to [facility] for review. Patient to be transferred to 2W per RN.   Transition of Care Asessment: Insurance and Status: Insurance coverage has been reviewed Patient has primary care physician: Yes Home environment has been reviewed: From home Prior level of function:: Independent Prior/Current Home Services: No current home services Social Drivers of Health Review: SDOH reviewed no interventions necessary Readmission risk has been reviewed: Yes Transition of care needs: transition of care needs identified, TOC will continue to follow

## 2023-05-14 NOTE — Progress Notes (Signed)
   05/13/23 2115  Spiritual Encounters  Type of Visit Follow up  Conversation partners present during encounter Nurse  Referral source Chaplain team  Reason for visit Routine spiritual support  OnCall Visit Yes   Follow-up on first shift chaplain visit. Spoke with nurse. Advised attempted to reach Skene a second time, no response, message left. Family wants Cathern to perform last rites. Advise unable to provide this evening and perhaps Cathern will respond to message in the morning. Nurse relayed information to family.

## 2023-05-15 DIAGNOSIS — I610 Nontraumatic intracerebral hemorrhage in hemisphere, subcortical: Secondary | ICD-10-CM | POA: Diagnosis not present

## 2023-05-18 ENCOUNTER — Telehealth: Payer: Self-pay | Admitting: Neurology

## 2023-05-18 NOTE — Telephone Encounter (Signed)
 [employer] called asking for Dr. Pearlean Brownie to log on to [facility] to sign pt's death certificate so they can process her cremation. They gave me a case ID # of [id number].

## 2023-05-19 ENCOUNTER — Ambulatory Visit: Payer: BC Managed Care – PPO | Admitting: Rheumatology

## 2023-05-28 DIAGNOSIS — G4733 Obstructive sleep apnea (adult) (pediatric): Secondary | ICD-10-CM | POA: Diagnosis not present

## 2023-06-04 ENCOUNTER — Ambulatory Visit: Payer: BC Managed Care – PPO

## 2023-06-11 ENCOUNTER — Ambulatory Visit: Payer: BC Managed Care – PPO | Admitting: Nurse Practitioner

## 2023-06-13 NOTE — Progress Notes (Signed)
 Patient expired at 0919 am. Family at bed side. No respiration. No pulse. MD notified after verified with 2 RNs. Primary and Teacher, English as a foreign language.

## 2023-06-13 NOTE — Progress Notes (Signed)
 [facility] 2W07 [facility] Liaison Note  Received request from [facility] manager for family interest in [facility] in [place]. Eligibility confirmed. Met with family to confirm interest and explain services. Family agreeable to transfer today. TOC aware. RN please call report to [phone] prior to patient leaving the unit. Please send signed DNR with patient at discharge.   Thank you for allowing us  to participate in this patient's care.  Eleanor Nail, LPN [facility] Liaison [phone]

## 2023-06-13 NOTE — Death Summary Note (Addendum)
  Patient ID: BEDA DULA MRN: [id number] DOB/AGE: [birth year] 63 y.o.  Admit date: 24-May-2023 Death date: 2023-05-26 0919  Admission Diagnoses: Intracerebral Hemorrhage-left thalamus, right basal ganglia and intraventricular Hydrocephalus Cytotoxic edema Transfalcine Brain herniation Uncontrolled hypertension  Cause of Death:   Intracerebral Hemorrhage with intraventricular extension and obstructive hydrocephalus  Pertinent Medical Diagnosis: Principal Problem:   ICH (intracerebral hemorrhage) [facility])   Hospital Course:  Patient is a 63 year old female with past medical history significant for breast cancer in Apr 28, 2014, renal cell carcinoma in 04-28-2005, diabetes mellitus, alpha 1 antitrypsin deficiency, asthma, and arthritis. She presented to [facility] with complaints of headache. She was able to speak, walk, or give history. Her condition then further deteriorated and she was unable to speak or move her left side. She was intubated for airway protection. A code stroke was initiated. 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 No AVM or aneurysms noted. She was transferred to [facility] ICU. Patient was placed on 3% hypertonic saline. Neurosurgery was contacted, no intervention at this time per consult.  Repeat CTH, approximately 4 hours later,  showed marked increase in size of right basal ganglia hematoma, new subarachnoid hemorrhage along left frontal convexity, slight increase in size of left thalamic hematoma with IVE into the third ventricle, new dilation of lateral ventricles, which was consistent with obstruction of ventricular outflow at level of third ventricle, and a new 6mm leftward midline shift. Blood pressure was controlled throughput with no need for medications.  Thorough discussions were held with patient's family, and she was transitioned to comfort care per their  wishes.   Signed:  Rocky JAYSON Likes, DNP, AGACNP-BC [facility] Please use AMION for contact information & EPIC for messaging.  05/26/2023, 11:34 AM

## 2023-06-13 NOTE — Plan of Care (Signed)
  Problem: Coping: Goal: Level of anxiety will decrease Outcome: Progressing   Problem: Pain Management: Goal: General experience of comfort will improve Outcome: Progressing

## 2023-06-13 DEATH — deceased

## 2023-07-09 ENCOUNTER — Ambulatory Visit: Payer: BC Managed Care – PPO

## 2023-08-06 ENCOUNTER — Ambulatory Visit: Payer: BC Managed Care – PPO

## 2023-09-03 ENCOUNTER — Ambulatory Visit: Payer: BC Managed Care – PPO

## 2023-10-01 ENCOUNTER — Ambulatory Visit: Payer: BC Managed Care – PPO

## 2024-01-25 ENCOUNTER — Ambulatory Visit: Payer: BC Managed Care – PPO | Admitting: Physician Assistant

## 2024-02-02 ENCOUNTER — Encounter: Payer: BC Managed Care – PPO | Admitting: Nurse Practitioner
